# Patient Record
Sex: Female | Born: 1935 | Race: White | Hispanic: No | Smoking: Never smoker
Health system: Southern US, Community
[De-identification: ages and names within clinical notes are randomized; demographics above are authoritative.]

## PROBLEM LIST (undated history)

## (undated) DIAGNOSIS — K279 Peptic ulcer, site unspecified, unspecified as acute or chronic, without hemorrhage or perforation: Secondary | ICD-10-CM

## (undated) DIAGNOSIS — D126 Benign neoplasm of colon, unspecified: Secondary | ICD-10-CM

## (undated) DIAGNOSIS — Z7901 Long term (current) use of anticoagulants: Secondary | ICD-10-CM

## (undated) DIAGNOSIS — Z9289 Personal history of other medical treatment: Secondary | ICD-10-CM

## (undated) DIAGNOSIS — M159 Polyosteoarthritis, unspecified: Secondary | ICD-10-CM

## (undated) DIAGNOSIS — K449 Diaphragmatic hernia without obstruction or gangrene: Secondary | ICD-10-CM

## (undated) DIAGNOSIS — R413 Other amnesia: Secondary | ICD-10-CM

## (undated) DIAGNOSIS — M810 Age-related osteoporosis without current pathological fracture: Secondary | ICD-10-CM

## (undated) DIAGNOSIS — Z87442 Personal history of urinary calculi: Secondary | ICD-10-CM

## (undated) DIAGNOSIS — IMO0002 Reserved for concepts with insufficient information to code with codable children: Secondary | ICD-10-CM

## (undated) DIAGNOSIS — I4510 Unspecified right bundle-branch block: Secondary | ICD-10-CM

## (undated) DIAGNOSIS — O288 Other abnormal findings on antenatal screening of mother: Secondary | ICD-10-CM

## (undated) DIAGNOSIS — K222 Esophageal obstruction: Secondary | ICD-10-CM

## (undated) DIAGNOSIS — I10 Essential (primary) hypertension: Secondary | ICD-10-CM

## (undated) DIAGNOSIS — R609 Edema, unspecified: Secondary | ICD-10-CM

## (undated) DIAGNOSIS — R001 Bradycardia, unspecified: Secondary | ICD-10-CM

## (undated) DIAGNOSIS — D649 Anemia, unspecified: Secondary | ICD-10-CM

## (undated) DIAGNOSIS — N189 Chronic kidney disease, unspecified: Secondary | ICD-10-CM

## (undated) DIAGNOSIS — I480 Paroxysmal atrial fibrillation: Secondary | ICD-10-CM

## (undated) DIAGNOSIS — R809 Proteinuria, unspecified: Secondary | ICD-10-CM

## (undated) DIAGNOSIS — R197 Diarrhea, unspecified: Secondary | ICD-10-CM

## (undated) DIAGNOSIS — E079 Disorder of thyroid, unspecified: Secondary | ICD-10-CM

## (undated) DIAGNOSIS — I442 Atrioventricular block, complete: Secondary | ICD-10-CM

## (undated) DIAGNOSIS — R011 Cardiac murmur, unspecified: Secondary | ICD-10-CM

## (undated) DIAGNOSIS — Z9889 Other specified postprocedural states: Secondary | ICD-10-CM

## (undated) DIAGNOSIS — K219 Gastro-esophageal reflux disease without esophagitis: Secondary | ICD-10-CM

## (undated) DIAGNOSIS — K589 Irritable bowel syndrome without diarrhea: Secondary | ICD-10-CM

## (undated) DIAGNOSIS — I4891 Unspecified atrial fibrillation: Secondary | ICD-10-CM

## (undated) DIAGNOSIS — K859 Acute pancreatitis without necrosis or infection, unspecified: Secondary | ICD-10-CM

## (undated) DIAGNOSIS — M549 Dorsalgia, unspecified: Secondary | ICD-10-CM

## (undated) DIAGNOSIS — E785 Hyperlipidemia, unspecified: Secondary | ICD-10-CM

## (undated) HISTORY — DX: Gastro-esophageal reflux disease without esophagitis: K21.9

## (undated) HISTORY — DX: Age-related osteoporosis without current pathological fracture: M81.0

## (undated) HISTORY — DX: Proteinuria, unspecified: R80.9

## (undated) HISTORY — DX: Disorder of thyroid, unspecified: E07.9

## (undated) HISTORY — DX: Dorsalgia, unspecified: M54.9

## (undated) HISTORY — DX: Diarrhea, unspecified: R19.7

## (undated) HISTORY — DX: Polyosteoarthritis, unspecified: M15.9

## (undated) HISTORY — DX: Long term (current) use of anticoagulants: Z79.01

## (undated) HISTORY — DX: Other amnesia: R41.3

## (undated) HISTORY — PX: APPENDECTOMY: SHX54

## (undated) HISTORY — PX: OTHER SURGICAL HISTORY: SHX169

## (undated) HISTORY — DX: Chronic kidney disease, unspecified: N18.9

## (undated) HISTORY — PX: CATARACT EXTRACTION: SUR2

## (undated) HISTORY — DX: Essential (primary) hypertension: I10

## (undated) HISTORY — PX: TONSILLECTOMY AND ADENOIDECTOMY: SHX28

## (undated) HISTORY — DX: Hyperlipidemia, unspecified: E78.5

## (undated) HISTORY — DX: Personal history of other medical treatment: Z92.89

## (undated) HISTORY — DX: Other abnormal findings on antenatal screening of mother: O28.8

## (undated) HISTORY — DX: Diaphragmatic hernia without obstruction or gangrene: K44.9

## (undated) HISTORY — DX: Benign neoplasm of colon, unspecified: D12.6

## (undated) HISTORY — DX: Unspecified right bundle-branch block: I45.10

## (undated) HISTORY — DX: Acute pancreatitis without necrosis or infection, unspecified: K85.90

## (undated) HISTORY — DX: Anemia, unspecified: D64.9

## (undated) HISTORY — DX: Atrioventricular block, complete: I44.2

## (undated) HISTORY — DX: Unspecified atrial fibrillation: I48.91

## (undated) HISTORY — DX: Paroxysmal atrial fibrillation: I48.0

## (undated) HISTORY — DX: Other specified postprocedural states: Z98.890

## (undated) HISTORY — DX: Esophageal obstruction: K22.2

## (undated) HISTORY — DX: Bradycardia, unspecified: R00.1

## (undated) HISTORY — DX: Irritable bowel syndrome, unspecified: K58.9

## (undated) HISTORY — DX: Personal history of urinary calculi: Z87.442

## (undated) HISTORY — DX: Edema, unspecified: R60.9

## (undated) HISTORY — DX: Cardiac murmur, unspecified: R01.1

## (undated) HISTORY — DX: Reserved for concepts with insufficient information to code with codable children: IMO0002

## (undated) HISTORY — DX: Peptic ulcer, site unspecified, unspecified as acute or chronic, without hemorrhage or perforation: K27.9

---

## 2000-01-08 ENCOUNTER — Ambulatory Visit (HOSPITAL_COMMUNITY): Admission: RE | Admit: 2000-01-08 | Discharge: 2000-01-08 | Payer: Self-pay | Admitting: Family Medicine

## 2000-07-12 ENCOUNTER — Other Ambulatory Visit: Admission: RE | Admit: 2000-07-12 | Discharge: 2000-07-12 | Payer: Self-pay | Admitting: Family Medicine

## 2000-07-19 ENCOUNTER — Encounter: Admission: RE | Admit: 2000-07-19 | Discharge: 2000-10-17 | Payer: Self-pay | Admitting: Family Medicine

## 2004-02-18 ENCOUNTER — Ambulatory Visit (HOSPITAL_COMMUNITY): Admission: RE | Admit: 2004-02-18 | Discharge: 2004-02-18 | Payer: Self-pay | Admitting: Internal Medicine

## 2004-02-18 ENCOUNTER — Encounter: Payer: Self-pay | Admitting: Internal Medicine

## 2004-02-18 ENCOUNTER — Encounter (INDEPENDENT_AMBULATORY_CARE_PROVIDER_SITE_OTHER): Payer: Self-pay | Admitting: *Deleted

## 2004-04-02 ENCOUNTER — Encounter: Admission: RE | Admit: 2004-04-02 | Discharge: 2004-04-02 | Payer: Self-pay | Admitting: Family Medicine

## 2004-05-18 ENCOUNTER — Encounter: Payer: Self-pay | Admitting: Internal Medicine

## 2004-05-18 ENCOUNTER — Ambulatory Visit (HOSPITAL_COMMUNITY): Admission: RE | Admit: 2004-05-18 | Discharge: 2004-05-18 | Payer: Self-pay | Admitting: Internal Medicine

## 2004-12-04 ENCOUNTER — Ambulatory Visit: Payer: Self-pay | Admitting: Family Medicine

## 2004-12-17 ENCOUNTER — Encounter: Admission: RE | Admit: 2004-12-17 | Discharge: 2004-12-17 | Payer: Self-pay | Admitting: Family Medicine

## 2004-12-17 ENCOUNTER — Ambulatory Visit: Payer: Self-pay | Admitting: Family Medicine

## 2005-08-16 ENCOUNTER — Ambulatory Visit: Payer: Self-pay | Admitting: Family Medicine

## 2005-10-18 ENCOUNTER — Ambulatory Visit: Payer: Self-pay | Admitting: Family Medicine

## 2005-10-25 ENCOUNTER — Ambulatory Visit: Payer: Self-pay | Admitting: Internal Medicine

## 2005-11-05 ENCOUNTER — Encounter: Admission: RE | Admit: 2005-11-05 | Discharge: 2005-11-05 | Payer: Self-pay | Admitting: Family Medicine

## 2005-12-07 ENCOUNTER — Ambulatory Visit: Payer: Self-pay | Admitting: Family Medicine

## 2005-12-23 ENCOUNTER — Ambulatory Visit: Payer: Self-pay | Admitting: Family Medicine

## 2005-12-27 HISTORY — PX: OTHER SURGICAL HISTORY: SHX169

## 2006-03-09 ENCOUNTER — Ambulatory Visit: Payer: Self-pay | Admitting: Family Medicine

## 2006-03-30 ENCOUNTER — Ambulatory Visit: Payer: Self-pay | Admitting: Family Medicine

## 2006-06-30 ENCOUNTER — Ambulatory Visit: Payer: Self-pay | Admitting: Family Medicine

## 2006-06-30 LAB — CONVERTED CEMR LAB: Microalbumin U total vol: NORMAL mg/L

## 2006-07-14 ENCOUNTER — Ambulatory Visit (HOSPITAL_COMMUNITY): Admission: RE | Admit: 2006-07-14 | Discharge: 2006-07-14 | Payer: Self-pay | Admitting: Family Medicine

## 2006-07-14 ENCOUNTER — Encounter (INDEPENDENT_AMBULATORY_CARE_PROVIDER_SITE_OTHER): Payer: Self-pay | Admitting: *Deleted

## 2006-07-29 ENCOUNTER — Ambulatory Visit: Payer: Self-pay | Admitting: Family Medicine

## 2006-08-04 ENCOUNTER — Ambulatory Visit: Payer: Self-pay | Admitting: Family Medicine

## 2006-08-19 ENCOUNTER — Ambulatory Visit (HOSPITAL_COMMUNITY): Admission: RE | Admit: 2006-08-19 | Discharge: 2006-08-19 | Payer: Self-pay | Admitting: Family Medicine

## 2006-08-19 ENCOUNTER — Encounter: Payer: Self-pay | Admitting: Vascular Surgery

## 2006-08-22 ENCOUNTER — Ambulatory Visit: Payer: Self-pay | Admitting: Family Medicine

## 2006-10-03 ENCOUNTER — Ambulatory Visit: Payer: Self-pay | Admitting: Family Medicine

## 2006-10-03 LAB — CONVERTED CEMR LAB: Hgb A1c MFr Bld: 7 %

## 2006-10-04 DIAGNOSIS — I110 Hypertensive heart disease with heart failure: Secondary | ICD-10-CM

## 2006-10-04 DIAGNOSIS — M810 Age-related osteoporosis without current pathological fracture: Secondary | ICD-10-CM

## 2006-10-04 DIAGNOSIS — K589 Irritable bowel syndrome without diarrhea: Secondary | ICD-10-CM | POA: Insufficient documentation

## 2006-10-04 DIAGNOSIS — K279 Peptic ulcer, site unspecified, unspecified as acute or chronic, without hemorrhage or perforation: Secondary | ICD-10-CM | POA: Insufficient documentation

## 2006-10-25 ENCOUNTER — Encounter: Payer: Self-pay | Admitting: Family Medicine

## 2006-10-26 ENCOUNTER — Ambulatory Visit: Payer: Self-pay | Admitting: Family Medicine

## 2006-11-07 ENCOUNTER — Encounter: Payer: Self-pay | Admitting: Family Medicine

## 2006-11-07 ENCOUNTER — Ambulatory Visit: Payer: Self-pay | Admitting: Family Medicine

## 2006-11-07 DIAGNOSIS — M549 Dorsalgia, unspecified: Secondary | ICD-10-CM | POA: Insufficient documentation

## 2006-11-07 DIAGNOSIS — M7989 Other specified soft tissue disorders: Secondary | ICD-10-CM

## 2006-12-05 ENCOUNTER — Ambulatory Visit: Payer: Self-pay | Admitting: Family Medicine

## 2006-12-05 DIAGNOSIS — M159 Polyosteoarthritis, unspecified: Secondary | ICD-10-CM | POA: Insufficient documentation

## 2006-12-05 LAB — CONVERTED CEMR LAB
Bilirubin Urine: NEGATIVE
Glucose, Urine, Semiquant: NEGATIVE
Ketones, urine, test strip: NEGATIVE
Nitrite: NEGATIVE
Protein, U semiquant: NEGATIVE
Specific Gravity, Urine: 1.02
TSH: 3.897 microintl units/mL (ref 0.350–5.50)
Urobilinogen, UA: NEGATIVE
pH: 5.5

## 2006-12-06 ENCOUNTER — Encounter: Payer: Self-pay | Admitting: Family Medicine

## 2006-12-12 ENCOUNTER — Encounter: Payer: Self-pay | Admitting: Family Medicine

## 2006-12-13 ENCOUNTER — Encounter: Payer: Self-pay | Admitting: Family Medicine

## 2006-12-13 LAB — CONVERTED CEMR LAB
BUN: 25 mg/dL
Creatinine, Ser: 1.1 mg/dL

## 2007-01-09 ENCOUNTER — Telehealth (INDEPENDENT_AMBULATORY_CARE_PROVIDER_SITE_OTHER): Payer: Self-pay | Admitting: *Deleted

## 2007-01-09 ENCOUNTER — Ambulatory Visit: Payer: Self-pay | Admitting: Family Medicine

## 2007-01-09 DIAGNOSIS — N3020 Other chronic cystitis without hematuria: Secondary | ICD-10-CM

## 2007-01-17 ENCOUNTER — Ambulatory Visit: Payer: Self-pay | Admitting: Cardiology

## 2007-01-17 ENCOUNTER — Telehealth (INDEPENDENT_AMBULATORY_CARE_PROVIDER_SITE_OTHER): Payer: Self-pay | Admitting: *Deleted

## 2007-01-20 ENCOUNTER — Ambulatory Visit (HOSPITAL_COMMUNITY): Admission: RE | Admit: 2007-01-20 | Discharge: 2007-01-20 | Payer: Self-pay | Admitting: Family Medicine

## 2007-01-20 ENCOUNTER — Encounter: Payer: Self-pay | Admitting: Cardiology

## 2007-01-23 ENCOUNTER — Ambulatory Visit: Payer: Self-pay | Admitting: Family Medicine

## 2007-01-23 LAB — CONVERTED CEMR LAB: Hgb A1c MFr Bld: 7.2 %

## 2007-01-25 ENCOUNTER — Telehealth: Payer: Self-pay | Admitting: Family Medicine

## 2007-01-31 ENCOUNTER — Telehealth: Payer: Self-pay | Admitting: Family Medicine

## 2007-02-22 ENCOUNTER — Ambulatory Visit: Payer: Self-pay | Admitting: Family Medicine

## 2007-02-22 LAB — CONVERTED CEMR LAB
Bilirubin Urine: NEGATIVE
Blood in Urine, dipstick: NEGATIVE
Glucose, Urine, Semiquant: NEGATIVE
Ketones, urine, test strip: NEGATIVE
Nitrite: NEGATIVE
Protein, U semiquant: NEGATIVE
Specific Gravity, Urine: 1.025
Urobilinogen, UA: NEGATIVE
WBC Urine, dipstick: NEGATIVE
pH: 5.5

## 2007-02-24 ENCOUNTER — Telehealth (INDEPENDENT_AMBULATORY_CARE_PROVIDER_SITE_OTHER): Payer: Self-pay | Admitting: *Deleted

## 2007-02-24 ENCOUNTER — Encounter: Payer: Self-pay | Admitting: Family Medicine

## 2007-02-24 LAB — CONVERTED CEMR LAB
ALT: 14 units/L (ref 0–35)
AST: 16 units/L (ref 0–37)
Albumin: 4.4 g/dL (ref 3.5–5.2)
Alkaline Phosphatase: 58 units/L (ref 39–117)
BUN: 36 mg/dL — ABNORMAL HIGH (ref 6–23)
Basophils Absolute: 0.1 10*3/uL (ref 0.0–0.1)
Basophils Relative: 2 % — ABNORMAL HIGH (ref 0–1)
CO2: 24 meq/L (ref 19–32)
Calcium: 9.6 mg/dL (ref 8.4–10.5)
Chloride: 107 meq/L (ref 96–112)
Creatinine, Ser: 1.36 mg/dL — ABNORMAL HIGH (ref 0.40–1.20)
Eosinophils Absolute: 0.2 10*3/uL (ref 0.0–0.7)
Eosinophils Relative: 4 % (ref 0–5)
Glucose, Bld: 127 mg/dL — ABNORMAL HIGH (ref 70–99)
HCT: 35.4 % — ABNORMAL LOW (ref 36.0–46.0)
Hemoglobin: 10.9 g/dL — ABNORMAL LOW (ref 12.0–15.0)
Lymphocytes Relative: 37 % (ref 12–46)
Lymphs Abs: 2 10*3/uL (ref 0.7–3.3)
MCHC: 30.8 g/dL (ref 30.0–36.0)
MCV: 102.9 fL — ABNORMAL HIGH (ref 78.0–100.0)
Monocytes Absolute: 0.6 10*3/uL (ref 0.2–0.7)
Monocytes Relative: 11 % (ref 3–11)
Neutro Abs: 2.6 10*3/uL (ref 1.7–7.7)
Neutrophils Relative %: 46 % (ref 43–77)
Platelets: 297 10*3/uL (ref 150–400)
Potassium: 5.1 meq/L (ref 3.5–5.3)
RBC: 3.44 M/uL — ABNORMAL LOW (ref 3.87–5.11)
RDW: 13.4 % (ref 11.5–14.0)
Sodium: 140 meq/L (ref 135–145)
Total Bilirubin: 0.6 mg/dL (ref 0.3–1.2)
Total Protein: 6.9 g/dL (ref 6.0–8.3)
WBC: 5.5 10*3/uL (ref 4.0–10.5)

## 2007-02-27 LAB — CONVERTED CEMR LAB
Folate: 8.5 ng/mL
Vitamin B-12: 257 pg/mL (ref 211–911)

## 2007-03-03 ENCOUNTER — Ambulatory Visit: Payer: Self-pay | Admitting: Family Medicine

## 2007-03-04 ENCOUNTER — Encounter: Payer: Self-pay | Admitting: Family Medicine

## 2007-03-09 ENCOUNTER — Telehealth: Payer: Self-pay | Admitting: Family Medicine

## 2007-03-15 ENCOUNTER — Ambulatory Visit: Payer: Self-pay | Admitting: Family Medicine

## 2007-03-16 ENCOUNTER — Telehealth (INDEPENDENT_AMBULATORY_CARE_PROVIDER_SITE_OTHER): Payer: Self-pay | Admitting: *Deleted

## 2007-03-24 ENCOUNTER — Encounter: Payer: Self-pay | Admitting: Family Medicine

## 2007-03-27 ENCOUNTER — Encounter: Payer: Self-pay | Admitting: Family Medicine

## 2007-03-27 DIAGNOSIS — N183 Chronic kidney disease, stage 3 (moderate): Secondary | ICD-10-CM

## 2007-03-27 LAB — CONVERTED CEMR LAB
BUN: 28 mg/dL — ABNORMAL HIGH (ref 6–23)
Chloride: 103 meq/L (ref 96–112)
Potassium: 4.6 meq/L (ref 3.5–5.3)
Sodium: 137 meq/L (ref 135–145)

## 2007-04-13 ENCOUNTER — Ambulatory Visit: Payer: Self-pay | Admitting: Family Medicine

## 2007-04-27 ENCOUNTER — Telehealth (INDEPENDENT_AMBULATORY_CARE_PROVIDER_SITE_OTHER): Payer: Self-pay | Admitting: *Deleted

## 2007-04-27 ENCOUNTER — Encounter: Payer: Self-pay | Admitting: Family Medicine

## 2007-04-27 LAB — CONVERTED CEMR LAB
BUN: 20 mg/dL (ref 6–23)
CO2: 24 meq/L (ref 19–32)
Chloride: 108 meq/L (ref 96–112)
Glucose, Bld: 178 mg/dL — ABNORMAL HIGH (ref 70–99)
Potassium: 5.2 meq/L (ref 3.5–5.3)

## 2007-04-30 ENCOUNTER — Emergency Department (HOSPITAL_COMMUNITY): Admission: EM | Admit: 2007-04-30 | Discharge: 2007-04-30 | Payer: Self-pay | Admitting: Emergency Medicine

## 2007-05-02 ENCOUNTER — Telehealth: Payer: Self-pay | Admitting: Family Medicine

## 2007-05-02 ENCOUNTER — Telehealth (INDEPENDENT_AMBULATORY_CARE_PROVIDER_SITE_OTHER): Payer: Self-pay | Admitting: *Deleted

## 2007-05-02 ENCOUNTER — Ambulatory Visit: Payer: Self-pay | Admitting: Family Medicine

## 2007-05-04 ENCOUNTER — Ambulatory Visit: Payer: Self-pay | Admitting: Family Medicine

## 2007-06-08 ENCOUNTER — Encounter: Payer: Self-pay | Admitting: Family Medicine

## 2007-06-08 ENCOUNTER — Telehealth (INDEPENDENT_AMBULATORY_CARE_PROVIDER_SITE_OTHER): Payer: Self-pay | Admitting: *Deleted

## 2007-06-21 ENCOUNTER — Telehealth (INDEPENDENT_AMBULATORY_CARE_PROVIDER_SITE_OTHER): Payer: Self-pay | Admitting: *Deleted

## 2007-07-04 ENCOUNTER — Ambulatory Visit: Payer: Self-pay | Admitting: Family Medicine

## 2007-07-04 DIAGNOSIS — E1149 Type 2 diabetes mellitus with other diabetic neurological complication: Secondary | ICD-10-CM

## 2007-07-04 DIAGNOSIS — D510 Vitamin B12 deficiency anemia due to intrinsic factor deficiency: Secondary | ICD-10-CM

## 2007-07-04 LAB — CONVERTED CEMR LAB
Hgb A1c MFr Bld: 8.2 %
Microalbumin U total vol: 30 mg/L

## 2007-07-05 ENCOUNTER — Telehealth (INDEPENDENT_AMBULATORY_CARE_PROVIDER_SITE_OTHER): Payer: Self-pay | Admitting: *Deleted

## 2007-07-05 ENCOUNTER — Encounter: Payer: Self-pay | Admitting: Family Medicine

## 2007-07-05 LAB — CONVERTED CEMR LAB: Folate: 14.1 ng/mL

## 2007-07-10 LAB — CONVERTED CEMR LAB
BUN: 21 mg/dL (ref 6–23)
CO2: 27 meq/L (ref 19–32)
Calcium: 9.2 mg/dL (ref 8.4–10.5)
Chloride: 104 meq/L (ref 96–112)
Creatinine, Ser: 1.41 mg/dL — ABNORMAL HIGH (ref 0.40–1.20)
Glucose, Bld: 175 mg/dL — ABNORMAL HIGH (ref 70–99)
HCT: 43.4 % (ref 36.0–46.0)
Hemoglobin: 12.8 g/dL (ref 12.0–15.0)
MCV: 105.3 fL — ABNORMAL HIGH (ref 78.0–100.0)
RBC: 4.12 M/uL (ref 3.87–5.11)
Total Bilirubin: 0.7 mg/dL (ref 0.3–1.2)
Vitamin B-12: >2000 pg/mL — ABNORMAL HIGH (ref 211–911)
WBC: 7.6 10*3/uL (ref 4.0–10.5)

## 2007-07-11 ENCOUNTER — Telehealth: Payer: Self-pay | Admitting: Family Medicine

## 2007-07-11 ENCOUNTER — Encounter: Payer: Self-pay | Admitting: Family Medicine

## 2007-07-11 DIAGNOSIS — I7090 Unspecified atherosclerosis: Secondary | ICD-10-CM

## 2007-07-11 DIAGNOSIS — I2510 Atherosclerotic heart disease of native coronary artery without angina pectoris: Secondary | ICD-10-CM

## 2007-07-26 ENCOUNTER — Ambulatory Visit: Payer: Self-pay | Admitting: Family Medicine

## 2007-08-15 ENCOUNTER — Encounter: Payer: Self-pay | Admitting: Family Medicine

## 2007-08-23 ENCOUNTER — Encounter: Payer: Self-pay | Admitting: Family Medicine

## 2007-08-23 ENCOUNTER — Telehealth (INDEPENDENT_AMBULATORY_CARE_PROVIDER_SITE_OTHER): Payer: Self-pay | Admitting: *Deleted

## 2007-08-23 LAB — CONVERTED CEMR LAB
Calcium: 9.1 mg/dL (ref 8.4–10.5)
Glucose, Bld: 188 mg/dL — ABNORMAL HIGH (ref 70–99)
Potassium: 4.7 meq/L (ref 3.5–5.3)
Sodium: 141 meq/L (ref 135–145)

## 2007-08-29 ENCOUNTER — Ambulatory Visit: Payer: Self-pay | Admitting: Family Medicine

## 2007-08-29 LAB — CONVERTED CEMR LAB: Blood Glucose, Fasting: 313 mg/dL

## 2007-10-30 ENCOUNTER — Ambulatory Visit: Payer: Self-pay | Admitting: Family Medicine

## 2007-10-30 LAB — CONVERTED CEMR LAB: Glucose, Bld: 289 mg/dL

## 2007-10-31 ENCOUNTER — Telehealth (INDEPENDENT_AMBULATORY_CARE_PROVIDER_SITE_OTHER): Payer: Self-pay | Admitting: *Deleted

## 2007-11-11 ENCOUNTER — Encounter: Payer: Self-pay | Admitting: Family Medicine

## 2007-11-13 ENCOUNTER — Telehealth (INDEPENDENT_AMBULATORY_CARE_PROVIDER_SITE_OTHER): Payer: Self-pay | Admitting: *Deleted

## 2007-11-13 LAB — CONVERTED CEMR LAB
Hemoglobin: 13.2 g/dL (ref 12.0–15.0)
Vitamin B-12: 404 pg/mL (ref 211–911)

## 2007-11-20 ENCOUNTER — Telehealth: Payer: Self-pay | Admitting: Family Medicine

## 2007-11-28 ENCOUNTER — Encounter: Payer: Self-pay | Admitting: Family Medicine

## 2008-01-31 ENCOUNTER — Ambulatory Visit: Payer: Self-pay | Admitting: Family Medicine

## 2008-02-13 ENCOUNTER — Encounter: Payer: Self-pay | Admitting: Family Medicine

## 2008-02-14 ENCOUNTER — Encounter: Payer: Self-pay | Admitting: Family Medicine

## 2008-02-14 LAB — CONVERTED CEMR LAB
AST: 16 units/L (ref 0–37)
Albumin: 4.4 g/dL (ref 3.5–5.2)
Alkaline Phosphatase: 55 units/L (ref 39–117)
BUN: 18 mg/dL (ref 6–23)
Calcium: 9.2 mg/dL (ref 8.4–10.5)
Chloride: 107 meq/L (ref 96–112)
Creatinine, Ser: 1.28 mg/dL — ABNORMAL HIGH (ref 0.40–1.20)
Glucose, Bld: 161 mg/dL — ABNORMAL HIGH (ref 70–99)
HDL: 38 mg/dL — ABNORMAL LOW (ref >39)
Potassium: 4.5 meq/L (ref 3.5–5.3)
Total CHOL/HDL Ratio: 3.7
Triglycerides: 149 mg/dL (ref <150)

## 2008-03-20 ENCOUNTER — Encounter: Admission: RE | Admit: 2008-03-20 | Discharge: 2008-04-17 | Payer: Self-pay | Admitting: Podiatry

## 2008-06-12 ENCOUNTER — Ambulatory Visit: Payer: Self-pay | Admitting: Family Medicine

## 2008-06-12 ENCOUNTER — Encounter: Admission: RE | Admit: 2008-06-12 | Discharge: 2008-06-12 | Payer: Self-pay | Admitting: Family Medicine

## 2008-06-12 LAB — CONVERTED CEMR LAB
Nitrite: NEGATIVE
Protein, U semiquant: 30
Urobilinogen, UA: 0.2

## 2008-06-26 ENCOUNTER — Ambulatory Visit: Payer: Self-pay | Admitting: Family Medicine

## 2008-06-26 LAB — CONVERTED CEMR LAB
Bilirubin Urine: NEGATIVE
Blood in Urine, dipstick: NEGATIVE
Glucose, Urine, Semiquant: NEGATIVE
Nitrite: NEGATIVE
Specific Gravity, Urine: 1.02
pH: 5.5

## 2008-08-29 ENCOUNTER — Ambulatory Visit: Payer: Self-pay | Admitting: Family Medicine

## 2008-09-11 ENCOUNTER — Encounter: Payer: Self-pay | Admitting: Family Medicine

## 2008-10-02 ENCOUNTER — Ambulatory Visit: Payer: Self-pay | Admitting: Family Medicine

## 2008-10-02 DIAGNOSIS — H268 Other specified cataract: Secondary | ICD-10-CM

## 2008-10-02 DIAGNOSIS — I5032 Chronic diastolic (congestive) heart failure: Secondary | ICD-10-CM

## 2008-10-02 DIAGNOSIS — R809 Proteinuria, unspecified: Secondary | ICD-10-CM | POA: Insufficient documentation

## 2008-10-02 LAB — CONVERTED CEMR LAB: Hgb A1c MFr Bld: 7.8 %

## 2008-10-04 LAB — CONVERTED CEMR LAB
Calcium: 9.3 mg/dL (ref 8.4–10.5)
Glucose, Bld: 133 mg/dL — ABNORMAL HIGH (ref 70–99)
Sodium: 145 meq/L (ref 135–145)
Vitamin B-12: 643 pg/mL (ref 211–911)

## 2008-10-08 ENCOUNTER — Encounter: Payer: Self-pay | Admitting: Family Medicine

## 2008-10-23 ENCOUNTER — Ambulatory Visit: Payer: Self-pay | Admitting: Family Medicine

## 2008-10-23 DIAGNOSIS — R413 Other amnesia: Secondary | ICD-10-CM

## 2008-10-24 ENCOUNTER — Telehealth: Payer: Self-pay | Admitting: Family Medicine

## 2008-10-28 ENCOUNTER — Telehealth: Payer: Self-pay | Admitting: Family Medicine

## 2008-10-29 DIAGNOSIS — Z860100 Personal history of colon polyps, unspecified: Secondary | ICD-10-CM | POA: Insufficient documentation

## 2008-10-29 DIAGNOSIS — K449 Diaphragmatic hernia without obstruction or gangrene: Secondary | ICD-10-CM | POA: Insufficient documentation

## 2008-10-29 DIAGNOSIS — Z8711 Personal history of peptic ulcer disease: Secondary | ICD-10-CM

## 2008-10-29 DIAGNOSIS — K222 Esophageal obstruction: Secondary | ICD-10-CM

## 2008-10-30 ENCOUNTER — Ambulatory Visit: Payer: Self-pay | Admitting: Internal Medicine

## 2008-10-31 ENCOUNTER — Encounter: Payer: Self-pay | Admitting: Family Medicine

## 2008-10-31 DIAGNOSIS — M359 Systemic involvement of connective tissue, unspecified: Secondary | ICD-10-CM | POA: Insufficient documentation

## 2008-11-04 ENCOUNTER — Telehealth (INDEPENDENT_AMBULATORY_CARE_PROVIDER_SITE_OTHER): Payer: Self-pay | Admitting: *Deleted

## 2008-11-04 ENCOUNTER — Telehealth: Payer: Self-pay | Admitting: Family Medicine

## 2008-11-04 ENCOUNTER — Ambulatory Visit: Payer: Self-pay | Admitting: Family Medicine

## 2008-11-19 ENCOUNTER — Encounter: Payer: Self-pay | Admitting: Family Medicine

## 2008-11-20 ENCOUNTER — Ambulatory Visit: Payer: Self-pay | Admitting: Family Medicine

## 2008-11-20 LAB — CONVERTED CEMR LAB
Chloride: 100 meq/L (ref 96–112)
Creatinine, Ser: 1.32 mg/dL — ABNORMAL HIGH (ref 0.40–1.20)
Sodium: 139 meq/L (ref 135–145)

## 2008-11-25 ENCOUNTER — Encounter: Payer: Self-pay | Admitting: Family Medicine

## 2008-11-26 ENCOUNTER — Encounter: Payer: Self-pay | Admitting: Family Medicine

## 2008-11-26 ENCOUNTER — Telehealth (INDEPENDENT_AMBULATORY_CARE_PROVIDER_SITE_OTHER): Payer: Self-pay | Admitting: *Deleted

## 2008-12-04 ENCOUNTER — Ambulatory Visit: Payer: Self-pay | Admitting: Internal Medicine

## 2008-12-12 ENCOUNTER — Encounter: Payer: Self-pay | Admitting: Internal Medicine

## 2008-12-27 DIAGNOSIS — Z9889 Other specified postprocedural states: Secondary | ICD-10-CM

## 2008-12-27 HISTORY — DX: Other specified postprocedural states: Z98.890

## 2008-12-27 HISTORY — PX: CARDIAC CATHETERIZATION: SHX172

## 2009-01-22 ENCOUNTER — Ambulatory Visit: Payer: Self-pay | Admitting: Family Medicine

## 2009-01-22 DIAGNOSIS — I4510 Unspecified right bundle-branch block: Secondary | ICD-10-CM

## 2009-01-27 ENCOUNTER — Encounter: Payer: Self-pay | Admitting: Family Medicine

## 2009-01-29 ENCOUNTER — Encounter: Payer: Self-pay | Admitting: Family Medicine

## 2009-02-05 ENCOUNTER — Encounter: Payer: Self-pay | Admitting: Family Medicine

## 2009-02-06 ENCOUNTER — Encounter: Payer: Self-pay | Admitting: Family Medicine

## 2009-02-07 ENCOUNTER — Encounter: Payer: Self-pay | Admitting: Family Medicine

## 2009-02-07 DIAGNOSIS — I071 Rheumatic tricuspid insufficiency: Secondary | ICD-10-CM | POA: Insufficient documentation

## 2009-02-07 DIAGNOSIS — I2721 Secondary pulmonary arterial hypertension: Secondary | ICD-10-CM | POA: Insufficient documentation

## 2009-02-26 ENCOUNTER — Ambulatory Visit: Payer: Self-pay | Admitting: Family Medicine

## 2009-02-27 ENCOUNTER — Telehealth: Payer: Self-pay | Admitting: Family Medicine

## 2009-02-27 DIAGNOSIS — K859 Acute pancreatitis without necrosis or infection, unspecified: Secondary | ICD-10-CM | POA: Insufficient documentation

## 2009-02-27 LAB — CONVERTED CEMR LAB
ALT: 13 units/L (ref 0–35)
AST: 18 units/L (ref 0–37)
Albumin: 4.7 g/dL (ref 3.5–5.2)
Band Neutrophils: 0 % (ref 0–10)
Calcium: 9.6 mg/dL (ref 8.4–10.5)
Chloride: 106 meq/L (ref 96–112)
Eosinophils Relative: 3 % (ref 0–5)
Hemoglobin: 13.2 g/dL (ref 12.0–15.0)
Lymphocytes Relative: 21 % (ref 12–46)
Lymphs Abs: 1.5 10*3/uL (ref 0.7–4.0)
MCV: 103 fL — ABNORMAL HIGH (ref 78.0–100.0)
Monocytes Relative: 8 % (ref 3–12)
Neutrophils Relative %: 65 % (ref 43–77)
Potassium: 5.4 meq/L — ABNORMAL HIGH (ref 3.5–5.3)
RBC: 3.99 M/uL (ref 3.87–5.11)
WBC: 7.1 10*3/uL (ref 4.0–10.5)

## 2009-02-28 ENCOUNTER — Encounter: Admission: RE | Admit: 2009-02-28 | Discharge: 2009-02-28 | Payer: Self-pay | Admitting: Family Medicine

## 2009-03-03 ENCOUNTER — Encounter: Payer: Self-pay | Admitting: Family Medicine

## 2009-03-04 ENCOUNTER — Encounter: Payer: Self-pay | Admitting: Family Medicine

## 2009-03-12 ENCOUNTER — Encounter: Payer: Self-pay | Admitting: Family Medicine

## 2009-06-05 ENCOUNTER — Ambulatory Visit: Payer: Self-pay | Admitting: Family Medicine

## 2009-06-24 ENCOUNTER — Ambulatory Visit: Payer: Self-pay | Admitting: Family Medicine

## 2009-06-24 ENCOUNTER — Observation Stay (HOSPITAL_COMMUNITY): Admission: EM | Admit: 2009-06-24 | Discharge: 2009-06-25 | Payer: Self-pay | Admitting: Emergency Medicine

## 2009-06-26 ENCOUNTER — Ambulatory Visit: Payer: Self-pay | Admitting: Family Medicine

## 2009-06-26 DIAGNOSIS — E039 Hypothyroidism, unspecified: Secondary | ICD-10-CM | POA: Insufficient documentation

## 2009-06-26 DIAGNOSIS — R55 Syncope and collapse: Secondary | ICD-10-CM

## 2009-08-06 ENCOUNTER — Ambulatory Visit: Payer: Self-pay | Admitting: Family Medicine

## 2009-08-07 ENCOUNTER — Encounter: Payer: Self-pay | Admitting: Family Medicine

## 2009-08-07 LAB — CONVERTED CEMR LAB
BUN: 12 mg/dL (ref 6–23)
Creatinine, Ser: 1.38 mg/dL — ABNORMAL HIGH (ref 0.40–1.20)
Direct LDL: 73 mg/dL
TSH: 9.286 microintl units/mL — ABNORMAL HIGH (ref 0.350–4.500)

## 2009-09-05 ENCOUNTER — Ambulatory Visit: Payer: Self-pay | Admitting: Family Medicine

## 2009-09-05 LAB — CONVERTED CEMR LAB: Hgb A1c MFr Bld: 8.1 %

## 2009-09-11 ENCOUNTER — Encounter: Payer: Self-pay | Admitting: Family Medicine

## 2009-09-17 ENCOUNTER — Ambulatory Visit: Payer: Self-pay | Admitting: Internal Medicine

## 2009-09-24 ENCOUNTER — Encounter: Payer: Self-pay | Admitting: Family Medicine

## 2009-09-25 LAB — CONVERTED CEMR LAB: TSH: 4.273 microintl units/mL (ref 0.350–4.500)

## 2009-10-22 ENCOUNTER — Ambulatory Visit: Payer: Self-pay | Admitting: Internal Medicine

## 2009-11-29 ENCOUNTER — Ambulatory Visit: Payer: Self-pay | Admitting: Family Medicine

## 2009-12-03 ENCOUNTER — Ambulatory Visit: Payer: Self-pay | Admitting: Family Medicine

## 2009-12-03 DIAGNOSIS — S92909A Unspecified fracture of unspecified foot, initial encounter for closed fracture: Secondary | ICD-10-CM | POA: Insufficient documentation

## 2009-12-03 DIAGNOSIS — IMO0002 Reserved for concepts with insufficient information to code with codable children: Secondary | ICD-10-CM | POA: Insufficient documentation

## 2009-12-26 ENCOUNTER — Inpatient Hospital Stay (HOSPITAL_COMMUNITY): Admission: EM | Admit: 2009-12-26 | Discharge: 2009-12-30 | Payer: Self-pay | Admitting: Emergency Medicine

## 2009-12-26 ENCOUNTER — Ambulatory Visit: Payer: Self-pay | Admitting: Family Medicine

## 2009-12-26 ENCOUNTER — Encounter: Payer: Self-pay | Admitting: Family Medicine

## 2009-12-27 DIAGNOSIS — I4891 Unspecified atrial fibrillation: Secondary | ICD-10-CM

## 2009-12-27 HISTORY — DX: Unspecified atrial fibrillation: I48.91

## 2009-12-29 ENCOUNTER — Encounter (INDEPENDENT_AMBULATORY_CARE_PROVIDER_SITE_OTHER): Payer: Self-pay | Admitting: Cardiology

## 2010-01-05 ENCOUNTER — Ambulatory Visit: Payer: Self-pay | Admitting: Family Medicine

## 2010-01-05 DIAGNOSIS — I4891 Unspecified atrial fibrillation: Secondary | ICD-10-CM

## 2010-01-05 DIAGNOSIS — N390 Urinary tract infection, site not specified: Secondary | ICD-10-CM

## 2010-01-07 ENCOUNTER — Encounter: Payer: Self-pay | Admitting: Family Medicine

## 2010-01-14 ENCOUNTER — Encounter: Payer: Self-pay | Admitting: Family Medicine

## 2010-01-21 ENCOUNTER — Encounter: Payer: Self-pay | Admitting: Family Medicine

## 2010-01-23 ENCOUNTER — Ambulatory Visit: Payer: Self-pay | Admitting: Family Medicine

## 2010-01-23 DIAGNOSIS — R5381 Other malaise: Secondary | ICD-10-CM

## 2010-01-23 DIAGNOSIS — R5383 Other fatigue: Secondary | ICD-10-CM

## 2010-01-23 DIAGNOSIS — R109 Unspecified abdominal pain: Secondary | ICD-10-CM

## 2010-01-23 LAB — CONVERTED CEMR LAB
Glucose, Urine, Semiquant: 100
Protein, U semiquant: 30
pH: 5.5

## 2010-01-24 ENCOUNTER — Encounter: Payer: Self-pay | Admitting: Family Medicine

## 2010-01-27 DIAGNOSIS — R001 Bradycardia, unspecified: Secondary | ICD-10-CM

## 2010-01-27 HISTORY — DX: Bradycardia, unspecified: R00.1

## 2010-01-27 HISTORY — PX: OTHER SURGICAL HISTORY: SHX169

## 2010-01-31 ENCOUNTER — Inpatient Hospital Stay (HOSPITAL_COMMUNITY): Admission: EM | Admit: 2010-01-31 | Discharge: 2010-02-04 | Payer: Self-pay | Admitting: Emergency Medicine

## 2010-02-26 ENCOUNTER — Encounter: Payer: Self-pay | Admitting: Family Medicine

## 2010-03-04 ENCOUNTER — Ambulatory Visit: Payer: Self-pay | Admitting: Family Medicine

## 2010-03-04 LAB — CONVERTED CEMR LAB
Hgb A1c MFr Bld: 7.7 %
Nitrite: NEGATIVE
Specific Gravity, Urine: 1.015

## 2010-04-03 ENCOUNTER — Telehealth: Payer: Self-pay | Admitting: Family Medicine

## 2010-06-15 ENCOUNTER — Ambulatory Visit: Payer: Self-pay | Admitting: Family Medicine

## 2010-06-15 DIAGNOSIS — R252 Cramp and spasm: Secondary | ICD-10-CM | POA: Insufficient documentation

## 2010-06-16 ENCOUNTER — Encounter: Payer: Self-pay | Admitting: Family Medicine

## 2010-06-16 LAB — CONVERTED CEMR LAB
BUN: 23 mg/dL (ref 6–23)
Calcium: 9.5 mg/dL (ref 8.4–10.5)
Creatinine, Ser: 1.38 mg/dL — ABNORMAL HIGH (ref 0.40–1.20)
Glucose, Bld: 119 mg/dL — ABNORMAL HIGH (ref 70–99)
Magnesium: 2.1 mg/dL (ref 1.5–2.5)
Sodium: 142 meq/L (ref 135–145)

## 2010-09-16 ENCOUNTER — Encounter: Payer: Self-pay | Admitting: Family Medicine

## 2010-10-07 ENCOUNTER — Ambulatory Visit: Payer: Self-pay | Admitting: Family Medicine

## 2010-10-07 LAB — CONVERTED CEMR LAB: Creatinine,U: 200 mg/dL

## 2010-10-08 ENCOUNTER — Encounter: Payer: Self-pay | Admitting: Family Medicine

## 2010-10-26 ENCOUNTER — Ambulatory Visit: Payer: Self-pay | Admitting: Family Medicine

## 2010-10-26 DIAGNOSIS — L97909 Non-pressure chronic ulcer of unspecified part of unspecified lower leg with unspecified severity: Secondary | ICD-10-CM

## 2010-10-26 DIAGNOSIS — I83009 Varicose veins of unspecified lower extremity with ulcer of unspecified site: Secondary | ICD-10-CM | POA: Insufficient documentation

## 2010-11-11 ENCOUNTER — Ambulatory Visit: Payer: Self-pay | Admitting: Family Medicine

## 2010-11-11 DIAGNOSIS — IMO0002 Reserved for concepts with insufficient information to code with codable children: Secondary | ICD-10-CM | POA: Insufficient documentation

## 2010-11-11 DIAGNOSIS — E1165 Type 2 diabetes mellitus with hyperglycemia: Secondary | ICD-10-CM | POA: Insufficient documentation

## 2010-11-11 LAB — CONVERTED CEMR LAB
Bilirubin Urine: NEGATIVE
Blood in Urine, dipstick: NEGATIVE
Glucose, Urine, Semiquant: NEGATIVE
Ketones, urine, test strip: NEGATIVE
Protein, U semiquant: NEGATIVE
pH: 6

## 2010-11-12 ENCOUNTER — Encounter: Payer: Self-pay | Admitting: Family Medicine

## 2010-11-18 ENCOUNTER — Encounter: Payer: Self-pay | Admitting: Family Medicine

## 2010-11-18 ENCOUNTER — Encounter: Admission: RE | Admit: 2010-11-18 | Discharge: 2010-11-18 | Payer: Self-pay | Admitting: Family Medicine

## 2010-11-18 ENCOUNTER — Telehealth: Payer: Self-pay | Admitting: Family Medicine

## 2010-11-18 LAB — CONVERTED CEMR LAB
HCT: 38.1 % (ref 36.0–46.0)
Hemoglobin: 12.8 g/dL (ref 12.0–15.0)
Lymphocytes Relative: 21 % (ref 12–46)
Lymphs Abs: 1.7 10*3/uL (ref 0.7–4.0)
Monocytes Absolute: 0.4 10*3/uL (ref 0.1–1.0)
Neutro Abs: 5.8 10*3/uL (ref 1.7–7.7)
RBC: 4.03 M/uL (ref 3.87–5.11)
WBC: 7.9 10*3/uL (ref 4.0–10.5)

## 2011-01-28 NOTE — Letter (Signed)
Summary: [facility]  [facility]   Imported By: Lanelle Bal 01/27/2010 14:30:29  _____________________________________________________________________  External Attachment:    Type:   Image     Comment:   External Document

## 2011-01-28 NOTE — Progress Notes (Signed)
Summary: Foot pain  Phone Note Call from Patient   Caller: Patient Summary of Call: Pt states she has been having more problems w/ her feet. She states they are swelling and painful. She has seen Dr. Yates Decamp in the past but feels she didn't get the care she needed. Pt would like to know if this is something you can take care of or if you can refer her to diabetic foot specialist. Please advise. Initial call taken by: Payton Spark CMA,  April 03, 2010 11:17 AM  Follow-up for Phone Call        Pls see if there is another podiatrist in [place] for her to see.  Thanks. Follow-up by: Seymour Bars DO,  April 05, 2010 7:58 PM  Additional Follow-up for Phone Call Additional follow up Details #1::        The closest office I found is in [place].  [facility]: [address] [place], [place] [address] Phone: [phone]  Fax: [phone]  Additional Follow-up by: Payton Spark CMA,  April 06, 2010 8:39 AM

## 2011-01-28 NOTE — Assessment & Plan Note (Signed)
Summary: f/u DM   Vital Signs:  Patient profile:   77 year old female Height:      68 inches Weight:      197 pounds Pulse rate:   71 / minute BP sitting:   138 / 66  (left arm) Cuff size:   regular  Vitals Entered By: Kathlene November (June 15, 2010 11:20 AM) CC: diabetes follow-up   Primary Care Provider:  Seymour Bars DO  CC:  diabetes follow-up.  History of Present Illness: 77 yo WF presents for f/u T2DM with neuropathy.  She is overdue for her eye exam.  She is seeing podiatry for her neuropathy and has had a series of injections.  She is having pains in both of her legs at night with cramping and burning pain.  Her A1C is 8.2 up from 7.7.  Her sugars are running up to 400 at night even though she reports not eating much.  Her AM fastings are 130s.  She is on Lantus 40 u at bedtime and has tried to improve use of her mealtime Novolog.  She has a hard time with sweets in her diet.      Current Medications (verified): 1)  Aspirin 81 Mg Tbec (Aspirin) .... Take 1 Tablet By Mouth Once A Day 2)  Oystercal 500 + D 500-125 Mg-Unit Tabs (Calcium Carbonate-Vitamin D) .... One By Mouth Twice Daily With Meals 3)  Hyoscyamine Sulfate Cr 0.375 Mg Cp12 (Hyoscyamine Sulfate) .... Take 1 Tablet By Mouth Once A Day 4)  Lantus Solostar 100 Unit/ml Soln (Insulin Glargine) .... 40 Units [place] Injection Qhs 5)  Metoprolol Succinate 25 Mg Xr24h-Tab (Metoprolol Succinate) .Marland Kitchen.. 1 Tab By Mouth Daily 6)  Novolog Flexpen  Soln (Insulin Aspart Soln) .... 5 Units Subcutaneous With Breakfast, 6 Units Subcutaneous With Lunch, and  8 Units Subcutaneous With Dinner 7)  Lasix 40 Mg Tabs (Furosemide) .... 1/2  Tab By Mouth Daily 8)  Pravastatin Sodium 40 Mg Tabs (Pravastatin Sodium) .Marland Kitchen.. 1 Tab By Mouth Qhs 9)  Ulticare Pen Needles 29g X 12mm  Misc (Insulin Pen Needle) .... Use As Directed 4 X A Day 10)  Easy Comfort Insulin Syringe 30g X 5/16" 0.5 Ml  Misc (Insulin Syringe-Needle U-100) .... Use As Directed 11)   Restoril 15 Mg Caps (Temazepam) .Marland Kitchen.. 1 Tab By Mouth At Bedtime As Needed Sleep 12)  Azor 10-40 Mg Tabs (Amlodipine-Olmesartan) .Marland Kitchen.. 1 Tab By Mouth Daily 13)  Omeprazole 20 Mg Cpdr (Omeprazole) .Marland Kitchen.. 1 Tab By Mouth Daily 14)  Clonidine Hcl 0.1 Mg Tabs (Clonidine Hcl) .Marland Kitchen.. 1 Tab By Mouth Q 12 Hrs 15)  Gabapentin 600 Mg Tabs (Gabapentin) .Marland Kitchen.. 1 Tab By Mouth Tid 16)  Nitro-Dur 0.4 Mg/hr Pt24 (Nitroglycerin) .Marland Kitchen.. 1 Tab By Mouth Q 5 Min X 3 As Needed Chest Pain 17)  Levothyroxine Sodium 50 Mcg Tabs (Levothyroxine Sodium) .Marland Kitchen.. 1 Tab By Mouth Daily 18)  Coumadin 5 Mg Tabs (Warfarin Sodium) .... Take 1 Tab By Mouth Once Daily As Directed 19)  Amiodarone Hcl 200 Mg Tabs (Amiodarone Hcl) .Marland Kitchen.. 1 Tab By Mouth Dialy 20)  Vicodin 5-500 Mg Tabs (Hydrocodone-Acetaminophen) .Marland Kitchen.. 1 Tab By Mouth Three Times A Day As Needed Severe Back Pain, Take With Food  Allergies (verified): 1)  ! Ace Inhibitors 2)  ! Monopril 3)  ! Bactrim  Comments:  Nurse/Medical Assistant: The patient's medications and allergies were reviewed with the patient and were updated in the Medication and Allergy Lists. Kathlene November (June 15, 2010 11:20  AM)  Past History:  Past Medical History: Reviewed history from 01/23/2010 and no changes required. hx of kidney stones Current Problems:  COLONIC POLYPS, HYPERPLASTIC, HX OF (ICD-V12.72) A. FIB (Dr Allyson Sabal) 12-2009 GASTRIC ULCER, HX OF (ICD-V12.71) Hx of ESOPHAGEAL STRICTURE (ICD-530.3) HIATAL HERNIA (ICD-553.3) Family Hx of COLON CANCER (ICD-153.9) DIARRHEA (ICD-787.91) MEMORY LOSS (ICD-780.93) OTHER CATARACT (ICD-366.8) CHRONIC DIASTOLIC HEART FAILURE (ICD-428.32) - 2-D echo 1/08 EF 60% MICROALBUMINURIA (ICD-791.0) CAD (ICD-414.00) - normal cath 2/10 by Dr. Allyson Sabal ATHEROSCLEROSIS NOS (ICD-440.9) PERNICIOUS ANEMIA (ICD-281.0) DM W/NEURO MANIFESTATIONS, TYPE II (ICD-250.60) KIDNEY DISEASE, CHRONIC, STAGE III (ICD-585.3) CYSTITIS, CHRONIC (ICD-595.2) OSTEOARTHROSIS, GENERALIZED,  MULTIPLE SITES (ICD-715.09) SWELLING, LIMB (ICD-729.81) BACK PAIN (ICD-724.5) PEPTIC ULCER DIS., UNSPEC. W/O OBSTRUCTION (ICD-533.90) SENILE OSTEOPOROSIS (ICD-733.01) IRRITABLE BOWEL SYNDROME (ICD-564.1) HYPERTENSION, BENIGN SYSTEMIC (ICD-401.1)  L foot fx (Dr Margaretha Sheffield)  Social History: Reviewed history from 03/04/2010 and no changes required. Widowed x 21 yrs.  Retired from [employer].  Has 2 daughters- one in [place] and one in [place].  Lives w/ daughter [name] [phone]) in [place] and her son in law who had a stroke.  Nonsmoker.  Denies ETOH. Trying to walk more.  Has 2 living sibblings.  Review of Systems      See HPI  Physical Exam  General:  alert, well-developed, well-nourished, well-hydrated, and overweight-appearing.   Head:  normocephalic and atraumatic.   Mouth:  dry oral mucosa Neck:  no masses.   Lungs:  Normal respiratory effort, chest expands symmetrically. Lungs are clear to auscultation, no crackles or wheezes. Heart:  RRR with 2/6 SEM Extremities:  1+ pitting bilat LE edema Skin:  color normal.   Psych:  good eye contact, not anxious appearing, and not depressed appearing.     Impression & Recommendations:  Problem # 1:  DM W/NEURO MANIFESTATIONS, TYPE II (ICD-250.60) A1C 8.2 with sugars slightly high.  Will adjust her Lantus from 40--> 47 units daily.  She has been using her Novolog with meals (sometimes).  Just had eye exam 2 wks ago.  Labs are UTD. Her updated medication list for this problem includes:    Aspirin 81 Mg Tbec (Aspirin) .Marland Kitchen... Take 1 tablet by mouth once a day    Lantus Solostar 100 Unit/ml Soln (Insulin glargine) .Marland KitchenMarland KitchenMarland KitchenMarland Kitchen 47  units [place] injection qhs    Novolog Flexpen Soln (Insulin aspart soln) .Marland KitchenMarland KitchenMarland KitchenMarland Kitchen 5 units subcutaneous with breakfast, 6 units subcutaneous with lunch, and  8 units subcutaneous with dinner    Azor 10-40 Mg Tabs (Amlodipine-olmesartan) .Marland Kitchen... 1 tab by mouth daily  Orders: Fingerstick (45409) Hemoglobin A1C (83036)  Labs  Reviewed: Creat: 1.38 (08/07/2009)   Microalbumin: 30 (09/05/2009)  Last Eye Exam: normal (09/11/2008) Reviewed HgBA1c results: 8.2 (06/15/2010)  7.7 (03/04/2010)  Problem # 2:  LEG CRAMPS, NOCTURNAL (ICD-729.82) Combination of this and diabetic neuropathy -- already on gabapentin 600 mg three times a day and seeing podiatry for injections for the neuropathy.  Will check a mag level with BMP today.  Suggest trying 8 oz of tonic water with Mag Oxide 400 mg in the evenings. Orders: T-Magnesium [phone]) T-Basic Metabolic Panel [phone])  Problem # 3:  HYPERTENSION, BENIGN SYSTEMIC (ICD-401.1) SBP a little high but given her age and low DBP, will not make any changes. Her updated medication list for this problem includes:    Metoprolol Succinate 25 Mg Xr24h-tab (Metoprolol succinate) .Marland Kitchen... 1 tab by mouth daily    Lasix 40 Mg Tabs (Furosemide) .Marland Kitchen... 1/2  tab by mouth daily    Azor 10-40 Mg Tabs (  Amlodipine-olmesartan) .Marland Kitchen... 1 tab by mouth daily    Clonidine Hcl 0.1 Mg Tabs (Clonidine hcl) .Marland Kitchen... 1 tab by mouth q 12 hrs  BP today: 138/66 Prior BP: 125/67 (03/04/2010)  Prior 10 Yr Risk Heart Disease: N/A (10/23/2008)  Labs Reviewed: K+: 5.4 (02/26/2009) Creat: : 1.38 (08/07/2009)   Chol: 139 (02/13/2008)   HDL: 38 (02/13/2008)   LDL: 71 (02/13/2008)   TG: 149 (02/13/2008)  Problem # 4:  UNSPECIFIED HYPOTHYROIDISM (ICD-244.9) Update TSH and adjust thryoid meds accordingly. Her updated medication list for this problem includes:    Levothyroxine Sodium 50 Mcg Tabs (Levothyroxine sodium) .Marland Kitchen... 1 tab by mouth daily  Orders: T-TSH ([id number])  Problem # 5:  KIDNEY DISEASE, CHRONIC, STAGE III (ICD-585.3) Stable.  Avoids NSAIDs.    Problem # 6:  COUMADIN THERAPY (ICD-V58.61) A Fib, s/p pacemaker -- doing well.  Sees the Coumadin clinic in [place] for managm,ent.  Complete Medication List: 1)  Aspirin 81 Mg Tbec (Aspirin) .... Take 1 tablet by mouth once a day 2)  Oystercal 500 +  D 500-125 Mg-unit Tabs (Calcium carbonate-vitamin d) .... One by mouth twice daily with meals 3)  Hyoscyamine Sulfate Cr 0.375 Mg Cp12 (Hyoscyamine sulfate) .... Take 1 tablet by mouth once a day 4)  Lantus Solostar 100 Unit/ml Soln (Insulin glargine) .... 47  units [place] injection qhs 5)  Metoprolol Succinate 25 Mg Xr24h-tab (Metoprolol succinate) .Marland Kitchen.. 1 tab by mouth daily 6)  Novolog Flexpen Soln (Insulin aspart soln) .... 5 units subcutaneous with breakfast, 6 units subcutaneous with lunch, and  8 units subcutaneous with dinner 7)  Lasix 40 Mg Tabs (Furosemide) .... 1/2  tab by mouth daily 8)  Pravastatin Sodium 40 Mg Tabs (Pravastatin sodium) .Marland Kitchen.. 1 tab by mouth qhs 9)  Ulticare Pen Needles 29g X 12mm Misc (Insulin pen needle) .... Use as directed 4 x a day 10)  Easy Comfort Insulin Syringe 30g X 5/16" 0.5 Ml Misc (Insulin syringe-needle u-100) .... Use as directed 11)  Restoril 15 Mg Caps (Temazepam) .Marland Kitchen.. 1 tab by mouth at bedtime as needed sleep 12)  Azor 10-40 Mg Tabs (Amlodipine-olmesartan) .Marland Kitchen.. 1 tab by mouth daily 13)  Omeprazole 20 Mg Cpdr (Omeprazole) .Marland Kitchen.. 1 tab by mouth daily 14)  Clonidine Hcl 0.1 Mg Tabs (Clonidine hcl) .Marland Kitchen.. 1 tab by mouth q 12 hrs 15)  Gabapentin 600 Mg Tabs (Gabapentin) .Marland Kitchen.. 1 tab by mouth tid 16)  Nitro-dur 0.4 Mg/hr Pt24 (Nitroglycerin) .Marland Kitchen.. 1 tab by mouth q 5 min x 3 as needed chest pain 17)  Levothyroxine Sodium 50 Mcg Tabs (Levothyroxine sodium) .Marland Kitchen.. 1 tab by mouth daily 18)  Coumadin 5 Mg Tabs (Warfarin sodium) .... Take 1 tab by mouth once daily as directed 19)  Amiodarone Hcl 200 Mg Tabs (Amiodarone hcl) .Marland Kitchen.. 1 tab by mouth dialy 20)  Vicodin 5-500 Mg Tabs (Hydrocodone-acetaminophen) .Marland Kitchen.. 1 tab by mouth three times a day as needed severe back pain, take with food  Patient Instructions: 1)  Increase Lantus to 47 units at bedtime due to A1C of 8.2. 2)  Labs downstairs today. 3)  will call you w/ results tomorrow. 4)  BP is OK. 5)  Try 8 oz of tonic water  at bedtime with Magnesium Oxide 400 mg for nighttime leg cramps. 6)  Return for f/u diabetes in 3-4 mos.  Laboratory Results   Blood Tests   Date/Time Received: 06/15/2010 Date/Time Reported: 06/15/2010  HGBA1C: 8.2%   (Normal Range: Non-Diabetic - 3-6%   Control Diabetic -  6-8%)    

## 2011-01-28 NOTE — Letter (Signed)
Summary: [facility]  [facility]   Imported By: Lanelle Bal 09/28/2010 10:36:16  _____________________________________________________________________  External Attachment:    Type:   Image     Comment:   External Document

## 2011-01-28 NOTE — Progress Notes (Signed)
Summary: Not feeling any better  Phone Note Call from Patient Call back at Home Phone [phone]   Caller: Patient Summary of Call: Pt calls and states that she is not feeling much better- still very fatigued, and when gets up to do anything says her lower back at hip bone kills her. Was told to call you if not better Initial call taken by: Kathlene November LPN,  November 18, 2010 1:07 PM  Follow-up for Phone Call        see if she can go downstairs for an L spine XRAY and a CBC today. Follow-up by: Seymour Bars DO,  November 18, 2010 1:29 PM  New Problems: FATIGUE, ACUTE (ICD-780.79)   New Problems: FATIGUE, ACUTE (ICD-780.79)  Appended Document: Not feeling any better 11/18/2010- Pt notified to come pick up orders get xray and labs. Pt states will try her best to get here to day and have done. KJ LPN

## 2011-01-28 NOTE — Assessment & Plan Note (Signed)
Summary: UTI/ flank pain   Vital Signs:  Patient profile:   77 year old female Height:      68 inches Weight:      198 pounds BMI:     30.21 O2 Sat:      97 % on Room air Temp:     97.9 degrees F oral Pulse rate:   46 / minute BP sitting:   125 / 43  (right arm) Cuff size:   regular  Vitals Entered By: Payton Spark CMA (January 23, 2010 1:37 PM)  O2 Flow:  Room air CC: Mid back pain x weeks.  Pain Assessment Patient in pain? yes      Intensity: 8   Primary Care Provider:  Seymour Bars DO  CC:  Mid back pain x weeks. Marland Kitchen  History of Present Illness: 77 yo WF presents for c/o lower thoracic and R flank pain x 5 days.  She has had pain when she gets out of the bed.  She is taking Tylenol #3 for pain which is not doing much for her pain.  She is seeing the coumadin clinic and seeing Dr Allyson Sabal for some brachycardia from the meds.  He did not change her meds.  She is always tired.    She is having dysuria and increased frequency.  Denies abdominal pain, fevers or chills.  Had a UTI in the hospital last month.  Current Medications (verified): 1)  Aspirin 81 Mg Tbec (Aspirin) .... Take 1 Tablet By Mouth Once A Day 2)  Oystercal 500 + D 500-125 Mg-Unit Tabs (Calcium Carbonate-Vitamin D) .... One By Mouth Twice Daily With Meals 3)  Hyoscyamine Sulfate Cr 0.375 Mg Cp12 (Hyoscyamine Sulfate) .... Take 1 Tablet By Mouth Once A Day 4)  Lantus Solostar 100 Unit/ml Soln (Insulin Glargine) .... 40 Units [place] Injection Qhs 5)  Metoprolol Tartrate 50 Mg Tabs (Metoprolol Tartrate) .Marland Kitchen.. 1 Tab By Mouth Daily 6)  Novolog Flexpen  Soln (Insulin Aspart Soln) .... 5 Units Subcutaneous With Breakfast, 6 Units Subcutaneous With Lunch, and  8 Units Subcutaneous With Dinner 7)  Lasix 40 Mg Tabs (Furosemide) .... 1/2  Tab By Mouth Daily 8)  Pravastatin Sodium 40 Mg Tabs (Pravastatin Sodium) .Marland Kitchen.. 1 Tab By Mouth Qhs 9)  Ulticare Pen Needles 29g X 12mm  Misc (Insulin Pen Needle) .... Use As Directed 4 X A  Day 10)  Easy Comfort Insulin Syringe 30g X 5/16" 0.5 Ml  Misc (Insulin Syringe-Needle U-100) .... Use As Directed 11)  Restoril 15 Mg Caps (Temazepam) .Marland Kitchen.. 1 Tab By Mouth At Bedtime As Needed Sleep 12)  Azor 10-40 Mg Tabs (Amlodipine-Olmesartan) .Marland Kitchen.. 1 Tab By Mouth Daily 13)  Omeprazole 20 Mg Cpdr (Omeprazole) .Marland Kitchen.. 1 Tab By Mouth Daily 14)  Clonidine Hcl 0.1 Mg Tabs (Clonidine Hcl) .Marland Kitchen.. 1 Tab By Mouth Q 12 Hrs 15)  Gabapentin 600 Mg Tabs (Gabapentin) .Marland Kitchen.. 1 Tab By Mouth Tid 16)  Nitro-Dur 0.4 Mg/hr Pt24 (Nitroglycerin) .Marland Kitchen.. 1 Tab By Mouth Q 5 Min X 3 As Needed Chest Pain 17)  Levothyroxine Sodium 50 Mcg Tabs (Levothyroxine Sodium) .Marland Kitchen.. 1 Tab By Mouth Daily 18)  Acetaminophen-Codeine #3 300-30 Mg Tabs (Acetaminophen-Codeine) .... Take 1 Tab By Mouth Every Three Times A Day As Needed For Pain 19)  Coumadin 5 Mg Tabs (Warfarin Sodium) .... Take 1 Tab By Mouth Once Daily As Directed 20)  Amiodarone Hcl 200 Mg Tabs (Amiodarone Hcl) .Marland Kitchen.. 1 Tab By Mouth Dialy 21)  Warfarin Sodium 5 Mg Tabs (  Warfarin Sodium) .Marland Kitchen.. 1 Tab By Mouth Qpm  Allergies (verified): 1)  ! Ace Inhibitors 2)  ! Monopril 3)  ! Bactrim  Past History:  Past Medical History: hx of kidney stones Current Problems:  COLONIC POLYPS, HYPERPLASTIC, HX OF (ICD-V12.72) A. FIB (Dr Allyson Sabal) 12-2009 GASTRIC ULCER, HX OF (ICD-V12.71) Hx of ESOPHAGEAL STRICTURE (ICD-530.3) HIATAL HERNIA (ICD-553.3) Family Hx of COLON CANCER (ICD-153.9) DIARRHEA (ICD-787.91) MEMORY LOSS (ICD-780.93) OTHER CATARACT (ICD-366.8) CHRONIC DIASTOLIC HEART FAILURE (ICD-428.32) - 2-D echo 1/08 EF 60% MICROALBUMINURIA (ICD-791.0) CAD (ICD-414.00) - normal cath 2/10 by Dr. Allyson Sabal ATHEROSCLEROSIS NOS (ICD-440.9) PERNICIOUS ANEMIA (ICD-281.0) DM W/NEURO MANIFESTATIONS, TYPE II (ICD-250.60) KIDNEY DISEASE, CHRONIC, STAGE III (ICD-585.3) CYSTITIS, CHRONIC (ICD-595.2) OSTEOARTHROSIS, GENERALIZED, MULTIPLE SITES (ICD-715.09) SWELLING, LIMB (ICD-729.81) BACK PAIN  (ICD-724.5) PEPTIC ULCER DIS., UNSPEC. W/O OBSTRUCTION (ICD-533.90) SENILE OSTEOPOROSIS (ICD-733.01) IRRITABLE BOWEL SYNDROME (ICD-564.1) HYPERTENSION, BENIGN SYSTEMIC (ICD-401.1)  L foot fx (Dr Margaretha Sheffield)  Social History: Reviewed history from 06/24/2009 and no changes required. Widowed x 18 yrs.  Retired from [employer].  Has 2 daughters- one in [place] and one in [place].  Lives w/ daughter [name] [phone]) in [place] and her son in law who had a stroke.  Nonsmoker.  Denies ETOH. Trying to walk more.  Has 2 living sibblings.  Review of Systems      See HPI  Physical Exam  General:  alert, well-developed, well-nourished, well-hydrated, and overweight-appearing.   Head:  normocephalic and atraumatic.   Eyes:  sclera non icteric Nose:  no nasal discharge.   Mouth:  pharynx pink and moist.   Neck:  no masses.   Lungs:  Normal respiratory effort, chest expands symmetrically. Lungs are clear to auscultation, no crackles or wheezes. Heart:  RRR with 2/6 SEM Abdomen:  soft and non-tender.   Msk:  tender over R thoracic/ flank region Extremities:  1+pitting RLE edema cast on LLE Skin:  color normal.   Psych:  good eye contact, not anxious appearing, and not depressed appearing.     Impression & Recommendations:  Problem # 1:  UTI (ICD-599.0) UA + for infection today. Due to stage 2 CKD and coumadin use, I've chosen Keflex to treat her.  Sent urine for cx.  F/U results on Monday. Hemodynamically stable other than bradycardia from metoprolol use. Her updated medication list for this problem includes:    Keflex 500 Mg Caps (Cephalexin) .Marland Kitchen... 1 capsule by mouth three times a day x 7 days  Orders: UA Dipstick w/o Micro (automated)  (81003) T-Culture, Urine ([id number])  Encouraged to push clear liquids, get enough rest, and take acetaminophen as needed. To be seen in 10 days if no improvement, sooner if worse.  Problem # 2:  FLANK PAIN, RIGHT (ICD-789.09) Likely due to UTI given  abrupt onset and severity.  She has Tylenol #3 for pain.  Cannot take NSAIDs due to coumadin use. Should resolve with UTI treatment. Her updated medication list for this problem includes:    Aspirin 81 Mg Tbec (Aspirin) .Marland Kitchen... Take 1 tablet by mouth once a day    Acetaminophen-codeine #3 300-30 Mg Tabs (Acetaminophen-codeine) .Marland Kitchen... Take 1 tab by mouth every three times a day as needed for pain  Problem # 3:  FATIGUE, CHRONIC (ICD-780.79) Chronic fatigue even from prior to hospital stay for new onset A. Fib. This may be SE from meds, bradycardia, current UTI, CHF, etc. I will send copy of note back to Dr Allyson Sabal to address her bradycardia.   Complete Medication List: 1)  Aspirin 81 Mg Tbec (  Aspirin) .... Take 1 tablet by mouth once a day 2)  Oystercal 500 + D 500-125 Mg-unit Tabs (Calcium carbonate-vitamin d) .... One by mouth twice daily with meals 3)  Hyoscyamine Sulfate Cr 0.375 Mg Cp12 (Hyoscyamine sulfate) .... Take 1 tablet by mouth once a day 4)  Lantus Solostar 100 Unit/ml Soln (Insulin glargine) .... 40 units [place] injection qhs 5)  Metoprolol Tartrate 50 Mg Tabs (Metoprolol tartrate) .Marland Kitchen.. 1 tab by mouth daily 6)  Novolog Flexpen Soln (Insulin aspart soln) .... 5 units subcutaneous with breakfast, 6 units subcutaneous with lunch, and  8 units subcutaneous with dinner 7)  Lasix 40 Mg Tabs (Furosemide) .... 1/2  tab by mouth daily 8)  Pravastatin Sodium 40 Mg Tabs (Pravastatin sodium) .Marland Kitchen.. 1 tab by mouth qhs 9)  Ulticare Pen Needles 29g X 12mm Misc (Insulin pen needle) .... Use as directed 4 x a day 10)  Easy Comfort Insulin Syringe 30g X 5/16" 0.5 Ml Misc (Insulin syringe-needle u-100) .... Use as directed 11)  Restoril 15 Mg Caps (Temazepam) .Marland Kitchen.. 1 tab by mouth at bedtime as needed sleep 12)  Azor 10-40 Mg Tabs (Amlodipine-olmesartan) .Marland Kitchen.. 1 tab by mouth daily 13)  Omeprazole 20 Mg Cpdr (Omeprazole) .Marland Kitchen.. 1 tab by mouth daily 14)  Clonidine Hcl 0.1 Mg Tabs (Clonidine hcl) .Marland Kitchen.. 1 tab by  mouth q 12 hrs 15)  Gabapentin 600 Mg Tabs (Gabapentin) .Marland Kitchen.. 1 tab by mouth tid 16)  Nitro-dur 0.4 Mg/hr Pt24 (Nitroglycerin) .Marland Kitchen.. 1 tab by mouth q 5 min x 3 as needed chest pain 17)  Levothyroxine Sodium 50 Mcg Tabs (Levothyroxine sodium) .Marland Kitchen.. 1 tab by mouth daily 18)  Acetaminophen-codeine #3 300-30 Mg Tabs (Acetaminophen-codeine) .... Take 1 tab by mouth every three times a day as needed for pain 19)  Coumadin 5 Mg Tabs (Warfarin sodium) .... Take 1 tab by mouth once daily as directed 20)  Amiodarone Hcl 200 Mg Tabs (Amiodarone hcl) .Marland Kitchen.. 1 tab by mouth dialy 21)  Warfarin Sodium 5 Mg Tabs (Warfarin sodium) .Marland Kitchen.. 1 tab by mouth qpm 22)  Keflex 500 Mg Caps (Cephalexin) .Marland Kitchen.. 1 capsule by mouth three times a day x 7 days  Patient Instructions: 1)  Start on Keflex.  Take 3 x a day for UTI. 2)  Urine sent for culture.  Will be back on Monday. 3)  Use Tylenol #3 for comfort. Prescriptions: KEFLEX 500 MG CAPS (CEPHALEXIN) 1 capsule by mouth three times a day x 7 days  #21 x 0   Entered and Authorized by:   Seymour Bars DO   Signed by:   Seymour Bars DO on 01/23/2010   Method used:   Electronically to        [facility] [phone]* (retail)       [address] [place], [place]  [address]       Ph: [phone]       Fax: [phone]   RxID:   [id number]   Laboratory Results   Urine Tests    Routine Urinalysis   Color: yellow Appearance: Clear Glucose: 100   (Normal Range: Negative) Bilirubin: small   (Normal Range: Negative) Ketone: small (15)   (Normal Range: Negative) Spec. Gravity: 1.020   (Normal Range: 1.003-1.035) Blood: small   (Normal Range: Negative) pH: 5.5   (Normal Range: 5.0-8.0) Protein: 30   (Normal Range: Negative) Urobilinogen: 1.0   (Normal Range: 0-1) Nitrite: negative   (Normal Range: Negative) Leukocyte Esterace:  small   (Normal Range: Negative)

## 2011-01-28 NOTE — Assessment & Plan Note (Signed)
Summary: venous ulcer/ A Fib   Vital Signs:  Patient profile:   77 year old female Height:      68 inches Weight:      195 pounds BMI:     29.76 O2 Sat:      96 % on Room air Temp:     98.4 degrees F oral Pulse rate:   89 / minute BP sitting:   123 / 72  (left arm) Cuff size:   regular  Vitals Entered By: Payton Spark CMA (October 26, 2010 1:04 PM)  O2 Flow:  Room air CC: L foot so swollen last week started bleeding and leaking fluid over the weekend.    Primary Care Josemanuel Eakins:  Seymour Bars DO  CC:  L foot so swollen last week started bleeding and leaking fluid over the weekend. .  History of Present Illness: 77 yo WF presents for problems with leakage of clear bloody fluid from the top of her L foot 3 days ago.  She denies hitting it or scratching her foot.  She washed it with warm soapy water and used topical neosporin and dressing it.  She has been elevating her foot.  It has improved.  The swelling has improved.  She is taking 1/2 tab of Lasix every other day.  She refuses to take it everyday due to leg cramps.  She had a bit of heart palpitations yesterday before going to church yesterday.  She sat down and rested.  The fluttering lasted for about 20 min and resolved on its own.  She has had more fatigue.  She had a bit of chest tightness w/ the palpitations.    Allergies (verified): 1)  ! Ace Inhibitors 2)  ! Monopril 3)  ! Bactrim  Past History:  Past Medical History: Reviewed history from 01/23/2010 and no changes required. hx of kidney stones Current Problems:  COLONIC POLYPS, HYPERPLASTIC, HX OF (ICD-V12.72) A. FIB (Dr Allyson Sabal) 12-2009 GASTRIC ULCER, HX OF (ICD-V12.71) Hx of ESOPHAGEAL STRICTURE (ICD-530.3) HIATAL HERNIA (ICD-553.3) Family Hx of COLON CANCER (ICD-153.9) DIARRHEA (ICD-787.91) MEMORY LOSS (ICD-780.93) OTHER CATARACT (ICD-366.8) CHRONIC DIASTOLIC HEART FAILURE (ICD-428.32) - 2-D echo 1/08 EF 60% MICROALBUMINURIA (ICD-791.0) CAD (ICD-414.00) -  normal cath 2/10 by Dr. Allyson Sabal ATHEROSCLEROSIS NOS (ICD-440.9) PERNICIOUS ANEMIA (ICD-281.0) DM W/NEURO MANIFESTATIONS, TYPE II (ICD-250.60) KIDNEY DISEASE, CHRONIC, STAGE III (ICD-585.3) CYSTITIS, CHRONIC (ICD-595.2) OSTEOARTHROSIS, GENERALIZED, MULTIPLE SITES (ICD-715.09) SWELLING, LIMB (ICD-729.81) BACK PAIN (ICD-724.5) PEPTIC ULCER DIS., UNSPEC. W/O OBSTRUCTION (ICD-533.90) SENILE OSTEOPOROSIS (ICD-733.01) IRRITABLE BOWEL SYNDROME (ICD-564.1) HYPERTENSION, BENIGN SYSTEMIC (ICD-401.1)  L foot fx (Dr Margaretha Sheffield)  Past Surgical History: Reviewed history from 03/04/2010 and no changes required. 2D echo- LVEF 55-60% 1-08  abnml LV relaxation, mild AVR, RV mildly dilated  Appendectomy  carotid dopplers- no significant stenosis - 2007  EGD-- esophageal stricture, gastric ulcer  T&A moderate calcification of the right coronary and left anterior descending arteries.  On CT scan 03/2007  normal heart cath 01/2009 Dr Allyson Sabal. Cataract Extraction- Both eyes pacemaker placement Feb 2011  Social History: Reviewed history from 03/04/2010 and no changes required. Widowed x 21 yrs.  Retired from [employer].  Has 2 daughters- one in [place] and one in [place].  Lives w/ daughter [name] [phone]) in [place] and her son in law who had a stroke.  Nonsmoker.  Denies ETOH. Trying to walk more.  Has 2 living sibblings.  Review of Systems      See HPI  Physical Exam  General:  alert, well-developed, well-nourished, well-hydrated, and overweight-appearing.  Head:  normocephalic and atraumatic.   Mouth:  pharynx pink and moist.   Lungs:  Normal respiratory effort, chest expands symmetrically. Lungs are clear to auscultation, no crackles or wheezes. Heart:  RRR with 2/6 SEM Pulses:  2+ bilat pedal pulses Extremities:  1+ LE edema, non pitting bilat LEs with 2 dorsal L foot 0.5 cm superfifical ulcers with a clean pick base and scant serosanguinous drainage.  Minimal erythema around the medial one.  non  tender.     Impression & Recommendations:  Problem # 1:  VENOUS STASIS ULCER (ICD-454.0) L mild venous stasis ulcer, small.  Will treat with leg elevation w/ use of compression hose, Lasix for swelling. Use warm soapy soaks 10 min 2 x a day with topical Bactroban ointment two times a day and a dressing. Avoid scratching or high friction shoes. Call if pain or redness worsens, purulent drainage, higher sugars or fevers occur.   Recheck in 2 wks.  Problem # 2:  ATRIAL FIBRILLATION (ICD-427.31) I reviewed her notes from Dr Allyson Sabal.  She is on coumadin and a BB for rate control.  According to Dr Allyson Sabal, she is supposed to be on Amiodorone which she is NOT taking.  This would explain her palpitations x 20 min yesterday, likely went into A Fib and converted back to NSR. She has had increased fatigue but it appears that her Echo is UTD and she has a normal lung exam and pulse ox today.   Her updated medication list for this problem includes:    Aspirin 81 Mg Tbec (Aspirin) .Marland Kitchen... Take 1 tablet by mouth once a day    Metoprolol Succinate 25 Mg Xr24h-tab (Metoprolol succinate) .Marland Kitchen... 1 tab by mouth daily    Azor 10-40 Mg Tabs (Amlodipine-olmesartan) .Marland Kitchen... 1 tab by mouth daily    Coumadin 5 Mg Tabs (Warfarin sodium) .Marland Kitchen... Take 1 tab by mouth once daily as directed  Complete Medication List: 1)  Aspirin 81 Mg Tbec (Aspirin) .... Take 1 tablet by mouth once a day 2)  Oystercal 500 + D 500-125 Mg-unit Tabs (Calcium carbonate-vitamin d) .... One by mouth twice daily with meals 3)  Hyoscyamine Sulfate Cr 0.375 Mg Cp12 (Hyoscyamine sulfate) .... Take 1 tablet by mouth once a day 4)  Lantus Solostar 100 Unit/ml Soln (Insulin glargine) .... 30  units [place] injection qhs 5)  Metoprolol Succinate 25 Mg Xr24h-tab (Metoprolol succinate) .Marland Kitchen.. 1 tab by mouth daily 6)  Novolog Flexpen Soln (Insulin aspart soln) .... 5 units subcutaneous with breakfast, 6 units subcutaneous with lunch, and  8 units subcutaneous with  dinner 7)  Lasix 40 Mg Tabs (Furosemide) .... 1/2  tab by mouth daily 8)  Pravastatin Sodium 40 Mg Tabs (Pravastatin sodium) .Marland Kitchen.. 1 tab by mouth qhs 9)  Ulticare Pen Needles 29g X 12mm Misc (Insulin pen needle) .... Use as directed 4 x a day 10)  Easy Comfort Insulin Syringe 30g X 5/16" 0.5 Ml Misc (Insulin syringe-needle u-100) .... Use as directed 11)  Restoril 15 Mg Caps (Temazepam) .Marland Kitchen.. 1 tab by mouth at bedtime as needed sleep 12)  Azor 10-40 Mg Tabs (Amlodipine-olmesartan) .Marland Kitchen.. 1 tab by mouth daily 13)  Pantoprazole Sodium 40 Mg Tbec (Pantoprazole sodium) .... Take 1 tab by mouth once daily 14)  Clonidine Hcl 0.1 Mg Tabs (Clonidine hcl) .Marland Kitchen.. 1 tab by mouth q 12 hrs 15)  Gabapentin 600 Mg Tabs (Gabapentin) .Marland Kitchen.. 1 tab by mouth tid 16)  Nitro-dur 0.4 Mg/hr Pt24 (Nitroglycerin) .Marland Kitchen.. 1 tab by  mouth q 5 min x 3 as needed chest pain 17)  Levothroid 75 Mcg Tabs (Levothyroxine sodium) .Marland Kitchen.. 1 tab by mouth daily 18)  Coumadin 5 Mg Tabs (Warfarin sodium) .... Take 1 tab by mouth once daily as directed 19)  Hydrocodone-acetaminophen 5-325 Mg Tabs (Hydrocodone-acetaminophen) .Marland Kitchen.. 1 tab by mouth q 6 hrs as needed back pain; take with food 20)  Mupirocin 2 % Oint (Mupirocin) .... Apply to wound on foot two times a day x 2 wks.  Patient Instructions: 1)  For foot wound (secondary to venous stasis): 2)  Elevate legs (higher, the better). 3)  Take your lasix everyday - every other day. 4)  Clean with warm, soapy water ( soak x 10 min) morning and night. 5)  Dry off and apply mucipiron ointment and a bandage. 6)  Avoid shoes that rub and avoid scratching. 7)  Call if redness, pain, pus worsens. 8)  I will contact Dr Allyson Sabal about your fatigue and ? Amiodorone use. 9)  Return to recheck wound in 2 wks. Prescriptions: MUPIROCIN 2 % OINT (MUPIROCIN) apply to wound on foot two times a day x 2 wks.  #1 tube x 0   Entered and Authorized by:   Seymour Bars DO   Signed by:   Seymour Bars DO on 10/26/2010    Method used:   Electronically to        [facility] [phone]* (retail)       [address] [place], [place]  [address]       Ph: [phone]       Fax: [phone]   RxID:   [id number]    Orders Added: 1)  Est. Patient Level IV [38756]

## 2011-01-28 NOTE — Assessment & Plan Note (Signed)
Summary: HFU Atrial Fib   Vital Signs:  Patient profile:   77 year old female Height:      68 inches Weight:      193 pounds BMI:     29.45 O2 Sat:      95 % on Room air Temp:     98.1 degrees F oral Pulse rate:   78 / minute BP sitting:   154 / 66  (left arm) Cuff size:   regular  Vitals Entered By: Payton Spark CMA (January 05, 2010 9:34 AM)  O2 Flow:  Room air CC: Hosp F/U.    Primary Care Provider:  Seymour Bars DO  CC:  Hosp F/U. Marland Kitchen  History of Present Illness: 77 yo WF presents for HFU visit.  She was admitted to [facility] from 12-30 to 1-3 for new onset A. Fib.  She woke up dizzy that morning.  She had a syncopal episdode and was witnessed by her son in law.  She was cardioverted and seen by Dr Allyson Sabal on the 3rd after having a normal TEE.  She successfully cardioverted to NSR with 1st deg AVB and was started on Coumadin and amiodorone.  She went to the coumadin clinic at [facility] on the 5th and her dose was increased.  She is due to go back in 2 days.  She has some large bruises from IVs/ blood draws on her arms.  She denies any heart palpitaitons, SOB or chest pain.  Denies seeing blood in her stool.  her Metoprolol dose remains the same.  Sugars are stable.    Current Medications (verified): 1)  Aspirin 81 Mg Tbec (Aspirin) .... Take 1 Tablet By Mouth Once A Day 2)  Oystercal 500 + D 500-125 Mg-Unit Tabs (Calcium Carbonate-Vitamin D) .... One By Mouth Twice Daily With Meals 3)  Hyoscyamine Sulfate Cr 0.375 Mg Cp12 (Hyoscyamine Sulfate) .... Take 1 Tablet By Mouth Once A Day 4)  Lantus Solostar 100 Unit/ml Soln (Insulin Glargine) .... 40 Units  Injection Qhs 5)  Metoprolol Tartrate 50 Mg Tabs (Metoprolol Tartrate) .Marland Kitchen.. 1 Tab By Mouth Bid 6)  Novolog Flexpen  Soln (Insulin Aspart Soln) .... 5 Units Subcutaneous With Breakfast, 6 Units Subcutaneous With Lunch, and  8 Units Subcutaneous With Dinner 7)  Lasix 40 Mg Tabs (Furosemide) .... 1/2  Tab By Mouth Daily 8)  Pravastatin  Sodium 40 Mg Tabs (Pravastatin Sodium) .Marland Kitchen.. 1 Tab By Mouth Qhs 9)  Ulticare Pen Needles 29g X 12mm  Misc (Insulin Pen Needle) .... Use As Directed 4 X A Day 10)  Easy Comfort Insulin Syringe 30g X 5/16" 0.5 Ml  Misc (Insulin Syringe-Needle U-100) .... Use As Directed 11)  Restoril 15 Mg Caps (Temazepam) .Marland Kitchen.. 1 Tab By Mouth At Bedtime As Needed Sleep 12)  Azor 10-40 Mg Tabs (Amlodipine-Olmesartan) .Marland Kitchen.. 1 Tab By Mouth Daily 13)  Omeprazole 20 Mg Cpdr (Omeprazole) .Marland Kitchen.. 1 Tab By Mouth Daily 14)  Clonidine Hcl 0.1 Mg Tabs (Clonidine Hcl) .Marland Kitchen.. 1 Tab By Mouth Q 12 Hrs 15)  Gabapentin 600 Mg Tabs (Gabapentin) .Marland Kitchen.. 1 Tab By Mouth Tid 16)  Nitro-Dur 0.4 Mg/hr Pt24 (Nitroglycerin) .Marland Kitchen.. 1 Tab By Mouth Q 5 Min X 3 As Needed Chest Pain 17)  Levothyroxine Sodium 50 Mcg Tabs (Levothyroxine Sodium) .Marland Kitchen.. 1 Tab By Mouth Daily 18)  Acetaminophen-Codeine #3 300-30 Mg Tabs (Acetaminophen-Codeine) .... Take 1 Tab By Mouth Every Three Times A Day As Needed For Pain 19)  Coumadin 5 Mg Tabs (Warfarin Sodium) .... Take  1 Tab By Mouth Once Daily As Directed  Allergies (verified): 1)  ! Ace Inhibitors 2)  ! Monopril 3)  ! Bactrim  Past History:  Past Medical History: hx of kidney stones Current Problems:  COLONIC POLYPS, HYPERPLASTIC, HX OF (ICD-V12.72) A. FIB (Dr Allyson Sabal) 12-2009 GASTRIC ULCER, HX OF (ICD-V12.71) Hx of ESOPHAGEAL STRICTURE (ICD-530.3) HIATAL HERNIA (ICD-553.3) Family Hx of COLON CANCER (ICD-153.9) DIARRHEA (ICD-787.91) MEMORY LOSS (ICD-780.93) OTHER CATARACT (ICD-366.8) CHRONIC DIASTOLIC HEART FAILURE (ICD-428.32) - 2-D echo 1/08 EF 60% MICROALBUMINURIA (ICD-791.0) CAD (ICD-414.00) - normal cath 2/10 by Dr. Allyson Sabal ATHEROSCLEROSIS NOS (ICD-440.9) PERNICIOUS ANEMIA (ICD-281.0) DM W/NEURO MANIFESTATIONS, TYPE II (ICD-250.60) KIDNEY DISEASE, CHRONIC, STAGE III (ICD-585.3) CYSTITIS, CHRONIC (ICD-595.2) OSTEOARTHROSIS, GENERALIZED, MULTIPLE SITES (ICD-715.09) SWELLING, LIMB (ICD-729.81) BACK  PAIN (ICD-724.5) PEPTIC ULCER DIS., UNSPEC. W/O OBSTRUCTION (ICD-533.90) SENILE OSTEOPOROSIS (ICD-733.01) IRRITABLE BOWEL SYNDROME (ICD-564.1) HYPERTENSION, BENIGN SYSTEMIC (ICD-401.1)  Past Surgical History: Reviewed history from 09/17/2009 and no changes required. 2D echo- LVEF 55-60% 1-08  abnml LV relaxation, mild AVR, RV mildly dilated  Appendectomy  carotid dopplers- no significant stenosis - 2007  EGD-- esophageal stricture, gastric ulcer  T&A moderate calcification of the right coronary and left anterior descending arteries.  On CT scan 03/2007  normal heart cath 01/2009 Dr Allyson Sabal. Cataract Extraction- Both eyes  Family History: Reviewed history from 10/29/2008 and no changes required. 9 sibblings died- colon cancer, lung cancer  father died at 35 from AMI  mother died at 2 from DM, AMI Family History of Colon Cancer: Sister Family History of Prostate Cancer: Father Family History of Diabetes: Mother  Social History: Reviewed history from 06/24/2009 and no changes required. Widowed x 18 yrs.  Retired from [employer].  Has 2 daughters- one in [place] and one in [place].  Lives w/ daughter [name] [phone]) in [place] and her son in law who had a stroke.  Nonsmoker.  Denies ETOH. Trying to walk more.  Has 2 living sibblings.  Review of Systems      See HPI  Physical Exam  General:  alert, well-developed, well-nourished, well-hydrated, and overweight-appearing.   Head:  normocephalic and atraumatic.   Eyes:  wears glasses Ears:  no external deformities.   Nose:  no nasal discharge.   Mouth:  pharynx pink and moist and fair dentition.   Neck:  no masses.   Lungs:  Normal respiratory effort, chest expands symmetrically. Lungs are clear to auscultation, no crackles or wheezes. Heart:  RRR with 2/6 SEM Msk:  LLE in cast/ post op shoe, ambulating with quad cane Extremities:  1+ NP bilat LE edema Skin:  bruises over forearms Cervical Nodes:  No lymphadenopathy noted Psych:   good eye contact, not anxious appearing, and not depressed appearing.     Impression & Recommendations:  Problem # 1:  ATRIAL FIBRILLATION (ICD-427.31) Assessment New  I reviewed her hospital discharge summary from [facility] for new onset A. Fib.  She was successfuly cardioverted on 12-29-2009 by [facility] and has f/u with Dr Allyson Sabal on 1-19.  Apparently, she went to the coumadin clinic 1-5 but I have no records.  She took 7.5 mg 2 days/ wk and 5 mg all other days.  She is in NSR today on exam and is asymptomatic.  On Coumadin + continued on a BB for rate control.    Apparently, she is supposed to be on Amiodorone daily per hosp d/c but she does not have the Rx.  I sent this to her pharmacy today.  TEE was done prior to her cardioversion,  negative for clots.  INR (serum) high at 4.6, so I have held today's and tomorrow's coumadin and she has f/u at the coumadin clinic in [place] on Wed. Her updated medication list for this problem includes:    Aspirin 81 Mg Tbec (Aspirin) .Marland Kitchen... Take 1 tablet by mouth once a day    Metoprolol Tartrate 50 Mg Tabs (Metoprolol tartrate) .Marland Kitchen... 1 tab by mouth bid    Azor 10-40 Mg Tabs (Amlodipine-olmesartan) .Marland Kitchen... 1 tab by mouth daily       Amiodarone Hcl 200 Mg Tabs (Amiodarone hcl) .Marland Kitchen... 1 tab by mouth dialy    Warfarin Sodium 5 Mg Tabs (Warfarin sodium) .Marland Kitchen... 1 tab by mouth qpm  Orders: T-Protime, Auto ([id number])  Problem # 2:  HYPERTENSION, BENIGN SYSTEMIC (ICD-401.1) BP a bit high today w/o changes to her med list.  Hx of being very labile.  No changes. Her updated medication list for this problem includes:    Metoprolol Tartrate 50 Mg Tabs (Metoprolol tartrate) .Marland Kitchen... 1 tab by mouth bid    Lasix 40 Mg Tabs (Furosemide) .Marland Kitchen... 1/2  tab by mouth daily    Azor 10-40 Mg Tabs (Amlodipine-olmesartan) .Marland Kitchen... 1 tab by mouth daily    Clonidine Hcl 0.1 Mg Tabs (Clonidine hcl) .Marland Kitchen... 1 tab by mouth q 12 hrs  BP today: 154/66 Prior BP: 101/63 (12/26/2009)  Prior 10 Yr Risk Heart  Disease: N/A (10/23/2008)  Labs Reviewed: K+: 5.4 (02/26/2009) Creat: : 1.38 (08/07/2009)   Chol: 139 (02/13/2008)   HDL: 38 (02/13/2008)   LDL: 71 (02/13/2008)   TG: 149 (02/13/2008)  Problem # 3:  UTI (ICD-599.0) She finished out abx started in the hospital and finished at home.  This may be why she is supratherapeutic. Asymptomatic.  Problem # 4:  DM W/NEURO MANIFESTATIONS, TYPE II (ICD-250.60) Stable while in the hospital.  No changes. Her updated medication list for this problem includes:    Aspirin 81 Mg Tbec (Aspirin) .Marland Kitchen... Take 1 tablet by mouth once a day    Lantus Solostar 100 Unit/ml Soln (Insulin glargine) .Marland KitchenMarland KitchenMarland KitchenMarland Kitchen 40 units [place] injection qhs    Novolog Flexpen Soln (Insulin aspart soln) .Marland KitchenMarland KitchenMarland KitchenMarland Kitchen 5 units subcutaneous with breakfast, 6 units subcutaneous with lunch, and  8 units subcutaneous with dinner    Azor 10-40 Mg Tabs (Amlodipine-olmesartan) .Marland Kitchen... 1 tab by mouth daily  Complete Medication List: 1)  Aspirin 81 Mg Tbec (Aspirin) .... Take 1 tablet by mouth once a day 2)  Oystercal 500 + D 500-125 Mg-unit Tabs (Calcium carbonate-vitamin d) .... One by mouth twice daily with meals 3)  Hyoscyamine Sulfate Cr 0.375 Mg Cp12 (Hyoscyamine sulfate) .... Take 1 tablet by mouth once a day 4)  Lantus Solostar 100 Unit/ml Soln (Insulin glargine) .... 40 units [place] injection qhs 5)  Metoprolol Tartrate 50 Mg Tabs (Metoprolol tartrate) .Marland Kitchen.. 1 tab by mouth bid 6)  Novolog Flexpen Soln (Insulin aspart soln) .... 5 units subcutaneous with breakfast, 6 units subcutaneous with lunch, and  8 units subcutaneous with dinner 7)  Lasix 40 Mg Tabs (Furosemide) .... 1/2  tab by mouth daily 8)  Pravastatin Sodium 40 Mg Tabs (Pravastatin sodium) .Marland Kitchen.. 1 tab by mouth qhs 9)  Ulticare Pen Needles 29g X 12mm Misc (Insulin pen needle) .... Use as directed 4 x a day 10)  Easy Comfort Insulin Syringe 30g X 5/16" 0.5 Ml Misc (Insulin syringe-needle u-100) .... Use as directed 11)  Restoril 15 Mg Caps (Temazepam) .Marland Kitchen..  1 tab by mouth at bedtime as  needed sleep 12)  Azor 10-40 Mg Tabs (Amlodipine-olmesartan) .Marland Kitchen.. 1 tab by mouth daily 13)  Omeprazole 20 Mg Cpdr (Omeprazole) .Marland Kitchen.. 1 tab by mouth daily 14)  Clonidine Hcl 0.1 Mg Tabs (Clonidine hcl) .Marland Kitchen.. 1 tab by mouth q 12 hrs 15)  Gabapentin 600 Mg Tabs (Gabapentin) .Marland Kitchen.. 1 tab by mouth tid 16)  Nitro-dur 0.4 Mg/hr Pt24 (Nitroglycerin) .Marland Kitchen.. 1 tab by mouth q 5 min x 3 as needed chest pain 17)  Levothyroxine Sodium 50 Mcg Tabs (Levothyroxine sodium) .Marland Kitchen.. 1 tab by mouth daily 18)  Acetaminophen-codeine #3 300-30 Mg Tabs (Acetaminophen-codeine) .... Take 1 tab by mouth every three times a day as needed for pain 19)  Coumadin 5 Mg Tabs (Warfarin sodium) .... Take 1 tab by mouth once daily as directed 20)  Amiodarone Hcl 200 Mg Tabs (Amiodarone hcl) .Marland Kitchen.. 1 tab by mouth dialy 21)  Warfarin Sodium 5 Mg Tabs (Warfarin sodium) .Marland Kitchen.. 1 tab by mouth qpm  Patient Instructions: 1)  PT/INR today (2-3 is goal). 2)  F/U with Dr Allyson Sabal Jan 19th. 3)  Add Amiodorone 200 mg once daily (from hospital discharge). 4)  Return for f/u in 2 months. Prescriptions: AMIODARONE HCL 200 MG TABS (AMIODARONE HCL) 1 tab by mouth dialy  #30 x 0   Entered and Authorized by:   Seymour Bars DO   Signed by:   Seymour Bars DO on 01/05/2010   Method used:   Electronically to        [facility] [phone]* (retail)       [address] [place], [place]  [address]       Ph: [phone]       Fax: [phone]   RxID:   [id number]

## 2011-01-28 NOTE — Assessment & Plan Note (Signed)
Summary: f/u DM   Vital Signs:  Patient profile:   77 year old female Height:      68 inches Weight:      196 pounds BMI:     29.91 O2 Sat:      95 % on Room air Pulse rate:   88 / minute BP sitting:   123 / 75  (left arm) Cuff size:   regular  Vitals Entered By: Payton Spark CMA (October 07, 2010 1:45 PM)  O2 Flow:  Room air CC: F/U DM.   Primary Care Provider:  Seymour Bars DO  CC:  F/U DM.Marland Kitchen  History of Present Illness: 77 yo WF presents for f/u DM.  She has complication of diabetic neuropathy and nephropathy.  She is seeing Dr Fernanda Drum for her foot pain and has increased her gabapentin to 3 x a day.    She has had more frequent LBP from her arthritis and the Tylenol #3 is not helping.   Has pain from her neuropathy.. Her daughter reports that about 2 wks ago, she woke up with back pain folllowed by diarrhea(which she gets frequently from a hx of collagenous colitis).  Later that day, she had a syncopal event that was witnessed.  She refused to go to the [facility]stal.  She has a pacer that was recently interrogated.  She admitted that she had not drank much that day when she had diarrhea and had taken her BP meds.  She has had fair control of ther blood sugars..    Current Medications (verified): 1)  Aspirin 81 Mg Tbec (Aspirin) .... Take 1 Tablet By Mouth Once A Day 2)  Oystercal 500 + D 500-125 Mg-Unit Tabs (Calcium Carbonate-Vitamin D) .... One By Mouth Twice Daily With Meals 3)  Hyoscyamine Sulfate Cr 0.375 Mg Cp12 (Hyoscyamine Sulfate) .... Take 1 Tablet By Mouth Once A Day 4)  Lantus Solostar 100 Unit/ml Soln (Insulin Glargine) .... 47  Units [place] Injection Qhs 5)  Metoprolol Succinate 25 Mg Xr24h-Tab (Metoprolol Succinate) .Marland Kitchen.. 1 Tab By Mouth Daily 6)  Novolog Flexpen  Soln (Insulin Aspart Soln) .... 5 Units Subcutaneous With Breakfast, 6 Units Subcutaneous With Lunch, and  8 Units Subcutaneous With Dinner 7)  Lasix 40 Mg Tabs (Furosemide) .... 1/2  Tab By Mouth Daily 8)   Pravastatin Sodium 40 Mg Tabs (Pravastatin Sodium) .Marland Kitchen.. 1 Tab By Mouth Qhs 9)  Ulticare Pen Needles 29g X 12mm  Misc (Insulin Pen Needle) .... Use As Directed 4 X A Day 10)  Easy Comfort Insulin Syringe 30g X 5/16" 0.5 Ml  Misc (Insulin Syringe-Needle U-100) .... Use As Directed 11)  Restoril 15 Mg Caps (Temazepam) .Marland Kitchen.. 1 Tab By Mouth At Bedtime As Needed Sleep 12)  Azor 10-40 Mg Tabs (Amlodipine-Olmesartan) .Marland Kitchen.. 1 Tab By Mouth Daily 13)  Pantoprazole Sodium 40 Mg Tbec (Pantoprazole Sodium) .... Take 1 Tab By Mouth Once Daily 14)  Clonidine Hcl 0.1 Mg Tabs (Clonidine Hcl) .Marland Kitchen.. 1 Tab By Mouth Q 12 Hrs 15)  Gabapentin 600 Mg Tabs (Gabapentin) .Marland Kitchen.. 1 Tab By Mouth Tid 16)  Nitro-Dur 0.4 Mg/hr Pt24 (Nitroglycerin) .Marland Kitchen.. 1 Tab By Mouth Q 5 Min X 3 As Needed Chest Pain 17)  Levothroid 75 Mcg Tabs (Levothyroxine Sodium) .Marland Kitchen.. 1 Tab By Mouth Daily 18)  Coumadin 5 Mg Tabs (Warfarin Sodium) .... Take 1 Tab By Mouth Once Daily As Directed 19)  Hydrocodone-Acetaminophen 5-325 Mg Tabs (Hydrocodone-Acetaminophen) .... Take As Directed As Needed  Allergies (verified): 1)  ! Ace  Inhibitors 2)  ! Monopril 3)  ! Bactrim  Past History:  Past Medical History: Reviewed history from 01/23/2010 and no changes required. hx of kidney stones Current Problems:  COLONIC POLYPS, HYPERPLASTIC, HX OF (ICD-V12.72) A. FIB (Dr Allyson Sabal) 12-2009 GASTRIC ULCER, HX OF (ICD-V12.71) Hx of ESOPHAGEAL STRICTURE (ICD-530.3) HIATAL HERNIA (ICD-553.3) Family Hx of COLON CANCER (ICD-153.9) DIARRHEA (ICD-787.91) MEMORY LOSS (ICD-780.93) OTHER CATARACT (ICD-366.8) CHRONIC DIASTOLIC HEART FAILURE (ICD-428.32) - 2-D echo 1/08 EF 60% MICROALBUMINURIA (ICD-791.0) CAD (ICD-414.00) - normal cath 2/10 by Dr. Allyson Sabal ATHEROSCLEROSIS NOS (ICD-440.9) PERNICIOUS ANEMIA (ICD-281.0) DM W/NEURO MANIFESTATIONS, TYPE II (ICD-250.60) KIDNEY DISEASE, CHRONIC, STAGE III (ICD-585.3) CYSTITIS, CHRONIC (ICD-595.2) OSTEOARTHROSIS, GENERALIZED, MULTIPLE  SITES (ICD-715.09) SWELLING, LIMB (ICD-729.81) BACK PAIN (ICD-724.5) PEPTIC ULCER DIS., UNSPEC. W/O OBSTRUCTION (ICD-533.90) SENILE OSTEOPOROSIS (ICD-733.01) IRRITABLE BOWEL SYNDROME (ICD-564.1) HYPERTENSION, BENIGN SYSTEMIC (ICD-401.1)  L foot fx (Dr Margaretha Sheffield)  Past Surgical History: Reviewed history from 03/04/2010 and no changes required. 2D echo- LVEF 55-60% 1-08  abnml LV relaxation, mild AVR, RV mildly dilated  Appendectomy  carotid dopplers- no significant stenosis - 2007  EGD-- esophageal stricture, gastric ulcer  T&A moderate calcification of the right coronary and left anterior descending arteries.  On CT scan 03/2007  normal heart cath 01/2009 Dr Allyson Sabal. Cataract Extraction- Both eyes pacemaker placement Feb 2011  Social History: Reviewed history from 03/04/2010 and no changes required. Widowed x 21 yrs.  Retired from [employer].  Has 2 daughters- one in [place] and one in [place].  Lives w/ daughter [name] [phone]) in [place] and her son in law who had a stroke.  Nonsmoker.  Denies ETOH. Trying to walk more.  Has 2 living sibblings.  Review of Systems      See HPI  Physical Exam  General:  alert, well-developed, well-nourished, and well-hydrated.  here with Sherri Head:  normocephalic and atraumatic.   Eyes:  PERRLA, wears glasses Mouth:  pharynx pink and moist and fair dentition.   Neck:  no masses.  Lungs:  Normal respiratory effort, chest expands symmetrically. Lungs are clear to auscultation, no crackles or wheezes. Heart:  RRR with 2/6 SEM Msk:  tender R lumbar parastpinal muscles with normal gait Extremities:  1+ pitting bilat LE edema Skin:  color normal.   Cervical Nodes:  No lymphadenopathy noted Psych:  good eye contact, not anxious appearing, and not depressed appearing.     Impression & Recommendations:  Problem # 1:  SYNCOPE (ICD-780.2) Reviewed the hx of most recent syncopal event and it appears to have been some orthostatic hypotension from a day  of diarrhea with poor fluid intake compounded by taking BP meds.  She quiickly improved after drinking that day.  No reason this should be cardiac related since she's had a full cardiac w/u with a pacer that was just recently interrograted and PT/INR testing UTD with [facility].  Problem # 2:  UNSPECIFIED HYPOTHYROIDISM (ICD-244.9) Due to recheck today. Her updated medication list for this problem includes:    Levothroid 75 Mcg Tabs (Levothyroxine sodium) .Marland Kitchen... 1 tab by mouth daily  Orders: T-TSH [phone])  Labs Reviewed: TSH: 4.683 (06/16/2010)    HgBA1c: 7.3 (10/07/2010) Chol: 139 (02/13/2008)   HDL: 38 (02/13/2008)   LDL: 71 (02/13/2008)   TG: 149 (02/13/2008)  Problem # 3:  ATRIAL FIBRILLATION (ICD-427.31) Reviewed notes from Dr Allyson Sabal.  She continues to c/o fatigue.  Some of which bay be med related but reminded her that she needed to stay of all of her meds.   The following medications were  removed from the medication list:    Amiodarone Hcl 200 Mg Tabs (Amiodarone hcl) .Marland Kitchen... 1 tab by mouth dialy Her updated medication list for this problem includes:    Aspirin 81 Mg Tbec (Aspirin) .Marland Kitchen... Take 1 tablet by mouth once a day    Metoprolol Succinate 25 Mg Xr24h-tab (Metoprolol succinate) .Marland Kitchen... 1 tab by mouth daily    Azor 10-40 Mg Tabs (Amlodipine-olmesartan) .Marland Kitchen... 1 tab by mouth daily    Coumadin 5 Mg Tabs (Warfarin sodium) .Marland Kitchen... Take 1 tab by mouth once daily as directed  Problem # 4:  CAD (ICD-414.00) Reviewed notes from Dr Allyson Sabal. cath done 01-2009.  Pacer done this year.   The following medications were removed from the medication list:    Amiodarone Hcl 200 Mg Tabs (Amiodarone hcl) .Marland Kitchen... 1 tab by mouth dialy Her updated medication list for this problem includes:    Aspirin 81 Mg Tbec (Aspirin) .Marland Kitchen... Take 1 tablet by mouth once a day    Metoprolol Succinate 25 Mg Xr24h-tab (Metoprolol succinate) .Marland Kitchen... 1 tab by mouth daily    Lasix 40 Mg Tabs (Furosemide) .Marland Kitchen... 1/2  tab by mouth  daily    Azor 10-40 Mg Tabs (Amlodipine-olmesartan) .Marland Kitchen... 1 tab by mouth daily    Clonidine Hcl 0.1 Mg Tabs (Clonidine hcl) .Marland Kitchen... 1 tab by mouth q 12 hrs    Nitro-dur 0.4 Mg/hr Pt24 (Nitroglycerin) .Marland Kitchen... 1 tab by mouth q 5 min x 3 as needed chest pain  Problem # 5:  DM W/NEURO MANIFESTATIONS, TYPE II (ICD-250.60) A1c improved to 7.3.  She had cut her own dose of Lantus due to lows.  I will keep her on 30 units once a day with Novolog at meals.  Flu shot done today.   Her updated medication list for this problem includes:    Aspirin 81 Mg Tbec (Aspirin) .Marland Kitchen... Take 1 tablet by mouth once a day    Lantus Solostar 100 Unit/ml Soln (Insulin glargine) .Marland KitchenMarland KitchenMarland KitchenMarland Kitchen 30  units [place] injection qhs    Novolog Flexpen Soln (Insulin aspart soln) .Marland KitchenMarland KitchenMarland KitchenMarland Kitchen 5 units subcutaneous with breakfast, 6 units subcutaneous with lunch, and  8 units subcutaneous with dinner    Azor 10-40 Mg Tabs (Amlodipine-olmesartan) .Marland Kitchen... 1 tab by mouth daily  Orders: Fingerstick (36416) Hgb A1C (04540JW) Urine Microalbumin (11914)  Labs Reviewed: Creat: 1.38 (06/16/2010)   Microalbumin: 30 (10/07/2010)  Last Eye Exam: normal (09/11/2008) Reviewed HgBA1c results: 7.3 (10/07/2010)  8.2 (06/15/2010)  Complete Medication List: 1)  Aspirin 81 Mg Tbec (Aspirin) .... Take 1 tablet by mouth once a day 2)  Oystercal 500 + D 500-125 Mg-unit Tabs (Calcium carbonate-vitamin d) .... One by mouth twice daily with meals 3)  Hyoscyamine Sulfate Cr 0.375 Mg Cp12 (Hyoscyamine sulfate) .... Take 1 tablet by mouth once a day 4)  Lantus Solostar 100 Unit/ml Soln (Insulin glargine) .... 30  units [place] injection qhs 5)  Metoprolol Succinate 25 Mg Xr24h-tab (Metoprolol succinate) .Marland Kitchen.. 1 tab by mouth daily 6)  Novolog Flexpen Soln (Insulin aspart soln) .... 5 units subcutaneous with breakfast, 6 units subcutaneous with lunch, and  8 units subcutaneous with dinner 7)  Lasix 40 Mg Tabs (Furosemide) .... 1/2  tab by mouth daily 8)  Pravastatin Sodium 40 Mg Tabs  (Pravastatin sodium) .Marland Kitchen.. 1 tab by mouth qhs 9)  Ulticare Pen Needles 29g X 12mm Misc (Insulin pen needle) .... Use as directed 4 x a day 10)  Easy Comfort Insulin Syringe 30g X 5/16" 0.5 Ml Misc (Insulin syringe-needle  u-100) .... Use as directed 11)  Restoril 15 Mg Caps (Temazepam) .Marland Kitchen.. 1 tab by mouth at bedtime as needed sleep 12)  Azor 10-40 Mg Tabs (Amlodipine-olmesartan) .Marland Kitchen.. 1 tab by mouth daily 13)  Pantoprazole Sodium 40 Mg Tbec (Pantoprazole sodium) .... Take 1 tab by mouth once daily 14)  Clonidine Hcl 0.1 Mg Tabs (Clonidine hcl) .Marland Kitchen.. 1 tab by mouth q 12 hrs 15)  Gabapentin 600 Mg Tabs (Gabapentin) .Marland Kitchen.. 1 tab by mouth tid 16)  Nitro-dur 0.4 Mg/hr Pt24 (Nitroglycerin) .Marland Kitchen.. 1 tab by mouth q 5 min x 3 as needed chest pain 17)  Levothroid 75 Mcg Tabs (Levothyroxine sodium) .Marland Kitchen.. 1 tab by mouth daily 18)  Coumadin 5 Mg Tabs (Warfarin sodium) .... Take 1 tab by mouth once daily as directed 19)  Hydrocodone-acetaminophen 5-325 Mg Tabs (Hydrocodone-acetaminophen) .Marland Kitchen.. 1 tab by mouth q 6 hrs as needed back pain; take with food  Other Orders: Flu Vaccine 11yrs + MEDICARE PATIENTS (N8295) Administration Flu vaccine - MCR (A2130) Flu Vaccine Consent Questions     Do you have a history of severe allergic reactions to this vaccine? no    Any prior history of allergic reactions to egg and/or gelatin? no    Do you have a sensitivity to the preservative Thimersol? no    Do you have a past history of Guillan-Barre Syndrome? no    Do you currently have an acute febrile illness? no    Have you ever had a severe reaction to latex? no    Vaccine information given and explained to patient? yes    Are you currently pregnant? no    Lot Number:[id number]   Exp Date:06/26/2011   Site Given  Left Deltoid IMers: Flu Vaccine 77yrs + MEDICARE PATIENTS (Q6578) Administration Flu vaccine - MCR (I6962)  Patient Instructions: 1)  Change Lantus to 30 units in the morning. 2)  AM fasting goal is 80-120. 3)  2 hr  after dinner at is <150. 4)  Update TSH today. 5)  Will call you w/ results tomorrow. 6)  Use Hydrocodone as needed for back pain. 7)  REturn for follow up in 4 months. Prescriptions: HYDROCODONE-ACETAMINOPHEN 5-325 MG TABS (HYDROCODONE-ACETAMINOPHEN) 1 tab by mouth q 6 hrs as needed back pain; take with food  #60 x 0   Entered and Authorized by:   Seymour Bars DO   Signed by:   Seymour Bars DO on 10/07/2010   Method used:   Printed then faxed to ...       [address] [phone]* (retail)       [address] [place], [place]  [address]       Ph: [phone]       Fax: [phone]   RxID:   [id number] RESTORIL 15 MG CAPS (TEMAZEPAM) 1 tab by mouth at bedtime as needed sleep  #30 x 5   Entered and Authorized by:   Seymour Bars DO   Signed by:   Seymour Bars DO on 10/07/2010   Method used:   Printed then faxed to ...       [address] [phone]* (retail)       [address] [place], [place]  [address]       Ph: [phone]       Fax: [phone]   RxID:   [id number]    .lbmedflu  Laboratory Results   Urine Tests    Microalbumin (  urine): 30 mg/L Creatinine: 200mg /dL  A:C Ratio <78  Blood Tests     HGBA1C: 7.3%   (Normal Range: Non-Diabetic - 3-6%   Control Diabetic - 6-8%)

## 2011-01-28 NOTE — Assessment & Plan Note (Signed)
Summary: DM f/u   Vital Signs:  Patient profile:   77 year old female Height:      68 inches Weight:      195 pounds BMI:     29.76 O2 Sat:      96 % on Room air Pulse rate:   69 / minute BP sitting:   125 / 67  (left arm) Cuff size:   regular  Vitals Entered By: Payton Spark CMA (March 04, 2010 11:15 AM)  O2 Flow:  Room air CC: F/U DM   Primary Care Provider:  Seymour Bars DO  CC:  F/U DM.  History of Present Illness: 77 yo WF presents for f/u T2DM.    Since her last visit with me, she was admitted to [facility] for bradycardia into the 30s and had a pacemaker placed.  Her energy level has improved.  She did not bring in her meds today to see if any changes have been made.    She has f/u'd with ortho for her L 5th metatarsal Jones Fx wihich is not healing but she is not having pain from it and it was decided that surgery would be too dangerous.  Her sugars have been improving but she is still not using her mealtime Novolog.  Her A1C is due today.  Denies blurry vision and has neuropathy which is unchanged.    Denies CP, SOB or heart palpitations.  Having R flank pain but no new urinary symptoms.  Was treated for UTI in the hospital a month ago.  Daughter reports that she is having a lot of OA pain but Tyelnol #3 is not helping.    Current Medications (verified): 1)  Aspirin 81 Mg Tbec (Aspirin) .... Take 1 Tablet By Mouth Once A Day 2)  Oystercal 500 + D 500-125 Mg-Unit Tabs (Calcium Carbonate-Vitamin D) .... One By Mouth Twice Daily With Meals 3)  Hyoscyamine Sulfate Cr 0.375 Mg Cp12 (Hyoscyamine Sulfate) .... Take 1 Tablet By Mouth Once A Day 4)  Lantus Solostar 100 Unit/ml Soln (Insulin Glargine) .... 40 Units [place] Injection Qhs 5)  Metoprolol Tartrate 50 Mg Tabs (Metoprolol Tartrate) .Marland Kitchen.. 1 Tab By Mouth Daily 6)  Novolog Flexpen  Soln (Insulin Aspart Soln) .... 5 Units Subcutaneous With Breakfast, 6 Units Subcutaneous With Lunch, and  8 Units Subcutaneous With Dinner 7)   Lasix 40 Mg Tabs (Furosemide) .... 1/2  Tab By Mouth Daily 8)  Pravastatin Sodium 40 Mg Tabs (Pravastatin Sodium) .Marland Kitchen.. 1 Tab By Mouth Qhs 9)  Ulticare Pen Needles 29g X 12mm  Misc (Insulin Pen Needle) .... Use As Directed 4 X A Day 10)  Easy Comfort Insulin Syringe 30g X 5/16" 0.5 Ml  Misc (Insulin Syringe-Needle U-100) .... Use As Directed 11)  Restoril 15 Mg Caps (Temazepam) .Marland Kitchen.. 1 Tab By Mouth At Bedtime As Needed Sleep 12)  Azor 10-40 Mg Tabs (Amlodipine-Olmesartan) .Marland Kitchen.. 1 Tab By Mouth Daily 13)  Omeprazole 20 Mg Cpdr (Omeprazole) .Marland Kitchen.. 1 Tab By Mouth Daily 14)  Clonidine Hcl 0.1 Mg Tabs (Clonidine Hcl) .Marland Kitchen.. 1 Tab By Mouth Q 12 Hrs 15)  Gabapentin 600 Mg Tabs (Gabapentin) .Marland Kitchen.. 1 Tab By Mouth Tid 16)  Nitro-Dur 0.4 Mg/hr Pt24 (Nitroglycerin) .Marland Kitchen.. 1 Tab By Mouth Q 5 Min X 3 As Needed Chest Pain 17)  Levothyroxine Sodium 50 Mcg Tabs (Levothyroxine Sodium) .Marland Kitchen.. 1 Tab By Mouth Daily 18)  Coumadin 5 Mg Tabs (Warfarin Sodium) .... Take 1 Tab By Mouth Once Daily As Directed 19)  Amiodarone Hcl 200 Mg Tabs (Amiodarone Hcl) .Marland Kitchen.. 1 Tab By Mouth Dialy  Allergies (verified): 1)  ! Ace Inhibitors 2)  ! Monopril 3)  ! Bactrim  Past History:  Past Medical History: Reviewed history from 01/23/2010 and no changes required. hx of kidney stones Current Problems:  COLONIC POLYPS, HYPERPLASTIC, HX OF (ICD-V12.72) A. FIB (Dr Allyson Sabal) 12-2009 GASTRIC ULCER, HX OF (ICD-V12.71) Hx of ESOPHAGEAL STRICTURE (ICD-530.3) HIATAL HERNIA (ICD-553.3) Family Hx of COLON CANCER (ICD-153.9) DIARRHEA (ICD-787.91) MEMORY LOSS (ICD-780.93) OTHER CATARACT (ICD-366.8) CHRONIC DIASTOLIC HEART FAILURE (ICD-428.32) - 2-D echo 1/08 EF 60% MICROALBUMINURIA (ICD-791.0) CAD (ICD-414.00) - normal cath 2/10 by Dr. Allyson Sabal ATHEROSCLEROSIS NOS (ICD-440.9) PERNICIOUS ANEMIA (ICD-281.0) DM W/NEURO MANIFESTATIONS, TYPE II (ICD-250.60) KIDNEY DISEASE, CHRONIC, STAGE III (ICD-585.3) CYSTITIS, CHRONIC (ICD-595.2) OSTEOARTHROSIS,  GENERALIZED, MULTIPLE SITES (ICD-715.09) SWELLING, LIMB (ICD-729.81) BACK PAIN (ICD-724.5) PEPTIC ULCER DIS., UNSPEC. W/O OBSTRUCTION (ICD-533.90) SENILE OSTEOPOROSIS (ICD-733.01) IRRITABLE BOWEL SYNDROME (ICD-564.1) HYPERTENSION, BENIGN SYSTEMIC (ICD-401.1)  L foot fx (Dr Margaretha Sheffield)  Past Surgical History: 2D echo- LVEF 55-60% 1-08  abnml LV relaxation, mild AVR, RV mildly dilated  Appendectomy  carotid dopplers- no significant stenosis - 2007  EGD-- esophageal stricture, gastric ulcer  T&A moderate calcification of the right coronary and left anterior descending arteries.  On CT scan 03/2007  normal heart cath 01/2009 Dr Allyson Sabal. Cataract Extraction- Both eyes pacemaker placement Feb 2011  Social History: Widowed x 21 yrs.  Retired from [employer].  Has 2 daughters- one in [place] and one in [place].  Lives w/ daughter [name] [phone]) in [place] and her son in law who had a stroke.  Nonsmoker.  Denies ETOH. Trying to walk more.  Has 2 living sibblings.  Review of Systems      See HPI  Physical Exam  General:  alert, well-developed, well-nourished, and well-hydrated.  here with [facility]:  normocephalic and atraumatic.   Eyes:  PERRLA, wears glasses Ears:  no external deformities.   Mouth:  pharynx pink and moist.   Neck:  no masses.   Chest Wall:  L chest wall pacer scar healing well Lungs:  Normal respiratory effort, chest expands symmetrically. Lungs are clear to auscultation, no crackles or wheezes. Heart:  irreg irreg with 2/6 SEM Abdomen:  R CVAT Pulses:  2+ radial pulses Extremities:  1+pitting LLE edema Skin:  color normal.   Psych:  good eye contact, not anxious appearing, and not depressed appearing.     Impression & Recommendations:  Problem # 1:  DM W/NEURO MANIFESTATIONS, TYPE II (ICD-250.60) A1C improved to 7.7 today.  Encouraged her to use her mealtime Novolog which she is not routinely using.  Diabetic eye exam is due.  Urine micro is UTD.  F/U in 3  mos. Her updated medication list for this problem includes:    Aspirin 81 Mg Tbec (Aspirin) .Marland Kitchen... Take 1 tablet by mouth once a day    Lantus Solostar 100 Unit/ml Soln (Insulin glargine) .Marland KitchenMarland KitchenMarland KitchenMarland Kitchen 40 units [place] injection qhs    Novolog Flexpen Soln (Insulin aspart soln) .Marland KitchenMarland KitchenMarland KitchenMarland Kitchen 5 units subcutaneous with breakfast, 6 units subcutaneous with lunch, and  8 units subcutaneous with dinner    Azor 10-40 Mg Tabs (Amlodipine-olmesartan) .Marland Kitchen... 1 tab by mouth daily  Orders: Fingerstick (36416) Hemoglobin A1C (83036) UA Dipstick w/o Micro (automated)  (81003)  Labs Reviewed: Creat: 1.38 (08/07/2009)   Microalbumin: 30 (09/05/2009)  Last Eye Exam: normal (09/11/2008) Reviewed HgBA1c results: 7.7 (03/04/2010)  8.1 (09/05/2009)  Problem # 2:  HYPERTENSION, BENIGN SYSTEMIC (ICD-401.1) BP looks great  today.  Continue current meds. Her updated medication list for this problem includes:    Metoprolol Succinate 25 Mg Xr24h-tab (Metoprolol succinate) .Marland Kitchen... 1 tab by mouth daily    Lasix 40 Mg Tabs (Furosemide) .Marland Kitchen... 1/2  tab by mouth daily    Azor 10-40 Mg Tabs (Amlodipine-olmesartan) .Marland Kitchen... 1 tab by mouth daily    Clonidine Hcl 0.1 Mg Tabs (Clonidine hcl) .Marland Kitchen... 1 tab by mouth q 12 hrs  BP today: 125/67 Prior BP: 125/43 (01/23/2010)  Prior 10 Yr Risk Heart Disease: N/A (10/23/2008)  Labs Reviewed: K+: 5.4 (02/26/2009) Creat: : 1.38 (08/07/2009)   Chol: 139 (02/13/2008)   HDL: 38 (02/13/2008)   LDL: 71 (02/13/2008)   TG: 149 (02/13/2008)  Problem # 3:  ATRIAL FIBRILLATION (ICD-427.31) Following with the coumadin clinic.  On Amiodorone and rate controlled with Toprol now that she has a pacer to prevent bradycardia. The following medications were removed from the medication list:    Warfarin Sodium 5 Mg Tabs (Warfarin sodium) .Marland Kitchen... 1 tab by mouth qpm Her updated medication list for this problem includes:    Aspirin 81 Mg Tbec (Aspirin) .Marland Kitchen... Take 1 tablet by mouth once a day    Metoprolol Succinate 25 Mg  Xr24h-tab (Metoprolol succinate) .Marland Kitchen... 1 tab by mouth daily    Azor 10-40 Mg Tabs (Amlodipine-olmesartan) .Marland Kitchen... 1 tab by mouth daily    Coumadin 5 Mg Tabs (Warfarin sodium) .Marland Kitchen... Take 1 tab by mouth once daily as directed    Amiodarone Hcl 200 Mg Tabs (Amiodarone hcl) .Marland Kitchen... 1 tab by mouth dialy  Problem # 4:  UNSPECIFIED HYPOTHYROIDISM (ICD-244.9)  Her updated medication list for this problem includes:    Levothyroxine Sodium 50 Mcg Tabs (Levothyroxine sodium) .Marland Kitchen... 1 tab by mouth daily  Labs Reviewed: TSH: 4.273 (09/24/2009)    HgBA1c: 7.7 (03/04/2010) Chol: 139 (02/13/2008)   HDL: 38 (02/13/2008)   LDL: 71 (02/13/2008)   TG: 149 (02/13/2008)  Problem # 5:  BACK PAIN (ICD-724.5) UA is normal indicating the CVAT is not from a UTI.  This is likely to be from arthrits in her back.  Will replace her Tyelnol #3 with Vicodin with caution.   The following medications were removed from the medication list:    Acetaminophen-codeine #3 300-30 Mg Tabs (Acetaminophen-codeine) .Marland Kitchen... Take 1 tab by mouth every three times a day as needed for pain Her updated medication list for this problem includes:    Aspirin 81 Mg Tbec (Aspirin) .Marland Kitchen... Take 1 tablet by mouth once a day    Vicodin 5-500 Mg Tabs (Hydrocodone-acetaminophen) .Marland Kitchen... 1 tab by mouth three times a day as needed severe back pain, take with food  Complete Medication List: 1)  Aspirin 81 Mg Tbec (Aspirin) .... Take 1 tablet by mouth once a day 2)  Oystercal 500 + D 500-125 Mg-unit Tabs (Calcium carbonate-vitamin d) .... One by mouth twice daily with meals 3)  Hyoscyamine Sulfate Cr 0.375 Mg Cp12 (Hyoscyamine sulfate) .... Take 1 tablet by mouth once a day 4)  Lantus Solostar 100 Unit/ml Soln (Insulin glargine) .... 40 units [place] injection qhs 5)  Metoprolol Succinate 25 Mg Xr24h-tab (Metoprolol succinate) .Marland Kitchen.. 1 tab by mouth daily 6)  Novolog Flexpen Soln (Insulin aspart soln) .... 5 units subcutaneous with breakfast, 6 units subcutaneous with  lunch, and  8 units subcutaneous with dinner 7)  Lasix 40 Mg Tabs (Furosemide) .... 1/2  tab by mouth daily 8)  Pravastatin Sodium 40 Mg Tabs (Pravastatin sodium) .Marland Kitchen.. 1 tab by  mouth qhs 9)  Ulticare Pen Needles 29g X 12mm Misc (Insulin pen needle) .... Use as directed 4 x a day 10)  Easy Comfort Insulin Syringe 30g X 5/16" 0.5 Ml Misc (Insulin syringe-needle u-100) .... Use as directed 11)  Restoril 15 Mg Caps (Temazepam) .Marland Kitchen.. 1 tab by mouth at bedtime as needed sleep 12)  Azor 10-40 Mg Tabs (Amlodipine-olmesartan) .Marland Kitchen.. 1 tab by mouth daily 13)  Omeprazole 20 Mg Cpdr (Omeprazole) .Marland Kitchen.. 1 tab by mouth daily 14)  Clonidine Hcl 0.1 Mg Tabs (Clonidine hcl) .Marland Kitchen.. 1 tab by mouth q 12 hrs 15)  Gabapentin 600 Mg Tabs (Gabapentin) .Marland Kitchen.. 1 tab by mouth tid 16)  Nitro-dur 0.4 Mg/hr Pt24 (Nitroglycerin) .Marland Kitchen.. 1 tab by mouth q 5 min x 3 as needed chest pain 17)  Levothyroxine Sodium 50 Mcg Tabs (Levothyroxine sodium) .Marland Kitchen.. 1 tab by mouth daily 18)  Coumadin 5 Mg Tabs (Warfarin sodium) .... Take 1 tab by mouth once daily as directed 19)  Amiodarone Hcl 200 Mg Tabs (Amiodarone hcl) .Marland Kitchen.. 1 tab by mouth dialy 20)  Vicodin 5-500 Mg Tabs (Hydrocodone-acetaminophen) .Marland Kitchen.. 1 tab by mouth three times a day as needed severe back pain, take with food  Patient Instructions: 1)  Use Vicodin as needed for severe back pain. 2)  Take with food and use MIRALAX every other day to prevent constipation as needed. 3)  Stay on current meds. 4)  A1C looks better at 7.7. 5)  UA today. Prescriptions: VICODIN 5-500 MG TABS (HYDROCODONE-ACETAMINOPHEN) 1 tab by mouth three times a day as needed severe back pain, take with food  #40 x 0   Entered and Authorized by:   Seymour Bars DO   Signed by:   Seymour Bars DO on 03/04/2010   Method used:   Printed then faxed to ...       [address] [phone]* (retail)       [address] [place], [place]  [address]       Ph: [phone]       Fax: [phone]   RxID:    [id number] RESTORIL 15 MG CAPS (TEMAZEPAM) 1 tab by mouth at bedtime as needed sleep  #30 x 3   Entered and Authorized by:   Seymour Bars DO   Signed by:   Seymour Bars DO on 03/04/2010   Method used:   Printed then faxed to ...       [address] [phone]* (retail)       [address] [place], [place]  [address]       Ph: [phone]       Fax: [phone]   RxID:   [id number]   Laboratory Results   Urine Tests    Routine Urinalysis   Color: yellow Appearance: Clear Glucose: negative   (Normal Range: Negative) Bilirubin: small   (Normal Range: Negative) Ketone: trace (5)   (Normal Range: Negative) Spec. Gravity: 1.015   (Normal Range: 1.003-1.035) Blood: negative   (Normal Range: Negative) pH: 5.5   (Normal Range: 5.0-8.0) Protein: negative   (Normal Range: Negative) Urobilinogen: 0.2   (Normal Range: 0-1) Nitrite: negative   (Normal Range: Negative) Leukocyte Esterace: negative   (Normal Range: Negative)     Blood Tests     HGBA1C: 7.7%   (Normal Range: Non-Diabetic - 3-6%   Control Diabetic - 6-8%)

## 2011-01-28 NOTE — Letter (Signed)
Summary: [facility]  [facility]   Imported By: Lanelle Bal 02/06/2010 13:13:06  _____________________________________________________________________  External Attachment:    Type:   Image     Comment:   External Document

## 2011-01-28 NOTE — Letter (Signed)
Summary: [facility]  [facility]   Imported By: Lanelle Bal 02/02/2010 10:49:29  _____________________________________________________________________  External Attachment:    Type:   Image     Comment:   External Document

## 2011-01-28 NOTE — Assessment & Plan Note (Signed)
Summary: recheck foot ulcer/ ?UTI   Vital Signs:  Patient profile:   77 year old female Height:      68 inches Weight:      199 pounds BMI:     30.37 O2 Sat:      95 % on Room air Temp:     98.3 degrees F oral Pulse rate:   99 / minute BP sitting:   113 / 69  (left arm) Cuff size:   regular  Vitals Entered By: Payton Spark CMA (November 11, 2010 1:38 PM)  O2 Flow:  Room air CC: F/U foot ulcer   Primary Care Provider:  Seymour Bars DO  CC:  F/U foot ulcer.  History of Present Illness: 77 yo WF presents for me to recheck an ulcer over the dorsum of her L foot.  We treated her with Lasix (for swelling) every other day, Soap and water soaks and topical bactroban ointment with gauze.  She has also been elevating her feet.  Denies any pain or redness.  Denies fevers or chills.  She c/o high sugars x 10 days -- 400s but she is 'barely' eating and says that she is very tired and SOB.  She has hx of CHF and was recently put on Amiododorone with Dr Allyson Sabal.  Denies orthropnea or PND.  Denies abd pain but has hx fo recurrent UTI.    Current Medications (verified): 1)  Aspirin 81 Mg Tbec (Aspirin) .... Take 1 Tablet By Mouth Once A Day 2)  Oystercal 500 + D 500-125 Mg-Unit Tabs (Calcium Carbonate-Vitamin D) .... One By Mouth Twice Daily With Meals 3)  Hyoscyamine Sulfate Cr 0.375 Mg Cp12 (Hyoscyamine Sulfate) .... Take 1 Tablet By Mouth Once A Day 4)  Lantus Solostar 100 Unit/ml Soln (Insulin Glargine) .... 30  Units  Injection Qhs 5)  Metoprolol Succinate 25 Mg Xr24h-Tab (Metoprolol Succinate) .Marland Kitchen.. 1 Tab By Mouth Daily 6)  Novolog Flexpen  Soln (Insulin Aspart Soln) .... 5 Units Subcutaneous With Breakfast, 6 Units Subcutaneous With Lunch, and  8 Units Subcutaneous With Dinner 7)  Lasix 40 Mg Tabs (Furosemide) .... 1/2  Tab By Mouth Daily 8)  Pravastatin Sodium 40 Mg Tabs (Pravastatin Sodium) .Marland Kitchen.. 1 Tab By Mouth Qhs 9)  Ulticare Pen Needles 29g X 12mm  Misc (Insulin Pen Needle) .... Use  As Directed 4 X A Day 10)  Easy Comfort Insulin Syringe 30g X 5/16" 0.5 Ml  Misc (Insulin Syringe-Needle U-100) .... Use As Directed 11)  Restoril 15 Mg Caps (Temazepam) .Marland Kitchen.. 1 Tab By Mouth At Bedtime As Needed Sleep 12)  Azor 10-40 Mg Tabs (Amlodipine-Olmesartan) .Marland Kitchen.. 1 Tab By Mouth Daily 13)  Pantoprazole Sodium 40 Mg Tbec (Pantoprazole Sodium) .... Take 1 Tab By Mouth Once Daily 14)  Clonidine Hcl 0.1 Mg Tabs (Clonidine Hcl) .Marland Kitchen.. 1 Tab By Mouth Q 12 Hrs 15)  Gabapentin 600 Mg Tabs (Gabapentin) .Marland Kitchen.. 1 Tab By Mouth Tid 16)  Nitro-Dur 0.4 Mg/hr Pt24 (Nitroglycerin) .Marland Kitchen.. 1 Tab By Mouth Q 5 Min X 3 As Needed Chest Pain 17)  Levothroid 75 Mcg Tabs (Levothyroxine Sodium) .Marland Kitchen.. 1 Tab By Mouth Daily 18)  Coumadin 5 Mg Tabs (Warfarin Sodium) .... Take 1 Tab By Mouth Once Daily As Directed 19)  Hydrocodone-Acetaminophen 5-325 Mg Tabs (Hydrocodone-Acetaminophen) .Marland Kitchen.. 1 Tab By Mouth Q 6 Hrs As Needed Back Pain; Take With Food  Allergies (verified): 1)  ! Ace Inhibitors 2)  ! Monopril 3)  ! Bactrim  Past History:  Past Medical  History: Reviewed history from 01/23/2010 and no changes required. hx of kidney stones Current Problems:  COLONIC POLYPS, HYPERPLASTIC, HX OF (ICD-V12.72) A. FIB (Dr Allyson Sabal) 12-2009 GASTRIC ULCER, HX OF (ICD-V12.71) Hx of ESOPHAGEAL STRICTURE (ICD-530.3) HIATAL HERNIA (ICD-553.3) Family Hx of COLON CANCER (ICD-153.9) DIARRHEA (ICD-787.91) MEMORY LOSS (ICD-780.93) OTHER CATARACT (ICD-366.8) CHRONIC DIASTOLIC HEART FAILURE (ICD-428.32) - 2-D echo 1/08 EF 60% MICROALBUMINURIA (ICD-791.0) CAD (ICD-414.00) - normal cath 2/10 by Dr. Allyson Sabal ATHEROSCLEROSIS NOS (ICD-440.9) PERNICIOUS ANEMIA (ICD-281.0) DM W/NEURO MANIFESTATIONS, TYPE II (ICD-250.60) KIDNEY DISEASE, CHRONIC, STAGE III (ICD-585.3) CYSTITIS, CHRONIC (ICD-595.2) OSTEOARTHROSIS, GENERALIZED, MULTIPLE SITES (ICD-715.09) SWELLING, LIMB (ICD-729.81) BACK PAIN (ICD-724.5) PEPTIC ULCER DIS., UNSPEC. W/O OBSTRUCTION  (ICD-533.90) SENILE OSTEOPOROSIS (ICD-733.01) IRRITABLE BOWEL SYNDROME (ICD-564.1) HYPERTENSION, BENIGN SYSTEMIC (ICD-401.1)  L foot fx (Dr Margaretha Sheffield)  Past Surgical History: Reviewed history from 03/04/2010 and no changes required. 2D echo- LVEF 55-60% 1-08  abnml LV relaxation, mild AVR, RV mildly dilated  Appendectomy  carotid dopplers- no significant stenosis - 2007  EGD-- esophageal stricture, gastric ulcer  T&A moderate calcification of the right coronary and left anterior descending arteries.  On CT scan 03/2007  normal heart cath 01/2009 Dr Allyson Sabal. Cataract Extraction- Both eyes pacemaker placement Feb 2011  Social History: Reviewed history from 03/04/2010 and no changes required. Widowed x 21 yrs.  Retired from [employer].  Has 2 daughters- one in [place] and one in [place].  Lives w/ daughter [name] [phone]) in [place] and her son in law who had a stroke.  Nonsmoker.  Denies ETOH. Trying to walk more.  Has 2 living sibblings.  Review of Systems      See HPI  Physical Exam  General:  alert, well-developed, well-nourished, and overweight-appearing.   Head:  normocephalic and atraumatic.   Mouth:  pharynx pink and moist.   Neck:  no masses.   Lungs:  Normal respiratory effort, chest expands symmetrically. Lungs are clear to auscultation, no crackles or wheezes. Heart:  RRR with 2/6 SEM Abdomen:  soft, non-tender, normal bowel sounds, no distention, no masses, and no guarding.   Pulses:  2+ pedal pulses Extremities:  1+ non pitting bilat LE edema with much improved L foot ulceration -- completely healed.    Neurologic:  no focal neurologic deficitis Skin:  color normal.   Cervical Nodes:  No lymphadenopathy noted Psych:  good eye contact, not anxious appearing, and not depressed appearing.     Impression & Recommendations:  Problem # 1:  VENOUS STASIS ULCER (ICD-454.0) Assessment Improved Much improved!  No further tx needed.  Problem # 2:  HYPERGLYCEMIA  (ICD-790.29) She had been doing very well with her blood sugar at her last DM f/u visit but lately, she has been running in the 400s despite what she says is compliance with medication compliance and little food intake.  This could be from an acute UTI given her hx.  UA is + for LEs today so will cover for UTI and f/u the urine cx.  She is to use her Novolog for SSI and will keep Lantus unchanged for now.  SSI schedule given.   Her updated medication list for this problem includes:    Lantus Solostar 100 Unit/ml Soln (Insulin glargine) .Marland KitchenMarland KitchenMarland KitchenMarland Kitchen 30  units [place] injection qhs    Novolog Flexpen Soln (Insulin aspart soln) .Marland KitchenMarland KitchenMarland KitchenMarland Kitchen 5 units subcutaneous with breakfast, 6 units subcutaneous with lunch, and  8 units subcutaneous with dinner  Complete Medication List: 1)  Aspirin 81 Mg Tbec (Aspirin) .... Take 1 tablet by mouth once a  day 2)  Oystercal 500 + D 500-125 Mg-unit Tabs (Calcium carbonate-vitamin d) .... One by mouth twice daily with meals 3)  Hyoscyamine Sulfate Cr 0.375 Mg Cp12 (Hyoscyamine sulfate) .... Take 1 tablet by mouth once a day 4)  Lantus Solostar 100 Unit/ml Soln (Insulin glargine) .... 30  units [place] injection qhs 5)  Metoprolol Succinate 25 Mg Xr24h-tab (Metoprolol succinate) .Marland Kitchen.. 1 tab by mouth daily 6)  Novolog Flexpen Soln (Insulin aspart soln) .... 5 units subcutaneous with breakfast, 6 units subcutaneous with lunch, and  8 units subcutaneous with dinner 7)  Lasix 40 Mg Tabs (Furosemide) .... 1/2  tab by mouth daily 8)  Pravastatin Sodium 40 Mg Tabs (Pravastatin sodium) .Marland Kitchen.. 1 tab by mouth qhs 9)  Ulticare Pen Needles 29g X 12mm Misc (Insulin pen needle) .... Use as directed 4 x a day 10)  Easy Comfort Insulin Syringe 30g X 5/16" 0.5 Ml Misc (Insulin syringe-needle u-100) .... Use as directed 11)  Restoril 15 Mg Caps (Temazepam) .Marland Kitchen.. 1 tab by mouth at bedtime as needed sleep 12)  Azor 10-40 Mg Tabs (Amlodipine-olmesartan) .Marland Kitchen.. 1 tab by mouth daily 13)  Pantoprazole Sodium 40 Mg Tbec  (Pantoprazole sodium) .... Take 1 tab by mouth once daily 14)  Clonidine Hcl 0.1 Mg Tabs (Clonidine hcl) .Marland Kitchen.. 1 tab by mouth q 12 hrs 15)  Gabapentin 600 Mg Tabs (Gabapentin) .Marland Kitchen.. 1 tab by mouth tid 16)  Nitro-dur 0.4 Mg/hr Pt24 (Nitroglycerin) .Marland Kitchen.. 1 tab by mouth q 5 min x 3 as needed chest pain 17)  Levothroid 75 Mcg Tabs (Levothyroxine sodium) .Marland Kitchen.. 1 tab by mouth daily 18)  Coumadin 5 Mg Tabs (Warfarin sodium) .... Take 1 tab by mouth once daily as directed 19)  Hydrocodone-acetaminophen 5-325 Mg Tabs (Hydrocodone-acetaminophen) .Marland Kitchen.. 1 tab by mouth q 6 hrs as needed back pain; take with food 20)  Levaquin 250 Mg Tabs (Levofloxacin) .Marland Kitchen.. 1 tab by mouth daily x 3 days  Other Orders: T-Culture, Urine ([id number]) UA Dipstick w/o Micro (automated)  (09811)  Patient Instructions: 1)  I put a call into Dr Allyson Sabal re: Amiodorone. 2)  Stay on current meds. 3)  Add Levaquin for presumed UTI -- urine culture will be back by Friday. 4)  Make sure you hydrate with water. 5)  For high sugars, use your NOVOLOG for sliding scale insulin. 6)  Call if any problems. 7)  If not feeling better in 1 wk, will get some additional labs done. Prescriptions: LEVAQUIN 250 MG TABS (LEVOFLOXACIN) 1 tab by mouth daily x 3 days  #3 tabs x 0   Entered and Authorized by:   Seymour Bars DO   Signed by:   Seymour Bars DO on 11/11/2010   Method used:   Electronically to        [facility] [phone]* (retail)       [address] [place], [place]  [address]       Ph: [phone]       Fax: [phone]   RxID:   [id number]    Orders Added: 1)  T-Culture, Urine [[id number]] 2)  UA Dipstick w/o Micro (automated)  [[id number]] 3)  Est. Patient Level IV [38756]    Prevention & Chronic Care Immunizations   Influenza vaccine: Fluvax 3+  (10/07/2010)   Influenza vaccine due: 10/02/2009    Tetanus booster: 10/03/2006: Td   Tetanus booster due: 10/03/2016    Pneumococcal vaccine: Historical  (02/25/1996)    Pneumococcal  vaccine due: None    H. zoster vaccine: Not documented  Colorectal Screening   Hemoccult: Not documented    Colonoscopy: Location:  [facility].    (12/04/2008)   Colonoscopy due: 12/04/2013  Other Screening   Pap smear: Not documented    Mammogram: Done  (10/27/2005)   Mammogram due: 10/27/2006    DXA bone density scan: Not documented   Smoking status: never  (10/25/2006)  Diabetes Mellitus   HgbA1C: 7.3  (10/07/2010)   Hemoglobin A1C due: 01/02/2009    Eye exam: normal  (09/11/2008)   Eye exam due: 09/11/2009    Foot exam: yes  (09/05/2009)   High risk foot: Not documented   Foot care education: Not documented   Foot exam due: 06/26/2009    Urine microalbumin/creatinine ratio: Not documented   Urine microalbumin/cr due: 06/26/2009  Lipids   Total Cholesterol: 139  (02/13/2008)   LDL: 71  (02/13/2008)   LDL Direct: 73  (08/07/2009)   HDL: 38  (02/13/2008)   Triglycerides: 149  (02/13/2008)  Hypertension   Last Blood Pressure: 113 / 69  (11/11/2010)   Serum creatinine: 1.38  (06/16/2010)   Serum potassium 4.6  (06/16/2010)  Self-Management Support :    Diabetes self-management support: Not documented    Hypertension self-management support: Not documented  Laboratory Results   Urine Tests    Routine Urinalysis   Color: yellow Appearance: Clear Glucose: negative   (Normal Range: Negative) Bilirubin: negative   (Normal Range: Negative) Ketone: negative   (Normal Range: Negative) Spec. Gravity: 1.015   (Normal Range: 1.003-1.035) Blood: negative   (Normal Range: Negative) pH: 6.0   (Normal Range: 5.0-8.0) Protein: negative   (Normal Range: Negative) Urobilinogen: 0.2   (Normal Range: 0-1) Nitrite: negative   (Normal Range: Negative) Leukocyte Esterace: moderate   (Normal Range: Negative)

## 2011-01-28 NOTE — Letter (Signed)
Summary: [facility]  [facility]   Imported By: Lanelle Bal 03/09/2010 13:33:21  _____________________________________________________________________  External Attachment:    Type:   Image     Comment:   External Document

## 2011-02-10 ENCOUNTER — Ambulatory Visit
Admission: RE | Admit: 2011-02-10 | Discharge: 2011-02-10 | Disposition: A | Discharge status: Home / Self Care | Payer: MEDICARE | Source: Ambulatory Visit | Attending: Family Medicine | Admitting: Family Medicine

## 2011-02-10 ENCOUNTER — Other Ambulatory Visit: Payer: Self-pay | Admitting: Family Medicine

## 2011-02-10 ENCOUNTER — Ambulatory Visit (INDEPENDENT_AMBULATORY_CARE_PROVIDER_SITE_OTHER): Payer: MEDICARE | Admitting: Family Medicine

## 2011-02-10 ENCOUNTER — Encounter: Payer: Self-pay | Admitting: Family Medicine

## 2011-02-10 DIAGNOSIS — R51 Headache: Secondary | ICD-10-CM | POA: Insufficient documentation

## 2011-02-10 DIAGNOSIS — R519 Headache, unspecified: Secondary | ICD-10-CM | POA: Insufficient documentation

## 2011-02-10 DIAGNOSIS — E1149 Type 2 diabetes mellitus with other diabetic neurological complication: Secondary | ICD-10-CM

## 2011-02-10 DIAGNOSIS — I4891 Unspecified atrial fibrillation: Secondary | ICD-10-CM

## 2011-02-10 DIAGNOSIS — M542 Cervicalgia: Secondary | ICD-10-CM | POA: Insufficient documentation

## 2011-02-10 LAB — CONVERTED CEMR LAB: Hgb A1c MFr Bld: 7.5 %

## 2011-02-12 ENCOUNTER — Ambulatory Visit
Admission: RE | Admit: 2011-02-12 | Discharge: 2011-02-12 | Disposition: A | Discharge status: Home / Self Care | Payer: MEDICARE | Source: Ambulatory Visit | Attending: Family Medicine | Admitting: Family Medicine

## 2011-02-12 ENCOUNTER — Other Ambulatory Visit: Payer: Self-pay | Admitting: Family Medicine

## 2011-02-12 DIAGNOSIS — M542 Cervicalgia: Secondary | ICD-10-CM

## 2011-02-17 NOTE — Assessment & Plan Note (Signed)
Summary: f/u DM/ HAs   Vital Signs:  Patient profile:   77 year old female Height:      68 inches Weight:      194 pounds BMI:     29.60 O2 Sat:      96 % on Room air Pulse rate:   69 / minute BP sitting:   137 / 76  (left arm) Cuff size:   regular  Vitals Entered By: Payton Spark CMA (February 10, 2011 1:43 PM)  O2 Flow:  Room air CC: F/U DM   Primary Care Provider:  Seymour Bars DO  CC:  F/U DM.  History of Present Illness: 77 yo WF presents for f/u DM.  She is doing well w/ her sugars and her A1C is 7.3  She is currently on Lantus 30 units once daily with mealtime Novolog which she is only sometimes using.  She still has sporadic dietary adherence causing some HIGH readings.  Denies lows.  Not getting any exercise.  Complicated by neuropathy, on gabapending.    She is having pain in the L thumb x 5 days but she does not remember any trauma.  She has some had some soft tissue swelling and pain.  Has seen Dr Margaretha Sheffield in the past.  She has been having more frequent HAs, occuring for weeks, better with tylenol and aching over the top of her scalp.   She does not usually have HAs.  Denies any vision changes, light sensitivity or nausea associated with the HA.  Tbey do not seem to be related to time of the day.    Her diastolic heart failure, A Fib s/p pacer placement is fairly stable.  Denies chest pain, SOB or palptiations.  She sees the coumadin clinic and Dr Allyson Sabal for f/u.  She is on Lasix daily, ASA, coumadin, AZOR, statin.    Current Medications (verified): 1)  Aspirin 81 Mg Tbec (Aspirin) .... Take 1 Tablet By Mouth Once A Day 2)  Oystercal 500 + D 500-125 Mg-Unit Tabs (Calcium Carbonate-Vitamin D) .... One By Mouth Twice Daily With Meals 3)  Lantus Solostar 100 Unit/ml Soln (Insulin Glargine) .... 30  Units [place] Injection Qhs 4)  Metoprolol Succinate 25 Mg Xr24h-Tab (Metoprolol Succinate) .Marland Kitchen.. 1 Tab By Mouth Daily 5)  Novolog Flexpen  Soln (Insulin Aspart Soln) .... 5 Units  Subcutaneous With Breakfast, 6 Units Subcutaneous With Lunch, and  8 Units Subcutaneous With Dinner 6)  Lasix 40 Mg Tabs (Furosemide) .... 1/2  Tab By Mouth Daily 7)  Pravastatin Sodium 40 Mg Tabs (Pravastatin Sodium) .Marland Kitchen.. 1 Tab By Mouth Qhs 8)  Ulticare Pen Needles 29g X 12mm  Misc (Insulin Pen Needle) .... Use As Directed 4 X A Day 9)  Easy Comfort Insulin Syringe 30g X 5/16" 0.5 Ml  Misc (Insulin Syringe-Needle U-100) .... Use As Directed 10)  Restoril 15 Mg Caps (Temazepam) .Marland Kitchen.. 1 Tab By Mouth At Bedtime As Needed Sleep 11)  Azor 10-40 Mg Tabs (Amlodipine-Olmesartan) .Marland Kitchen.. 1 Tab By Mouth Daily 12)  Pantoprazole Sodium 40 Mg Tbec (Pantoprazole Sodium) .... Take 1 Tab By Mouth Once Daily 13)  Clonidine Hcl 0.1 Mg Tabs (Clonidine Hcl) .Marland Kitchen.. 1 Tab By Mouth Q 12 Hrs 14)  Gabapentin 600 Mg Tabs (Gabapentin) .Marland Kitchen.. 1 Tab By Mouth Tid 15)  Nitro-Dur 0.4 Mg/hr Pt24 (Nitroglycerin) .Marland Kitchen.. 1 Tab By Mouth Q 5 Min X 3 As Needed Chest Pain 16)  Levothroid 75 Mcg Tabs (Levothyroxine Sodium) .Marland Kitchen.. 1 Tab By Mouth Daily 17)  Coumadin 5 Mg Tabs (Warfarin Sodium) .... Take 1 Tab By Mouth Once Daily As Directed 18)  Hydrocodone-Acetaminophen 5-325 Mg Tabs (Hydrocodone-Acetaminophen) .Marland Kitchen.. 1 Tab By Mouth Q 6 Hrs As Needed Back Pain; Take With Food 19)  Levaquin 250 Mg Tabs (Levofloxacin) .Marland Kitchen.. 1 Tab By Mouth Daily X 3 Days  Allergies (verified): 1)  ! Ace Inhibitors 2)  ! Monopril 3)  ! Bactrim  Past History:  Past Medical History: Reviewed history from 01/23/2010 and no changes required. hx of kidney stones Current Problems:  COLONIC POLYPS, HYPERPLASTIC, HX OF (ICD-V12.72) A. FIB (Dr Allyson Sabal) 12-2009 GASTRIC ULCER, HX OF (ICD-V12.71) Hx of ESOPHAGEAL STRICTURE (ICD-530.3) HIATAL HERNIA (ICD-553.3) Family Hx of COLON CANCER (ICD-153.9) DIARRHEA (ICD-787.91) MEMORY LOSS (ICD-780.93) OTHER CATARACT (ICD-366.8) CHRONIC DIASTOLIC HEART FAILURE (ICD-428.32) - 2-D echo 1/08 EF 60% MICROALBUMINURIA (ICD-791.0) CAD  (ICD-414.00) - normal cath 2/10 by Dr. Allyson Sabal ATHEROSCLEROSIS NOS (ICD-440.9) PERNICIOUS ANEMIA (ICD-281.0) DM W/NEURO MANIFESTATIONS, TYPE II (ICD-250.60) KIDNEY DISEASE, CHRONIC, STAGE III (ICD-585.3) CYSTITIS, CHRONIC (ICD-595.2) OSTEOARTHROSIS, GENERALIZED, MULTIPLE SITES (ICD-715.09) SWELLING, LIMB (ICD-729.81) BACK PAIN (ICD-724.5) PEPTIC ULCER DIS., UNSPEC. W/O OBSTRUCTION (ICD-533.90) SENILE OSTEOPOROSIS (ICD-733.01) IRRITABLE BOWEL SYNDROME (ICD-564.1) HYPERTENSION, BENIGN SYSTEMIC (ICD-401.1)  L foot fx (Dr Margaretha Sheffield)  Past Surgical History: Reviewed history from 03/04/2010 and no changes required. 2D echo- LVEF 55-60% 1-08  abnml LV relaxation, mild AVR, RV mildly dilated  Appendectomy  carotid dopplers- no significant stenosis - 2007  EGD-- esophageal stricture, gastric ulcer  T&A moderate calcification of the right coronary and left anterior descending arteries.  On CT scan 03/2007  normal heart cath 01/2009 Dr Allyson Sabal. Cataract Extraction- Both eyes pacemaker placement Feb 2011  Social History: Reviewed history from 03/04/2010 and no changes required. Widowed x 21 yrs.  Retired from [employer].  Has 2 daughters- one in [place] and one in [place].  Lives w/ daughter [name] [phone]) in [place] and her son in law who had a stroke.  Nonsmoker.  Denies ETOH. Trying to walk more.  Has 2 living sibblings.  Review of Systems      See HPI  Physical Exam  General:  alert, well-developed, well-nourished, well-hydrated, and overweight-appearing.  here with daughter Head:  normocephalic and atraumatic.  no palpable head or scalp tenderness or nodules.  no audible temporal artery bruits Eyes:  pupils equal, pupils round, and pupils reactive to light.   Mouth:  pharynx pink and moist.   Neck:  no masses.   Lungs:  Normal respiratory effort, chest expands symmetrically. Lungs are clear to auscultation, no crackles or wheezes. Heart:  RRR with 2/6 SEM Msk:  some limitation in C spine  rotation and SB Pulses:  2+ radial pulses Extremities:  1+ pitting LE edema bilat Neurologic:  no tremor; cranial nerves II-XII intact and gait normal.   Skin:  color normal.   Cervical Nodes:  No lymphadenopathy noted Psych:  good eye contact, not anxious appearing, and not depressed appearing.     Impression & Recommendations:  Problem # 1:  CEPHALGIA (ICD-784.0) Assessment New New onset atypical HAs.  Will obtain CT head w/o contrast.  Unable to MRI b/c she has a pacemaker. Will also look at her neck.  BP well controlled.  Sugars are a bit erradic.  No new start meds to blame on new onset HAs.  OK to use Tylenol for now which does seem to help and luckily, her neuro exam is normal.   Her updated medication list for this problem includes:    Aspirin 81 Mg  Tbec (Aspirin) .Marland Kitchen... Take 1 tablet by mouth once a day    Metoprolol Succinate 25 Mg Xr24h-tab (Metoprolol succinate) .Marland Kitchen... 1 tab by mouth daily    Hydrocodone-acetaminophen 5-325 Mg Tabs (Hydrocodone-acetaminophen) .Marland Kitchen... 1 tab by mouth q 6 hrs as needed back pain; take with food  Orders: T-CT Head w/o cm (40981)  Problem # 2:  NECK PAIN, CHRONIC (ICD-723.1) Neck pain/ arthritis is likely to be a cause for her HAs.  Will obtain xray since insurance would not cover CT.  Unable to take NSAIDs on Coumadin. Her updated medication list for this problem includes:    Aspirin 81 Mg Tbec (Aspirin) .Marland Kitchen... Take 1 tablet by mouth once a day    Hydrocodone-acetaminophen 5-325 Mg Tabs (Hydrocodone-acetaminophen) .Marland Kitchen... 1 tab by mouth q 6 hrs as needed back pain; take with food  Orders: T-CT Neck w/o cm (19147)  Problem # 3:  DM W/NEURO MANIFESTATIONS, TYPE II (ICD-250.60) Fair control by A1C.  Ran low on higher dose of insulin and will probably never get tight control due to dietary non adherence.  Will continue current plan.  I gave her daughter a h/o on medium range SSI to use for HIGHS (>200) with her Novolog 1x only.   Her updated medication  list for this problem includes:    Aspirin 81 Mg Tbec (Aspirin) .Marland Kitchen... Take 1 tablet by mouth once a day    Lantus Solostar 100 Unit/ml Soln (Insulin glargine) .Marland KitchenMarland KitchenMarland KitchenMarland Kitchen 30  units [place] injection qhs    Novolog Flexpen Soln (Insulin aspart soln) .Marland KitchenMarland KitchenMarland KitchenMarland Kitchen 5 units subcutaneous with breakfast, 6 units subcutaneous with lunch, and  8 units subcutaneous with dinner    Azor 10-40 Mg Tabs (Amlodipine-olmesartan) .Marland Kitchen... 1 tab by mouth daily  Orders: Fingerstick (36416) Hgb A1C ([id number])  Problem # 4:  ATRIAL FIBRILLATION (ICD-427.31) Stable on current meds, sees coumadin clinic and has a f/u with Dr Allyson Sabal coming up soon. Her updated medication list for this problem includes:    Aspirin 81 Mg Tbec (Aspirin) .Marland Kitchen... Take 1 tablet by mouth once a day    Metoprolol Succinate 25 Mg Xr24h-tab (Metoprolol succinate) .Marland Kitchen... 1 tab by mouth daily    Azor 10-40 Mg Tabs (Amlodipine-olmesartan) .Marland Kitchen... 1 tab by mouth daily    Coumadin 5 Mg Tabs (Warfarin sodium) .Marland Kitchen... Take 1 tab by mouth once daily as directed  Complete Medication List: 1)  Aspirin 81 Mg Tbec (Aspirin) .... Take 1 tablet by mouth once a day 2)  Oystercal 500 + D 500-125 Mg-unit Tabs (Calcium carbonate-vitamin d) .... One by mouth twice daily with meals 3)  Lantus Solostar 100 Unit/ml Soln (Insulin glargine) .... 30  units [place] injection qhs 4)  Metoprolol Succinate 25 Mg Xr24h-tab (Metoprolol succinate) .Marland Kitchen.. 1 tab by mouth daily 5)  Novolog Flexpen Soln (Insulin aspart soln) .... 5 units subcutaneous with breakfast, 6 units subcutaneous with lunch, and  8 units subcutaneous with dinner 6)  Lasix 40 Mg Tabs (Furosemide) .... 1/2  tab by mouth daily 7)  Pravastatin Sodium 40 Mg Tabs (Pravastatin sodium) .Marland Kitchen.. 1 tab by mouth qhs 8)  Ulticare Pen Needles 29g X 12mm Misc (Insulin pen needle) .... Use as directed 4 x a day 9)  Easy Comfort Insulin Syringe 30g X 5/16" 0.5 Ml Misc (Insulin syringe-needle u-100) .... Use as directed 10)  Restoril 15 Mg Caps (Temazepam)  .Marland Kitchen.. 1 tab by mouth at bedtime as needed sleep 11)  Azor 10-40 Mg Tabs (Amlodipine-olmesartan) .Marland Kitchen.. 1 tab by mouth daily 12)  Pantoprazole Sodium 40 Mg Tbec (Pantoprazole sodium) .... Take 1 tab by mouth once daily 13)  Clonidine Hcl 0.1 Mg Tabs (Clonidine hcl) .Marland Kitchen.. 1 tab by mouth q 12 hrs 14)  Gabapentin 600 Mg Tabs (Gabapentin) .Marland Kitchen.. 1 tab by mouth tid 15)  Nitro-dur 0.4 Mg/hr Pt24 (Nitroglycerin) .Marland Kitchen.. 1 tab by mouth q 5 min x 3 as needed chest pain 16)  Levothroid 75 Mcg Tabs (Levothyroxine sodium) .Marland Kitchen.. 1 tab by mouth daily 17)  Coumadin 5 Mg Tabs (Warfarin sodium) .... Take 1 tab by mouth once daily as directed 18)  Hydrocodone-acetaminophen 5-325 Mg Tabs (Hydrocodone-acetaminophen) .Marland Kitchen.. 1 tab by mouth q 6 hrs as needed back pain; take with food  Patient Instructions: 1)  Stay on current meds. 2)  Sliding scale for occasional Highs given -- use Novolog (only) for this and administer only 1 x ; recheck sugar 30 min later. 3)  Will get CT head/ neck and I will call you w/ results. 4)  Referral to Dr Margaretha Sheffield made for tenosynovitis. 5)  Return for f/u HAs in 3 wks.   Orders Added: 1)  Fingerstick [36416] 2)  Hgb A1C [[id number]] 3)  T-CT Head w/o cm [70450] 4)  T-CT Neck w/o cm [70490] 5)  Est. Patient Level IV [65784]    Laboratory Results   Blood Tests     HGBA1C: 7.5%   (Normal Range: Non-Diabetic - 3-6%   Control Diabetic - 6-8%)

## 2011-03-10 ENCOUNTER — Other Ambulatory Visit: Payer: Self-pay | Admitting: Family Medicine

## 2011-03-10 ENCOUNTER — Telehealth: Payer: Self-pay | Admitting: Family Medicine

## 2011-03-10 ENCOUNTER — Ambulatory Visit (INDEPENDENT_AMBULATORY_CARE_PROVIDER_SITE_OTHER): Payer: MEDICARE | Admitting: Family Medicine

## 2011-03-10 ENCOUNTER — Ambulatory Visit
Admission: RE | Admit: 2011-03-10 | Discharge: 2011-03-10 | Disposition: A | Discharge status: Home / Self Care | Payer: MEDICARE | Source: Ambulatory Visit | Attending: Family Medicine | Admitting: Family Medicine

## 2011-03-10 ENCOUNTER — Encounter: Payer: Self-pay | Admitting: Family Medicine

## 2011-03-10 DIAGNOSIS — N390 Urinary tract infection, site not specified: Secondary | ICD-10-CM

## 2011-03-10 DIAGNOSIS — R630 Anorexia: Secondary | ICD-10-CM

## 2011-03-10 DIAGNOSIS — R7309 Other abnormal glucose: Secondary | ICD-10-CM

## 2011-03-10 DIAGNOSIS — E1149 Type 2 diabetes mellitus with other diabetic neurological complication: Secondary | ICD-10-CM

## 2011-03-10 DIAGNOSIS — R634 Abnormal weight loss: Secondary | ICD-10-CM

## 2011-03-10 LAB — CONVERTED CEMR LAB
Glucose, Urine, Semiquant: NEGATIVE
Specific Gravity, Urine: >=1.03
pH: 5.5

## 2011-03-11 ENCOUNTER — Encounter: Payer: Self-pay | Admitting: Family Medicine

## 2011-03-11 LAB — CONVERTED CEMR LAB
ALT: 9 units/L (ref 0–35)
AST: 20 units/L (ref 0–37)
Albumin: 4.7 g/dL (ref 3.5–5.2)
Basophils Absolute: 0.1 10*3/uL (ref 0.0–0.1)
CO2: 24 meq/L (ref 19–32)
Calcium: 9.3 mg/dL (ref 8.4–10.5)
Chloride: 104 meq/L (ref 96–112)
Creatinine, Ser: 1.65 mg/dL — ABNORMAL HIGH (ref 0.40–1.20)
Hemoglobin: 13.1 g/dL (ref 12.0–15.0)
LDH: 246 units/L (ref 94–250)
Lymphocytes Relative: 18 % (ref 12–46)
Monocytes Absolute: 0.7 10*3/uL (ref 0.1–1.0)
Neutro Abs: 6.6 10*3/uL (ref 1.7–7.7)
Neutrophils Relative %: 71 % (ref 43–77)
Potassium: 4.6 meq/L (ref 3.5–5.3)
RBC: 4.11 M/uL (ref 3.87–5.11)
RDW: 14.2 % (ref 11.5–15.5)
TSH: 4.803 microintl units/mL — ABNORMAL HIGH (ref 0.350–4.500)
Total Protein: 6.7 g/dL (ref 6.0–8.3)

## 2011-03-14 LAB — BASIC METABOLIC PANEL
BUN: 21 mg/dL (ref 6–23)
BUN: 22 mg/dL (ref 6–23)
CO2: 27 mEq/L (ref 19–32)
CO2: 28 mEq/L (ref 19–32)
CO2: 29 mEq/L (ref 19–32)
Calcium: 9.3 mg/dL (ref 8.4–10.5)
Calcium: 9.4 mg/dL (ref 8.4–10.5)
Chloride: 107 mEq/L (ref 96–112)
Chloride: 107 mEq/L (ref 96–112)
Chloride: 109 mEq/L (ref 96–112)
Creatinine, Ser: 1.37 mg/dL — ABNORMAL HIGH (ref 0.4–1.2)
Creatinine, Ser: 1.47 mg/dL — ABNORMAL HIGH (ref 0.4–1.2)
GFR calc Af Amer: 45 mL/min — ABNORMAL LOW (ref >60)
GFR calc Af Amer: 46 mL/min — ABNORMAL LOW (ref >60)
GFR calc non Af Amer: 37 mL/min — ABNORMAL LOW (ref >60)
GFR calc non Af Amer: 38 mL/min — ABNORMAL LOW (ref >60)
Glucose, Bld: 145 mg/dL — ABNORMAL HIGH (ref 70–99)
Glucose, Bld: 159 mg/dL — ABNORMAL HIGH (ref 70–99)
Potassium: 4.2 mEq/L (ref 3.5–5.1)
Sodium: 141 mEq/L (ref 135–145)

## 2011-03-14 LAB — GLUCOSE, CAPILLARY
Glucose-Capillary: 108 mg/dL — ABNORMAL HIGH (ref 70–99)
Glucose-Capillary: 112 mg/dL — ABNORMAL HIGH (ref 70–99)
Glucose-Capillary: 120 mg/dL — ABNORMAL HIGH (ref 70–99)
Glucose-Capillary: 126 mg/dL — ABNORMAL HIGH (ref 70–99)
Glucose-Capillary: 126 mg/dL — ABNORMAL HIGH (ref 70–99)
Glucose-Capillary: 130 mg/dL — ABNORMAL HIGH (ref 70–99)
Glucose-Capillary: 219 mg/dL — ABNORMAL HIGH (ref 70–99)
Glucose-Capillary: 245 mg/dL — ABNORMAL HIGH (ref 70–99)
Glucose-Capillary: 256 mg/dL — ABNORMAL HIGH (ref 70–99)

## 2011-03-14 LAB — CBC
HCT: 34.5 % — ABNORMAL LOW (ref 36.0–46.0)
HCT: 35.1 % — ABNORMAL LOW (ref 36.0–46.0)
Hemoglobin: 12.3 g/dL (ref 12.0–15.0)
Hemoglobin: 13.1 g/dL (ref 12.0–15.0)
Hemoglobin: 13.1 g/dL (ref 12.0–15.0)
MCHC: 34.7 g/dL (ref 30.0–36.0)
MCHC: 34.8 g/dL (ref 30.0–36.0)
MCHC: 34.8 g/dL (ref 30.0–36.0)
MCV: 98.2 fL (ref 78.0–100.0)
MCV: 98.6 fL (ref 78.0–100.0)
Platelets: 239 10*3/uL (ref 150–400)
RBC: 3.81 MIL/uL — ABNORMAL LOW (ref 3.87–5.11)
RDW: 13.6 % (ref 11.5–15.5)
RDW: 13.6 % (ref 11.5–15.5)
RDW: 13.9 % (ref 11.5–15.5)
WBC: 8.4 10*3/uL (ref 4.0–10.5)

## 2011-03-14 LAB — DIFFERENTIAL
Lymphocytes Relative: 32 % (ref 12–46)
Lymphs Abs: 2.7 10*3/uL (ref 0.7–4.0)
Monocytes Absolute: 0.8 10*3/uL (ref 0.1–1.0)
Monocytes Relative: 10 % (ref 3–12)
Neutro Abs: 4.5 10*3/uL (ref 1.7–7.7)

## 2011-03-14 LAB — PROTIME-INR
INR: 1.16 (ref 0.00–1.49)
INR: 1.93 — ABNORMAL HIGH (ref 0.00–1.49)
Prothrombin Time: 14.7 seconds (ref 11.6–15.2)
Prothrombin Time: 17.1 seconds — ABNORMAL HIGH (ref 11.6–15.2)

## 2011-03-14 LAB — HEPARIN LEVEL (UNFRACTIONATED)
Heparin Unfractionated: 0.65 IU/mL (ref 0.30–0.70)
Heparin Unfractionated: 0.68 IU/mL (ref 0.30–0.70)

## 2011-03-15 ENCOUNTER — Ambulatory Visit (INDEPENDENT_AMBULATORY_CARE_PROVIDER_SITE_OTHER): Payer: MEDICARE | Admitting: Family Medicine

## 2011-03-15 ENCOUNTER — Other Ambulatory Visit: Payer: Self-pay | Admitting: Family Medicine

## 2011-03-15 ENCOUNTER — Encounter: Payer: Self-pay | Admitting: Family Medicine

## 2011-03-15 DIAGNOSIS — R5381 Other malaise: Secondary | ICD-10-CM

## 2011-03-15 DIAGNOSIS — N390 Urinary tract infection, site not specified: Secondary | ICD-10-CM

## 2011-03-15 LAB — CONVERTED CEMR LAB
Bilirubin Urine: NEGATIVE
Glucose, Urine, Semiquant: NEGATIVE
WBC Urine, dipstick: NEGATIVE
pH: 5.5

## 2011-03-16 ENCOUNTER — Encounter: Payer: Self-pay | Admitting: Family Medicine

## 2011-03-16 NOTE — Progress Notes (Signed)
Summary: f/u with Dr.Draper is scheduled   ---- Converted from flag ---- ---- 03/10/2011 11:12 AM, Michaelle Copas wrote: Pt is scheduled for here in our building w/Dr.Draper on 03/18/11 at 1:15 and she is aware of her appt info.[name]  ---- 02/10/2011 2:14 PM, [place] Bars DO wrote: pt needs a f/u with Dr Margaretha Sheffield -- this time for DeQuervains tenosynovitis. ------------------------------

## 2011-03-16 NOTE — Assessment & Plan Note (Signed)
Summary: not feeling well   Vital Signs:  Patient profile:   77 year old female Height:      68 inches Weight:      190 pounds BMI:     28.99 O2 Sat:      94 % on Room air Pulse rate:   82 / minute BP sitting:   132 / 70  (left arm) Cuff size:   regular  Vitals Entered By: Payton Spark CMA (March 10, 2011 2:00 PM)  O2 Flow:  Room air CC: F/U HAs.    Primary Care Provider:  [place] Bars DO  CC:  F/U HAs. .  History of Present Illness: 77 yo WF presents for f/u visit.  She continues to have a drop in her appetite, down 4 lbs.  She is getting early satiety but denies abd pain.  Has chronic diarrrhea but no blood in her stool.  Denies cough, fevers or chills.  she feels more tired than normal and she always has pain in her feet.  She is not sleeping well due to diabetic neuropathic foot pain.  On gabapentin 600 mg three times a day but still having pain.  Used up her vicodin and wants RF.  sugars running HIGH - in the 400s despite use of Lantus + Novolog.  Long hx of dietary non adherence.  Allergies: 1)  ! Ace Inhibitors 2)  ! Monopril 3)  ! Bactrim  Past History:  Past Medical History: Reviewed history from 01/23/2010 and no changes required. hx of kidney stones Current Problems:  COLONIC POLYPS, HYPERPLASTIC, HX OF (ICD-V12.72) A. FIB (Dr Allyson Sabal) 12-2009 GASTRIC ULCER, HX OF (ICD-V12.71) Hx of ESOPHAGEAL STRICTURE (ICD-530.3) HIATAL HERNIA (ICD-553.3) Family Hx of COLON CANCER (ICD-153.9) DIARRHEA (ICD-787.91) MEMORY LOSS (ICD-780.93) OTHER CATARACT (ICD-366.8) CHRONIC DIASTOLIC HEART FAILURE (ICD-428.32) - 2-D echo 1/08 EF 60% MICROALBUMINURIA (ICD-791.0) CAD (ICD-414.00) - normal cath 2/10 by Dr. Allyson Sabal ATHEROSCLEROSIS NOS (ICD-440.9) PERNICIOUS ANEMIA (ICD-281.0) DM W/NEURO MANIFESTATIONS, TYPE II (ICD-250.60) KIDNEY DISEASE, CHRONIC, STAGE III (ICD-585.3) CYSTITIS, CHRONIC (ICD-595.2) OSTEOARTHROSIS, GENERALIZED, MULTIPLE SITES (ICD-715.09) SWELLING, LIMB  (ICD-729.81) BACK PAIN (ICD-724.5) PEPTIC ULCER DIS., UNSPEC. W/O OBSTRUCTION (ICD-533.90) SENILE OSTEOPOROSIS (ICD-733.01) IRRITABLE BOWEL SYNDROME (ICD-564.1) HYPERTENSION, BENIGN SYSTEMIC (ICD-401.1)  L foot fx (Dr Margaretha Sheffield)  Past Surgical History: Reviewed history from 03/04/2010 and no changes required. 2D echo- LVEF 55-60% 1-08  abnml LV relaxation, mild AVR, RV mildly dilated  Appendectomy  carotid dopplers- no significant stenosis - 2007  EGD-- esophageal stricture, gastric ulcer  T&A moderate calcification of the right coronary and left anterior descending arteries.  On CT scan 03/2007  normal heart cath 01/2009 Dr Allyson Sabal. Cataract Extraction- Both eyes pacemaker placement Feb 2011  Social History: Reviewed history from 03/04/2010 and no changes required. Widowed x 21 yrs.  Retired from [employer].  Has 2 daughters- one in [place] and one in [place].  Lives w/ daughter [name] [phone]) in [place] and her son in law who had a stroke.  Nonsmoker.  Denies ETOH. Trying to walk more.  Has 2 living sibblings.  Review of Systems General:  Complains of fatigue, loss of appetite, and sleep disorder; denies fever and weight loss. ENT:  Denies sore throat. CV:  Complains of swelling of feet; denies chest pain or discomfort and shortness of breath with exertion. GI:  Complains of diarrhea and loss of appetite; denies indigestion, nausea, and vomiting. GU:  Complains of incontinence; vaginal irritation from urinary leakage. Neuro:  neuropathic pain in both feet.  Physical Exam  General:  alert, well-developed, well-nourished, and well-hydrated.  here with Sherri Head:  normocephalic and atraumatic.   Eyes:  sclera non icteric Mouth:  pharynx pink and moist and poor dentition.   Neck:  no masses.  no JVD Lungs:  Normal respiratory effort, chest expands symmetrically. Lungs are clear to auscultation, no crackles or wheezes. Heart:  RRR with 2/6 SEM Abdomen:  soft, non-tender, normal  bowel sounds, no distention, no masses, no guarding, no hepatomegaly, and no splenomegaly.   Extremities:  1+ non pitting LE edema with mild stasis dermatitits Skin:  color normal.   no pallor or diaphoresis Psych:  good eye contact, not anxious appearing, and not depressed appearing.     Impression & Recommendations:  Problem # 1:  ANOREXIA (ICD-783.0) Likely from UTI.  CXR was normal today.  Will update labs to r/o uremia , pancreatitis given her hx. Orders: T-Chest x-ray, 2 views (16109) T-CBC w/Diff ([id number]) T-Comprehensive Metabolic Panel [id number]) T-Amylase [phone]) T-Lipase ([id number]) UA Dipstick w/o Micro (automated)  (81003)  Problem # 2:  UTI (ICD-599.0) UA + for infection, f/u cx given hx of recurrent UTI.  Caution with abx use on coumadin. Start Levaquin,f/u cx and return for f/u MON. Her updated medication list for this problem includes:    Levaquin 250 Mg Tabs (Levofloxacin) .Marland Kitchen... 1 tab by mouth daily x 3 days  Orders: T-Urine Culture (Spectrum Order) [id number])  Encouraged to push clear liquids, get enough rest, and take acetaminophen as needed. To be seen in 10 days if no improvement, sooner if worse.  Problem # 3:  HYPERGLYCEMIA (ICD-790.29) Poor sugar readings despite compliance (for the most part) with insulin but not diet.  UA + for UTI which can cause hyperglycemia.  Will go ahed and increase her Lantus from 30--> 40 units/ day and r/o other causes for high readings.  REturn in 4 wks. Her updated medication list for this problem includes:    Lantus Solostar 100 Unit/ml Soln (Insulin glargine) .Marland KitchenMarland KitchenMarland KitchenMarland Kitchen 40  units [place] injection qhs    Novolog Flexpen Soln (Insulin aspart soln) .Marland KitchenMarland KitchenMarland KitchenMarland Kitchen 5 units subcutaneous with breakfast, 6 units subcutaneous with lunch, and  8 units subcutaneous with dinner  Orders: T-Comprehensive Metabolic Panel ([id number]) T-LDH ([id number])  Problem # 4:  DM W/NEURO MANIFESTATIONS, TYPE II (ICD-250.60) Neuropathic pain,  worse at night, interferring with sleep.  Will increase her nightly gapapentin to 900 mg, keeping 2 daytime doses at 600 mg.  Added Vicodin at night as needed.  I do think that if we get her sugars down, her neuropathy will improve. Her updated medication list for this problem includes:    Aspirin 81 Mg Tbec (Aspirin) .Marland Kitchen... Take 1 tablet by mouth once a day    Lantus Solostar 100 Unit/ml Soln (Insulin glargine) .Marland KitchenMarland KitchenMarland KitchenMarland Kitchen 40  units [place] injection qhs    Novolog Flexpen Soln (Insulin aspart soln) .Marland KitchenMarland KitchenMarland KitchenMarland Kitchen 5 units subcutaneous with breakfast, 6 units subcutaneous with lunch, and  8 units subcutaneous with dinner    Azor 10-40 Mg Tabs (Amlodipine-olmesartan) .Marland Kitchen... 1 tab by mouth daily  Complete Medication List: 1)  Aspirin 81 Mg Tbec (Aspirin) .... Take 1 tablet by mouth once a day 2)  Oystercal 500 + D 500-125 Mg-unit Tabs (Calcium carbonate-vitamin d) .... One by mouth twice daily with meals 3)  Lantus Solostar 100 Unit/ml Soln (Insulin glargine) .... 40  units [place] injection qhs 4)  Metoprolol Succinate 25 Mg Xr24h-tab (Metoprolol succinate) .Marland Kitchen.. 1 tab by mouth daily 5)  Novolog Flexpen Soln (Insulin aspart soln) .Marland KitchenMarland KitchenMarland Kitchen  5 units subcutaneous with breakfast, 6 units subcutaneous with lunch, and  8 units subcutaneous with dinner 6)  Lasix 40 Mg Tabs (Furosemide) .... 1/2  tab by mouth daily 7)  Pravastatin Sodium 40 Mg Tabs (Pravastatin sodium) .Marland Kitchen.. 1 tab by mouth qhs 8)  Ulticare Pen Needles 29g X 12mm Misc (Insulin pen needle) .... Use as directed 4 x a day 9)  Easy Comfort Insulin Syringe 30g X 5/16" 0.5 Ml Misc (Insulin syringe-needle u-100) .... Use as directed 10)  Restoril 15 Mg Caps (Temazepam) .Marland Kitchen.. 1 tab by mouth at bedtime as needed sleep 11)  Azor 10-40 Mg Tabs (Amlodipine-olmesartan) .Marland Kitchen.. 1 tab by mouth daily 12)  Pantoprazole Sodium 40 Mg Tbec (Pantoprazole sodium) .... Take 1 tab by mouth once daily 13)  Clonidine Hcl 0.1 Mg Tabs (Clonidine hcl) .Marland Kitchen.. 1 tab by mouth q 12 hrs 14)  Gabapentin 600 Mg  Tabs (Gabapentin) .Marland Kitchen.. 1 tab by mouth three times a day 15)  Nitro-dur 0.4 Mg/hr Pt24 (Nitroglycerin) .Marland Kitchen.. 1 tab by mouth q 5 min x 3 as needed chest pain 16)  Levothroid 75 Mcg Tabs (Levothyroxine sodium) .Marland Kitchen.. 1 tab by mouth daily 17)  Coumadin 5 Mg Tabs (Warfarin sodium) .... Take 1 tab by mouth once daily as directed 18)  Hydrocodone-acetaminophen 5-325 Mg Tabs (Hydrocodone-acetaminophen) .Marland Kitchen.. 1 tab by mouth q 6 hrs as needed for pain; take with food 19)  Gabapentin 300 Mg Caps (Gabapentin) .Marland Kitchen.. 1 capsule by mouth qhs 20)  Levaquin 250 Mg Tabs (Levofloxacin) .Marland Kitchen.. 1 tab by mouth daily x 3 days 21)  Nystatin-triamcinolone 100000-0.1 Unit/gm-% Oint (Nystatin-triamcinolone) .... Apply to perineum two times a day for vaginal irritation  Other Orders: T-TSH ([id number])  Patient Instructions: 1)  Urine / CXR / labs today. 2)  Increase Lantus to 40 units once daily. 3)  Treat UTI with 3 days of Levaquin. 4)  Return for recheck on Monday. Prescriptions: NYSTATIN-TRIAMCINOLONE 100000-0.1 UNIT/GM-% OINT (NYSTATIN-TRIAMCINOLONE) apply to perineum two times a day for vaginal irritation  #1 tube x 0   Entered and Authorized by:   [place] Bars DO   Signed by:   Se[place]our Bars DO on 03/10/2011   Method used:   Electronically to        [facility] [phone]* (retail)       [address] [place], [place]  [address]       Ph: [phone]       Fax: [phone]   RxID:   [id number] LEVAQUIN 250 MG TABS (LEVOFLOXACIN) 1 tab by mouth daily x 3 days  #3 tabs x 0   Entered and Authorized by:   Se[place] Bars DO   Signed by:   Se[place] Bars DO on 03/10/2011   Method used:   Electronically to        [facility] [phone]* (retail)       [address] [place], [place]  [address]       Ph: [phone]       Fax: [phone]   RxID:   [id number] HYDROCODONE-ACETAMINOPHEN 5-325 MG TABS (HYDROCODONE-ACETAMINOPHEN) 1 tab by mouth q 6 hrs as needed for pain; take with food  #60 x 0   Entered  and Authorized by:   [place] Bars DO   Signed by:   Se[place] Bars DO on 03/10/2011   Method used:   Printed then faxed to .Marland KitchenMarland Kitchen  [facility] [address] [phone]* (retail)       [address] [place], [place]  [address]       Ph: [phone]       Fax: [phone]   RxID:   [id number] GABAPENTIN 300 MG CAPS (GABAPENTIN) 1 capsule by mouth qhs  #30 x 3   Entered and Authorized by:   Se[place] Bars DO   Signed by:   [place] Bars DO on 03/10/2011   Method used:   Electronically to        [facility] [phone]* (retail)       [address] [place], [place]  [address]       Ph: [phone]       Fax: [phone]   RxID:   [id number]    Orders Added: 1)  T-Chest x-ray, 2 views [71020] 2)  T-CBC w/Diff [[id number]] 3)  T-Comprehensive Metabolic Panel [[id number]] 4)  T-LDH [25427-06237] 5)  T-TSH [[id number]] 6)  T-Amylase [[id number]] 7)  T-Lipase [[id number]] 8)  UA Dipstick w/o Micro (automated)  [81003] 9)  T-Urine Culture (Spectrum Order) [[id number]] 10)  Est. Patient Level IV [93716]    Laboratory Results   Urine Tests    Routine Urinalysis   Color: yellow Appearance: Hazy Glucose: negative   (Normal Range: Negative) Bilirubin: negative   (Normal Range: Negative) Ketone: trace (5)   (Normal Range: Negative) Spec. Gravity: >=1.030   (Normal Range: 1.003-1.035) Blood: small   (Normal Range: Negative) pH: 5.5   (Normal Range: 5.0-8.0) Protein: 30   (Normal Range: Negative) Urobilinogen: 0.2   (Normal Range: 0-1) Nitrite: positive   (Normal Range: Negative) Leukocyte Esterace: moderate   (Normal Range: Negative)

## 2011-03-17 LAB — GLUCOSE, CAPILLARY
Glucose-Capillary: 113 mg/dL — ABNORMAL HIGH (ref 70–99)
Glucose-Capillary: 128 mg/dL — ABNORMAL HIGH (ref 70–99)
Glucose-Capillary: 191 mg/dL — ABNORMAL HIGH (ref 70–99)
Glucose-Capillary: 213 mg/dL — ABNORMAL HIGH (ref 70–99)
Glucose-Capillary: 76 mg/dL (ref 70–99)
Glucose-Capillary: 86 mg/dL (ref 70–99)
Glucose-Capillary: 91 mg/dL (ref 70–99)

## 2011-03-17 LAB — DIFFERENTIAL
Basophils Absolute: 0 10*3/uL (ref 0.0–0.1)
Basophils Relative: 1 % (ref 0–1)
Eosinophils Absolute: 0.1 10*3/uL (ref 0.0–0.7)
Neutrophils Relative %: 75 % (ref 43–77)

## 2011-03-17 LAB — URINE CULTURE: Culture: NO GROWTH

## 2011-03-17 LAB — CK TOTAL AND CKMB (NOT AT ARMC)
CK, MB: 2.3 ng/mL (ref 0.3–4.0)
Relative Index: INVALID (ref 0.0–2.5)
Total CK: 76 U/L (ref 7–177)

## 2011-03-17 LAB — COMPREHENSIVE METABOLIC PANEL
ALT: 44 U/L — ABNORMAL HIGH (ref 0–35)
Albumin: 3.5 g/dL (ref 3.5–5.2)
Alkaline Phosphatase: 65 U/L (ref 39–117)
BUN: 12 mg/dL (ref 6–23)
CO2: 20 mEq/L (ref 19–32)
Calcium: 8.9 mg/dL (ref 8.4–10.5)
Calcium: 9 mg/dL (ref 8.4–10.5)
Creatinine, Ser: 1.3 mg/dL — ABNORMAL HIGH (ref 0.4–1.2)
GFR calc non Af Amer: 22 mL/min — ABNORMAL LOW (ref >60)
Glucose, Bld: 240 mg/dL — ABNORMAL HIGH (ref 70–99)
Potassium: 4.5 mEq/L (ref 3.5–5.1)
Sodium: 137 mEq/L (ref 135–145)
Total Bilirubin: 0.8 mg/dL (ref 0.3–1.2)
Total Protein: 6.2 g/dL (ref 6.0–8.3)

## 2011-03-17 LAB — URINALYSIS, ROUTINE W REFLEX MICROSCOPIC
Leukocytes, UA: NEGATIVE
Protein, ur: NEGATIVE mg/dL
Specific Gravity, Urine: 1.014 (ref 1.005–1.030)
Urobilinogen, UA: 0.2 mg/dL (ref 0.0–1.0)

## 2011-03-17 LAB — CARDIAC PANEL(CRET KIN+CKTOT+MB+TROPI)
CK, MB: 1.6 ng/mL (ref 0.3–4.0)
CK, MB: 1.9 ng/mL (ref 0.3–4.0)
Troponin I: 0.03 ng/mL (ref 0.00–0.06)
Troponin I: 0.05 ng/mL (ref 0.00–0.06)

## 2011-03-17 LAB — IRON AND TIBC
Iron: 21 ug/dL — ABNORMAL LOW (ref 42–135)
Saturation Ratios: 10 % — ABNORMAL LOW (ref 20–55)
TIBC: 201 ug/dL — ABNORMAL LOW (ref 250–470)
UIBC: 180 ug/dL

## 2011-03-17 LAB — T4, FREE: Free T4: 1.38 ng/dL (ref 0.80–1.80)

## 2011-03-17 LAB — PROTIME-INR
INR: 1.18 (ref 0.00–1.49)
INR: 1.44 (ref 0.00–1.49)
INR: 2.44 — ABNORMAL HIGH (ref 0.00–1.49)
INR: 3.22 — ABNORMAL HIGH (ref 0.00–1.49)
Prothrombin Time: 17.4 seconds — ABNORMAL HIGH (ref 11.6–15.2)
Prothrombin Time: 26.3 seconds — ABNORMAL HIGH (ref 11.6–15.2)
Prothrombin Time: 32.7 seconds — ABNORMAL HIGH (ref 11.6–15.2)

## 2011-03-17 LAB — CBC
HCT: 32.4 % — ABNORMAL LOW (ref 36.0–46.0)
Hemoglobin: 11.7 g/dL — ABNORMAL LOW (ref 12.0–15.0)
Hemoglobin: 9.7 g/dL — ABNORMAL LOW (ref 12.0–15.0)
MCHC: 34.2 g/dL (ref 30.0–36.0)
MCHC: 34.5 g/dL (ref 30.0–36.0)
MCHC: 34.5 g/dL (ref 30.0–36.0)
MCV: 98.8 fL (ref 78.0–100.0)
MCV: 99.3 fL (ref 78.0–100.0)
Platelets: 206 10*3/uL (ref 150–400)
Platelets: 235 10*3/uL (ref 150–400)
RBC: 2.82 MIL/uL — ABNORMAL LOW (ref 3.87–5.11)
RBC: 2.86 MIL/uL — ABNORMAL LOW (ref 3.87–5.11)
RBC: 3.38 MIL/uL — ABNORMAL LOW (ref 3.87–5.11)
RDW: 13.5 % (ref 11.5–15.5)
WBC: 5.5 10*3/uL (ref 4.0–10.5)
WBC: 6.4 10*3/uL (ref 4.0–10.5)

## 2011-03-17 LAB — GLUCOSE, RANDOM: Glucose, Bld: 251 mg/dL — ABNORMAL HIGH (ref 70–99)

## 2011-03-17 LAB — BASIC METABOLIC PANEL
BUN: 20 mg/dL (ref 6–23)
BUN: 29 mg/dL — ABNORMAL HIGH (ref 6–23)
CO2: 23 mEq/L (ref 19–32)
CO2: 25 mEq/L (ref 19–32)
CO2: 26 mEq/L (ref 19–32)
Calcium: 8.3 mg/dL — ABNORMAL LOW (ref 8.4–10.5)
Chloride: 109 mEq/L (ref 96–112)
Chloride: 114 mEq/L — ABNORMAL HIGH (ref 96–112)
Creatinine, Ser: 1.37 mg/dL — ABNORMAL HIGH (ref 0.4–1.2)
Creatinine, Ser: 1.45 mg/dL — ABNORMAL HIGH (ref 0.4–1.2)
Creatinine, Ser: 1.58 mg/dL — ABNORMAL HIGH (ref 0.4–1.2)
Creatinine, Ser: 1.88 mg/dL — ABNORMAL HIGH (ref 0.4–1.2)
GFR calc Af Amer: 38 mL/min — ABNORMAL LOW (ref >60)
GFR calc Af Amer: 42 mL/min — ABNORMAL LOW (ref >60)
GFR calc Af Amer: 45 mL/min — ABNORMAL LOW (ref >60)
GFR calc non Af Amer: 35 mL/min — ABNORMAL LOW (ref >60)
Glucose, Bld: 234 mg/dL — ABNORMAL HIGH (ref 70–99)
Potassium: 4.7 mEq/L (ref 3.5–5.1)
Potassium: 5.5 mEq/L — ABNORMAL HIGH (ref 3.5–5.1)
Sodium: 143 mEq/L (ref 135–145)

## 2011-03-17 LAB — POCT CARDIAC MARKERS
CKMB, poc: <1 ng/mL — ABNORMAL LOW (ref 1.0–8.0)
Myoglobin, poc: 66 ng/mL (ref 12–200)

## 2011-03-17 LAB — URINE MICROSCOPIC-ADD ON

## 2011-03-17 LAB — BRAIN NATRIURETIC PEPTIDE: Pro B Natriuretic peptide (BNP): 769 pg/mL — ABNORMAL HIGH (ref 0.0–100.0)

## 2011-03-17 LAB — RETICULOCYTES: RBC.: 2.89 MIL/uL — ABNORMAL LOW (ref 3.87–5.11)

## 2011-03-17 LAB — TSH: TSH: 4.588 u[IU]/mL — ABNORMAL HIGH (ref 0.350–4.500)

## 2011-03-17 LAB — VITAMIN B12: Vitamin B-12: 597 pg/mL (ref 211–911)

## 2011-03-18 LAB — URINE CULTURE: Colony Count: 30000

## 2011-03-24 ENCOUNTER — Ambulatory Visit (INDEPENDENT_AMBULATORY_CARE_PROVIDER_SITE_OTHER): Payer: MEDICARE | Admitting: Family Medicine

## 2011-03-24 ENCOUNTER — Encounter: Payer: Self-pay | Admitting: Family Medicine

## 2011-03-24 DIAGNOSIS — E1149 Type 2 diabetes mellitus with other diabetic neurological complication: Secondary | ICD-10-CM

## 2011-03-24 DIAGNOSIS — R5383 Other fatigue: Secondary | ICD-10-CM | POA: Insufficient documentation

## 2011-03-24 MED ORDER — INSULIN GLARGINE 100 UNIT/ML ~~LOC~~ SOLN: 18.0000 [IU] | Freq: Two times a day (BID) | SUBCUTANEOUS | Status: DC

## 2011-03-24 NOTE — Assessment & Plan Note (Signed)
Chronic.  I explained to [name] that I think her fatigue is multi-factoral.  She is on many meds and has had years of poor cardiovascular conditioning.  On top of that, she had a pacemaker placed last year and had new onset A. Fib.  Though she had a 'clean cath' in 2010, she still has fatigue.  Will recheck her TSH (on higher dose levothyroxine now) and her B12 level (hx of pernicious anemia) in 1 month.

## 2011-03-24 NOTE — Assessment & Plan Note (Signed)
Cr bumped to 1.65 on 3-15 with UTI, treated.  She is working on fluid rehydration, BP has improved and she can go back to a full tab on her AZOR.  Recheck BMP with labs in 6 wks.

## 2011-03-24 NOTE — Progress Notes (Signed)
  Subjective:    Patient ID: [name], female    DOB: 11-04-1928, 77 y.o.   MRN: [id number]  HPI  77 yo WF presents for f/u fatigue and recent UTI.  Her f/u urine culture was negative and she is no longer having symptoms other than fatigue which has been chronic.  She has been injecting Lantus 30 units at night.  Her AM fasting today was 65 and her 2 hr PPs at night are running in the 200s.  She has a long hx of deconditioning and dietary non compliance.  She saw Dr Allyson Sabal last year and it was noted that she had a normal cardiac cath 01-2010.  She now has a pacemaker and her A fib is controlled on rate limiting meds and coumadin.  Her Cr was up to 1.65 but her baseline is 1.4.  She has started the slightly higher dose of levothyroxine.   Review of Systems  Constitutional: Positive for fatigue. Negative for fever.  Respiratory: Negative for shortness of breath.   Cardiovascular: Positive for leg swelling. Negative for chest pain and palpitations.  Musculoskeletal: Positive for back pain.  Neurological: Negative for weakness.       Objective:   Physical Exam  Constitutional: She appears well-developed and well-nourished.       Obese.  Here with daughter  Eyes: No scleral icterus.  Neck: Normal range of motion. No JVD present.  Cardiovascular: Normal rate, regular rhythm and normal heart sounds.   No murmur heard. Pulmonary/Chest: Effort normal and breath sounds normal. She has no wheezes.  Abdominal: Soft. Bowel sounds are normal. There is no tenderness.  Musculoskeletal: She exhibits edema (2+ nonpitting LE edema).  Skin: Skin is warm and dry.  Psychiatric: She has a normal mood and affect.          Assessment & Plan:

## 2011-03-24 NOTE — Assessment & Plan Note (Signed)
Hx of poor compliance with meds and diet.  Now having low AMs and high PPs.  Will change her Lantus from 30 units once a day to 18 units bid.  Call if any problems.  Gabapentin  And Vicodin has helped her neuropathic pain in her feet at night.  F/u in 6 wks.

## 2011-03-24 NOTE — Patient Instructions (Signed)
Increase Azor back to original dose.  Change Lantus to 18 units in the morning and 18 units at bedtime.  Return after May 15th for f/u diabetes/ labs.

## 2011-03-25 NOTE — Assessment & Plan Note (Signed)
Summary: F/U UTI   Vital Signs:  Patient profile:   77 year old female Height:      68 inches Weight:      193 pounds BMI:     29.45 O2 Sat:      96 % on Room air Temp:     98.2 degrees F oral Pulse rate:   84 / minute BP sitting:   99 / 60  (left arm) Cuff size:   regular  Vitals Entered By: Payton Spark CMA (March 15, 2011 1:15 PM)  O2 Flow:  Room air CC: F/U UTI.    Primary Care Provider:  Seymour Bars DO  CC:  F/U UTI. Marland Kitchen  History of Present Illness: 77 year old female presents for f/u UTI.  She completed 3 days of Levaquin 2 days ago and her UA is much improved today.  Her appetite and lack of energy are unchanged.  She has minmal burning and urgency to urinate have improved slightly.  She denies fevers or abdominal pain.  Her fasting sugar this morning was 176 and she states it has been running high. We increase her Lantus from 30 to 40 units once daily.   increase in gabapentin dose has helped her neuropathy and the vicodin has helped her neuropathy pain. She does feel cold often but denies chest pains, increased LE edema or shortness of breath.  Her Cr was up to 1.65 on labs last wk, which is a rise for her.  Denies feeling lightheaded.    Current Medications (verified): 1)  Aspirin 81 Mg Tbec (Aspirin) .... Take 1 Tablet By Mouth Once A Day 2)  Oystercal 500 + D 500-125 Mg-Unit Tabs (Calcium Carbonate-Vitamin D) .... One By Mouth Twice Daily With Meals 3)  Lantus Solostar 100 Unit/ml Soln (Insulin Glargine) .... 40  Units [place] Injection Qhs 4)  Metoprolol Succinate 25 Mg Xr24h-Tab (Metoprolol Succinate) .Marland Kitchen.. 1 Tab By Mouth Daily 5)  Novolog Flexpen  Soln (Insulin Aspart Soln) .... 5 Units Subcutaneous With Breakfast, 6 Units Subcutaneous With Lunch, and  8 Units Subcutaneous With Dinner 6)  Lasix 40 Mg Tabs (Furosemide) .... 1/2  Tab By Mouth Daily 7)  Pravastatin Sodium 40 Mg Tabs (Pravastatin Sodium) .Marland Kitchen.. 1 Tab By Mouth Qhs 8)  Ulticare Pen Needles 29g X 12mm  Misc  (Insulin Pen Needle) .... Use As Directed 4 X A Day 9)  Easy Comfort Insulin Syringe 30g X 5/16" 0.5 Ml  Misc (Insulin Syringe-Needle U-100) .... Use As Directed 10)  Restoril 15 Mg Caps (Temazepam) .Marland Kitchen.. 1 Tab By Mouth At Bedtime As Needed Sleep 11)  Azor 10-40 Mg Tabs (Amlodipine-Olmesartan) .Marland Kitchen.. 1 Tab By Mouth Daily 12)  Pantoprazole Sodium 40 Mg Tbec (Pantoprazole Sodium) .... Take 1 Tab By Mouth Once Daily 13)  Clonidine Hcl 0.1 Mg Tabs (Clonidine Hcl) .Marland Kitchen.. 1 Tab By Mouth Q 12 Hrs 14)  Gabapentin 600 Mg Tabs (Gabapentin) .Marland Kitchen.. 1 Tab By Mouth Three Times A Day 15)  Nitro-Dur 0.4 Mg/hr Pt24 (Nitroglycerin) .Marland Kitchen.. 1 Tab By Mouth Q 5 Min X 3 As Needed Chest Pain 16)  Levothroid 100 Mcg Tabs (Levothyroxine Sodium) .Marland Kitchen.. 1 Tab By Mouth Daily 17)  Coumadin 5 Mg Tabs (Warfarin Sodium) .... Take 1 Tab By Mouth Once Daily As Directed 18)  Hydrocodone-Acetaminophen 5-325 Mg Tabs (Hydrocodone-Acetaminophen) .Marland Kitchen.. 1 Tab By Mouth Q 6 Hrs As Needed For Pain; Take With Food 19)  Gabapentin 300 Mg Caps (Gabapentin) .Marland Kitchen.. 1 Capsule By Mouth Qhs 20)  Nystatin-Triamcinolone  100000-0.1 Unit/gm-% Oint (Nystatin-Triamcinolone) .... Apply To Perineum Two Times A Day For Vaginal Irritation  Allergies (verified): 1)  ! Ace Inhibitors 2)  ! Monopril 3)  ! Bactrim  Past History:  Past Medical History: Reviewed history from 01/23/2010 and no changes required. hx of kidney stones Current Problems:  COLONIC POLYPS, HYPERPLASTIC, HX OF (ICD-V12.72) A. FIB (Dr Allyson Sabal) 12-2009 GASTRIC ULCER, HX OF (ICD-V12.71) Hx of ESOPHAGEAL STRICTURE (ICD-530.3) HIATAL HERNIA (ICD-553.3) Family Hx of COLON CANCER (ICD-153.9) DIARRHEA (ICD-787.91) MEMORY LOSS (ICD-780.93) OTHER CATARACT (ICD-366.8) CHRONIC DIASTOLIC HEART FAILURE (ICD-428.32) - 2-D echo 1/08 EF 60% MICROALBUMINURIA (ICD-791.0) CAD (ICD-414.00) - normal cath 2/10 by Dr. Allyson Sabal ATHEROSCLEROSIS NOS (ICD-440.9) PERNICIOUS ANEMIA (ICD-281.0) DM W/NEURO MANIFESTATIONS,  TYPE II (ICD-250.60) KIDNEY DISEASE, CHRONIC, STAGE III (ICD-585.3) CYSTITIS, CHRONIC (ICD-595.2) OSTEOARTHROSIS, GENERALIZED, MULTIPLE SITES (ICD-715.09) SWELLING, LIMB (ICD-729.81) BACK PAIN (ICD-724.5) PEPTIC ULCER DIS., UNSPEC. W/O OBSTRUCTION (ICD-533.90) SENILE OSTEOPOROSIS (ICD-733.01) IRRITABLE BOWEL SYNDROME (ICD-564.1) HYPERTENSION, BENIGN SYSTEMIC (ICD-401.1)  L foot fx (Dr Margaretha Sheffield)  Past Surgical History: Reviewed history from 03/04/2010 and no changes required. 2D echo- LVEF 55-60% 1-08  abnml LV relaxation, mild AVR, RV mildly dilated  Appendectomy  carotid dopplers- no significant stenosis - 2007  EGD-- esophageal stricture, gastric ulcer  T&A moderate calcification of the right coronary and left anterior descending arteries.  On CT scan 03/2007  normal heart cath 01/2009 Dr Allyson Sabal. Cataract Extraction- Both eyes pacemaker placement Feb 2011  Social History: Reviewed history from 03/04/2010 and no changes required. Widowed x 21 yrs.  Retired from [employer].  Has 2 daughters- one in [place] [employer] [employer] and one in [place].  Lives w/ daughter [name] [phone]) in [place] and her son in law who had a stroke.  Nonsmoker.  Denies ETOH. Trying to walk more.  Has 2 living sibblings.  Review of Systems General:  Complains of fatigue and loss of appetite; denies chills, fever, weakness, and weight loss. CV:  Complains of swelling of feet; denies chest pain or discomfort, difficulty breathing at night, fainting, lightheadness, palpitations, and shortness of breath with exertion. Resp:  Denies cough and shortness of breath. GI:  Denies abdominal pain. GU:  Complains of dysuria and incontinence. Neuro:  Complains of numbness and poor balance.  Physical Exam  General:  alert and well-developed.   Head:  normocephalic and atraumatic.   Eyes:  sclera non icteric Mouth:  pharynx pink and moist.   Neck:  no masses.   Lungs:  normal respiratory effort, normal breath sounds, no crackles, and no  wheezes.   Heart:  normal rate and regular rhythm.   Abdomen:  Diffuse tenderness to deep palpation in lower quadrants.  Normal bowel sounds, nondistended. Pulses:  R radial normal and L radial normal.   Extremities:  2+ nonpitting LE edema bilat Skin:  color normal.   Psych:  good eye contact and flat affect.     Impression & Recommendations:  Problem # 1:  UTI (ICD-599.0) UA normal today.  Finished course of Levaquin.  Will hold AZOR for next three days and then restart 1/2 dose until follow up next week to recheck kidney function.  will reculture today.    The following medications were removed from the medication list:    Levaquin 250 Mg Tabs (Levofloxacin) .Marland Kitchen... 1 tab by mouth daily x 3 days  Orders: UA Dipstick w/o Micro (automated)  (81003) T-Culture, Urine ([id number])  Problem # 2:  FATIGUE, ACUTE (ICD-780.79) Patient still feeling fatigued despite antibiotic course and normal UA.  Consider hypothyroidism( recently adjusted  her dose up)  vs. acute on chronic renal insufficiency vs. polypharmacy.  Recently changed thyroid medication as she was hypothyroid four days ago so questionable as to whether this has had time to have an effect.  Will recheck kidney function next week.  Complete Medication List: 1)  Aspirin 81 Mg Tbec (Aspirin) .... Take 1 tablet by mouth once a day 2)  Oystercal 500 + D 500-125 Mg-unit Tabs (Calcium carbonate-vitamin d) .... One by mouth twice daily with meals 3)  Lantus Solostar 100 Unit/ml Soln (Insulin glargine) .... 40  units [place] injection qhs 4)  Metoprolol Succinate 25 Mg Xr24h-tab (Metoprolol succinate) .Marland Kitchen.. 1 tab by mouth daily 5)  Novolog Flexpen Soln (Insulin aspart soln) .... 5 units subcutaneous with breakfast, 6 units subcutaneous with lunch, and  8 units subcutaneous with dinner 6)  Lasix 40 Mg Tabs (Furosemide) .... 1/2  tab by mouth daily 7)  Pravastatin Sodium 40 Mg Tabs (Pravastatin sodium) .Marland Kitchen.. 1 tab by mouth qhs 8)  Ulticare Pen  Needles 29g X 12mm Misc (Insulin pen needle) .... Use as directed 4 x a day 9)  Easy Comfort Insulin Syringe 30g X 5/16" 0.5 Ml Misc (Insulin syringe-needle u-100) .... Use as directed 10)  Restoril 15 Mg Caps (Temazepam) .Marland Kitchen.. 1 tab by mouth at bedtime as needed sleep 11)  Azor 10-40 Mg Tabs (Amlodipine-olmesartan) .Marland Kitchen.. 1 tab by mouth daily 12)  Pantoprazole Sodium 40 Mg Tbec (Pantoprazole sodium) .... Take 1 tab by mouth once daily 13)  Clonidine Hcl 0.1 Mg Tabs (Clonidine hcl) .Marland Kitchen.. 1 tab by mouth q 12 hrs 14)  Gabapentin 600 Mg Tabs (Gabapentin) .Marland Kitchen.. 1 tab by mouth three times a day 15)  Nitro-dur 0.4 Mg/hr Pt24 (Nitroglycerin) .Marland Kitchen.. 1 tab by mouth q 5 min x 3 as needed chest pain 16)  Levothroid 100 Mcg Tabs (Levothyroxine sodium) .Marland Kitchen.. 1 tab by mouth daily 17)  Coumadin 5 Mg Tabs (Warfarin sodium) .... Take 1 tab by mouth once daily as directed 18)  Hydrocodone-acetaminophen 5-325 Mg Tabs (Hydrocodone-acetaminophen) .Marland Kitchen.. 1 tab by mouth q 6 hrs as needed for pain; take with food 19)  Gabapentin 300 Mg Caps (Gabapentin) .Marland Kitchen.. 1 capsule by mouth qhs 20)  Nystatin-triamcinolone 100000-0.1 Unit/gm-% Oint (Nystatin-triamcinolone) .... Apply to perineum two times a day for vaginal irritation  Patient Instructions: 1)  UA normal today.   2)  HOLD AZOR FOR THE NEXT 3 DAYS THEN RESTART AT 1/2 TAB ONCE DAILY UNTIL LAB RECHECK ON MONDAY. 3)  CONTINUE CURRENT MEDS.   4)  RETURN MONDAY FOR F/U WITH LABS.   Orders Added: 1)  UA Dipstick w/o Micro (automated)  [81003] 2)  T-Culture, Urine [[id number]] 3)  Est. Patient Level III [14782]    Laboratory Results   Urine Tests    Routine Urinalysis   Color: yellow Appearance: Clear Glucose: negative   (Normal Range: Negative) Bilirubin: negative   (Normal Range: Negative) Ketone: negative   (Normal Range: Negative) Spec. Gravity: 1.020   (Normal Range: 1.003-1.035) Blood: negative   (Normal Range: Negative) pH: 5.5   (Normal Range:  5.0-8.0) Protein: negative   (Normal Range: Negative) Urobilinogen: 0.2   (Normal Range: 0-1) Nitrite: negative   (Normal Range: Negative) Leukocyte Esterace: negative   (Normal Range: Negative)

## 2011-03-29 LAB — BASIC METABOLIC PANEL
BUN: 21 mg/dL (ref 6–23)
BUN: 25 mg/dL — ABNORMAL HIGH (ref 6–23)
CO2: 25 mEq/L (ref 19–32)
CO2: 29 mEq/L (ref 19–32)
Calcium: 9.1 mg/dL (ref 8.4–10.5)
Calcium: 9.9 mg/dL (ref 8.4–10.5)
Creatinine, Ser: 1.44 mg/dL — ABNORMAL HIGH (ref 0.4–1.2)
GFR calc Af Amer: 42 mL/min — ABNORMAL LOW (ref >60)
GFR calc non Af Amer: 46 mL/min — ABNORMAL LOW (ref >60)
Glucose, Bld: 177 mg/dL — ABNORMAL HIGH (ref 70–99)
Sodium: 139 mEq/L (ref 135–145)

## 2011-03-29 LAB — URINALYSIS, ROUTINE W REFLEX MICROSCOPIC
Bilirubin Urine: NEGATIVE
Hgb urine dipstick: NEGATIVE
Ketones, ur: NEGATIVE mg/dL
Nitrite: POSITIVE — AB
Protein, ur: NEGATIVE mg/dL
Specific Gravity, Urine: 1.015 (ref 1.005–1.030)
Urobilinogen, UA: 0.2 mg/dL (ref 0.0–1.0)

## 2011-03-29 LAB — DIFFERENTIAL
Basophils Absolute: 0.1 10*3/uL (ref 0.0–0.1)
Lymphocytes Relative: 19 % (ref 12–46)
Monocytes Absolute: 0.8 10*3/uL (ref 0.1–1.0)
Neutro Abs: 7.5 10*3/uL (ref 1.7–7.7)
Neutrophils Relative %: 71 % (ref 43–77)

## 2011-03-29 LAB — CBC
HCT: 39 % (ref 36.0–46.0)
Hemoglobin: 14.3 g/dL (ref 12.0–15.0)
MCHC: 34.6 g/dL (ref 30.0–36.0)
MCV: 98.3 fL (ref 78.0–100.0)
Platelets: 251 10*3/uL (ref 150–400)
RBC: 4.25 MIL/uL (ref 3.87–5.11)
RDW: 13.6 % (ref 11.5–15.5)

## 2011-03-29 LAB — URINE CULTURE: Colony Count: >=100000

## 2011-03-29 LAB — URINE MICROSCOPIC-ADD ON

## 2011-03-29 LAB — LIPID PANEL
HDL: 36 mg/dL — ABNORMAL LOW (ref >39)
LDL Cholesterol: 61 mg/dL (ref 0–99)
Triglycerides: 174 mg/dL — ABNORMAL HIGH (ref <150)
VLDL: 35 mg/dL (ref 0–40)

## 2011-03-29 LAB — CARDIAC PANEL(CRET KIN+CKTOT+MB+TROPI)
CK, MB: 4 ng/mL (ref 0.3–4.0)
Relative Index: 2.8 — ABNORMAL HIGH (ref 0.0–2.5)
Relative Index: 3.1 — ABNORMAL HIGH (ref 0.0–2.5)
Total CK: 114 U/L (ref 7–177)
Troponin I: 0.02 ng/mL (ref 0.00–0.06)

## 2011-03-29 LAB — GLUCOSE, CAPILLARY: Glucose-Capillary: 173 mg/dL — ABNORMAL HIGH (ref 70–99)

## 2011-03-29 LAB — TROPONIN I: Troponin I: 0.02 ng/mL (ref 0.00–0.06)

## 2011-03-29 LAB — CK TOTAL AND CKMB (NOT AT ARMC)
CK, MB: 2 ng/mL (ref 0.3–4.0)
Total CK: 72 U/L (ref 7–177)

## 2011-03-29 LAB — PROTIME-INR: Prothrombin Time: 13.4 seconds (ref 11.6–15.2)

## 2011-03-29 LAB — HEMOGLOBIN A1C: Hgb A1c MFr Bld: 7.3 % — ABNORMAL HIGH (ref 4.6–6.1)

## 2011-04-05 LAB — LIPID PANEL
Cholesterol: 122 mg/dL (ref 0–200)
HDL: 31 mg/dL — ABNORMAL LOW (ref >39)
Triglycerides: 131 mg/dL (ref <150)

## 2011-04-05 LAB — BASIC METABOLIC PANEL
BUN: 26 mg/dL — ABNORMAL HIGH (ref 6–23)
Calcium: 9.3 mg/dL (ref 8.4–10.5)
Chloride: 107 mEq/L (ref 96–112)
Creatinine, Ser: 1.81 mg/dL — ABNORMAL HIGH (ref 0.4–1.2)
GFR calc Af Amer: 33 mL/min — ABNORMAL LOW (ref >60)
GFR calc Af Amer: 38 mL/min — ABNORMAL LOW (ref >60)
GFR calc non Af Amer: 27 mL/min — ABNORMAL LOW (ref >60)
Potassium: 4.6 mEq/L (ref 3.5–5.1)
Sodium: 137 mEq/L (ref 135–145)

## 2011-04-05 LAB — POCT CARDIAC MARKERS

## 2011-04-05 LAB — URINALYSIS, ROUTINE W REFLEX MICROSCOPIC
Bilirubin Urine: NEGATIVE
Hgb urine dipstick: NEGATIVE
Protein, ur: NEGATIVE mg/dL
Urobilinogen, UA: 0.2 mg/dL (ref 0.0–1.0)

## 2011-04-05 LAB — DIFFERENTIAL
Basophils Relative: 1 % (ref 0–1)
Eosinophils Absolute: 0.2 10*3/uL (ref 0.0–0.7)
Monocytes Relative: 7 % (ref 3–12)
Neutrophils Relative %: 76 % (ref 43–77)

## 2011-04-05 LAB — CBC
MCHC: 33.5 g/dL (ref 30.0–36.0)
MCV: 96.5 fL (ref 78.0–100.0)
RBC: 3.8 MIL/uL — ABNORMAL LOW (ref 3.87–5.11)

## 2011-04-05 LAB — CARDIAC PANEL(CRET KIN+CKTOT+MB+TROPI)
CK, MB: 1.3 ng/mL (ref 0.3–4.0)
CK, MB: 1.8 ng/mL (ref 0.3–4.0)
Relative Index: INVALID (ref 0.0–2.5)
Total CK: 82 U/L (ref 7–177)
Troponin I: 0.01 ng/mL (ref 0.00–0.06)

## 2011-04-05 LAB — GLUCOSE, CAPILLARY
Glucose-Capillary: 177 mg/dL — ABNORMAL HIGH (ref 70–99)
Glucose-Capillary: 205 mg/dL — ABNORMAL HIGH (ref 70–99)

## 2011-04-05 LAB — TSH: TSH: 13.089 u[IU]/mL — ABNORMAL HIGH (ref 0.350–4.500)

## 2011-04-05 LAB — URINE CULTURE: Colony Count: >=100000

## 2011-04-05 LAB — CK TOTAL AND CKMB (NOT AT ARMC)
Relative Index: INVALID (ref 0.0–2.5)
Total CK: 74 U/L (ref 7–177)

## 2011-04-05 LAB — URINE MICROSCOPIC-ADD ON

## 2011-04-05 LAB — BRAIN NATRIURETIC PEPTIDE: Pro B Natriuretic peptide (BNP): 109 pg/mL — ABNORMAL HIGH (ref 0.0–100.0)

## 2011-04-05 LAB — D-DIMER, QUANTITATIVE: D-Dimer, Quant: 0.37 ug/mL-FEU (ref 0.00–0.48)

## 2011-04-20 ENCOUNTER — Encounter: Payer: Self-pay | Admitting: Family Medicine

## 2011-04-20 ENCOUNTER — Ambulatory Visit (INDEPENDENT_AMBULATORY_CARE_PROVIDER_SITE_OTHER): Payer: MEDICARE | Admitting: Family Medicine

## 2011-04-20 VITALS — BP 144/74 | HR 75 | Temp 98.3°F | Ht 68.0 in | Wt 189.0 lb

## 2011-04-20 DIAGNOSIS — R42 Dizziness and giddiness: Secondary | ICD-10-CM

## 2011-04-20 MED ORDER — MECLIZINE HCL 25 MG PO TABS: 25.0000 mg | ORAL_TABLET | Freq: Three times a day (TID) | ORAL | Status: DC | PRN

## 2011-04-20 NOTE — Assessment & Plan Note (Signed)
New.  Day 4 of vertigo  With associated Nausea, no vomitting worsened by movement of head/ neck.  She was not a good candidate for [employer] due to high fall risk and does not seem to be a good candidate for eppley maneuvers at home either.  I do not see any sign of ear infection or clinical dehydration.  Will treat with Antivert this wk and avoid rapid movements of head.  REst and hydrate well.  If not improving in 72 hrs, please call and will get her into ENT.

## 2011-04-20 NOTE — Patient Instructions (Signed)
Take Meclizine up to 3 x a day for vertigo.  Be slow with position changes and neck ROM  If not resolved by Friday, please call.

## 2011-04-20 NOTE — Progress Notes (Signed)
  Subjective:    Patient ID: [name], female    DOB: 03-17-1928, 77 y.o.   MRN: [id number]  HPI 77 yo WF presents for vertigo that started Saturday morning.  She is OK if she sits still.  She feels nauseated but has not vomitted.  She has never had vertigo in the past.  Denies any ear pain.  Denies any preceding cold symptoms.  She is trying to keep more fluids in her and has been elevating her legs more.  Denies HAs, vomitting or diarrhea.  No change to her meds recently.  Denies CP , palpitations or DOE.  She describes her dizziness as 'the room spinning' if she turns her head too fast.  BP 144/74  Pulse 75  Temp(Src) 98.3 F (36.8 C) (Oral)  Ht 5\' 8"  (1.727 m)  Wt 189 lb (85.73 kg)  BMI 28.74 kg/m2  SpO2 96%    Review of Systems  Constitutional: Negative for fever and fatigue.  HENT: Negative for ear pain, congestion and tinnitus.   Eyes: Negative for visual disturbance.  Respiratory: Negative for shortness of breath.   Cardiovascular: Positive for leg swelling. Negative for chest pain and palpitations.  Neurological: Positive for dizziness and light-headedness. Negative for weakness, numbness and headaches.  Psychiatric/Behavioral: Negative for decreased concentration. The patient is not nervous/anxious.        Objective:   Physical Exam  Constitutional: She appears well-developed and well-nourished. No distress.  HENT:  Head: Normocephalic and atraumatic.  Right Ear: External ear normal.  Left Ear: External ear normal.  Nose: Nose normal.  Mouth/Throat: Oropharynx is clear and moist.  Eyes: Conjunctivae and EOM are normal. Pupils are equal, round, and reactive to light. Left eye exhibits no discharge. No scleral icterus.  Neck: Neck supple. No JVD present. No thyromegaly present.       Limited ROM due to dizziness  Cardiovascular: An irregularly irregular rhythm present.  Pulmonary/Chest: Effort normal and breath sounds normal. No respiratory distress.    Musculoskeletal: She exhibits edema (1+ pitting LE edema, chronic).  Lymphadenopathy:    She has no cervical adenopathy.  Neurological:       No tremor  Skin: Skin is warm. No rash noted. No pallor.  Psychiatric: She has a normal mood and affect.          Assessment & Plan:

## 2011-04-23 ENCOUNTER — Telehealth: Payer: Self-pay | Admitting: Family Medicine

## 2011-04-23 DIAGNOSIS — R42 Dizziness and giddiness: Secondary | ICD-10-CM

## 2011-04-23 NOTE — Telephone Encounter (Signed)
Patient's daughter called into office to let Dr. Cathey Endow know that [name] is seeing improvement but still dizzy.  Please call Cheryl Flash (daughter) at [phone].

## 2011-04-26 NOTE — Telephone Encounter (Signed)
Pt states dizziness is not as bad but is still having and agrees to go to ENT

## 2011-04-26 NOTE — Telephone Encounter (Signed)
I reviewed my notes and did say that if her dizziness had not resolved by today, we'd get her in with ENT, so I will go ahead and set this up.

## 2011-05-05 ENCOUNTER — Other Ambulatory Visit: Payer: Self-pay | Admitting: *Deleted

## 2011-05-05 MED ORDER — PANTOPRAZOLE SODIUM 40 MG PO TBEC: 40.0000 mg | DELAYED_RELEASE_TABLET | Freq: Every day | ORAL | Status: DC

## 2011-05-05 MED ORDER — TEMAZEPAM 15 MG PO CAPS: 15.0000 mg | ORAL_CAPSULE | Freq: Every evening | ORAL | Status: DC | PRN

## 2011-05-11 NOTE — H&P (Signed)
NAMEDORTHEA, MAINA NO.:  [id number]   MEDICAL RECORD NO.:  [id number]          PATIENT TYPE:  OBV   LOCATION:  4703                         FACILITY:  [facility]   PHYSICIAN:  Nestor Ramp, MD        DATE OF BIRTH:  [birth year]   DATE OF ADMISSION:  06/24/2009  DATE OF DISCHARGE:                              HISTORY & PHYSICAL   PRIMARY CARE Caylen Yardley:  Seymour Bars, D.O. at [facility]   HISTORY OF PRESENT ILLNESS:  The patient is a 77 year old female with  insulin dependent diabetes who presents via EMS to the emergency  department after experiencing several syncopal episodes beginning this  evening.  The patient reports that she was at [employer] at about 6:30  p.m. and had the sudden onset of weakness, decreased level of  consciousness, things turning gray.  Daughter caught the patient at  the onset of her first syncopal episode and set her easily to the  ground.  The patient was able to get back up, but felt swimmy headed  and the patient was able to be helped to the cash register at [place]  were another episode then occurred.  The patient was again caught and  helped up.  The patient made it up to the car where yet another episode  occurred.  In total, five episodes occurred this evening, each lasting  only a few seconds.  The patient reports noticing things turning gray  with the episodes.  She then felt swimmy headed afterwards as already  described.  Since arrival to the emergency department, the patient has  felt markedly improved.  She has experienced no further episodes.  She  has been able to ambulate to the restroom without difficulty.   Of note, the patient reports she never experienced anything like this in  the past.  She had a normal heart catheterization by Dr. Allyson Sabal in  February 2010 which showed normal coronary arteries.  She not had any  recent adjustment to her medications.  She last saw Dr. Cathey Endow on June 10  who increased  her Lantus from 30-40, but the patient reports that she  has not made this increase herself.  The patient denies any recent  hypoglycemic episodes.  The patient reports that her sugar this morning  was up to the 300 and she took a little bit extra NovoLog tab to get  this down which she often does.  The patient has felt well over the last  days and weeks and denies any recent viral illness or malaise.   ALLERGIES:  1. ACE INHIBITORS  2. BACTRIM.   MEDICATIONS:  1. Aspirin 81 mg p.o. daily.  2. Calcium with vitamin D supplement p.o. daily.  3. Hyoscyamine sulfate 0.375 mg 1 tablet p.o. daily.  4. Lantus 30 units subcu nightly.  5. Metoprolol 50 mg p.o. b.i.d.  6. NovoLog 5 units with breakfast, 6 units with lunch, and 8 units      with dinner.  7. Lasix 20 mg p.o. daily.  8. Pravastatin 40 mg p.o. nightly.  9. Restoril  15 mg p.o. nightly.  10.Azor 10/40 mg 1 p.o. daily.  11.Omeprazole 20 mg p.o. daily.  12.Clonidine 0.1 mg p.o. b.i.d.  13.Bentyl 10 mg one p.o. b.i.d. as needed for diarrhea.  14.Gabapentin 600 mg 1 tablet p.o. b.i.d.  15.Nitroglycerin 0.4 mg sublingual as needed, the patient rarely has      used.   PAST MEDICAL HISTORY:  1. Coronary artery disease with a normal cath in February 2010 by Dr.      Allyson Sabal.  2. History of diastolic heart failure.  Last echo in January 2008      showing ejection fraction of 60% with mild aortic thickening.  3. Chronic diarrhea.  4. History of cataracts status post surgery 1-2 months ago.  5. Chronic kidney disease stage 3, baseline creatinine 1.3 to 1.4.  6. Peptic ulcer disease, omeprazole.  7. Osteoporosis.  8. Hypertension.  9. IBS.  10.Chronic lower extremity edema.  11.Hiatal hernia.  12.History of hyperplastic colonic polyps.   PAST SURGICAL HISTORY:  1. A 2-D echo in January 2008 showed an ejection fraction of 60%.      Echo in 200& showed abnormal left ventricular relaxation, mild      aortic valve regurgitation, and  mild right ventricle dilation.  2. Status post appendectomy.  3. Carotid Dopplers in 2007 showed no significant stenosis.  4. EGD has shown esophageal stricture and gastric ulcer.  5. Normal heart catheterization in February 2010 by Dr. Allyson Sabal.   SOCIAL HISTORY:  The patient is a widow x18 years.  She is retired from  [employer].  She has 2 daughters, one of whom she lives with named [name].  Her phone number is [phone].  The patient lives in [place].  She  is a nonsmoker.  She is a nondrinker.  She denies drug use.   REVIEW OF SYSTEMS:  Positive for occasional headache, some mildly  increased shortness of breath above baseline over the last 1-2 months.  The patient has persistent occasional diarrhea, that is chronic.  The  patient has chronic lower extremity bilateral edema.  The patient denies  hematuria.  Denies melena.  Denies bright red blood per rectum.  Denies  chest pain.  Denies nausea or vomiting.   PHYSICAL EXAMINATION:  VITAL SIGNS:  Temperature 97.0, blood pressure  140/49, heart rate 61, respirations 26, 100% on room air.  Orthostatics  show blood pressure of 140/49, heart rate 61 supine, 152/53 for blood  pressure sitting, 146/48 for blood pressure standing with heart rate of  64.  HEENT:  EYES:  Pupils are equal, round, and reactive to light.  Extraocular muscles are intact.  Funduscopic exam is negative for any  hemorrhage or exudate.  Vision is grossly normal.  MOUTH:  Oropharynx is  pink and moist.  No erythema or discharge.  NECK:  There is no JVD.  No mass.  No tenderness to palpation.  The  patient has soft bilateral bruits over the carotid arteries.  HEART:  Regular rate and rhythm.  No murmurs, gallops, or rubs.  Normal  S1 and S2.  LUNGS:  Clear to auscultation bilaterally.  Work of breathing is  unlabored.  The patient has mildly diminished breath sounds throughout.  ABDOMEN:  Obese, soft, nontender, nondistended.  Positive bowel sounds.  EXTREMITIES:   Pulses, 2+ dorsalis pedis and radial pulses.  2+ bilateral  lower extremity pitting edema.  NEUROLOGIC:  The patient is alert and oriented.  Cranial nerves II  through XII are intact.  Strength is  normal in all extremities.  Finger-  to-nose is normal.  Romberg is negative.  Speech is articulate.  There  is no facial droop.  The patient has some mild gait and station  unsteadiness upon standing, that is a little bit beyond baseline per the  family.  The patient is readily able to stand unassisted; however, once  being helped from the bed.  SKIN:  Turgor normal.  Color normal.  No rashes.   WORKUP TO THIS POINT:  1. Head CT negative.  2. Chest x-ray normal.  D-dimer 0.37.  3. Cardiac enzymes negative x1.  BNP 109.  White blood cell count 9.2,      hemoglobin 12.3, platelets 270.  Sodium 135, potassium 5.1,      chloride 104, CO2 of 21, BUN 26, creatinine 1.8, baseline      creatinine 1.3 to 1.4, glucose is 182, calcium 9.3.  4. EKG shows right bundle-branch block, sinus rhythm, first-degree      heart block.  This is unchanged from her previous EKG in May 2008.      There is no T-wave abnormality, ST elevation, or depression.   ASSESSMENT AND PLAN:  The patient is a 77 year old female with insulin-  dependent diabetes who presents with syncopal episodes.  1. Syncope.  This does sound like true syncope.  It is of unclear      etiology at this time.  Differential diagnosis includes      dehydration, subclinical dullness, orthostatic hypotension, cardiac      arrhythmia.  EKG is normal.  Orthostatics are normal at this time      as well.  The patient has not had any obvious recent illness.  Her      creatinine is mildly elevated above baseline and could indicate      dehydration, but urinalysis is pending at this time.  Again, the      patient is not orthostatic.  We will hold the patient's metoprolol.      We will hold her Lasix as well.  We will continue her clonidine and      Azor and  we will check orthostatics in the morning.  I will order a      2-D echo cardiogram to evaluate cardiac function further.  We will      consider carotid Dopplers, they were normal in 2007.  We will      follow the patient's blood sugars, although she does not seem to      have any evidence for hypoglycemia at this time.  We will give the      patient a 500 mL bolus of normal saline x1 to help with hydration.      We will then allow her to hydrate on her own.  We will recheck an      EKG and orthostatics in the morning.  The patient is at or near her      baseline at this time and has not had any syncopal episodes since      arrival to the emergency department.  We will check a thyroid      stimulating hormone and also lipids acknowledging that her lipid      profile was excellent in February 2009 with a total of 139 and LDL      of 71.  2. Diabetes.  We will continue the patient's home Lantus and mealtime      NovoLog.  We will follow her sugars.  Her  A1c earlier this month      was around 8.  I doubt hypoglycemia in the setting.  3. Hypertension.  We will hold the patient's metoprolol and Lasix as      described.  Continue Azor and clonidine.  We will follow her blood      pressure.  She is not hypertensive currently, but her heart rate is      low, so we want to medicating AV nodal blocking agent and medicines      that could contribute to un-elicited orthostasis.  4. Right bundle-branch block.  This is stable from previous EKG.  We      will cycle cardiac enzymes.  Point of care cardiac enzymes were      negative.  5. Coronary coronary artery disease.  Normal cath in February 2010.      We will hold the patient's beta-blocker for now.  6. Prophylaxis.  Protonix and heparin 5000 units t.i.d.  7. Fluids, electrolytes, nutrition and gastrointestinal.  Carbohydrate      modified diet 500 mL bolus x1 and saline lock IV, p.o. ad lib after      that.  8. Disposition.  Placed the patient on  23 observation on telemetry.      We will check orthostatics, EKG, 2-D echo.  We will consider      carotid Dopplers if workup is negative and the patient is at      baseline tomorrow, I would anticipate an early discharge.  We will      follow the patient clinically and direct her therapy accordingly.      Myrtie Soman, MD  Electronically Signed      Nestor Ramp, MD  Electronically Signed    TE/MEDQ  D:  06/24/2009  T:  06/25/2009  Job:  [id number]

## 2011-05-12 ENCOUNTER — Encounter: Payer: Self-pay | Admitting: Family Medicine

## 2011-05-12 ENCOUNTER — Ambulatory Visit (INDEPENDENT_AMBULATORY_CARE_PROVIDER_SITE_OTHER): Payer: MEDICARE | Admitting: Family Medicine

## 2011-05-12 VITALS — BP 105/61 | HR 76 | Wt 192.0 lb

## 2011-05-12 DIAGNOSIS — E119 Type 2 diabetes mellitus without complications: Secondary | ICD-10-CM

## 2011-05-12 DIAGNOSIS — R42 Dizziness and giddiness: Secondary | ICD-10-CM

## 2011-05-12 DIAGNOSIS — E1149 Type 2 diabetes mellitus with other diabetic neurological complication: Secondary | ICD-10-CM

## 2011-05-12 DIAGNOSIS — N182 Chronic kidney disease, stage 2 (mild): Secondary | ICD-10-CM

## 2011-05-12 DIAGNOSIS — E039 Hypothyroidism, unspecified: Secondary | ICD-10-CM | POA: Insufficient documentation

## 2011-05-12 DIAGNOSIS — N183 Chronic kidney disease, stage 3 unspecified: Secondary | ICD-10-CM

## 2011-05-12 DIAGNOSIS — D510 Vitamin B12 deficiency anemia due to intrinsic factor deficiency: Secondary | ICD-10-CM

## 2011-05-12 LAB — POCT GLYCOSYLATED HEMOGLOBIN (HGB A1C): Hemoglobin A1C: 7.6

## 2011-05-12 NOTE — Assessment & Plan Note (Signed)
Improving with time, not on anything.  Saw ENT and is set up for formal vestibular rehab next wk.

## 2011-05-12 NOTE — Assessment & Plan Note (Signed)
Due to recheck CR today after having rise with UTI in March.  Back off on Furosemide to 1/2 tab 2-3 x a wk prn swelling

## 2011-05-12 NOTE — Assessment & Plan Note (Signed)
Not on B12 supplementation.  Will recheck level with labs today.

## 2011-05-12 NOTE — Patient Instructions (Signed)
Labs downstairs today. A1C good at 7.6.  Call me if sugars continue to run < 70.  Use Furosemide 1/2 tabs 2-3 x a wk as needed for swelling.  Return for f/u in 3 mos.

## 2011-05-12 NOTE — Progress Notes (Signed)
  Subjective:    Patient ID: [name], female    DOB: 04-04-1928, 77 y.o.   MRN: [id number]  HPI 77 yo WF presents for f/u T2DM.  Her AM fastings are running 120s- 140s AM.  She had 2 readings in the 60s.  She is injecting Lantus 18 units 2 x a day and Novolog with meals.  She saw ENT for her vertigo which is getting better with time but she is scheduled for formal vestibular rehab.  Denies symptoms of UTI.  She is taking lasix 1/2 tab about 2-3 x a wk.  She is voiding more often but no dysuria or urgency.  She is o/w doing well.  Due for labs today to recheck creatinine (up last visit to 1.6 with a UTI) and TSH after adjusting her dose up 2 mos ago.    BP 105/61  Pulse 76  Wt 192 lb (87.091 kg)  SpO2 96%   Review of Systems  Constitutional: Negative for fever, fatigue and unexpected weight change.  HENT: Positive for neck pain.   Eyes: Negative for visual disturbance.  Respiratory: Negative for cough and shortness of breath.   Cardiovascular: Positive for leg swelling. Negative for chest pain and palpitations.  Gastrointestinal: Negative for constipation.  Genitourinary: Positive for frequency. Negative for dysuria and urgency.  Neurological: Negative for headaches.  Psychiatric/Behavioral: Negative for dysphoric mood. The patient is not nervous/anxious.        Objective:   Physical Exam  Constitutional: She appears well-developed and well-nourished. No distress.  Eyes: Conjunctivae are normal. No scleral icterus.  Neck: Neck supple.  Cardiovascular:  Murmur heard.      irreg irreg with 2/ 6 systolic murmur  Pulmonary/Chest: Effort normal and breath sounds normal.  Musculoskeletal: She exhibits edema (L>R LE edema, 2+ pitting).  Skin: Skin is warm and dry.  Psychiatric: She has a normal mood and affect.          Assessment & Plan:

## 2011-05-12 NOTE — Assessment & Plan Note (Signed)
A1C OK at 7.6.  Continue Lantus 18 units bid and Novolog with meals.  Call if sugars continue to run low but I suspect this was from meal skipping.

## 2011-05-12 NOTE — Assessment & Plan Note (Signed)
TSH due today given dose adjustment up 2 mos ago

## 2011-05-13 ENCOUNTER — Telehealth: Payer: Self-pay | Admitting: Family Medicine

## 2011-05-13 LAB — BASIC METABOLIC PANEL WITH GFR
Chloride: 102 mEq/L (ref 96–112)
Creat: 2.12 mg/dL — ABNORMAL HIGH (ref 0.40–1.20)
GFR, Est African American: 27 mL/min — ABNORMAL LOW (ref >60)
GFR, Est Non African American: 22 mL/min — ABNORMAL LOW (ref >60)

## 2011-05-13 LAB — TSH: TSH: 2.344 u[IU]/mL (ref 0.350–4.500)

## 2011-05-13 LAB — VITAMIN B12: Vitamin B-12: 368 pg/mL (ref 211–911)

## 2011-05-13 NOTE — Telephone Encounter (Signed)
Pt aware of the above. She will CB and let us know about the Kidney Specialist

## 2011-05-13 NOTE — Telephone Encounter (Signed)
Pls let pt know that her B12 level is low normal and trending down.  I'd recommend returning for once a month B12 injections for the next 4 mos, then repeating her level.  Thyroid looks perfect on current dose of medicaiton now.  Her sugar was high on labs at 294 and her kidney function appears worse even on lower doses of duiretics.    I'd like to get her in with a kidney specialist.  Does she prefer [place] or [place]?

## 2011-05-14 NOTE — Discharge Summary (Signed)
[name], [name] NO.:  [id number]   MEDICAL RECORD NO.:  [id number]          PATIENT TYPE:  OBV   LOCATION:  4703                         FACILITY:  [facility]   PHYSICIAN:  Paula Compton, MD        DATE OF BIRTH:  30-Apr-1928   DATE OF ADMISSION:  06/24/2009  DATE OF DISCHARGE:  06/25/2009                               DISCHARGE SUMMARY   PRIMARY CARE PHYSICIAN:  Dr. Cathey Endow of [facility].   DISCHARGE DIAGNOSES:  1. Syncope.  2. Diabetes mellitus type 2.  3. Hypertension.  4. Coronary artery disease.  5. Hypothyroidism.   DISCHARGE MEDICATIONS:  1. Aspirin 81 mg by mouth daily.  2. Hyoscyamine sulfate 0.375 mg daily.  3. Lantus 30 units subcu at night.  4. Neurontin 600 mg by mouth twice daily.  5. NovoLog per previous home doses.  6. Pravastatin 40 mg at night.  7. Azor 10/40 by mouth daily.  8. Clonidine 0.1 mg by mouth twice daily.  9. Oyster Cal by mouth twice daily.  10.Lasix 40 mg one-half tablet by mouth twice daily.  11.Omeprazole 20 mg by mouth daily.  12.Bentyl 10 mg as needed for diarrhea.  13.Restoril 15 mg tabs at bedtime as needed for sleep.  14.Synthroid 12.5 mcg by mouth daily.  The patient is instructed not to take her previous dose of metoprolol.   IMAGES:  Chest x-ray on June 24, 2009, impression; no active disease.  CT of the head without contrast on June 24, 2009. no acute intracranial  abnormality.   Sodium 137, potassium 4.6, chloride 107, CO2 of 23, glucose 239, BUN 27,  creatinine 1.58, and calcium 8.7.  Cholesterol 122, triglyceride 131,  HDL 31, and LDL 65.  Cardiac enzymes were negative x3.  TSH 12.766.  Urine showed moderate leukocyte esterase, 0-2 white blood cells with  urine culture of 100,000 with multiple bacterial morphocytes present.  BNP 109.  D-dimer 0.37.   BRIEF HOSPITAL COURSE:  Ms. [name] is a 77 year old female who  presented with syncope.  Please see H and P for further details.  1. Syncope.   The etiology of this remains unclear.  The likely      diagnoses include dehydration/clinical      arrhythmia/bradycardia/hypothyroidism.  The patient was noted to be      on no new medications with no recent illnesses.  She was monitored      overnight with no change in EKG, no tele events, no incidence of      hypoglycemia.  Cardiac enzymes remained negative.  She was found to      have a slightly elevated creatinine and was found to be drawn on      exam making her possible presyncope/syncope episodes secondary to      dehydration.  She was also found to have an elevated TSH indicating      that she has hypothyroidism.  Because the patient was feeling well      on the morning, had a negative CT of head, negativ3e chest x-ray,      and  negative workup as above as well as a recent 2-D echo, she was      allowed to be discharged home with close follow up with her primary      care physician, Dr. Cathey Endow.  She was evaluated by physical therapy      as well and found to be steady and independent while walking.  The      patient was noted to have significant hypertension history, several      medications for this problem.  She did admit to feeling funny      headed on the morning prior to discharge.  Orthostatics were      negative.  The patient's Lasix and metoprolol were held when the      patient was admitted.  The patient's hypertension was not      unexpected.  Her Lasix was added back to her regimen for her blood      pressure control.  However, metoprolol was held and the patient was      consistently bradycardic in the 50s throughout her stay.  This can      be followed up with her primary care physician.  2. Diabetes mellitus type 2.  The patient was not found to be      hypoglycemic at any time.  We will continue her on her home      medication.  Her A1c this month was 8.  3. Chronic hypertension.  Please see #1.  4. Coronary artery disease.  The patient was noted to have normal       coronaries on February 2010.  Continue home medications.   FOLLOWUP:  The patient is instructed to follow up with her primary care  physician, Dr. Cathey Endow.   FOLLOWUP ISSUES:  1. Monitor blood pressure.  2. Monitor for any more presyncopal/syncopal episode.  3. As Synthroid was started at a very low dose, this medication will      need to be adjusted.      Helane Rima, MD  Electronically Signed      Paula Compton, MD  Electronically Signed    EW/MEDQ  D:  06/30/2009  T:  07/01/2009  Job:  [id number]

## 2011-05-17 ENCOUNTER — Ambulatory Visit (INDEPENDENT_AMBULATORY_CARE_PROVIDER_SITE_OTHER): Payer: Medicare Other | Admitting: Family Medicine

## 2011-05-17 ENCOUNTER — Telehealth: Payer: Self-pay | Admitting: Family Medicine

## 2011-05-17 DIAGNOSIS — D510 Vitamin B12 deficiency anemia due to intrinsic factor deficiency: Secondary | ICD-10-CM

## 2011-05-17 DIAGNOSIS — E538 Deficiency of other specified B group vitamins: Secondary | ICD-10-CM

## 2011-05-17 MED ORDER — CYANOCOBALAMIN 1000 MCG/ML IJ SOLN
1000.0000 ug | Freq: Once | INTRAMUSCULAR | Status: AC
  Administered 2011-05-17: 1000 ug via INTRAMUSCULAR

## 2011-05-17 NOTE — Progress Notes (Signed)
  Subjective:    Patient ID: [name], female    DOB: February 13, 1928, 77 y.o.   MRN: [id number]  HPI n   Review of Systems     Objective:   Physical Exam        Assessment & Plan:

## 2011-05-20 ENCOUNTER — Telehealth: Payer: Self-pay | Admitting: Family Medicine

## 2011-05-20 NOTE — Telephone Encounter (Signed)
I had a piece of paper on my desk to check the status on a Nephrology referral for this patient and I do not see where a referral has been put in for this patient. thanks

## 2011-05-21 NOTE — Telephone Encounter (Signed)
Addended by: Glennis Brink on: 05/21/2011 04:12 PM   Modules accepted: Orders

## 2011-05-27 NOTE — Telephone Encounter (Signed)
Closed. Tiphanie Vo, LPN /Triage  

## 2011-05-28 NOTE — Telephone Encounter (Signed)
Haskell Riling has faxed over this patients referral to [facility] in [place] to be scheduled

## 2011-05-28 NOTE — Telephone Encounter (Signed)
Pt informed that her referral process to [facility] is in the process. Jarvis Newcomer, LPN [facility]

## 2011-06-16 ENCOUNTER — Ambulatory Visit (INDEPENDENT_AMBULATORY_CARE_PROVIDER_SITE_OTHER): Payer: Medicare Other | Admitting: Family Medicine

## 2011-06-16 DIAGNOSIS — E538 Deficiency of other specified B group vitamins: Secondary | ICD-10-CM

## 2011-06-16 MED ORDER — CYANOCOBALAMIN 1000 MCG/ML IJ SOLN
1000.0000 ug | INTRAMUSCULAR | Status: DC
  Administered 2011-06-16: 1000 ug via INTRAMUSCULAR

## 2011-06-16 NOTE — Progress Notes (Signed)
  Subjective:    Patient ID: [name], female    DOB: 1928/06/06, 77 y.o.   MRN: [id number]  HPI  b  Review of Systems     Objective:   Physical Exam        Assessment & Plan:

## 2011-08-19 ENCOUNTER — Ambulatory Visit (INDEPENDENT_AMBULATORY_CARE_PROVIDER_SITE_OTHER): Payer: Medicare Other | Admitting: Family Medicine

## 2011-08-19 ENCOUNTER — Encounter: Payer: Self-pay | Admitting: Family Medicine

## 2011-08-19 DIAGNOSIS — E039 Hypothyroidism, unspecified: Secondary | ICD-10-CM

## 2011-08-19 DIAGNOSIS — E1149 Type 2 diabetes mellitus with other diabetic neurological complication: Secondary | ICD-10-CM

## 2011-08-19 DIAGNOSIS — R809 Proteinuria, unspecified: Secondary | ICD-10-CM

## 2011-08-19 DIAGNOSIS — E119 Type 2 diabetes mellitus without complications: Secondary | ICD-10-CM

## 2011-08-19 DIAGNOSIS — N390 Urinary tract infection, site not specified: Secondary | ICD-10-CM

## 2011-08-19 DIAGNOSIS — D510 Vitamin B12 deficiency anemia due to intrinsic factor deficiency: Secondary | ICD-10-CM

## 2011-08-19 LAB — POCT URINALYSIS DIPSTICK
Protein, UA: 100
Spec Grav, UA: >1.03

## 2011-08-19 MED ORDER — TEMAZEPAM 15 MG PO CAPS: 15.0000 mg | ORAL_CAPSULE | Freq: Every evening | ORAL | Status: DC | PRN

## 2011-08-19 MED ORDER — CLONIDINE HCL 0.1 MG PO TABS: 0.1000 mg | ORAL_TABLET | Freq: Two times a day (BID) | ORAL | Status: DC

## 2011-08-19 MED ORDER — PRAVASTATIN SODIUM 40 MG PO TABS: 40.0000 mg | ORAL_TABLET | Freq: Every day | ORAL | Status: DC

## 2011-08-19 MED ORDER — INSULIN ASPART 100 UNIT/ML ~~LOC~~ SOLN: 8.0000 [IU] | Freq: Three times a day (TID) | SUBCUTANEOUS | Status: DC

## 2011-08-19 MED ORDER — LEVOTHYROXINE SODIUM 100 MCG PO TABS: 100.0000 ug | ORAL_TABLET | Freq: Every day | ORAL | Status: DC

## 2011-08-19 MED ORDER — WARFARIN SODIUM 5 MG PO TABS: 5.0000 mg | ORAL_TABLET | Freq: Every day | ORAL | Status: DC

## 2011-08-19 MED ORDER — CEPHALEXIN 500 MG PO CAPS: 500.0000 mg | ORAL_CAPSULE | Freq: Two times a day (BID) | ORAL | Status: AC

## 2011-08-19 MED ORDER — HYDROCODONE-ACETAMINOPHEN 5-325 MG PO TABS: 1.0000 | ORAL_TABLET | Freq: Four times a day (QID) | ORAL | Status: DC | PRN

## 2011-08-19 MED ORDER — PANTOPRAZOLE SODIUM 40 MG PO TBEC: 40.0000 mg | DELAYED_RELEASE_TABLET | Freq: Every day | ORAL | Status: DC

## 2011-08-19 MED ORDER — CYANOCOBALAMIN 1000 MCG/ML IJ SOLN
1000.0000 ug | Freq: Once | INTRAMUSCULAR | Status: AC
  Administered 2011-08-19: 1000 ug via INTRAMUSCULAR

## 2011-08-19 NOTE — Assessment & Plan Note (Signed)
B12 level was in the 300s last lab draw and she's not keeping up with monthly injections.  Feeling more tired lately.  Will go ahead and give today.

## 2011-08-19 NOTE — Progress Notes (Signed)
  Subjective:    Patient ID: [name], female    DOB: Apr 27, 1928, 77 y.o.   MRN: [id number]  HPI 77 yo WF presents for f/u diabetes.  Her A1c is 7.7 from 7.6.  She has a long hx of dietary non compliance.  She is using her Lantus 18 units 2 x a day and Novolog with meals.  She is going on a cruise in Sept.  She has been feeling rather tired lately and sugars are running higher than usual.  She is overdue for her B12 shot.  Her level was still pretty low in May.  She was Dr Bland Span in June for her stage III CKD and has f/u in Sept.  She had her TSH done and it had improved in May.  She has not been having to take her Lasix for swelling.  She is due to RF her meds.    BP 124/70  Pulse 97  Ht 5\' 7"  (1.702 m)  Wt 193 lb (87.544 kg)  BMI 30.23 kg/m2   Review of Systems  Constitutional: Positive for fatigue.  Eyes: Negative for visual disturbance.  Respiratory: Negative for cough and shortness of breath.   Cardiovascular: Positive for leg swelling (chronic). Negative for chest pain and palpitations.  Neurological: Negative for weakness and headaches.  Psychiatric/Behavioral: Negative for dysphoric mood.       Objective:   Physical Exam  Constitutional: She appears well-developed and well-nourished.  HENT:       O/p fairly dry  Eyes: No scleral icterus.  Neck: No thyromegaly present.  Cardiovascular: Normal rate and regular rhythm.   Murmur heard. Pulmonary/Chest: Effort normal and breath sounds normal.  Musculoskeletal: She exhibits edema (1+ pedal edema bilat).  Skin: Skin is warm and dry.  Psychiatric: She has a normal mood and affect.          Assessment & Plan:

## 2011-08-19 NOTE — Assessment & Plan Note (Signed)
Hx of recurrent UTI and CKD.  UA is + today though her only symptoms are higher sugar readings and fatigue.  Will treat with Keflex 2 x a day for 7 days and f/u cx results.

## 2011-08-19 NOTE — Assessment & Plan Note (Signed)
A1C fairly good at 7.7, essentially unchanged.  Long hx of poor diet, lack of exercise which has made it hard to tightly control her.  She is also sporadic on use of Novolog.  Continue current regimen, encouraging compliance.  She will RTC for flu shot in October.  RFd meds.  Labs are UTD.  Eye exam is UTD.  Complicated by neuropathy and nephropathy.  Higher sugars recently likely from current UTI and should improve with abx.

## 2011-08-19 NOTE — Assessment & Plan Note (Signed)
TSH looked perfect at last lab draw.  RFd her thyroid meds for another 6 mos today.

## 2011-08-19 NOTE — Patient Instructions (Signed)
Start Keflex 2 x  Day for 7 days for UTI. Will call you with culture results next year.  Stay on current meds. A1C ok at 7.7.  Meds RFd today.  Return for f/u in 3 mos.  Come in for a FLU SHOT in October.

## 2011-08-22 LAB — URINE CULTURE: Colony Count: >=100000

## 2011-08-25 ENCOUNTER — Telehealth: Payer: Self-pay | Admitting: Family Medicine

## 2011-08-25 NOTE — Telephone Encounter (Signed)
Call pt: urine culture + for klebsiella. Should be senstive to the kefelx.

## 2011-08-25 NOTE — Telephone Encounter (Signed)
Line busy

## 2011-08-31 NOTE — Telephone Encounter (Signed)
Pt notified of results and MD instructions. KJ LPN 

## 2011-09-02 ENCOUNTER — Encounter: Payer: Self-pay | Admitting: Family Medicine

## 2011-09-02 ENCOUNTER — Ambulatory Visit (INDEPENDENT_AMBULATORY_CARE_PROVIDER_SITE_OTHER): Payer: Medicare Other | Admitting: Family Medicine

## 2011-09-02 VITALS — BP 141/72 | HR 101 | Wt 193.0 lb

## 2011-09-02 DIAGNOSIS — M5430 Sciatica, unspecified side: Secondary | ICD-10-CM

## 2011-09-02 MED ORDER — PREDNISONE 20 MG PO TABS: ORAL_TABLET | ORAL | Status: DC

## 2011-09-02 NOTE — Progress Notes (Signed)
  Subjective:    Patient ID: [name], female    DOB: 1928-03-29, 77 y.o.   MRN: [id number]  HPI  ED follow up for sciatica-  [place] [facility].  It is on the right side.  Gaver her some pain medication. They felt she might benefit from prednisone but they didn't give it to her because of her sugars  Sugars are 238.   Lantus 18 bid and novolog at mealtime.  Pain started in her back and then started radiating into her right upper leg.  Say this weekend slipped trying to get into the truck and cut a place on her RT leg.  They placed staples over the wound.  She says they did an xray there and was told just has some arthritis.   Review of Systems     Objective:   Physical Exam  Vitals reviewed. Constitutional: She is oriented to person, place, and time. She appears well-developed and well-nourished.       Uses a walker.   HENT:  Head: Normocephalic and atraumatic.  Musculoskeletal: She exhibits edema.       Nontender over the lumbar spine.  No SI joint tenderness. + straight leg raiseon the RT.  Knee, hip, and ankle strength is 5/5 bilat. Uses a walker so I didn't have her do flexion and extension secondary to balance issues.  Trace ankle edema bilat.   Neurological: She is alert and oriented to person, place, and time.  Skin: Skin is warm and dry.       He has a laceration to her right shin that has steri-strips placed. No sign of infection.  Redressed her wound. She dose have a large hematoma around the shin area.   Psychiatric: She has a normal mood and affect. Her behavior is normal.          Assessment & Plan:  Sciatica- Will start prednisone which she has used in the past and tolerate well. Will have to adjust her insulin while on the prednisone. She agrees and understands. Given H.O on low back pian. Consider PT if not getting better. She is leaving for a cruise next Friday so she doesn't want to do PT at this time. Can also use Tyelnol prn for pain con trol.   Will get flu  vaccine after her cruise.

## 2011-09-02 NOTE — Patient Instructions (Signed)
Call if you are not getting better.  Increase your insulin while you are on the prednisone

## 2011-09-17 ENCOUNTER — Ambulatory Visit (INDEPENDENT_AMBULATORY_CARE_PROVIDER_SITE_OTHER): Payer: Medicare Other | Admitting: Family Medicine

## 2011-09-17 ENCOUNTER — Encounter: Payer: Self-pay | Admitting: Family Medicine

## 2011-09-17 VITALS — BP 136/59 | HR 95 | Wt 198.0 lb

## 2011-09-17 DIAGNOSIS — Z23 Encounter for immunization: Secondary | ICD-10-CM

## 2011-09-17 DIAGNOSIS — M5430 Sciatica, unspecified side: Secondary | ICD-10-CM

## 2011-09-17 MED ORDER — OXYCODONE-ACETAMINOPHEN 5-325 MG PO TABS: 1.0000 | ORAL_TABLET | Freq: Three times a day (TID) | ORAL | Status: AC | PRN

## 2011-09-17 NOTE — Progress Notes (Signed)
  Subjective:    Patient ID: [name], female    DOB: Oct 19, 1928, 77 y.o.   MRN: [id number]  HPI Sciatica is getting worse.  Right side. Feel more week in the that leg. [place] twice on her cruise ship. Painful to roll over in bed.  She has pain medicine at home.  Says the steroid did help.  She cannot cruise and had a good time to have significant pain. She is asked to phone a couple of times since then. She says the pain mostly starts in her low back and shoots into her right and down her back side. It does not go all the way to her foot. It is worse with motion and activity and walking. She does use a cane. She is out of the oxycodone that they gave her at the emergency room but did say that it was helpful for her pain and she did not feel that was overly sedating though she does feel a little bit more sleepy on it.   Review of Systems Past Medical History  Diagnosis Date  . History of kidney stones   . History of colonic polyps   . Atrial fibrillation 1/11     Dr Allyson Sabal  . Ulcer     Gastric  . Esophageal stricture   . Hiatal hernia   . Diarrhea   . Memory loss   . Heart failure     chronic diatolic- 2 D echo 1/08 EF 60%  . Microalbuminuria   . CAD (coronary artery disease)   . Atherosclerosis   . Anemia     Pernicious  . Diabetes mellitus     w/neuro manifestations, type II  . Chronic kidney disease     chronic, stage III  . Cystitis   . Osteoarthrosis involving multiple sites   . Swelling     limb  . Back pain   . Peptic ulcer     unspec. w/o obstruction  . Senile osteoporosis   . IBS (irritable bowel syndrome)   . Hypertension       Objective:   Physical Exam  Constitutional: She is oriented to person, place, and time. She appears well-developed and well-nourished.  Musculoskeletal:       Normal lumbar flexion. Decreased extension and pain. Normal side bending to the right. Decreased side bending to the left and pain. Normal rotation right and left. She is able  to lift both knees but has significant pain with lifting her right and her buttock area. Patellar reflexes are 1+ bilaterally. Knee and ankle strength are 5 over 5.  Neurological: She is alert and oriented to person, place, and time.  Skin: Skin is warm and dry.          Assessment & Plan:  Sciatica, right-at this point I recommend physical therapy. If she is not getting better I will schedule her with an orthopedist. She cannot do an MRI because she has a pacemaker. Because of her age I don't really want her to take anti-inflammatories. So I did give her prescription of oxycodone which seems to help her better than the hydrocodone. She is to monitor for sedation and constipation.

## 2011-09-20 ENCOUNTER — Ambulatory Visit (INDEPENDENT_AMBULATORY_CARE_PROVIDER_SITE_OTHER): Payer: Medicare Other | Admitting: Family Medicine

## 2011-09-20 VITALS — BP 110/66 | HR 90 | Temp 98.6°F | Resp 20 | Wt 193.0 lb

## 2011-09-20 DIAGNOSIS — L0390 Cellulitis, unspecified: Secondary | ICD-10-CM

## 2011-09-20 DIAGNOSIS — I83009 Varicose veins of unspecified lower extremity with ulcer of unspecified site: Secondary | ICD-10-CM

## 2011-09-20 DIAGNOSIS — L97909 Non-pressure chronic ulcer of unspecified part of unspecified lower leg with unspecified severity: Secondary | ICD-10-CM

## 2011-09-20 MED ORDER — CEPHALEXIN 500 MG PO CAPS: 500.0000 mg | ORAL_CAPSULE | Freq: Three times a day (TID) | ORAL | Status: AC

## 2011-09-20 NOTE — Progress Notes (Signed)
  Subjective:    Patient ID: [name], female    DOB: [birth year], 77 y.o.   MRN: [id number]  HPI  Pt presents this PM for nurse visit to have RT lower leg dressed for nickel size wound.  Minimal drainage yellowish in color per old dressing noted.  Pt afebrile,and feels wound does look better based on original way it looked.  Dr. Linford Arnold to observe the RT lower leg wound.  Redness at the area and some swelling.     Review of Systems     Objective:   Physical Exam  RT lower leg with large scabbed area that is starting to heal, thought it does have some significant erythema around it.      Assessment & Plan:  Ulcer from venous stasis - Replace Unaboot. F/U in 3 days. Keep feet elevated when able.  Early cellulitis - Place on keflex.

## 2011-09-22 ENCOUNTER — Ambulatory Visit: Payer: Medicare Other | Attending: Family Medicine | Admitting: Physical Therapy

## 2011-09-22 DIAGNOSIS — M25559 Pain in unspecified hip: Secondary | ICD-10-CM | POA: Insufficient documentation

## 2011-09-22 DIAGNOSIS — IMO0001 Reserved for inherently not codable concepts without codable children: Secondary | ICD-10-CM | POA: Insufficient documentation

## 2011-09-22 DIAGNOSIS — M6281 Muscle weakness (generalized): Secondary | ICD-10-CM | POA: Insufficient documentation

## 2011-09-22 DIAGNOSIS — R269 Unspecified abnormalities of gait and mobility: Secondary | ICD-10-CM | POA: Insufficient documentation

## 2011-09-22 LAB — BASIC METABOLIC PANEL: BUN: 21 mg/dL (ref 4–21)

## 2011-09-23 ENCOUNTER — Ambulatory Visit (INDEPENDENT_AMBULATORY_CARE_PROVIDER_SITE_OTHER): Payer: Medicare Other | Admitting: Family Medicine

## 2011-09-23 VITALS — BP 124/68 | HR 66 | Temp 98.7°F | Resp 20 | Ht 67.0 in | Wt 194.0 lb

## 2011-09-23 DIAGNOSIS — L97909 Non-pressure chronic ulcer of unspecified part of unspecified lower leg with unspecified severity: Secondary | ICD-10-CM

## 2011-09-23 DIAGNOSIS — I878 Other specified disorders of veins: Secondary | ICD-10-CM

## 2011-09-23 DIAGNOSIS — I83009 Varicose veins of unspecified lower extremity with ulcer of unspecified site: Secondary | ICD-10-CM

## 2011-09-23 DIAGNOSIS — IMO0002 Reserved for concepts with insufficient information to code with codable children: Secondary | ICD-10-CM

## 2011-09-23 DIAGNOSIS — T148XXA Other injury of unspecified body region, initial encounter: Secondary | ICD-10-CM

## 2011-09-23 DIAGNOSIS — M7989 Other specified soft tissue disorders: Secondary | ICD-10-CM

## 2011-09-23 DIAGNOSIS — I872 Venous insufficiency (chronic) (peripheral): Secondary | ICD-10-CM

## 2011-09-23 NOTE — Progress Notes (Signed)
  Subjective:    Patient ID: [name], female    DOB: 26-Oct-1928, 77 y.o.   MRN: [id number]  HPI Patient is here to followup gland on her left anterior shin. Her number was removed and it was cleaned and washed and redressed today. It does appear to be healing. The erythema around the wound seems to be slightly less. The wound itself is more shallow. It does have a dry scab in the center and is not weeping or oozing. Posterior today and we will see her back on Monday to recheck the weight.   Review of Systems     Objective:   Physical Exam        Assessment & Plan:

## 2011-09-23 NOTE — Progress Notes (Signed)
  Subjective:    Patient ID: EVONNE RINKS, female    DOB: 1928/12/13, 77 y.o.   MRN: [id number]  HPI    Review of Systems     Objective:   Physical Exam        Assessment & Plan:  Pt presents today for 3 day follow up for nurse visit for wound RT lower leg.  Still red and wound area same as nickel size as 3 days ago.  Some greenish, minimal blood drainage noted to dsg approx dime size.  Pt does not complain of any pain with the area.  Dsg removed for the provider to evaluate and pt is afebrile.  RT wound to right lower leg looks some better and less angry, less redness.  Nickel size area is starting to granulate a scab.   Plan:  Removed and redressed after wound area was cleaned with antibacterial soap and dried per provider order.  Jarvis Newcomer, LPN [facility]

## 2011-09-23 NOTE — Patient Instructions (Signed)
Pt was instructed to keep new dressing on for 4 days and to follow up Monday 09-27-11 for nurse visit to redress RT leg.   Wound to RT lower leg some improved and less angry looking. Jarvis Newcomer, LPN [facility]

## 2011-09-27 ENCOUNTER — Ambulatory Visit (INDEPENDENT_AMBULATORY_CARE_PROVIDER_SITE_OTHER): Payer: Medicare Other | Admitting: Family Medicine

## 2011-09-27 ENCOUNTER — Telehealth: Payer: Self-pay | Admitting: Family Medicine

## 2011-09-27 VITALS — BP 130/72 | HR 89 | Temp 98.4°F | Resp 20 | Ht 67.0 in | Wt 194.0 lb

## 2011-09-27 DIAGNOSIS — I83009 Varicose veins of unspecified lower extremity with ulcer of unspecified site: Secondary | ICD-10-CM

## 2011-09-27 DIAGNOSIS — L97909 Non-pressure chronic ulcer of unspecified part of unspecified lower leg with unspecified severity: Secondary | ICD-10-CM

## 2011-09-27 MED ORDER — AMLODIPINE-OLMESARTAN 10-40 MG PO TABS: 1.0000 | ORAL_TABLET | Freq: Every day | ORAL | Status: DC

## 2011-09-27 NOTE — Progress Notes (Signed)
  Subjective:    Patient ID: [name], female    DOB: January 20, 1928, 77 y.o.   MRN: [id number]  HPI    Review of Systems     Objective:   Physical Exam        Assessment & Plan:  Pt presents today for a nurse visit to check her RT leg wound.  Had ulna boot in place.  Noted when dressing removed, a nickel size area starting to form scab with serosanguiness drainage to dsg with greenish color.  Less swelling noted, however; swelling below dsg area at ankle location.  Dr. Linford Arnold evaluated.   Jarvis Newcomer, LPN [facility]

## 2011-09-27 NOTE — Telephone Encounter (Signed)
Closed

## 2011-09-27 NOTE — Progress Notes (Signed)
Addended by: Nani Gasser D on: 09/27/2011 03:42 PM   Modules accepted: Level of Service

## 2011-09-27 NOTE — Patient Instructions (Signed)
Pt instructed by Dr Linford Arnold to do the following: 1.  Apply xerofoam to area daily. 2.  Then apply telfa pad/ 3.  Then apply Ace wrap. 4.  May start showering 09-28-11, and dry well, then apply clean dsg as mentioned above. 5.  Recheck with nurse visit in 1 week.  Scheduled for 10-06-11.   Pt requested samples of azor 10/40 mg.  # 14 given. Jarvis Newcomer, LPN [facility]

## 2011-09-27 NOTE — Progress Notes (Signed)
  Subjective:    Patient ID: [name], female    DOB: 03-Nov-1928, 77 y.o.   MRN: [id number]  HPI UNA boot removed. Wound cleaned with soap and water.  Xeroform placed and wrapped with ACE wrap.  Cna change dressing once a day and reapply xerfom. F/U in 1 week for recheck.    Review of Systems     Objective:   Physical Exam        Assessment & Plan:

## 2011-09-29 ENCOUNTER — Ambulatory Visit: Payer: Medicare Other | Attending: Family Medicine | Admitting: Physical Therapy

## 2011-09-29 DIAGNOSIS — IMO0001 Reserved for inherently not codable concepts without codable children: Secondary | ICD-10-CM | POA: Insufficient documentation

## 2011-09-29 DIAGNOSIS — M25559 Pain in unspecified hip: Secondary | ICD-10-CM | POA: Insufficient documentation

## 2011-09-29 DIAGNOSIS — M6281 Muscle weakness (generalized): Secondary | ICD-10-CM | POA: Insufficient documentation

## 2011-09-29 DIAGNOSIS — R269 Unspecified abnormalities of gait and mobility: Secondary | ICD-10-CM | POA: Insufficient documentation

## 2011-10-06 ENCOUNTER — Ambulatory Visit: Payer: Medicare Other | Admitting: Physical Therapy

## 2011-10-06 ENCOUNTER — Ambulatory Visit (INDEPENDENT_AMBULATORY_CARE_PROVIDER_SITE_OTHER): Payer: Medicare Other | Admitting: Family Medicine

## 2011-10-06 DIAGNOSIS — S81802A Unspecified open wound, left lower leg, initial encounter: Secondary | ICD-10-CM

## 2011-10-06 DIAGNOSIS — IMO0002 Reserved for concepts with insufficient information to code with codable children: Secondary | ICD-10-CM

## 2011-10-06 DIAGNOSIS — S80819A Abrasion, unspecified lower leg, initial encounter: Secondary | ICD-10-CM

## 2011-10-06 NOTE — Patient Instructions (Signed)
Pt instructed to apply gentle washing with fingertips, and make sure to pat dry the area after each shower. Use regular vaseline to the area. Apply large bandaid to keep the area covered to keep clean and dry. Start using the compression stockings. Call the office if wound changes or worsens or fever is noted or redness, swelling, with drainage appears. Jarvis Newcomer, LPN [facility]

## 2011-10-06 NOTE — Progress Notes (Signed)
  Subjective:    Patient ID: [name], female    DOB: 12-08-1928, 77 y.o.   MRN: [id number]  HPI    Review of Systems     Objective:   Physical Exam  Her wound is looking much better today. There is more pink around the wound instead of dark red. She does have some dead skin superficially.      Assessment & Plan:  Pt presents today for nurse visit to look at RT leg wound (has dressing). Removed telfa dsg and petroleum dsg.  Has nickel size drainage.  Formulating good granulation with minimal drainage.   Jarvis Newcomer, LPN [facility]   At this point time I recommend cleaning it with regular soap and water in the shower. She can apply some Vaseline in place a large Band-Aid over the area and make sure she is wearing her compression stockings daily.

## 2011-10-13 ENCOUNTER — Ambulatory Visit: Payer: Medicare Other | Admitting: Physical Therapy

## 2011-10-19 ENCOUNTER — Inpatient Hospital Stay (INDEPENDENT_AMBULATORY_CARE_PROVIDER_SITE_OTHER)
Admission: RE | Admit: 2011-10-19 | Discharge: 2011-10-19 | Disposition: A | Discharge status: Home / Self Care | Payer: Medicare Other | Source: Ambulatory Visit | Attending: Family Medicine | Admitting: Family Medicine

## 2011-10-19 ENCOUNTER — Telehealth: Payer: Self-pay | Admitting: Family Medicine

## 2011-10-19 ENCOUNTER — Encounter: Payer: Self-pay | Admitting: Family Medicine

## 2011-10-19 DIAGNOSIS — R471 Dysarthria and anarthria: Secondary | ICD-10-CM | POA: Insufficient documentation

## 2011-10-19 DIAGNOSIS — E1149 Type 2 diabetes mellitus with other diabetic neurological complication: Secondary | ICD-10-CM

## 2011-10-19 DIAGNOSIS — I4891 Unspecified atrial fibrillation: Secondary | ICD-10-CM

## 2011-10-19 DIAGNOSIS — R5381 Other malaise: Secondary | ICD-10-CM

## 2011-10-19 DIAGNOSIS — E039 Hypothyroidism, unspecified: Secondary | ICD-10-CM

## 2011-10-19 DIAGNOSIS — R7309 Other abnormal glucose: Secondary | ICD-10-CM

## 2011-10-19 NOTE — Telephone Encounter (Signed)
Pt called and said her BS yesterday was 60, and today her BS is 286.  Pt says she does not feel well.  Told the pt it seems that her Bs's are not controlled.  Pt takes lantus insulin 18 units am and pm.  She is on sliding scale and does approx 5 unit of novolog insulin before lunch and dinner meals.   Plan:  Pt sent to the UC. Jarvis Newcomer, LPN [facility]

## 2011-10-20 ENCOUNTER — Ambulatory Visit: Payer: Medicare Other | Admitting: Physical Therapy

## 2011-10-21 ENCOUNTER — Ambulatory Visit: Payer: Medicare Other | Admitting: Family Medicine

## 2011-10-21 ENCOUNTER — Telehealth: Payer: Self-pay | Admitting: Family Medicine

## 2011-10-21 DIAGNOSIS — Z0289 Encounter for other administrative examinations: Secondary | ICD-10-CM

## 2011-10-21 NOTE — Telephone Encounter (Signed)
Call pt to see if she is diong any better? Are her sugars stable? Did she go to UC?

## 2011-10-22 ENCOUNTER — Encounter: Payer: Self-pay | Admitting: Family Medicine

## 2011-10-22 NOTE — Telephone Encounter (Signed)
Pt did go to UC and is feeling much better

## 2011-11-10 ENCOUNTER — Other Ambulatory Visit: Payer: Self-pay | Admitting: *Deleted

## 2011-11-10 MED ORDER — NYSTATIN-TRIAMCINOLONE 100000-0.1 UNIT/GM-% EX OINT: TOPICAL_OINTMENT | Freq: Two times a day (BID) | CUTANEOUS | Status: DC

## 2011-11-24 ENCOUNTER — Ambulatory Visit (INDEPENDENT_AMBULATORY_CARE_PROVIDER_SITE_OTHER): Payer: Medicare Other | Admitting: Family Medicine

## 2011-11-24 ENCOUNTER — Other Ambulatory Visit: Payer: Self-pay | Admitting: Family Medicine

## 2011-11-24 ENCOUNTER — Telehealth: Payer: Self-pay | Admitting: Family Medicine

## 2011-11-24 ENCOUNTER — Encounter: Payer: Self-pay | Admitting: Family Medicine

## 2011-11-24 VITALS — BP 115/60 | HR 88 | Wt 193.0 lb

## 2011-11-24 DIAGNOSIS — R5381 Other malaise: Secondary | ICD-10-CM

## 2011-11-24 DIAGNOSIS — R609 Edema, unspecified: Secondary | ICD-10-CM

## 2011-11-24 DIAGNOSIS — E119 Type 2 diabetes mellitus without complications: Secondary | ICD-10-CM

## 2011-11-24 DIAGNOSIS — Z950 Presence of cardiac pacemaker: Secondary | ICD-10-CM

## 2011-11-24 DIAGNOSIS — M25473 Effusion, unspecified ankle: Secondary | ICD-10-CM

## 2011-11-24 DIAGNOSIS — R5383 Other fatigue: Secondary | ICD-10-CM

## 2011-11-24 DIAGNOSIS — R351 Nocturia: Secondary | ICD-10-CM

## 2011-11-24 LAB — POCT URINALYSIS DIPSTICK
Spec Grav, UA: 1.015
Urobilinogen, UA: 0.2

## 2011-11-24 MED ORDER — CIPROFLOXACIN HCL 500 MG PO TABS: 500.0000 mg | ORAL_TABLET | Freq: Two times a day (BID) | ORAL | Status: AC

## 2011-11-24 NOTE — Telephone Encounter (Signed)
Have her heard her clonidine for one week. I want to see if she feels better if her blood pressure go up just a little bit. Also sent in an antibiotic for a urinary tract infection. We will call her with the final culture in 3-4 days.

## 2011-11-24 NOTE — Progress Notes (Addendum)
Subjective:    Patient ID: [name], female    DOB: 01-12-1928, 77 y.o.   MRN: [id number]  HPI Says for the lst 2 weeks has felt very fatigued.  Ankles have been more swollen than usual.. No fever or recent colds. No SOB.  No diarrhea.  Went active she gets SOB.  No chest pain or tightness. Last time has pacer checked it was nl. Occ checks BP at home but not lately. Sugars have been slightly elevated but she says this is not new for her. Her pattern has not changed. She denies any hypoglycemic events.. Has had nocturia for several months but saw the renal doc abut 2 months ago. She's pretty sure that she was tested for urinary tract infection was negative. She says her memory has been worse. She also notes that she has recently decreased her Lasix. She skipped several doses because she felt she was urinating more frequently over the last 2 months. She has a history of atrial fibrillation and diabetes. None of her medications are new.   Review of Systems BP 115/60  Pulse 88  Wt 193 lb (87.544 kg)    Allergies  Allergen Reactions  . Ace Inhibitors     REACTION: cough  . Fosinopril Sodium     REACTION: Cough  . Sulfamethoxazole W/Trimethoprim     REACTION: Unspecified    Past Medical History  Diagnosis Date  . History of kidney stones   . History of colonic polyps   . Atrial fibrillation 1/11     Dr Allyson Sabal  . Ulcer     Gastric  . Esophageal stricture   . Hiatal hernia   . Diarrhea   . Memory loss   . Heart failure     chronic diatolic- 2 D echo 1/08 EF 60%  . Microalbuminuria   . CAD (coronary artery disease)   . Atherosclerosis   . Anemia     Pernicious  . Diabetes mellitus     w/neuro manifestations, type II  . Chronic kidney disease     chronic, stage III  . Cystitis   . Osteoarthrosis involving multiple sites   . Swelling     limb  . Back pain   . Peptic ulcer     unspec. w/o obstruction  . Senile osteoporosis   . IBS (irritable bowel syndrome)   .  Hypertension     Past Surgical History  Procedure Date  . Appendectomy   . Egd- esophageal stricture     gastric ulcer  . 2 d echo 1-08    LVEF 55-60%   . Abdnml lv relaxation, mild avr, rv mildly dilated   . Carotid dopplers- no significant stenosis 2007  . Tonsillectomy and adenoidectomy   . Moderate calcification of the right coronary and left anterior descending arteries. on ct scan 4-08  . Normal heart cath 2/10    Dr Allyson Sabal  . Cataract extraction     both eyes  . Pacemaker placement 2/11    History   Social History  . Marital Status: Widowed    Spouse Name: N/A    Number of Children: N/A  . Years of Education: N/A   Occupational History  . Not on file.   Social History Main Topics  . Smoking status: Unknown If Ever Smoked  . Smokeless tobacco: Not on file  . Alcohol Use: No  . Drug Use:   . Sexually Active:    Other Topics Concern  . Not on file  Social History Narrative  . No narrative on file    Family History  Problem Relation Age of Onset  . Diabetes Mother   . Cancer Father     prostate  . Cancer Sister     colon        Objective:   Physical Exam  Constitutional: She is oriented to person, place, and time. She appears well-developed and well-nourished.  HENT:  Head: Normocephalic and atraumatic.  Right Ear: External ear normal.  Left Ear: External ear normal.  Nose: Nose normal.  Mouth/Throat: Oropharynx is clear and moist.       TMs and canals are clear.  Eyes: Conjunctivae are normal. Pupils are equal, round, and reactive to light.  Neck: Neck supple. No thyromegaly present.  Cardiovascular: Normal rate, regular rhythm and normal heart sounds.        No carotid bruits. She is not in A. fib today.  Pulmonary/Chest: Effort normal and breath sounds normal.  Abdominal: Soft. Bowel sounds are normal. She exhibits no distension and no mass. There is no tenderness. There is no rebound and no guarding.  Musculoskeletal: She exhibits no  edema.       1+ bilateral ankle edema.  Lymphadenopathy:    She has no cervical adenopathy.  Neurological: She is alert and oriented to person, place, and time.  Skin: Skin is warm and dry.  Psychiatric: She has a normal mood and affect. Her behavior is normal.          Assessment & Plan:  Fatigue - Will check TSH.  She recently had lab work 4 weeks ago that included a normal CBC, CMP, vitamin D, thyroid panel. I did let her daughter noted her vitamin D was just borderline low at 30. I recommended starting a supplement of 1000 units once a day. Also her blood pressures running a little low today. I wonder if this could be exacerbating her dizziness. She may need to run a slightly higher blood pressure for perfusion.  I would like for her to hold her clonidine for one week and then come back to recheck her blood pressure. Like to see if this actually makes her feel little bit better and that her dizziness is better.  Ankle edema-I suspect this is secondary to not taking her Lasix. I explained this is difficult because without the Lasix she will get increased edema at the same time and can understand her frustration increased urinary frequency to me. I also encouraged her to make sure she is eating a very low-salt diet as this will help.  Urinary frequency-she did have trace blood on her urine today. We will send for culture to confirm. This could explain her frequency as well as her recent fatigue. I am going to ahead and treat her with Cipro 500 mg twice a day for 3 days.  DM- Stable, Acceptable since 77 yo. Continue regimen. Cont to work on [employer].  Lab Results  Component Value Date   HGBA1C 7.5 11/24/2011

## 2011-11-24 NOTE — Telephone Encounter (Signed)
[name] notified.

## 2011-11-25 ENCOUNTER — Other Ambulatory Visit: Payer: Self-pay | Admitting: *Deleted

## 2011-11-25 LAB — CBC WITH DIFFERENTIAL/PLATELET
Basophils Absolute: 0.1 10*3/uL (ref 0.0–0.1)
Eosinophils Absolute: 0.1 10*3/uL (ref 0.0–0.7)
Eosinophils Relative: 2 % (ref 0–5)
Lymphs Abs: 1.4 10*3/uL (ref 0.7–4.0)
MCH: 30.7 pg (ref 26.0–34.0)
MCV: 100.3 fL — ABNORMAL HIGH (ref 78.0–100.0)
Platelets: 296 10*3/uL (ref 150–400)
RDW: 13.7 % (ref 11.5–15.5)

## 2011-11-25 MED ORDER — MECLIZINE HCL 25 MG PO TABS: 25.0000 mg | ORAL_TABLET | Freq: Three times a day (TID) | ORAL | Status: DC | PRN

## 2011-11-25 MED ORDER — TEMAZEPAM 15 MG PO CAPS: 15.0000 mg | ORAL_CAPSULE | Freq: Every evening | ORAL | Status: DC | PRN

## 2011-11-29 LAB — FOLATE: Folate: 3.5 ng/mL

## 2011-11-29 NOTE — Progress Notes (Signed)
Summary: LOW ENERGY,SLURRED SPEECH, SUGAR LEVELS UP & DWN...WSE   Vital Signs:  Patient Profile:   77 Years Old Female CC:      blood sugar issues, possible depression x 2wks Height:     68 inches Weight:      193 pounds O2 Sat:      96 % O2 treatment:    Room Air Temp:     98.5 degrees F oral Pulse rate:   84 / minute Pulse (ortho):   70 / minute Resp:     18 per minute BP sitting:   107 / 65  (left arm) BP standing:   116 / 73 Cuff size:   regular  Vitals Entered By: Clemens Catholic LPN (October 19, 2011 6:34 PM)             CBG Result 170     Serial Vital Signs/Assessments:  Time      Position  BP       Pulse  Resp  Temp     By 8:06 PM   Lying LA  127/76   64                    Christy Locklear LPN 9:14 PM   Sitting   113/69   63                    Christy Locklear LPN 7:82 PM   Standing  116/73   70                    Christy Locklear LPN   Updated Prior Medication List: ASPIRIN 81 MG TBEC (ASPIRIN) Take 1 tablet by mouth once a day OYSTERCAL 500 + D 500-125 MG-UNIT TABS (CALCIUM CARBONATE-VITAMIN D) one by mouth twice daily with meals LANTUS SOLOSTAR 100 UNIT/ML SOLN (INSULIN GLARGINE) 40  units [place] injection qHS METOPROLOL SUCCINATE 25 MG XR24H-TAB (METOPROLOL SUCCINATE) 1 tab by mouth daily NOVOLOG FLEXPEN  SOLN (INSULIN ASPART SOLN) 5 units subcutaneous with breakfast, 6 units subcutaneous with lunch, and  8 units subcutaneous with dinner LASIX 40 MG TABS (FUROSEMIDE) 1/2  tab by mouth daily PRAVASTATIN SODIUM 40 MG TABS (PRAVASTATIN SODIUM) 1 tab by mouth qhs ULTICARE PEN NEEDLES 29G X  MISC (INSULIN PEN NEEDLE) use as directed 4 x a day EASY COMFORT INSULIN SYRINGE 30G X 5/16" 0.5 ML  MISC (INSULIN SYRINGE-NEEDLE U-100) use as directed RESTORIL 15 MG CAPS (TEMAZEPAM) 1 tab by mouth at bedtime as needed sleep AZOR 10-40 MG TABS (AMLODIPINE-OLMESARTAN) 1 tab by mouth daily PANTOPRAZOLE SODIUM 40 MG TBEC (PANTOPRAZOLE SODIUM) Take 1 tab by mouth once  daily CLONIDINE HCL 0.1 MG TABS (CLONIDINE HCL) 1 tab by mouth q 12 hrs GABAPENTIN 600 MG TABS (GABAPENTIN) 1 tab by mouth three times a day NITRO-DUR 0.4 MG/HR PT24 (NITROGLYCERIN) 1 tab by mouth q 5 min x 3 as needed chest pain LEVOTHROID 100 MCG TABS (LEVOTHYROXINE SODIUM) 1 tab by mouth daily COUMADIN 5 MG TABS (WARFARIN SODIUM) Take 1 tab by mouth once daily as directed GABAPENTIN 300 MG CAPS (GABAPENTIN) 1 capsule by mouth qhs NYSTATIN-TRIAMCINOLONE 100000-0.1 UNIT/GM-% OINT (NYSTATIN-TRIAMCINOLONE) apply to perineum two times a day for vaginal irritation  Current Allergies (reviewed today): ! ACE INHIBITORS ! MONOPRIL ! BACTRIMHistory of Present Illness Chief Complaint: blood sugar issues, possible depression x 2wks History of Present Illness:  Subjective:  Patient presents with daughter who is concerned that patient has been persistently fatigued for  several months.  Over the past several weeks she has had recurring episodes of slurred speech, often daily, that may last up to 20 minutes.  Guneet admits that she is usually aware when her speech is slurred but she does not have other associated symptoms.  Paizlie has an occasional episode of hypoglycemia, during which she will have slurred speech and weakness but these do not occur very often.  Yesterday evening her glucose decreased to 60 and responded to food intake.  Her FBG is usually 120 to 150. She is assymptomatic at present  She denies palpitations or light-headedness/dizziness.  Today she had a pacemaker check, and results should be available in several days. Daughter is concerned that Latrell may be depressed.  Two days ago during a church service, patient became briefly tearful.  She recalled memories of her deceased husband but states that she does not presently feel down or depressed. She denies chest pain or shortness of breath   No GI or GU symptoms  She takes Coumadin and states that her monthly INR is usually 2.5 to  3.0  REVIEW OF SYSTEMS Constitutional Symptoms       Complains of fatigue.     Denies fever, chills, night sweats, weight loss, and weight gain.  Eyes       Denies change in vision, eye pain, eye discharge, glasses, contact lenses, and eye surgery. Ear/Nose/Throat/Mouth       Complains of hoarseness.      Denies hearing loss/aids, change in hearing, ear pain, ear discharge, dizziness, frequent runny nose, frequent nose bleeds, sinus problems, sore throat, and tooth pain or bleeding.  Respiratory       Denies dry cough, productive cough, wheezing, shortness of breath, asthma, bronchitis, and emphysema/COPD.  Cardiovascular       Denies murmurs, chest pain, and tires easily with exhertion.    Gastrointestinal       Denies stomach pain, nausea/vomiting, diarrhea, constipation, blood in bowel movements, and indigestion. Genitourniary       Denies painful urination, kidney stones, and loss of urinary control. Neurological       Complains of headaches.      Denies paralysis, seizures, and fainting/blackouts. Musculoskeletal       Denies muscle pain, joint pain, joint stiffness, decreased range of motion, redness, swelling, muscle weakness, and gout.  Skin       Denies bruising, unusual mles/lumps or sores, and hair/skin or nail changes.  Psych       Denies mood changes, temper/anger issues, anxiety/stress, speech problems, depression, and sleep problems. Other Comments: pt c/o blood sugar flucuating x 2wks, 60 last night, 287 and 180 today. fatigue, dizzy and sadness.    Past History:  Past Medical History: Reviewed history from 01/23/2010 and no changes required. hx of kidney stones Current Problems:  COLONIC POLYPS, HYPERPLASTIC, HX OF (ICD-V12.72) A. FIB (Dr Allyson Sabal) 12-2009 GASTRIC ULCER, HX OF (ICD-V12.71) Hx of ESOPHAGEAL STRICTURE (ICD-530.3) HIATAL HERNIA (ICD-553.3) Family Hx of COLON CANCER (ICD-153.9) DIARRHEA (ICD-787.91) MEMORY LOSS (ICD-780.93) OTHER CATARACT  (ICD-366.8) CHRONIC DIASTOLIC HEART FAILURE (ICD-428.32) - 2-D echo 1/08 EF 60% MICROALBUMINURIA (ICD-791.0) CAD (ICD-414.00) - normal cath 2/10 by Dr. Allyson Sabal ATHEROSCLEROSIS NOS (ICD-440.9) PERNICIOUS ANEMIA (ICD-281.0) DM W/NEURO MANIFESTATIONS, TYPE II (ICD-250.60) KIDNEY DISEASE, CHRONIC, STAGE III (ICD-585.3) CYSTITIS, CHRONIC (ICD-595.2) OSTEOARTHROSIS, GENERALIZED, MULTIPLE SITES (ICD-715.09) SWELLING, LIMB (ICD-729.81) BACK PAIN (ICD-724.5) PEPTIC ULCER DIS., UNSPEC. W/O OBSTRUCTION (ICD-533.90) SENILE OSTEOPOROSIS (ICD-733.01) IRRITABLE BOWEL SYNDROME (ICD-564.1) HYPERTENSION, BENIGN SYSTEMIC (ICD-401.1)  L foot fx (Dr Margaretha Sheffield)  Past Surgical History: Reviewed history from 03/04/2010 and no changes required. 2D echo- LVEF 55-60% 1-08  abnml LV relaxation, mild AVR, RV mildly dilated  Appendectomy  carotid dopplers- no significant stenosis - 2007  EGD-- esophageal stricture, gastric ulcer  T&A moderate calcification of the right coronary and left anterior descending arteries.  On CT scan 03/2007  normal heart cath 01/2009 Dr Allyson Sabal. Cataract Extraction- Both eyes pacemaker placement Feb 2011  Family History: Reviewed history from 10/29/2008 and no changes required. 9 sibblings died- colon cancer, lung cancer  father died at 33 from AMI  mother died at 15 from DM, AMI Family History of Colon Cancer: Sister Family History of Prostate Cancer: Father Family History of Diabetes: Mother  Social History: Reviewed history from 03/04/2010 and no changes required. Widowed x 21 yrs.  Retired from [employer].  Has 2 daughters- one in [place] and one in [place].  Lives w/ daughter [name] [phone]) in [place] and her son in law who had a stroke.  Nonsmoker.  Denies ETOH. Trying to walk more.  Has 2 living sibblings.   Objective:  Appearance:  Elderly patient appears generally healthy, stated age, and in no acute distress.  Speech is normal.  She is alert and oriented.  Euthymic mood  and affect; good eye contact.  Thoughts are organized.  No suicidal ideation Eyes:  Pupils are equal, round, and reactive to light and accomodation.  Extraocular movement is intact.  Conjunctivae are not inflamed.  Fundi benign Nose:  Minimal congestion Pharynx:  Normal  Neck:  Supple.  No adenopathy is present.  No thyromegaly is present.  Carotids have normal upstrokes without bruits Lungs:  Clear to auscultation.  Breath sounds are equal.  Heart:  Regular rate and rhythm without murmurs, rubs, or gallops.  Rate 72 Extremities: Trace edema lower legs Skin:  No rash Neurologic:  Cranial nerves 2 through 12 are normal.  Patellar and elbow reflexes are normal.  Cerebellar function is intact (finger to nose).  Gait and station are normal.   CBC:  WBC 8.4 ; LY 23.6, MO 7.0, GR 69.4; Hgb 13.0  Random blood glucose 170 No significnat orthostatic changes in BP and pulse sitting, standing, and supine   Assessment  Assessed FATIGUE, CHRONIC as deteriorated - Donna Christen MD New Problems: DYSARTHRIA [phone])  PATIENT'S CHART REVIEWED.  NO ACUTE FINDINGS ON HER EXAM TODAY TO EXPLAIN EXACERBATION OF HER CHRONIC FATIGUE, AND INTERMITTENT DYSARTHRIA.  ? TIA'S.  NO DYSARTHRIA PRESENT ON EXAM.  PATIENT DOES NOT APPEAR DEPRESSED  Plan New Orders: CBC w/Diff [[id number]] T-CMP with estimated GFR [[address]] T-TSH [[id number]] T-T3, Free [20254-27062] T- Hemoglobin A1C [83036-23375] T-Vitamin D (25-Hydroxy) [id number] New Patient Level V [99205] Services provided After hours-Weekends-Holidays [99051] Planning Comments:   Pacemaker evaluation pending. Will repeat routine lab monitoring now due:  Hgb A1c, TSH, free T3, CMP Do not see a recent 25 hydroxy Vit D; will obtain that also. Recommend that she follow-up with her PCP in about 6 or 7 days to evaluate all new lab data and re-evaluate.  ? schedule MRI brain; check carotid arteries, ? repeat echocardiogram If symptoms become  significantly worse during the night or over the weekend, proceed to the local emergency room.   The patient and/or caregiver has been counseled thoroughly with regard to medications prescribed including dosage, schedule, interactions, rationale for use, and possible side effects and they verbalize understanding.  Diagnoses and expected course of recovery discussed and will return if not improved as expected or  if the condition worsens. Patient and/or caregiver verbalized understanding.   Orders Added: 1)  CBC w/Diff [[id number]] 2)  T-CMP with estimated GFR [[address]] 3)  T-TSH [13244-01027] 4)  T-T3, Free [25366-44034] 5)  T- Hemoglobin A1C [83036-23375] 6)  T-Vitamin D (25-Hydroxy) [74259-56387] 7)  New Patient Level V [99205] 8)  Services provided After hours-Weekends-Holidays [99051]    Laboratory Results   Blood Tests   Date/Time Received: October 19, 2011 6:55 PM  Date/Time Reported: October 19, 2011 6:55 PM   CBG Random:: 170mg /dL  Comments: ate lunch @ 12:30 Date/Time Received: October 19, 2011 6:55 PM  Date/Time Reported: October 19, 2011 6:55 PM

## 2011-11-30 LAB — URINE CULTURE: Colony Count: 30000

## 2011-12-03 ENCOUNTER — Ambulatory Visit: Payer: Medicare Other | Admitting: Internal Medicine

## 2011-12-08 ENCOUNTER — Ambulatory Visit (INDEPENDENT_AMBULATORY_CARE_PROVIDER_SITE_OTHER): Payer: Medicare Other | Admitting: Family Medicine

## 2011-12-08 ENCOUNTER — Encounter: Payer: Self-pay | Admitting: Family Medicine

## 2011-12-08 ENCOUNTER — Telehealth: Payer: Self-pay | Admitting: *Deleted

## 2011-12-08 DIAGNOSIS — I10 Essential (primary) hypertension: Secondary | ICD-10-CM

## 2011-12-08 DIAGNOSIS — Z0000 Encounter for general adult medical examination without abnormal findings: Secondary | ICD-10-CM

## 2011-12-08 MED ORDER — AMLODIPINE BESYLATE 5 MG PO TABS: 5.0000 mg | ORAL_TABLET | Freq: Every day | ORAL | Status: DC

## 2011-12-08 MED ORDER — LOSARTAN POTASSIUM 100 MG PO TABS: 100.0000 mg | ORAL_TABLET | Freq: Every day | ORAL | Status: DC

## 2011-12-08 MED ORDER — AMBULATORY NON FORMULARY MEDICATION: Status: DC

## 2011-12-08 NOTE — Telephone Encounter (Signed)
She can hold all pills and then we can recheck her pressure in 3 weeks. I already sent rx to her mailorder so she will need to call them and have them hold those or she will get charged.

## 2011-12-08 NOTE — Progress Notes (Signed)
Hypertension-she does feel better after stopping her clotting. She has not restarted. Her blood pressure looks fantastic today. She still gets an occasional lightheaded or dizzy spell especially if she stands or sits quickly. She still feels weak and washed out. We discussed that we can try decreasing her amlodipine to see if this helps. I gave her samples of a 05/40. She is also very concerned about the cost of the Azor which was over $100. We will send a prescription to her mail order for amlodipine 5 mg and losartan 100 mg in its place. In the meantime she can take the Azor 5/40 until she receives her mail order supply. Followup in 6-8 weeks to recheck her blood pressure on the new medication.

## 2011-12-08 NOTE — Progress Notes (Signed)
Subjective:    [name] is a 77 y.o. female who presents for Medicare Annual/Subsequent preventive examination.  Preventive Screening-Counseling & Management  Tobacco History  Smoking status  . Unknown If Ever Smoked  Smokeless tobacco  . Not on file     Problems Prior to Visit 1.   Current Problems (verified) Patient Active Problem List  Diagnoses  . UNSPECIFIED HYPOTHYROIDISM  . DM W/NEURO MANIFESTATIONS, TYPE II  . PERNICIOUS ANEMIA  . OTHER CATARACT  . TRICUSPID REGURGITATION  . HYPERTENSION, BENIGN SYSTEMIC  . CAD  . HYPERTENSION, PULMONARY  . RIGHT BUNDLE BRANCH BLOCK  . ATRIAL FIBRILLATION  . CHRONIC DIASTOLIC HEART FAILURE  . ATHEROSCLEROSIS NOS  . VENOUS STASIS ULCER  . ESOPHAGEAL STRICTURE  . PEPTIC ULCER DIS., UNSPEC. W/O OBSTRUCTION  . HIATAL HERNIA  . IRRITABLE BOWEL SYNDROME  . PANCREATITIS  . KIDNEY DISEASE, CHRONIC, STAGE III  . CYSTITIS, CHRONIC  . COLLAGENOUS COLITIS  . OSTEOARTHROSIS, GENERALIZED, MULTIPLE SITES  . BACK PAIN  . SWELLING, LIMB  . LEG CRAMPS, NOCTURNAL  . SENILE OSTEOPOROSIS  . SYNCOPE  . MEMORY LOSS  . HYPERGLYCEMIA  . MICROALBUMINURIA  . FRACTURE, FOOT  . KNEE SPRAIN  . GASTRIC ULCER, HX OF  . COLONIC POLYPS, HYPERPLASTIC, HX OF  . Cervicalgia  . CEPHALGIA  . Vertigo  . Pacemaker  . DYSARTHRIA    Medications Prior to Visit Current Outpatient Prescriptions on File Prior to Visit  Medication Sig Dispense Refill  . amLODipine-olmesartan (AZOR) 10-40 MG per tablet Take 1 tablet by mouth daily.  14 tablet  0  . aspirin 81 MG tablet Take 81 mg by mouth as needed.       . Calcium Carbonate-Vitamin D 500-125 MG-UNIT TABS Take 1 tablet by mouth 2 (two) times daily with a meal.        . cloNIDine (CATAPRES) 0.1 MG tablet Take 1 tablet (0.1 mg total) by mouth every 12 (twelve) hours.  180 tablet  1  . furosemide (LASIX) 40 MG tablet Take 40 mg by mouth daily. 1/2 tab daily       . gabapentin (NEURONTIN) 300 MG  capsule Take 300 mg by mouth at bedtime.        . gabapentin (NEURONTIN) 600 MG tablet Take 600 mg by mouth 3 (three) times daily.        . insulin aspart (NOVOLOG FLEXPEN) 100 UNIT/ML injection Inject 8 Units into the skin 3 (three) times daily before meals.  24 mL  1  . insulin glargine (LANTUS SOLOSTAR) 100 UNIT/ML injection Inject 18 Units into the skin 2 (two) times daily.  10 mL  3  . Insulin Pen Needle (PEN NEEDLES) 29G X MISC by Does not apply route 4 (four) times daily. As directed       . Insulin Syringe-Needle U-100 (EASY COMFORT INSULIN SYRINGE) 30G X 5/16" 0.5 ML MISC by Does not apply route as directed.        Marland Kitchen levothyroxine (SYNTHROID, LEVOTHROID) 100 MCG tablet Take 1 tablet (100 mcg total) by mouth daily.  90 tablet  1  . meclizine (ANTIVERT) 25 MG tablet Take 1 tablet (25 mg total) by mouth 3 (three) times daily as needed for dizziness or nausea.  30 tablet  0  . nystatin (MYCOSTATIN) ointment Apply topically 2 (two) times daily. Apply to perineum bid for vaginal irritation       . nystatin-triamcinolone (MYCOLOG) ointment Apply topically 2 (two) times daily.  30 g  0  . pantoprazole (PROTONIX) 40 MG tablet Take 1 tablet (40 mg total) by mouth daily.  90 tablet  1  . pravastatin (PRAVACHOL) 40 MG tablet Take 1 tablet (40 mg total) by mouth at bedtime.  90 tablet  1  . temazepam (RESTORIL) 15 MG capsule Take 1 capsule (15 mg total) by mouth at bedtime as needed. For sleep  90 capsule  0  . warfarin (COUMADIN) 5 MG tablet Take 1 tablet (5 mg total) by mouth daily.  90 tablet  1  . HYDROcodone-acetaminophen (NORCO) 5-325 MG per tablet Take 1 tablet by mouth every 6 (six) hours as needed. Take with food  40 tablet  0  . metoprolol succinate (TOPROL-XL) 25 MG 24 hr tablet Take 25 mg by mouth daily.        . Oxycodone-Acetaminophen (PERCOCET PO) Take by mouth.         Current Facility-Administered Medications on File Prior to Visit  Medication Dose Route Frequency Provider Last  Rate Last Dose  . cyanocobalamin ((VITAMIN B-12)) injection 1,000 mcg  1,000 mcg Intramuscular Q30 days Seymour Bars, DO   1,000 mcg at 06/16/11 1136    Current Medications (verified) Current Outpatient Prescriptions  Medication Sig Dispense Refill  . amLODipine-olmesartan (AZOR) 10-40 MG per tablet Take 1 tablet by mouth daily.  14 tablet  0  . aspirin 81 MG tablet Take 81 mg by mouth as needed.       . Calcium Carbonate-Vitamin D 500-125 MG-UNIT TABS Take 1 tablet by mouth 2 (two) times daily with a meal.        . cloNIDine (CATAPRES) 0.1 MG tablet Take 1 tablet (0.1 mg total) by mouth every 12 (twelve) hours.  180 tablet  1  . furosemide (LASIX) 40 MG tablet Take 40 mg by mouth daily. 1/2 tab daily       . gabapentin (NEURONTIN) 300 MG capsule Take 300 mg by mouth at bedtime.        . gabapentin (NEURONTIN) 600 MG tablet Take 600 mg by mouth 3 (three) times daily.        . insulin aspart (NOVOLOG FLEXPEN) 100 UNIT/ML injection Inject 8 Units into the skin 3 (three) times daily before meals.  24 mL  1  . insulin glargine (LANTUS SOLOSTAR) 100 UNIT/ML injection Inject 18 Units into the skin 2 (two) times daily.  10 mL  3  . Insulin Pen Needle (PEN NEEDLES) 29G X MISC by Does not apply route 4 (four) times daily. As directed       . Insulin Syringe-Needle U-100 (EASY COMFORT INSULIN SYRINGE) 30G X 5/16" 0.5 ML MISC by Does not apply route as directed.        Marland Kitchen levothyroxine (SYNTHROID, LEVOTHROID) 100 MCG tablet Take 1 tablet (100 mcg total) by mouth daily.  90 tablet  1  . meclizine (ANTIVERT) 25 MG tablet Take 1 tablet (25 mg total) by mouth 3 (three) times daily as needed for dizziness or nausea.  30 tablet  0  . nystatin (MYCOSTATIN) ointment Apply topically 2 (two) times daily. Apply to perineum bid for vaginal irritation       . nystatin-triamcinolone (MYCOLOG) ointment Apply topically 2 (two) times daily.  30 g  0  . pantoprazole (PROTONIX) 40 MG tablet Take 1 tablet (40 mg total) by  mouth daily.  90 tablet  1  . pravastatin (PRAVACHOL) 40 MG tablet Take 1 tablet (40 mg total) by mouth at bedtime.  90  tablet  1  . temazepam (RESTORIL) 15 MG capsule Take 1 capsule (15 mg total) by mouth at bedtime as needed. For sleep  90 capsule  0  . warfarin (COUMADIN) 5 MG tablet Take 1 tablet (5 mg total) by mouth daily.  90 tablet  1  . HYDROcodone-acetaminophen (NORCO) 5-325 MG per tablet Take 1 tablet by mouth every 6 (six) hours as needed. Take with food  40 tablet  0  . metoprolol succinate (TOPROL-XL) 25 MG 24 hr tablet Take 25 mg by mouth daily.        . Oxycodone-Acetaminophen (PERCOCET PO) Take by mouth.         Current Facility-Administered Medications  Medication Dose Route Frequency Provider Last Rate Last Dose  . cyanocobalamin ((VITAMIN B-12)) injection 1,000 mcg  1,000 mcg Intramuscular Q30 days Seymour Bars, DO   1,000 mcg at 06/16/11 1136     Allergies (verified) Ace inhibitors; Fosinopril sodium; and Sulfamethoxazole w/trimethoprim   PAST HISTORY  Family History Family History  Problem Relation Age of Onset  . Diabetes Mother   . Cancer Father     prostate  . Cancer Sister     colon    Social History History  Substance Use Topics  . Smoking status: Unknown If Ever Smoked  . Smokeless tobacco: Not on file  . Alcohol Use: No     Are there smokers in your home (other than you)? No  Risk Factors Current exercise habits: The patient does not participate in regular exercise at present.  Dietary issues discussed: healthy diet.    Cardiac risk factors: advanced age (older than 59 for men, 3 for women), diabetes mellitus, family history of premature cardiovascular disease, hypertension and sedentary lifestyle.  Depression Screen (Note: if answer to either of the following is "Yes", a more complete depression screening is indicated)   Over the past two weeks, have you felt down, depressed or hopeless? No  Over the past two weeks, have you felt little  interest or pleasure in doing things? No  Have you lost interest or pleasure in daily life? No  Do you often feel hopeless? No  Do you cry easily over simple problems? Yes  Activities of Daily Living In your present state of health, do you have any difficulty performing the following activities?:  Driving? No Managing money?  No Feeding yourself? No Getting from bed to chair? No Climbing a flight of stairs? No Preparing food and eating?: No Bathing or showering? No Getting dressed: No Getting to the toilet? No Using the toilet:No Moving around from place to place: No In the past year have you fallen or had a near fall?:Yes, fell out of bed twice. Has bruise right now on her right axilla and right shoulder blade form a fall last week. No LOC   Are you sexually active?  No  Do you have more than one partner?  No  Hearing Difficulties: No Do you often ask people to speak up or repeat themselves? No Do you experience ringing or noises in your ears? No Do you have difficulty understanding soft or whispered voices? Yes   Do you feel that you have a problem with memory? Yes. Sometimes, more recently.     Do you often misplace items? No  Do you feel safe at home?  Yes  Cognitive Testing  Alert? Yes  Normal Appearance?Yes  Oriented to person? Yes  Place? Yes   Time? Yes  Recall of three objects?  Yes  Can perform simple calculations? Yes  Displays appropriate judgment?Yes  Can read the correct time from a watch face?Yes   Advanced Directives have been discussed with the patient? No  List the Names of Other Physician/Practitioners you currently use: 1.    Indicate any recent Medical Services you may have received from other than [facility] providers in the past year (date may be approximate).  Immunization History  Administered Date(s) Administered  . H1N1 01/22/2009  . Influenza Split 09/17/2011  . Influenza Whole 10/03/2006, 10/30/2007, 10/02/2008, 12/03/2009, 10/07/2010  .  Pneumococcal Polysaccharide 02/25/1996  . Td 10/03/2006    Screening Tests Health Maintenance  Topic Date Due  . Zostavax  01/21/1990  . Influenza Vaccine  09/26/2012  . Tetanus/tdap  10/03/2016  . Colonoscopy  12/04/2018  . Pneumococcal Polysaccharide Vaccine Age 90 And Over  Completed    All answers were reviewed with the patient and necessary referrals were made:  Kahley Leib, MD   12/08/2011   History reviewed: allergies, current medications, past family history, past medical history, past surgical history and problem list  Review of Systems A comprehensive review of systems was negative.    Objective:     Vision by Snellen chart: right eye:20/20, left eye:20/40  Body mass index is 29.60 kg/(m^2). BP 124/63  Pulse 93  Ht 5\' 7"  (1.702 m)  Wt 189 lb (85.73 kg)  BMI 29.60 kg/m2  BP 124/63  Pulse 93  Ht 5\' 7"  (1.702 m)  Wt 189 lb (85.73 kg)  BMI 29.60 kg/m2  General Appearance:    Alert, cooperative, no distress, appears stated age  Head:    Normocephalic, without obvious abnormality, atraumatic  Eyes:    PERRL, conjunctiva/corneas clear, EOM's intact, both eyes  Ears:    Normal TM's and external ear canals, both ears  Nose:   Nares normal, septum midline, mucosa normal, no drainage    or sinus tenderness  Throat:   Lips, mucosa, and tongue normal; teeth and gums normal  Neck:   Supple, symmetrical, trachea midline, no adenopathy;    thyroid:  no enlargement/tenderness/nodules; no carotid   bruit or JVD  Back:     Symmetric, no curvature, ROM normal, no CVA tenderness  Lungs:     Clear to auscultation bilaterally, respirations unlabored  Chest Wall:    No tenderness or deformity   Heart:    Regular rate and rhythm, S1 and S2 normal, no murmur, rub   or gallop  Breast Exam:    No tenderness, masses, or nipple abnormality  Abdomen:     Soft, non-tender, bowel sounds active all four quadrants,    no masses, no organomegaly  Genitalia:    Not examined  Rectal:     Not examined  Extremities:   Extremities normal, atraumatic, no cyanosis or edema  Pulses:   2+ and symmetric all extremities  Skin:   Skin color, texture, turgor normal, no rashes or lesions  Lymph nodes:   Cervical, supraclavicular  nodes normal  Neurologic:   CNII-XII intact, normal strength, reflexes    throughout       Assessment:     Annual Medicare Wellness.      Plan:     During the course of the visit the patient was educated and counseled about appropriate screening and preventive services including:    Screening electrocardiogram  Nutrition counseling   Advanced directives: has NO advanced directive  - add't info requested. Referral to SW: not applicable  Shingles vaccine.  We discussed  some dietary changes today.   EKG shows rate of 65 beats per minute with pacer. She also has right bundle branch block.  Patient Instructions (the written plan) was given to the patient.  Medicare Attestation I have personally reviewed: The patient's medical and social history Their use of alcohol, tobacco or illicit drugs Their current medications and supplements The patient's functional ability including ADLs,fall risks, home safety risks, cognitive, and hearing and visual impairment Diet and physical activities Evidence for depression or mood disorders  The patient's weight, height, BMI, and visual acuity have been recorded in the chart.  I have made referrals, counseling, and provided education to the patient based on review of the above and I have provided the patient with a written personalized care plan for preventive services.     Alane Hanssen, MD   12/08/2011

## 2011-12-08 NOTE — Patient Instructions (Addendum)
Change to Azor 5/10mg  daily. I sent 2 new prescriptions to [facility] for amlodipine 5mg  and losartan 100mg .  Start a regular exercise program and make sure you are eating a healthy diet Try to eat 4 servings of dairy a day or take a calcium supplement (500mg  twice a day). Your vaccines are up to date. Given rx to get the shingles vaccine at th pharmacy.

## 2011-12-08 NOTE — Telephone Encounter (Signed)
FYI: as I was letting this pt go today she states that she did not take any BP pills today and her BP was nml

## 2011-12-09 NOTE — Telephone Encounter (Signed)
Pt.notified

## 2011-12-29 ENCOUNTER — Emergency Department
Admission: EM | Admit: 2011-12-29 | Discharge: 2011-12-29 | Payer: Medicare Other | Source: Home / Self Care | Attending: Family Medicine | Admitting: Family Medicine

## 2011-12-29 DIAGNOSIS — M79661 Pain in right lower leg: Secondary | ICD-10-CM

## 2011-12-29 DIAGNOSIS — R0609 Other forms of dyspnea: Secondary | ICD-10-CM

## 2011-12-29 DIAGNOSIS — M79609 Pain in unspecified limb: Secondary | ICD-10-CM

## 2011-12-29 NOTE — ED Provider Notes (Signed)
History     CSN: [id number]  Arrival date & time 12/29/11  1717   First MD Initiated Contact with Patient 12/29/11 1901      Chief Complaint  Patient presents with  . Cough  . Shortness of Breath      HPI Comments: Patient complains of developing flu like symptoms about 9 days ago with myalgias, sore throat, cough, and fatigue.  The cough is non productive and worse at night.  Over the past several days she has developed a burning sensation in the anterior chest, chills/fever, and shortness of breath with activity.  She also complains of increasing numbness in her lower legs and bilateral calf pain.  Patient is a 78 y.o. female presenting with cough and shortness of breath. The history is provided by the patient and a relative.  Cough This is a new problem. Episode onset: 9 days ago. The problem occurs every few minutes. The problem has been gradually worsening. The cough is non-productive. There has been no fever. Associated symptoms include chest pain, chills, headaches, rhinorrhea, sore throat, myalgias, shortness of breath and wheezing. Pertinent negatives include no sweats, no ear congestion, no ear pain and no eye redness. She has tried cough syrup for the symptoms. She is not a smoker.  Shortness of Breath  Associated symptoms include chest pain, rhinorrhea, sore throat, cough, shortness of breath and wheezing.    Past Medical History  Diagnosis Date  . History of kidney stones   . History of colonic polyps   . Campath-induced atrial fibrillation 1/11     Dr Allyson Sabal  . Ulcer     Gastric  . Esophageal stricture   . Hiatal hernia   . Diarrhea   . Memory loss   . Heart failure     chronic diatolic- 2 D echo 1/08 EF 60%  . Microalbuminuria   . CAD (coronary artery disease)   . Atherosclerosis   . Anemia     Pernicious  . Diabetes mellitus     w/neuro manifestations, type II  . Chronic kidney disease     chronic, stage III  . Cystitis   . Osteoarthrosis involving  multiple sites   . Swelling     limb  . Back pain   . Peptic ulcer     unspec. w/o obstruction  . Senile osteoporosis   . IBS (irritable bowel syndrome)   . Hypertension     Past Surgical History  Procedure Date  . Appendectomy   . Egd- esophageal stricture     gastric ulcer  . 2 d echo 1-08    LVEF 55-60%   . Abdnml lv relaxation, mild avr, rv mildly dilated   . Carotid dopplers- no significant stenosis 2007  . Tonsillectomy and adenoidectomy   . Moderate calcification of the right coronary and left anterior descending arteries. on ct scan 4-08  . Normal heart cath 2/10    Dr Allyson Sabal  . Cataract extraction     both eyes  . Pacemaker placement 2/11    Family History  Problem Relation Age of Onset  . Diabetes Mother   . Cancer Father     prostate  . Cancer Sister     colon    History  Substance Use Topics  . Smoking status: Never Smoker   . Smokeless tobacco: Not on file  . Alcohol Use: No    OB History    Grav Para Term Preterm Abortions TAB SAB Ect Mult Living  Review of Systems  Constitutional: Positive for chills and fatigue.  HENT: Positive for sore throat and rhinorrhea. Negative for ear pain.   Eyes: Negative for redness.  Respiratory: Positive for cough, chest tightness, shortness of breath and wheezing.   Cardiovascular: Positive for chest pain and leg swelling.  Gastrointestinal: Positive for nausea. Negative for vomiting.  Genitourinary: Negative.   Musculoskeletal: Positive for myalgias.  Skin: Negative.   Neurological: Positive for headaches.    Allergies  Ace inhibitors; Fosinopril sodium; and Sulfamethoxazole w/trimethoprim  Home Medications   Current Outpatient Rx  Name Route Sig Dispense Refill  . AMBULATORY NON FORMULARY MEDICATION  Medication Name: Zostvax IM x 1 1 vial 0  . AMLODIPINE BESYLATE 5 MG PO TABS Oral Take 1 tablet (5 mg total) by mouth daily. 90 tablet 1  . ASPIRIN 81 MG PO TABS Oral Take 81 mg by  mouth as needed.     Marland Kitchen CALCIUM CARBONATE-VITAMIN D 500-125 MG-UNIT PO TABS Oral Take 1 tablet by mouth 2 (two) times daily with a meal.      . FUROSEMIDE 40 MG PO TABS Oral Take 40 mg by mouth daily. 1/2 tab daily     . GABAPENTIN 300 MG PO CAPS Oral Take 300 mg by mouth at bedtime.      Marland Kitchen GABAPENTIN 600 MG PO TABS Oral Take 600 mg by mouth 3 (three) times daily.      Marland Kitchen HYDROCODONE-ACETAMINOPHEN 5-325 MG PO TABS Oral Take 1 tablet by mouth every 6 (six) hours as needed. Take with food 40 tablet 0  . INSULIN ASPART 100 UNIT/ML [place] SOLN Subcutaneous Inject 8 Units into the skin 3 (three) times daily before meals. 24 mL 1  . INSULIN GLARGINE 100 UNIT/ML  SOLN Subcutaneous Inject 18 Units into the skin 2 (two) times daily. 10 mL 3  . PEN NEEDLES 29G X MISC Does not apply by Does not apply route 4 (four) times daily. As directed     . INSULIN SYRINGE-NEEDLE U-100 30G X 5/16" 0.5 ML MISC Does not apply by Does not apply route as directed.      Marland Kitchen LEVOTHYROXINE SODIUM 100 MCG PO TABS Oral Take 1 tablet (100 mcg total) by mouth daily. 90 tablet 1  . LOSARTAN POTASSIUM 100 MG PO TABS Oral Take 1 tablet (100 mg total) by mouth daily. 90 tablet 1  . MECLIZINE HCL 25 MG PO TABS Oral Take 1 tablet (25 mg total) by mouth 3 (three) times daily as needed for dizziness or nausea. 30 tablet 0  . METOPROLOL SUCCINATE ER 25 MG PO TB24 Oral Take 25 mg by mouth daily.      . NYSTATIN 100000 UNIT/GM EX OINT Topical Apply topically 2 (two) times daily. Apply to perineum bid for vaginal irritation     . NYSTATIN-TRIAMCINOLONE 100000-0.1 UNIT/GM-% EX OINT Topical Apply topically 2 (two) times daily. 30 g 0  . PERCOCET PO Oral Take by mouth.      Marland Kitchen PANTOPRAZOLE SODIUM 40 MG PO TBEC Oral Take 1 tablet (40 mg total) by mouth daily. 90 tablet 1  . PRAVASTATIN SODIUM 40 MG PO TABS Oral Take 1 tablet (40 mg total) by mouth at bedtime. 90 tablet 1  . TEMAZEPAM 15 MG PO CAPS Oral Take 1 capsule (15 mg total) by mouth at  bedtime as needed. For sleep 90 capsule 0  . WARFARIN SODIUM 5 MG PO TABS Oral Take 1 tablet (5 mg total) by mouth daily. 90 tablet 1  BP 168/71  Pulse 75  Temp(Src) 98.3 F (36.8 C) (Oral)  Resp 28  Ht 5\' 7"  (1.702 m)  Wt 173 lb (78.472 kg)  BMI 27.10 kg/m2  SpO2 89%  Physical Exam Nursing notes and Vital Signs reviewed. Appearance:  Patient appears in no acute distress.  She is alert and oriented. Eyes:  Pupils are equal, round, and reactive to light and accomodation.  Extraocular movement is intact.  Conjunctivae are not inflamed  Ears:  Canals normal.  Tympanic membranes normal.  Nose:  Mildly congested turbinates.  No sinus tenderness.   Pharynx:  Normal; moist mucous membranes  Neck:  Supple.  Slightly tender shotty posterior nodes are palpated bilaterally  Lungs:   Diffuse faint bilateral wheezes. No rales.  Breath sounds are equal.  Chest:  Distinct tenderness to palpation over the mid-sternum.  Heart:  Regular rate and rhythm without murmurs, rubs, or gallops.  Abdomen:  Nontender without masses or hepatosplenomegaly.  Bowel sounds are present.  No CVA or flank tenderness.  Extremities:  2+ pitting edema.  Posterior calves are tender to palpation bilaterally.  Negative Homan's test. Skin:  No rash present.   ED Course  Procedures none      1. Dyspnea on exertion   2. Bilateral calf pain       MDM  With bilateral calf pain and anterior chest tightness, DOE, and decreased pulse ox 89, concerned about possible DVT/PE.   Patient is stable.  Daughter agrees to proceed immediatedly to the [facility] ER for further evaluation.        Donna Christen, MD 12/29/11 2141

## 2011-12-30 ENCOUNTER — Ambulatory Visit (INDEPENDENT_AMBULATORY_CARE_PROVIDER_SITE_OTHER): Payer: Medicare Other | Admitting: Family Medicine

## 2011-12-30 ENCOUNTER — Encounter: Payer: Self-pay | Admitting: Family Medicine

## 2011-12-30 VITALS — BP 161/85 | HR 107 | Temp 98.3°F | Ht 67.0 in | Wt 173.0 lb

## 2011-12-30 DIAGNOSIS — R5383 Other fatigue: Secondary | ICD-10-CM

## 2011-12-30 DIAGNOSIS — J40 Bronchitis, not specified as acute or chronic: Secondary | ICD-10-CM

## 2011-12-30 DIAGNOSIS — R5381 Other malaise: Secondary | ICD-10-CM

## 2011-12-30 DIAGNOSIS — R112 Nausea with vomiting, unspecified: Secondary | ICD-10-CM

## 2011-12-30 DIAGNOSIS — R05 Cough: Secondary | ICD-10-CM

## 2011-12-30 DIAGNOSIS — N390 Urinary tract infection, site not specified: Secondary | ICD-10-CM

## 2011-12-30 MED ORDER — METHYLPREDNISOLONE SODIUM SUCC 125 MG IJ SOLR: 125.0000 mg | Freq: Once | INTRAMUSCULAR | Status: DC

## 2011-12-30 MED ORDER — CEFTRIAXONE SODIUM 1 G IJ SOLR
1.0000 g | Freq: Once | INTRAMUSCULAR | Status: AC
Start: 2011-12-30 — End: 2011-12-30
  Administered 2011-12-30: 1 g via INTRAMUSCULAR

## 2011-12-30 MED ORDER — ALBUTEROL SULFATE (5 MG/ML) 0.5% IN NEBU
2.5000 mg | INHALATION_SOLUTION | Freq: Once | RESPIRATORY_TRACT | Status: AC
  Administered 2011-12-30: 2.5 mg via RESPIRATORY_TRACT

## 2011-12-30 MED ORDER — CEFUROXIME AXETIL 500 MG PO TABS: 500.0000 mg | ORAL_TABLET | Freq: Two times a day (BID) | ORAL | Status: DC

## 2011-12-30 MED ORDER — BENZONATATE 200 MG PO CAPS: 200.0000 mg | ORAL_CAPSULE | Freq: Three times a day (TID) | ORAL | Status: AC | PRN

## 2011-12-30 MED ORDER — ONDANSETRON 8 MG PO TBDP: 8.0000 mg | ORAL_TABLET | Freq: Three times a day (TID) | ORAL | Status: DC | PRN

## 2011-12-30 MED ORDER — BENZONATATE 200 MG PO CAPS: 200.0000 mg | ORAL_CAPSULE | Freq: Three times a day (TID) | ORAL | Status: DC | PRN

## 2011-12-30 MED ORDER — CEFTRIAXONE SODIUM 1 G IJ SOLR: 1.0000 g | Freq: Once | INTRAMUSCULAR | Status: DC

## 2011-12-30 MED ORDER — CEFUROXIME AXETIL 500 MG PO TABS: 500.0000 mg | ORAL_TABLET | Freq: Two times a day (BID) | ORAL | Status: AC

## 2011-12-30 MED ORDER — ONDANSETRON 8 MG PO TBDP: 8.0000 mg | ORAL_TABLET | Freq: Three times a day (TID) | ORAL | Status: AC | PRN

## 2011-12-30 MED ORDER — ONDANSETRON HCL 4 MG PO TABS
4.0000 mg | ORAL_TABLET | Freq: Once | ORAL | Status: AC
  Administered 2011-12-30: 4 mg via ORAL

## 2011-12-30 MED ORDER — SODIUM CHLORIDE 0.9 % IV SOLN
125.0000 mg | Freq: Once | INTRAVENOUS | Status: AC
  Administered 2011-12-30: 130 mg via INTRAMUSCULAR

## 2011-12-30 NOTE — Patient Instructions (Signed)
Cough, Adult  A cough is a reflex that helps clear your throat and airways. It can help heal the body or may be a reaction to an irritated airway. A cough may only last 2 or 3 weeks (acute) or may last more than 8 weeks (chronic).  CAUSES Acute cough:  Viral or bacterial infections.  Chronic cough:  Infections.   Allergies.   Asthma.   Post-nasal drip.   Smoking.   Heartburn or acid reflux.   Some medicines.   Chronic lung problems (COPD).   Cancer.  SYMPTOMS   Cough.   Fever.   Chest pain.   Increased breathing rate.   High-pitched whistling sound when breathing (wheezing).   Colored mucus that you cough up (sputum).  TREATMENT   A bacterial cough may be treated with antibiotic medicine.   A viral cough must run its course and will not respond to antibiotics.   Your caregiver may recommend other treatments if you have a chronic cough.  HOME CARE INSTRUCTIONS   Only take over-the-counter or prescription medicines for pain, discomfort, or fever as directed by your caregiver. Use cough suppressants only as directed by your caregiver.   Use a cold steam vaporizer or humidifier in your bedroom or home to help loosen secretions.   Sleep in a semi-upright position if your cough is worse at night.   Rest as needed.   Stop smoking if you smoke.  SEEK IMMEDIATE MEDICAL CARE IF:   You have pus in your sputum.   Your cough starts to worsen.   You cannot control your cough with suppressants and are losing sleep.   You begin coughing up blood.   You have difficulty breathing.   You develop pain which is getting worse or is uncontrolled with medicine.   You have a fever.  MAKE SURE YOU:   Understand these instructions.   Will watch your condition.   Will get help right away if you are not doing well or get worse.  Document Released: 06/11/2011 Document Revised: 08/25/2011 Document Reviewed: 06/11/2011 [facility] Patient Information 2012 [place],  [place].Urinary Tract Infection Infections of the urinary tract can start in several places. A bladder infection (cystitis), a kidney infection (pyelonephritis), and a prostate infection (prostatitis) are different types of urinary tract infections (UTIs). They usually get better if treated with medicines (antibiotics) that kill germs. Take all the medicine until it is gone. You or your child may feel better in a few days, but TAKE ALL MEDICINE or the infection may not respond and may become more difficult to treat. HOME CARE INSTRUCTIONS   Drink enough water and fluids to keep the urine clear or pale yellow. Cranberry juice is especially recommended, in addition to large amounts of water.   Avoid caffeine, tea, and carbonated beverages. They tend to irritate the bladder.   Alcohol may irritate the prostate.   Only take over-the-counter or prescription medicines for pain, discomfort, or fever as directed by your caregiver.  To prevent further infections:  Empty the bladder often. Avoid holding urine for long periods of time.   After a bowel movement, women should cleanse from front to back. Use each tissue only once.   Empty the bladder before and after sexual intercourse.  FINDING OUT THE RESULTS OF YOUR TEST Not all test results are available during your visit. If your or your child's test results are not back during the visit, make an appointment with your caregiver to find out the results. Do not assume  everything is normal if you have not heard from your caregiver or the medical facility. It is important for you to follow up on all test results. SEEK MEDICAL CARE IF:   There is back pain.   Your baby is older than 3 months with a rectal temperature of 100.5 F (38.1 C) or higher for more than 1 day.   Your or your child's problems (symptoms) are no better in 3 days. Return sooner if you or your child is getting worse.  SEEK IMMEDIATE MEDICAL CARE IF:   There is severe back pain or  lower abdominal pain.   You or your child develops chills.   You have a fever.   Your baby is older than 3 months with a rectal temperature of 102 F (38.9 C) or higher.   Your baby is 72 months old or younger with a rectal temperature of 100.4 F (38 C) or higher.   There is nausea or vomiting.   There is continued burning or discomfort with urination.  MAKE SURE YOU:   Understand these instructions.   Will watch your condition.   Will get help right away if you are not doing well or get worse.  Document Released: 09/22/2005 Document Revised: 08/25/2011 Document Reviewed: 04/27/2007 [facility] Patient Information 2012 [place], [place].Bronchitis Bronchitis is the body's way of reacting to injury and/or infection (inflammation) of the bronchi. Bronchi are the air tubes that extend from the windpipe into the lungs. If the inflammation becomes severe, it may cause shortness of breath. CAUSES  Inflammation may be caused by:  A virus.   Germs (bacteria).   Dust.   Allergens.   Pollutants and many other irritants.  The cells lining the bronchial tree are covered with tiny hairs (cilia). These constantly beat upward, away from the lungs, toward the mouth. This keeps the lungs free of pollutants. When these cells become too irritated and are unable to do their job, mucus begins to develop. This causes the characteristic cough of bronchitis. The cough clears the lungs when the cilia are unable to do their job. Without either of these protective mechanisms, the mucus would settle in the lungs. Then you would develop pneumonia. Smoking is a common cause of bronchitis and can contribute to pneumonia. Stopping this habit is the single most important thing you can do to help yourself. TREATMENT   Your caregiver may prescribe an antibiotic if the cough is caused by bacteria. Also, medicines that open up your airways make it easier to breathe. Your caregiver may also recommend or prescribe an  expectorant. It will loosen the mucus to be coughed up. Only take over-the-counter or prescription medicines for pain, discomfort, or fever as directed by your caregiver.   Removing whatever causes the problem (smoking, for example) is critical to preventing the problem from getting worse.   Cough suppressants may be prescribed for relief of cough symptoms.   Inhaled medicines may be prescribed to help with symptoms now and to help prevent problems from returning.   For those with recurrent (chronic) bronchitis, there may be a need for steroid medicines.  SEEK IMMEDIATE MEDICAL CARE IF:   During treatment, you develop more pus-like mucus (purulent sputum).   You have a fever.   Your baby is older than 3 months with a rectal temperature of 102 F (38.9 C) or higher.   Your baby is 13 months old or younger with a rectal temperature of 100.4 F (38 C) or higher.   You become  progressively more ill.   You have increased difficulty breathing, wheezing, or shortness of breath.  It is necessary to seek immediate medical care if you are elderly or sick from any other disease. MAKE SURE YOU:   Understand these instructions.   Will watch your condition.   Will get help right away if you are not doing well or get worse.  Document Released: 12/13/2005 Document Revised: 08/25/2011 Document Reviewed: 10/22/2008 [facility] Patient Information 2012 [place], [place].

## 2012-01-02 NOTE — Progress Notes (Signed)
Subjective:     Patient ID: [name], female   DOB: Sep 08, 1928, 78 y.o.   MRN: [id number]  HPI Patient feels bad. She was seen at the [facility] yesterday and sent to the ED because of  pain in her lower extremities. Since she was on coumadin and her INR was stable no further studies were done on her legs.  Extensive review was done over her ED notes . She was given Rocephin , DX w/ a UTI and bronchitis, given fluids, her cardiac enzymes were negative, verbal report CXR was negative, CBC and CMP essentially negative. Her daughter reports that when she got home she started throwing up. Her symptoms staarted over a week ago.   Review of Systems  Constitutional: Positive for fever, activity change and fatigue.  HENT: Positive for congestion and rhinorrhea.   Respiratory: Positive for cough.   Gastrointestinal: Positive for nausea and vomiting.  Genitourinary: Positive for dysuria.  Neurological: Positive for dizziness.      BP 161/85  Pulse 107  Temp(Src) 98.3 F (36.8 C) (Oral)  Wt 173 lb (78.472 kg)  SpO2 92%   Objective:   Physical Exam  Constitutional: She is oriented to person, place, and time. Vital signs are normal. She is cooperative.  Non-toxic appearance. She has a sickly appearance. She appears ill.  HENT:  Mouth/Throat: No oral lesions. Normal dentition. No oropharyngeal exudate.  Neck: Neck supple. No mass present.  Cardiovascular: Normal rate, regular rhythm and normal heart sounds.   Pulmonary/Chest: Effort normal. No respiratory distress. She has wheezes.  Neurological: She is alert and oriented to person, place, and time.  Skin: Skin is warm and dry.       Assessment:     UTI, bronchitis Nausea and vomiting    Plan:     Rocephin 1 gram duoneb and solumedrol 125 mg zofran 8mg  po now and scrip ceftin instead of keflex prescribed in the ED follow up w/Dr Linford Arnold on Monday If symptoms worse need to go back to ED

## 2012-01-04 ENCOUNTER — Encounter: Payer: Self-pay | Admitting: Family Medicine

## 2012-01-04 ENCOUNTER — Ambulatory Visit (INDEPENDENT_AMBULATORY_CARE_PROVIDER_SITE_OTHER): Payer: Medicare Other | Admitting: Family Medicine

## 2012-01-04 VITALS — BP 103/61 | HR 77 | Temp 98.2°F | Wt 168.0 lb

## 2012-01-04 DIAGNOSIS — J40 Bronchitis, not specified as acute or chronic: Secondary | ICD-10-CM

## 2012-01-04 DIAGNOSIS — N390 Urinary tract infection, site not specified: Secondary | ICD-10-CM

## 2012-01-04 NOTE — Progress Notes (Signed)
  Subjective:    Patient ID: ROSALEAH PERSON, female    DOB: Mar 08, 1928, 78 y.o.   MRN: [id number]  HPI  F/u UTI/Bronchitis - No fever. No more vomiting or nausea. Cough is stil productive. Completed the rprednisone. Dysuria has resoled. No fever.   Review of Systems     Objective:   Physical Exam  Constitutional: She is oriented to person, place, and time. She appears well-developed and well-nourished.  HENT:  Head: Normocephalic and atraumatic.  Right Ear: External ear normal.  Left Ear: External ear normal.  Nose: Nose normal.  Mouth/Throat: Oropharynx is clear and moist.       TMs and canals are clear.   Eyes: Conjunctivae and EOM are normal. Pupils are equal, round, and reactive to light.  Neck: Neck supple. No thyromegaly present.  Cardiovascular: Normal rate, regular rhythm and normal heart sounds.   Pulmonary/Chest: Effort normal and breath sounds normal. She has no wheezes.  Lymphadenopathy:    She has no cervical adenopathy.  Neurological: She is alert and oriented to person, place, and time.  Skin: Skin is warm and dry.  Psychiatric: She has a normal mood and affect.          Assessment & Plan:  UTI - Sxs have resolved  Brnchitis - Exam is normal.  Her sxs are improving.  F/U in 1 week to make sure sxs cmpletely resolving.  Make sure complete the antibiotic.   Encouraged her to get shingles vaccine when feeling better. She says she still has the rx.

## 2012-01-12 ENCOUNTER — Encounter: Payer: Self-pay | Admitting: Family Medicine

## 2012-01-12 ENCOUNTER — Ambulatory Visit (INDEPENDENT_AMBULATORY_CARE_PROVIDER_SITE_OTHER): Payer: Medicare Other | Admitting: Family Medicine

## 2012-01-12 ENCOUNTER — Telehealth: Payer: Self-pay | Admitting: *Deleted

## 2012-01-12 VITALS — BP 117/67 | HR 83 | Temp 98.6°F | Wt 180.0 lb

## 2012-01-12 DIAGNOSIS — R682 Dry mouth, unspecified: Secondary | ICD-10-CM

## 2012-01-12 DIAGNOSIS — J40 Bronchitis, not specified as acute or chronic: Secondary | ICD-10-CM

## 2012-01-12 DIAGNOSIS — E538 Deficiency of other specified B group vitamins: Secondary | ICD-10-CM

## 2012-01-12 DIAGNOSIS — K117 Disturbances of salivary secretion: Secondary | ICD-10-CM

## 2012-01-12 DIAGNOSIS — R1310 Dysphagia, unspecified: Secondary | ICD-10-CM

## 2012-01-12 MED ORDER — CYANOCOBALAMIN 1000 MCG/ML IJ SOLN
1000.0000 ug | Freq: Once | INTRAMUSCULAR | Status: AC
  Administered 2012-01-12: 1000 ug via INTRAMUSCULAR

## 2012-01-12 MED ORDER — GABAPENTIN 300 MG PO CAPS: 300.0000 mg | ORAL_CAPSULE | Freq: Every day | ORAL | Status: DC

## 2012-01-12 MED ORDER — GABAPENTIN 600 MG PO TABS: 600.0000 mg | ORAL_TABLET | Freq: Two times a day (BID) | ORAL | Status: DC

## 2012-01-12 NOTE — Telephone Encounter (Signed)
Pharmacy notified.

## 2012-01-12 NOTE — Telephone Encounter (Signed)
She takes both. She take 600mg  in AM and 900mg  (600 +  300)  at bedtime. i verified it with her during OV today

## 2012-01-12 NOTE — Telephone Encounter (Signed)
Need clarification on Neurotin. Chart shows two different orders. One for 300mg  at [facility] & 600mg  BID. Please advise.

## 2012-01-12 NOTE — Progress Notes (Signed)
Subjective:    Patient ID: [name], female    DOB: [birth year], 78 y.o.   MRN: [id number]  HPI She is here to followup her bronchitis. She says she is him is completely better but not quite back to 100%. She still feels a little weak and tired but her cough has resolved. No shortness of breath. No fever. Her only complaint is dry mouth but this is chronic.  She also complains of choking on her food at least once a day. Per daughter it seems to be getting more often. She says occasionally she feels like the food gets hung in the back of her throat. She thinks it is secondary to a dry mouth which she has had for quite some time. She says sometimes she wakes up and the target swallow because she feels there is no moisture in her mouth. She says initially she would only choke on meats and now it happens with other foods. No other worsening or alleviating factors.  Hypertension-blood pressure looks great today. she says she is no longer taking her metoprolol or her Cozaar. This evening she is still taking amlodipine.    Review of Systems BP 117/67  Pulse 83  Temp(Src) 98.6 F (37 C) (Oral)  Wt 180 lb (81.647 kg)  SpO2 95%    Allergies  Allergen Reactions  . Ace Inhibitors     REACTION: cough  . Fosinopril Sodium     REACTION: Cough  . Sulfamethoxazole W/Trimethoprim     REACTION: Unspecified    Past Medical History  Diagnosis Date  . History of kidney stones   . History of colonic polyps   . Atrial fibrillation 1/11     Dr Allyson Sabal  . Ulcer     Gastric  . Esophageal stricture   . Hiatal hernia   . Diarrhea   . Memory loss   . Heart failure     chronic diatolic- 2 D echo 1/08 EF 60%  . Microalbuminuria   . CAD (coronary artery disease)   . Atherosclerosis   . Anemia     Pernicious  . Diabetes mellitus     w/neuro manifestations, type II  . Chronic kidney disease     chronic, stage III  . Cystitis   . Osteoarthrosis involving multiple sites   . Swelling     limb    . Back pain   . Peptic ulcer     unspec. w/o obstruction  . Senile osteoporosis   . IBS (irritable bowel syndrome)   . Hypertension     Past Surgical History  Procedure Date  . Appendectomy   . Egd- esophageal stricture     gastric ulcer  . 2 d echo 1-08    LVEF 55-60%   . Abdnml lv relaxation, mild avr, rv mildly dilated   . Carotid dopplers- no significant stenosis 2007  . Tonsillectomy and adenoidectomy   . Moderate calcification of the right coronary and left anterior descending arteries. on ct scan 4-08  . Normal heart cath 2/10    Dr Allyson Sabal  . Cataract extraction     both eyes  . Pacemaker placement 2/11    History   Social History  . Marital Status: Widowed    Spouse Name: N/A    Number of Children: N/A  . Years of Education: N/A   Occupational History  . Not on file.   Social History Main Topics  . Smoking status: Never Smoker   . Smokeless tobacco: Not  on file  . Alcohol Use: No  . Drug Use: No  . Sexually Active: Not on file   Other Topics Concern  . Not on file   Social History Narrative  . No narrative on file    Family History  Problem Relation Age of Onset  . Diabetes Mother   . Cancer Father     prostate  . Cancer Sister     colon        Objective:   Physical Exam  Constitutional: She is oriented to person, place, and time. She appears well-developed and well-nourished.  HENT:  Head: Normocephalic and atraumatic.  Right Ear: External ear normal.  Left Ear: External ear normal.  Nose: Nose normal.  Mouth/Throat: Oropharynx is clear and moist.  Eyes: Conjunctivae and EOM are normal. Pupils are equal, round, and reactive to light.  Neck: Neck supple. No thyromegaly present.  Cardiovascular: Normal rate, regular rhythm and normal heart sounds.   Pulmonary/Chest: Effort normal and breath sounds normal. She has no wheezes.  Lymphadenopathy:    She has no cervical adenopathy.  Neurological: She is alert and oriented to person,  place, and time.  Skin: Skin is warm and dry.  Psychiatric: She has a normal mood and affect.          Assessment & Plan:  Bronchitis-resolved.  Pernicious anemia-unknown B12 injection given today.  Hypertension-blood pressure looks great today. I would rather she stop the amlodipine and restart her metoprolol with her history of A. fib. We will hold on the Cozaar for now. I will see her back in one month to see what her blood pressure is doing with the changes.  Dry mouth-she has stopped her Lasix. She is also not on her meclizine for vertigo right now.  Dysphagia-I strongly encouraged her to followup with her GI. She may have an esophageal stricture. Patient was thoroughly convinced that it is more from her dry mouth and said of an actual problem with her esophagus. Asked her to at least consider this. I don't want her choking to become more dangerous.

## 2012-01-12 NOTE — Patient Instructions (Signed)
Stop the amlodipine and take the metoprolol  Keep appointment in February.

## 2012-01-20 ENCOUNTER — Other Ambulatory Visit: Payer: Self-pay | Admitting: Family Medicine

## 2012-01-20 ENCOUNTER — Encounter: Payer: Self-pay | Admitting: Family Medicine

## 2012-01-20 ENCOUNTER — Ambulatory Visit (INDEPENDENT_AMBULATORY_CARE_PROVIDER_SITE_OTHER): Payer: Medicare Other | Admitting: Family Medicine

## 2012-01-20 ENCOUNTER — Ambulatory Visit
Admission: RE | Admit: 2012-01-20 | Discharge: 2012-01-20 | Disposition: A | Discharge status: Home / Self Care | Payer: Medicare Other | Source: Ambulatory Visit | Attending: Family Medicine | Admitting: Family Medicine

## 2012-01-20 VITALS — BP 143/71 | HR 79 | Temp 98.0°F | Wt 174.0 lb

## 2012-01-20 DIAGNOSIS — R9389 Abnormal findings on diagnostic imaging of other specified body structures: Secondary | ICD-10-CM

## 2012-01-20 DIAGNOSIS — E039 Hypothyroidism, unspecified: Secondary | ICD-10-CM

## 2012-01-20 DIAGNOSIS — N390 Urinary tract infection, site not specified: Secondary | ICD-10-CM

## 2012-01-20 DIAGNOSIS — R918 Other nonspecific abnormal finding of lung field: Secondary | ICD-10-CM

## 2012-01-20 DIAGNOSIS — R05 Cough: Secondary | ICD-10-CM

## 2012-01-20 DIAGNOSIS — I10 Essential (primary) hypertension: Secondary | ICD-10-CM

## 2012-01-20 LAB — POCT URINALYSIS DIPSTICK
Nitrite, UA: NEGATIVE
Urobilinogen, UA: 0.2
pH, UA: 5.5

## 2012-01-20 MED ORDER — ALBUTEROL SULFATE HFA 108 (90 BASE) MCG/ACT IN AERS: 2.0000 | INHALATION_SPRAY | Freq: Two times a day (BID) | RESPIRATORY_TRACT | Status: DC

## 2012-01-20 MED ORDER — CEPHALEXIN 500 MG PO CAPS: 500.0000 mg | ORAL_CAPSULE | Freq: Three times a day (TID) | ORAL | Status: DC

## 2012-01-20 MED ORDER — PREDNISONE 20 MG PO TABS: ORAL_TABLET | ORAL | Status: DC

## 2012-01-20 NOTE — Progress Notes (Signed)
  Subjective:    Patient ID: [name], female    DOB: 31-Aug-1928, 78 y.o.   MRN: [id number]  HPI Has been coughing a lot. Says getting HA almost daily for the last 4 days.  Not waking up with then. Using Tyelnol for HA. Dec appetite.  Has been nauseated.  Some post nasal drip . Felt some crackling in her chest. Cough has been productive but sitting her chest. No fever but has been complaining of feeling hot when she is usually cold natured.    Review of Systems     Objective:   Physical Exam  Constitutional: She is oriented to person, place, and time. She appears well-developed and well-nourished.  HENT:  Head: Normocephalic and atraumatic.  Right Ear: External ear normal.  Left Ear: External ear normal.  Nose: Nose normal.  Mouth/Throat: Oropharynx is clear and moist.       TMs and canals are clear.   Eyes: Conjunctivae and EOM are normal. Pupils are equal, round, and reactive to light.  Neck: Neck supple. No thyromegaly present.  Cardiovascular: Normal rate, regular rhythm and normal heart sounds.   Pulmonary/Chest: Effort normal and breath sounds normal. She has no wheezes.  Lymphadenopathy:    She has no cervical adenopathy.  Neurological: She is alert and oriented to person, place, and time.  Skin: Skin is warm and dry.  Psychiatric: She has a normal mood and affect.          Assessment & Plan:  Cough - I am worried she may be developing PNA or may just have a post viral cough from her recent bronchitis. If her headaches and postnasal drip and nausea she doesn't have a underlying sinus infection. We will check a CBC.  HTN- BP up a little today. REcheck in 2-3 weeks. Also consider the headaches could be from the changes in her blood pressure medications.  Hypothyroid-she's also due to recheck her thyroid level today.

## 2012-01-20 NOTE — Patient Instructions (Signed)
We will call you with your lab results. If you don't here from [place] in about a week then please give [place] a call at [phone]. We will call you with your chest xray results.

## 2012-01-20 NOTE — Progress Notes (Signed)
Addended by: Wyline Beady on: 01/20/2012 02:15 PM   Modules accepted: Orders

## 2012-01-21 LAB — TSH: TSH: 0.832 u[IU]/mL (ref 0.350–4.500)

## 2012-01-21 LAB — CBC WITH DIFFERENTIAL/PLATELET
Basophils Absolute: 0.1 10*3/uL (ref 0.0–0.1)
Basophils Relative: 1 % (ref 0–1)
Hemoglobin: 12.5 g/dL (ref 12.0–15.0)
MCHC: 30.1 g/dL (ref 30.0–36.0)
Neutro Abs: 7.4 10*3/uL (ref 1.7–7.7)
Neutrophils Relative %: 78 % — ABNORMAL HIGH (ref 43–77)
Platelets: 316 10*3/uL (ref 150–400)
RDW: 15.4 % (ref 11.5–15.5)

## 2012-01-21 NOTE — Progress Notes (Signed)
Addended by: Ellsworth Lennox on: 01/21/2012 11:37 AM   Modules accepted: Orders

## 2012-01-22 LAB — URINE CULTURE: Colony Count: 75000

## 2012-01-24 ENCOUNTER — Other Ambulatory Visit: Payer: Self-pay | Admitting: Family Medicine

## 2012-01-24 MED ORDER — CIPROFLOXACIN HCL 500 MG PO TABS: 500.0000 mg | ORAL_TABLET | Freq: Two times a day (BID) | ORAL | Status: AC

## 2012-01-26 ENCOUNTER — Telehealth: Payer: Self-pay | Admitting: *Deleted

## 2012-01-26 ENCOUNTER — Ambulatory Visit
Admission: RE | Admit: 2012-01-26 | Discharge: 2012-01-26 | Disposition: A | Discharge status: Home / Self Care | Payer: Medicare Other | Source: Ambulatory Visit | Attending: Family Medicine | Admitting: Family Medicine

## 2012-01-26 DIAGNOSIS — R9389 Abnormal findings on diagnostic imaging of other specified body structures: Secondary | ICD-10-CM

## 2012-01-26 NOTE — Telephone Encounter (Signed)
Spoke with daughter [name] and gave the dosage of insulin she should be taking. Informed that CXR was clear. Pt to come in on Friday to see Jade. Pt and daughter agreed.

## 2012-01-26 NOTE — Telephone Encounter (Signed)
Daughter states that pt is hardly eating. States pt fell this afternoon but is ok. Pt tells daughter that "she feels funny" and that she weak and dizzy. Daughter states that everyday activity is taking a toll on pt. That pt has to sit down everytime she gets up to do anything. Daughter states all results are coming back negative and wants to know what else she could do. Daughter states that after pt fell her blood sugar was 97. Daughter states BS this was in 340's and pt took 28 units insulin about 10am. Please advise.

## 2012-01-26 NOTE — Telephone Encounter (Signed)
With her insulin dosing to make sure she's not taking too much and is using the right ones. I am concerned about the rapid drop in her sugars. Certainly trending higher because of her prednisone. Also I want to doublecheck that she essentially filled the prescription and started for the UTI. Certainly this could be contributing to some of her symptoms. She also needs to check her sugars 3 times a day to keep a close eye on him and call if they're going to high or too low. Followup on Friday with the PA before the weekend to make sure that she is improving and her sugars are getting more regulated that we can make adjustments before the weekend.

## 2012-01-28 ENCOUNTER — Ambulatory Visit (INDEPENDENT_AMBULATORY_CARE_PROVIDER_SITE_OTHER): Payer: Medicare Other | Admitting: Physician Assistant

## 2012-01-28 ENCOUNTER — Encounter: Payer: Self-pay | Admitting: Physician Assistant

## 2012-01-28 VITALS — BP 126/70 | HR 80 | Wt 179.0 lb

## 2012-01-28 DIAGNOSIS — R55 Syncope and collapse: Secondary | ICD-10-CM

## 2012-01-28 DIAGNOSIS — N390 Urinary tract infection, site not specified: Secondary | ICD-10-CM

## 2012-01-28 NOTE — Patient Instructions (Addendum)
Dr. Gery Pray to follow up on pacemaker and to get a holter monitor. Will get electrolyte panel today and call with results. Encouraged hydration and discussed ways to avoid falling while having any hypotension when standing up.

## 2012-01-28 NOTE — Progress Notes (Signed)
  Subjective:    Patient ID: [name], female    DOB: Jan 07, 1928, 78 y.o.   MRN: [id number]  HPI Patient presents to clinic for follow-up from UTI and to discuss dizziness.  Patient's UTI symptoms have completely resolved. She takes last dose of antibiotic today.  Patient has had episodes of dizziness and feeling like she is going to pass out the last 6 months. She reports that when the episodes happen it's like her eyes go black in her knees get weak. They last for a few seconds and she is fine after. She had 4 episodes on Wednesday 3 days ago. She had 2 episodes yesterday. She has only fallen once. Daughter has witnessed numerous episodes and reports that she has not loss consciousness. Patient has had vertigo in the past and denies that this feels like vertigo. She's also had episodes of bradycardia and has a pacemaker. She had syncopal episodes before she got the pacemaker and she denies that these episodes feel like what she had before the pacemaker. She has not been to her cardiologist Dr. Gery Pray in about 6 months. She denies nausea, or vomiting. She has had headaches that are located just on the top of her head and they're more frequent than needs to be.when she's having episodes it does feel like her heart starts to race. She denies there being any correlation to the episodes and standing but she has never had an episode while at rest.she denies any numbness or tingling in her arms, headaches, or chest. She has had some numbness and tingling in her bilateral legs but this has been going on for a while. She has no weakness in her extremities or gait changes. She does not like to drink water and she has been craving watermelon for the past couple of months. Review of Systems     Objective:   Physical Exam  Constitutional: She is oriented to person, place, and time. She appears well-developed and well-nourished. No distress.  HENT:  Head: Normocephalic and atraumatic.  Right Ear: External  ear normal.  Left Ear: External ear normal.  Mouth/Throat: Oropharynx is clear and moist.  Eyes: Conjunctivae are normal.  Neck: Normal range of motion. Neck supple.  Cardiovascular: Normal rate, regular rhythm and normal heart sounds.   Pulmonary/Chest: Effort normal and breath sounds normal.       No CVA tenderness.  Musculoskeletal:       Strength 5/5 in bilateral arms and legs.  Neurological: She is alert and oriented to person, place, and time.       Negative Producer, television/film/video. Cranial nerves II-XII intact.  Psychiatric: She has a normal mood and affect. Her behavior is normal.          Assessment & Plan:  Presyncope-electrolyte panel ordered. We'll call with results. EKG-no changes since last EKG/right bundle branch block. We'll call Dr.Berry office suggest Holter monitor for patient to wear to catch with the heart is doing during episodes. Educated patient about the importance of hydration. We discussed things to do to help decrease fall risk with these episode ie. To get up slowly and hold on to objects right after standing up.  UTI-Resolved. No symptoms. Finished last dose of antibiotic today.  Please contact Dr. Allyson Sabal for Holter Monitor.

## 2012-01-29 LAB — ELECTROLYTE PANEL: CO2: 28 mEq/L (ref 19–32)

## 2012-02-01 ENCOUNTER — Encounter: Payer: Self-pay | Admitting: *Deleted

## 2012-02-09 ENCOUNTER — Ambulatory Visit: Payer: Medicare Other | Admitting: Family Medicine

## 2012-02-16 ENCOUNTER — Encounter: Payer: Self-pay | Admitting: Family Medicine

## 2012-02-16 ENCOUNTER — Ambulatory Visit (INDEPENDENT_AMBULATORY_CARE_PROVIDER_SITE_OTHER): Payer: Medicare Other | Admitting: Family Medicine

## 2012-02-16 VITALS — BP 144/79 | HR 104 | Wt 177.0 lb

## 2012-02-16 DIAGNOSIS — Z780 Asymptomatic menopausal state: Secondary | ICD-10-CM

## 2012-02-16 DIAGNOSIS — I10 Essential (primary) hypertension: Secondary | ICD-10-CM

## 2012-02-16 DIAGNOSIS — N390 Urinary tract infection, site not specified: Secondary | ICD-10-CM

## 2012-02-16 DIAGNOSIS — N951 Menopausal and female climacteric states: Secondary | ICD-10-CM

## 2012-02-16 DIAGNOSIS — E119 Type 2 diabetes mellitus without complications: Secondary | ICD-10-CM

## 2012-02-16 LAB — POCT URINALYSIS DIPSTICK
Bilirubin, UA: NEGATIVE
Glucose, UA: NEGATIVE
Nitrite, UA: NEGATIVE
pH, UA: 5.5

## 2012-02-16 MED ORDER — PANTOPRAZOLE SODIUM 40 MG PO TBEC: 40.0000 mg | DELAYED_RELEASE_TABLET | Freq: Every day | ORAL | Status: DC

## 2012-02-16 MED ORDER — OXYCODONE-ACETAMINOPHEN 5-325 MG PO TABS: 1.0000 | ORAL_TABLET | ORAL | Status: DC | PRN

## 2012-02-16 MED ORDER — LOSARTAN POTASSIUM 25 MG PO TABS: 25.0000 mg | ORAL_TABLET | Freq: Every day | ORAL | Status: DC

## 2012-02-16 MED ORDER — CIPROFLOXACIN HCL 500 MG PO TABS: 500.0000 mg | ORAL_TABLET | Freq: Two times a day (BID) | ORAL | Status: AC

## 2012-02-16 MED ORDER — HYDROCODONE-ACETAMINOPHEN 5-325 MG PO TABS: 1.0000 | ORAL_TABLET | Freq: Four times a day (QID) | ORAL | Status: DC | PRN

## 2012-02-16 NOTE — Progress Notes (Signed)
Subjective:    Patient ID: [name], female    DOB: [birth year], 78 y.o.   MRN: [id number]  Diabetes She presents for her follow-up diabetic visit. She has type 2 diabetes mellitus. Her disease course has been stable. Hypoglycemia symptoms include headaches. Pertinent negatives for hypoglycemia include no dizziness. There are no diabetic associated symptoms. There are no hypoglycemic complications. Diabetic complications include autonomic neuropathy. She is following a generally healthy diet. She has not had a previous visit with a dietician. She participates in exercise intermittently. Her breakfast blood glucose range is generally 90-110 mg/dl. An ACE inhibitor/angiotensin II receptor blocker is being taken.   She says her DEXA scan was several years ago.  Having more sweats recently.  She feels  Like has cystitis. Says going frequently.  Having dysuria. Describes her discomfort as burning with each urination. No hematuria. No fever.  She was recently treated for UTI several weeks ago with an antibiotic. She did felt better on the antibiotic and had resolution of symptoms for couple days but then the symptoms recurred. She denies any acute back pain or fever.  dypshagia - Still having dysphagia with meats. Dr. Juanda Chance is her Gi. She has not made an appt yet. At last visit she told me that she would call but she has not.  Review of Systems  Neurological: Positive for headaches. Negative for dizziness.       Objective:   Physical Exam  Constitutional: She is oriented to person, place, and time. She appears well-developed and well-nourished.  HENT:  Head: Normocephalic and atraumatic.  Cardiovascular: Normal rate, regular rhythm and normal heart sounds.   Pulmonary/Chest: Effort normal and breath sounds normal.  Neurological: She is alert and oriented to person, place, and time.  Skin: Skin is warm and dry.  Psychiatric: She has a normal mood and affect. Her behavior is normal.           Assessment & Plan:  DM- not well controlled. She is only using her short acting novolog at bedtime instead of before meals. Discussed moving that shot to 10-15 min before evening the meal.  Then continue the Lantus. Then f/u in 6 weeks.  We will see if we may need to add her short acting NovoLog to a second meal. Because her blood pressure is back up a little bit today I would like her to restart her losartan but I will start at 25 mg instead of 100 mg. Lab Results  Component Value Date   HGBA1C 8.6 02/16/2012   Hypertension-her blood pressure is up a little bit today. She did stop the amlodipine and the metoprolol to bedtime. She is not taking all her certain. Her blood pressure was low at one point and we have held several medications. Because her blood pressure is back up and I would like her to restart losartan at 25 mg instead of 100. Recheck blood pressure in 6 weeks.  Dysuria -has been recurrent.  U/A is positive today. I will go ahead and treat with Cipro for 5 days. I let her know that this will affect her Coumadin so she will need to have this checked in about a week. I also discussed that since her UTIs and cystitis have been recurrent I would like for her to be seen by a urologist. She already has a nephrologist I explained that they take care of different parts of the urinary tract system.  Dysphagia - We called and got her an appt with Dr. Juanda Chance  while she was here.

## 2012-02-17 ENCOUNTER — Ambulatory Visit: Payer: Medicare Other | Admitting: Family Medicine

## 2012-02-19 LAB — MICROALBUMIN / CREATININE URINE RATIO
Creatinine, Urine: 127.6 mg/dL
Microalb Creat Ratio: 18.6 mg/g (ref 0.0–30.0)

## 2012-03-03 ENCOUNTER — Encounter: Payer: Self-pay | Admitting: *Deleted

## 2012-03-14 ENCOUNTER — Ambulatory Visit (INDEPENDENT_AMBULATORY_CARE_PROVIDER_SITE_OTHER): Payer: Medicare Other | Admitting: Internal Medicine

## 2012-03-14 ENCOUNTER — Encounter: Payer: Self-pay | Admitting: Internal Medicine

## 2012-03-14 VITALS — BP 132/64 | HR 66 | Ht 67.0 in | Wt 181.0 lb

## 2012-03-14 DIAGNOSIS — R1319 Other dysphagia: Secondary | ICD-10-CM

## 2012-03-14 DIAGNOSIS — R197 Diarrhea, unspecified: Secondary | ICD-10-CM

## 2012-03-14 DIAGNOSIS — K222 Esophageal obstruction: Secondary | ICD-10-CM

## 2012-03-14 DIAGNOSIS — K5289 Other specified noninfective gastroenteritis and colitis: Secondary | ICD-10-CM

## 2012-03-14 MED ORDER — BUDESONIDE 3 MG PO CP24: ORAL_CAPSULE | ORAL | Status: DC

## 2012-03-14 NOTE — Progress Notes (Signed)
[name] [birth year] MRN [id number]   History of Present Illness:  This is an 78 year old white female with solid food dysphagia of several months duration. She has a history of esophageal stricture which was dilated in May 2005 with [facility] dilator 44 and 48 [place]. She also had a 3 mm antral ulcer and 3 cm hiatal hernia. She denies dysphagia to liquids or to pills. She is also complaining of diarrhea. In May 2005, she was found to have a hyperplastic polyp and nonspecific colitis with crypt abscess. A repeat colonoscopy in December 2009 showed collagenous colitis and a sessile polyp which was found to be a tubular adenoma. She was treated for colitis with prednisone 20 mg a day which was tapered by 5 mg every 2 weeks with complete resolution of her diarrhea. She is now having accidents and fecal incontinence again. She is a diabetic. She has been on B12 supplements and she has a history of pancreatitis. An upper abdominal ultrasound in 2010 showed tiny bilateral renal cysts but was otherwise a normal exam. She has a positive family history of colon polyps in sister. She is due for a recall colonoscopy in December 2014. Patient is a diabetic and she is on Coumadin for tricuspid regurgitation and pulmonary hypertension as well as atrial fibrillation.   Past Medical History  Diagnosis Date  . History of kidney stones   . Atrial fibrillation 1/11     Dr Allyson Sabal  . Ulcer     Gastric  . Esophageal stricture   . Hiatal hernia   . Diarrhea   . Memory loss   . Heart failure     chronic diatolic- 2 D echo 1/08 EF 60%  . Microalbuminuria   . CAD (coronary artery disease)   . Atherosclerosis   . Anemia     Pernicious  . Diabetes mellitus     w/neuro manifestations, type II  . Chronic kidney disease     chronic, stage III  . Cystitis   . Osteoarthrosis involving multiple sites   . Swelling     limb  . Back pain   . Peptic ulcer     unspec. w/o obstruction  . Senile osteoporosis   . IBS  (irritable bowel syndrome)   . Hypertension   . Tubular adenoma of colon    Past Surgical History  Procedure Date  . Appendectomy   . Egd- esophageal stricture     gastric ulcer  . 2 d echo 1-08    LVEF 55-60%   . Abdnml lv relaxation, mild avr, rv mildly dilated   . Carotid dopplers- no significant stenosis 2007  . Tonsillectomy and adenoidectomy   . Moderate calcification of the right coronary and left anterior descending arteries. on ct scan 4-08  . Normal heart cath 2/10    Dr Allyson Sabal  . Cataract extraction     both eyes  . Pacemaker placement 2/11    reports that she has never smoked. She does not have any smokeless tobacco history on file. She reports that she does not drink alcohol or use illicit drugs. family history includes Colon cancer in her sister; Diabetes in her mother; and Prostate cancer in her father. Allergies  Allergen Reactions  . Ace Inhibitors     REACTION: cough  . Fosinopril Sodium     REACTION: Cough  . Sulfamethoxazole W/Trimethoprim     REACTION: Unspecified        Review of Systems: Denies odynophagia, chest pain or shortness of  breath  The remainder of the 10 point ROS is negative except as outlined in H&P   Physical Exam: General appearance  Well developed, in no distress. Eyes- non icteric. HEENT nontraumatic, normocephalic. Mouth no lesions, tongue papillated, no cheilosis. Neck supple without adenopathy, thyroid not enlarged, no carotid bruits, no JVD. Lungs Clear to auscultation bilaterally. Cor normal S1, normal S2, regular rhythm, no murmur,  quiet precordium. Abdomen: Soft with mild tenderness along the descending colon. Normal active bowel sounds. Liver edge at costal margin. No distention. Rectal: Not done. Extremities no pedal edema. Skin no lesions. Neurological alert and oriented x 3. Psychological normal mood and affect.  Assessment and Plan:  Problem #1 Solid food dysphagia consistent with a recurrent esophageal  stricture. We will proceed with an esophageal dilatation off Coumadin. She will discontinue Coumadin 5 days prior to the procedure.  Problem #2 Nonspecific colitis as per colon biopsies from 2005 and 2009. We will give her another prednisone taper this time using Entocort 9 mg daily for 4 weeks then taper to 6 mg daily for 4 weeks and then 3 mg daily for 4 weeks.  Problem #3 History of adenomatous polyps and hyperplastic polyps. Patient has a positive family history of colon polyps in her sister. A recall colonoscopy will be scheduled for December 2014.   03/14/2012 Lina Sar

## 2012-03-14 NOTE — Patient Instructions (Addendum)
You have been scheduled for an endoscopy with propofol. Please follow written instructions given to you at your visit today. We have sent the following medications to your pharmacy for you to pick up at your convenience: Entocort 9 mg x 4 weeks Entocort 6 mg x 4 weeks Entocort 3 mg x 4 weeks, then follow up with Dr Juanda Chance CC: Dr Linford Arnold

## 2012-03-29 ENCOUNTER — Telehealth: Payer: Self-pay | Admitting: Family Medicine

## 2012-03-29 ENCOUNTER — Ambulatory Visit (INDEPENDENT_AMBULATORY_CARE_PROVIDER_SITE_OTHER): Payer: Medicare Other | Admitting: Family Medicine

## 2012-03-29 ENCOUNTER — Encounter: Payer: Self-pay | Admitting: Family Medicine

## 2012-03-29 VITALS — BP 158/86 | HR 90 | Ht 67.0 in | Wt 176.0 lb

## 2012-03-29 DIAGNOSIS — Z9181 History of falling: Secondary | ICD-10-CM

## 2012-03-29 DIAGNOSIS — E119 Type 2 diabetes mellitus without complications: Secondary | ICD-10-CM

## 2012-03-29 DIAGNOSIS — R112 Nausea with vomiting, unspecified: Secondary | ICD-10-CM

## 2012-03-29 DIAGNOSIS — I10 Essential (primary) hypertension: Secondary | ICD-10-CM

## 2012-03-29 DIAGNOSIS — A084 Viral intestinal infection, unspecified: Secondary | ICD-10-CM

## 2012-03-29 DIAGNOSIS — Z1331 Encounter for screening for depression: Secondary | ICD-10-CM

## 2012-03-29 MED ORDER — METOPROLOL SUCCINATE ER 25 MG PO TB24: 25.0000 mg | ORAL_TABLET | Freq: Every day | ORAL | Status: DC

## 2012-03-29 MED ORDER — TEMAZEPAM 15 MG PO CAPS: 15.0000 mg | ORAL_CAPSULE | Freq: Every evening | ORAL | Status: DC | PRN

## 2012-03-29 NOTE — Progress Notes (Signed)
  Subjective:    Patient ID: [name], female    DOB: 1928/05/09, 78 y.o.   MRN: [id number]  HPI Monday vomited severeal time ans has been nauseated.  Some diarrhea. Vomiting has resolving.  No fever. Dec energy. No known sick contacts.  DM - Says sugars have been bettter.  No lows. She is taking her medication regularly. She is just using her NovoLog before her evening now. Previously we found out she was taking at that time she has corrected this. She has noticed some numbness in her feet. She says sometimes it feels like she's walking on balls. She has previously seen [name] and wants to know if she should consider seeing him again.   Review of Systems     Objective:   Physical Exam  Constitutional: She is oriented to person, place, and time. She appears well-developed and well-nourished.  HENT:  Head: Normocephalic and atraumatic.  Cardiovascular: Normal rate, regular rhythm and normal heart sounds.   Pulmonary/Chest: Effort normal and breath sounds normal.  Neurological: She is alert and oriented to person, place, and time.  Skin: Skin is warm and dry.  Psychiatric: She has a normal mood and affect. Her behavior is normal.          Assessment & Plan:  Diabetes-subsequently made some improvements. She is now using her NovoLog before her evening meal. I instructed her to also add to her breakfast. 8 units before meals. Continue current regimen of Lantus. Followup in 6 weeks for repeat A1c. Her monitor try to get her eye exam she is a little overdue. Also we performed her exam today. In did encouraged her to follow back up with [name], podiatry.  Hypertension-elevated today but she has been sick this week. Her blood pressure looks fantastic 6 weeks ago so I am not going to adjust her regimen. We will be checked again in 6 weeks at her followup for diabetes.  Depression screening-PHQ 9 score of 10. No suicidal thoughts. I will discuss with patient if she would  consider seeing someone for counseling we'll possibly consider medication to help with her mood.  Fall assessment-score of 23. She is high risk for falls. I'll mail her some information to help reduce her fall risk in the home. She can go over with her daughter. I also want her to see [name], podiatry for her foot numbness. She may have some early peripheral neuropathy.  Viral gastroenteritis-description sounds like she likely has had viral gastroenteritis. She is Rh- but if her symptoms do not completely resolve within the week and asked to call the office.

## 2012-03-29 NOTE — Telephone Encounter (Signed)
She did screen positive for depression on her questionnaire. I would like her to make an appointment to further discuss this. If she is interested I be happy to set her up for counseling if it would be good for her to with someone. Also on the fall questioning her she was high risk for falls. I definitely want her to see Dr. sample to see if she is developing some neuropathy. Also I will mail her some information to help her reduce her fall risk in the home.

## 2012-03-29 NOTE — Patient Instructions (Addendum)
Try to get your eye exam. Home Safety and Preventing Falls Falls are a leading cause of injury and while they affect all age groups, falls have greater short-term and long-term impact on older age groups. However, falls should not be a part of life or aging. It is possible for individuals and their families to use preventive measures to significantly decrease the likelihood that anyone, especially an older adult, will fall. There are many simple measures which can make your home safer with respect to preventing falls. The following actions can help reduce falls among all members of your family and are especially important as you age, when your balance, lower limb strength, coordination, and eyesight may be declining. The use of preventive measures will help to reduce you and your family's risk of falls and serious medical consequences. OUTDOORS  Repair cracks and edges of walkways and driveways.   Remove high doorway thresholds and trim shrubbery on the main path into your home.   Ensure there is good outside lighting at main entrances and along main walkways.   Clear walkways of tools, rocks, debris, and clutter.   Check that handrails are not broken and are securely fastened. Both sides of steps should have handrails.   In the garage, be attentive to and clean up grease or oil spills on the cement. This can make the surface extremely slippery.   In winter, have leaves, snow, and ice cleared regularly.   Use sand or salt on walkways during winter months.  BATHROOM  Install grab bars by the toilet and in the tub and shower.   Use non-skid mats or decals in the tub or shower.   If unable to easily stand unsupported while showering, place a plastic non slip stool in the shower to sit on when needed.   Install night lights.   Keep floors dry and clean up all water on the floor immediately.   Remove soap buildup in tub or shower on a regular basis.   Secure bath mats with non-slip,  double-sided rug tape.   Remove tripping hazards from the floors.  BEDROOMS  Install night lights.   Do not use oversized bedding.   Make sure a bedside light is easy to reach.   Keep a telephone by your bedside.   Make sure that you can get in and out of your bed easily.   Have a firm chair, with side arms, to use for getting dressed.   Remove clutter from around closets.   Store clothing, bed coverings, and other household items where you can reach them comfortably.   Remove tripping hazards from the floor.  LIVING AREAS AND STAIRWAYS  Turn on lights to avoid having to walk through dark areas.   Keep lighting uniform in each room. Place brighter lightbulbs in darker areas, including stairways.   Replace lightbulbs that burn out in stairways immediately.   Arrange furniture to provide for clear pathways.   Keep furniture in the same place.   Eliminate or tape down electrical cables in high traffic areas.   Place handrails on both sides of stairways. Use handrails when going up or down stairs.   Most falls occur on the top or bottom 3 steps.   Fix any loose handrails. Make sure handrails on both sides of the stairways are as long as the stairs.   Remove all walkway obstacles.   Coil or tape electrical cords off to the side of walking areas and out of the way. If using  many extension cords, have an electrician put in a new wall outlet to reduce or eliminate them.   Make sure spills are cleaned up quickly and allow time for drying before walking on freshly cleaned floors.   Firmly attach carpet with non-skid or two-sided tape.   Keep frequently used items within easy reach.   Remove tripping hazards such as throw rugs and clutter in walkways. Never leave objects on stairs.   Get rid of throw rugs elsewhere if possible.   Eliminate uneven floor surfaces.   Make sure couches and chairs are easy to get into and out of.   Check carpeting to make sure it is firmly  attached along stairs.   Make repairs to worn or loose carpet promptly.   Select a carpet pattern that does not visually hide the edge of steps.   Avoid placing throw rugs or scatter rugs at the top or bottom of stairways, or properly secure with carpet tape to prevent slippage.   Have an electrician put in a light switch at the top and bottom of the stairs.   Get light switches that glow.   Avoid the following practices: hurrying, inattention, obscured vision, carrying large loads, and wearing slip-on shoes.   Be aware of all pets.  KITCHEN  Place items that are used frequently, such as dishes and food, within easy reach.   Keep handles on pots and pans toward the center of the stove. Use back burners when possible.   Make sure spills are cleaned up quickly and allow time for drying.   Avoid walking on wet floors.   Avoid hot utensils and knives.   Position shelves so they are not too high or low.   Place commonly used objects within easy reach.   If necessary, use a sturdy step stool with a grab bar when reaching.   Make sure electrical cables are out of the way.   Do not use floor polish or wax that makes floors slippery.  OTHER HOME FALL PREVENTION STRATEGIES  Wear low heel or rubber sole shoes that are supportive and fit well.   Wear closed toe shoes.   Know and watch for side effects of medications. Have your caregiver or pharmacist look at all your medicines, even over-the-counter medicines. Some medicines can make you sleepy or dizzy.   Exercise regularly. Exercise makes you stronger and improves your balance and coordination.   Limit use of alcohol.   Use eyeglasses if necessary and keep them clean. Have your vision checked every year.   Organize your household in a manner that minimizes the need to walk distances when hurried, or go up and down stairs unnecessarily. For example, have a phone placed on at least each floor of your home. If possible, have a  phone beside each sitting or lying area where you spend the most time at home. Keep emergency numbers posted at all phones.   Use non-skid floor wax.   When using a ladder, make sure:   The base is firm.   All ladder feet are on level ground.   The ladder is angled against the wall properly.   When climbing a ladder, face the ladder and hold the ladder rungs firmly.   If reaching, always keep your hips and body weight centered between the rails.   When using a stepladder, make sure it is fully opened and both spreaders are firmly locked.   Do not climb a closed stepladder.   Avoid climbing beyond  the second step from the top of a stepladder and the 4th rung from the top of an extension ladder.   Learn and use mobility aids as needed.   Change positions slowly. Arise slowly from sitting and lying positions. Sit on the edge of your bed before getting to your feet.   If you have a history of falls, ask someone to add color or contrast paint or tape to grab bars and handrails in your home.   If you have a history of falls, ask someone to place contrasting color strips on first and last steps.   Install an electrical emergency response system if you need one, and know how to use it.   If you have a medical or other condition that causes you to have limited physical strength, it is important that you reach out to family and friends for occasional help.  FOR CHILDREN:  If young children are in the home, use safety gates. At the top of stairs use screw-mounted gates; use pressure-mounted gates for the bottom of the stairs and doorways between rooms.   Young children should be taught to descend stairs on their stomachs, feet first, and later using the handrail.   Keep drawers fully closed to prevent them from being climbed on or pulled out entirely.   Move chairs, cribs, beds and other furniture away from windows.   Consider installing window guards on windows ground floor and up,  unless they are emergency fire exits. Make sure they have easy release mechanisms.   Consider installing special locks that only allow the window to be opened to a certain height.   Never rely on window screens to prevent falls.   Never leave babies alone on changing tables, beds or sofas. Use a changing table that has a restraining strap.   When a child can pull to a standing position, the crib mattress should be adjusted to its lowest position. There should be at least 26 inches between the top rails of the crib drop side and the mattress. Toys, bumper pads, and other objects that can be used as steps to climb out should be removed from the crib.   On bunk beds never allow a child under age 21 to sleep on the top bunk. For older children, if the upper bunk is not against a wall, use guard rails on both sides. No matter how old a child is, keep the guard rails in place on the top bunk since children roll during sleep. Do not permit horseplay on bunks.   Grass and soil surfaces beneath backyard playground equipment should be replaced with hardwood chips, shredded wood mulch, sand, pea gravel, rubber, crushed stone, or another safer material at depths of at least 9 to 12 inches.   When riding bikes or using skates, skateboards, skis, or snowboards, require children to wear helmets. Look for those that have stickers stating that they meet or exceed safety standards.   Vertical posts or pickets in deck, balcony, and stairway railings should be no more than 3 1/2 inches apart if a young baby will have access to the area. The space between horizontal rails or bars, and between the floor and the first horizontal rail or bar, should be no more than 3 1/2 inches.  Document Released: 12/03/2002 Document Revised: 12/02/2011 Document Reviewed: 10/02/2009 [facility] Patient Information 2012 [place], [place].

## 2012-03-30 NOTE — Telephone Encounter (Signed)
Called and a female answered the phone rudely and when I asked for pt he hung up.will mail letter; any info you want me to mail for fall risk you can put in my box and i will mail it along with her letter

## 2012-03-30 NOTE — Telephone Encounter (Signed)
Called pt and she states she will call Dr. Ruthine Dose about getting appt for the neuropathy. She states she would like to hold off on the depression and counseling for right now. KJ LPN

## 2012-04-04 ENCOUNTER — Other Ambulatory Visit: Payer: Self-pay | Admitting: Family Medicine

## 2012-04-04 DIAGNOSIS — E875 Hyperkalemia: Secondary | ICD-10-CM

## 2012-04-04 LAB — LIPID PANEL
Cholesterol: 135 mg/dL (ref 0–200)
Triglycerides: 65 mg/dL (ref <150)
VLDL: 13 mg/dL (ref 0–40)

## 2012-04-04 LAB — COMPLETE METABOLIC PANEL WITH GFR
AST: 15 U/L (ref 0–37)
BUN: 15 mg/dL (ref 6–23)
Calcium: 9.4 mg/dL (ref 8.4–10.5)
Chloride: 107 mEq/L (ref 96–112)
Creat: 1.26 mg/dL — ABNORMAL HIGH (ref 0.50–1.10)
GFR, Est Non African American: 40 mL/min — ABNORMAL LOW

## 2012-04-13 LAB — BASIC METABOLIC PANEL WITH GFR
BUN: 15 mg/dL (ref 6–23)
CO2: 30 mEq/L (ref 19–32)
Chloride: 103 mEq/L (ref 96–112)
Creat: 1.5 mg/dL — ABNORMAL HIGH (ref 0.50–1.10)
GFR, Est Non African American: 32 mL/min — ABNORMAL LOW
Glucose, Bld: 235 mg/dL — ABNORMAL HIGH (ref 70–99)

## 2012-04-18 ENCOUNTER — Telehealth: Payer: Self-pay | Admitting: *Deleted

## 2012-04-18 DIAGNOSIS — E875 Hyperkalemia: Secondary | ICD-10-CM

## 2012-04-18 NOTE — Telephone Encounter (Signed)
I don't think she needs to stop any of her meds for bloodwork that I am aware of.

## 2012-04-18 NOTE — Telephone Encounter (Signed)
Daughter called to find out what medication her mother needed to stop for 1 week before blood work. She states she will bring her for the lab work on 04/26/12. States that her mother just seen the kidney doctor last month and she doesn't go back there for a few months. States her appt on 05/03/12 is for the urologist and a breathing test. FYI.

## 2012-04-19 NOTE — Telephone Encounter (Signed)
Daughter notified and order to have potassium  Re checked faxed to lab. KJ LPN

## 2012-05-10 ENCOUNTER — Ambulatory Visit (INDEPENDENT_AMBULATORY_CARE_PROVIDER_SITE_OTHER): Payer: Medicare Other | Admitting: Family Medicine

## 2012-05-10 ENCOUNTER — Encounter: Payer: Medicare Other | Admitting: Internal Medicine

## 2012-05-10 ENCOUNTER — Encounter: Payer: Self-pay | Admitting: Family Medicine

## 2012-05-10 VITALS — BP 148/77 | HR 86 | Ht 67.0 in | Wt 183.0 lb

## 2012-05-10 DIAGNOSIS — J309 Allergic rhinitis, unspecified: Secondary | ICD-10-CM

## 2012-05-10 DIAGNOSIS — I10 Essential (primary) hypertension: Secondary | ICD-10-CM

## 2012-05-10 MED ORDER — PANTOPRAZOLE SODIUM 40 MG PO TBEC: 40.0000 mg | DELAYED_RELEASE_TABLET | Freq: Every day | ORAL | Status: DC

## 2012-05-10 MED ORDER — LEVOTHYROXINE SODIUM 100 MCG PO TABS: 100.0000 ug | ORAL_TABLET | Freq: Every day | ORAL | Status: DC

## 2012-05-10 NOTE — Patient Instructions (Addendum)
Please get your eye exam.   Lantus  40 units at bedtime.  Then use the novolog every morning and before dinner, 10 units

## 2012-05-10 NOTE — Progress Notes (Signed)
  Subjective:    Patient ID: [name], female    DOB: [birth year], 78 y.o.   MRN: [id number]  HPI DM - A1C was up at last visit ans she was to start insulin before breakfast.  Sugars have been as high as 400.  Moslty 180-220 in the AM.  Not dong the novolog with breakfast and getting most nights with dinner.  18 units of lantus twice a day.  10 of novolog with dinbner when does use it.  Has been eating a little sweets.   AR - HAs been taking caritin -D.  though she says she has not taken it the last couple of days. She says her allergies are actually getting better.    Review of Systems     Objective:   Physical Exam  Constitutional: She is oriented to person, place, and time. She appears well-developed and well-nourished.  HENT:  Head: Normocephalic and atraumatic.  Cardiovascular: Normal rate, regular rhythm and normal heart sounds.   Pulmonary/Chest: Effort normal and breath sounds normal.  Neurological: She is alert and oriented to person, place, and time.  Skin: Skin is warm and dry.  Psychiatric: She has a normal mood and affect. Her behavior is normal.          Assessment & Plan:  DM- change Lantus to 40 units at bedtime.  Then use the novolog every morning and before dinner, 10 units . She met she has a very hard time remembering to take the insulin before breakfast. We discussed strategies to help her do this such as pinning at the kitchen table where she eats breakfast or possibly to her coffee cup first thing in the morning. She would be a great candidate for a Apidra because it is less dependent on dosing before the meal but unfortunately with her insurance it will likely be more expensive.  AR- stop the claritin D and change to claritin.    Hypertension-Her blood pressure is up a little bit today compared to previous reading in March. We will repeat that in one month.

## 2012-05-15 ENCOUNTER — Other Ambulatory Visit: Payer: Self-pay | Admitting: *Deleted

## 2012-05-15 MED ORDER — PRAVASTATIN SODIUM 40 MG PO TABS: 40.0000 mg | ORAL_TABLET | Freq: Every day | ORAL | Status: DC

## 2012-05-29 ENCOUNTER — Other Ambulatory Visit: Payer: Self-pay | Admitting: *Deleted

## 2012-05-29 MED ORDER — INSULIN GLARGINE 100 UNIT/ML ~~LOC~~ SOLN: 18.0000 [IU] | Freq: Two times a day (BID) | SUBCUTANEOUS | Status: DC

## 2012-05-31 ENCOUNTER — Ambulatory Visit (AMBULATORY_SURGERY_CENTER): Payer: Medicare Other | Admitting: Internal Medicine

## 2012-05-31 ENCOUNTER — Encounter: Payer: Self-pay | Admitting: Internal Medicine

## 2012-05-31 VITALS — BP 158/0 | HR 75 | Temp 97.3°F | Resp 22 | Ht 67.0 in | Wt 181.0 lb

## 2012-05-31 DIAGNOSIS — K222 Esophageal obstruction: Secondary | ICD-10-CM

## 2012-05-31 DIAGNOSIS — R1319 Other dysphagia: Secondary | ICD-10-CM

## 2012-05-31 LAB — GLUCOSE, CAPILLARY

## 2012-05-31 MED ORDER — SODIUM CHLORIDE 0.9 % IV SOLN: 500.0000 mL | INTRAVENOUS | Status: DC

## 2012-05-31 NOTE — Op Note (Signed)
[facility] [address] [place], [place]  [address]  ENDOSCOPY PROCEDURE REPORT  PATIENT:  [name], [name]  MR#:  [id number] BIRTHDATE:  07/06/1928, 84 yrs. old  GENDER:  female  ENDOSCOPIST:  Hedwig Morton. Juanda Chance, MD Referred by:  Nani Gasser, M.D.  PROCEDURE DATE:  05/31/2012 PROCEDURE:  EGD with dilatation over guidewire ASA CLASS:  Class III INDICATIONS:  dysphagia dysphagia to solids hx of es. stricture and hiatal hernia,dilated in 2005 to 29F  MEDICATIONS:   MAC sedation, administered by CRNA, propofol (Diprivan) 50 mg TOPICAL ANESTHETIC:  Cetacaine Spray  DESCRIPTION OF PROCEDURE:   After the risks benefits and alternatives of the procedure were thoroughly explained, informed consent was obtained.  The LB GIF-H180 [id number] endoscope was introduced through the mouth and advanced to the second portion of the duodenum, without limitations.  The instrument was slowly withdrawn as the mucosa was fully examined. <<PROCEDUREIMAGES>>  A stricture was found in the distal esophagus. mild es (see image1 and image6).stricture/spasm at the g-e junction,endoscope passed with mild resistance, ?presbyesophagus Savary dilation over a guidewire 17 mm dilator passed without difficulty  A hiatal hernia was found (see image5). 2-3 cm hiatal hernia  Otherwise the examination was normal (see image6, image4, image3, and image2).    Retroflexed views revealed no abnormalities.    The scope was then withdrawn from the patient and the procedure completed.  COMPLICATIONS:  None  ENDOSCOPIC IMPRESSION: 1) Stricture in the distal esophagus 2) Hiatal hernia 3) Otherwise normal examination mild stricture/spasm, s/p passage of 17 mm dilator suspect presbyesophagus/hypomotility contributing to the dysphagia RECOMMENDATIONS: 1) Anti-reflux regimen to be follow consider Barium esophagram to asses the motility if dysphagia persists after the dilation  REPEAT EXAM:  In 0 year(s)  for.  ______________________________ Hedwig Morton. Juanda Chance, MD  CC:  n. eSIGNED:   Hedwig Morton. Vivion Romano at 05/31/2012 10:35 AM  Lazaro Arms, [phone]

## 2012-05-31 NOTE — Patient Instructions (Signed)
Handouts were given to your care partner on esophageal dilatation diet, hiatal hernia and GERD.  Please follow the dilatation diet the rest of the day.  You may resume your prior medications today.  Call if any questions or concerns.    YOU HAD AN ENDOSCOPIC PROCEDURE TODAY AT THE  ENDOSCOPY CENTER: Refer to the procedure report that was given to you for any specific questions about what was found during the examination.  If the procedure report does not answer your questions, please call your gastroenterologist to clarify.  If you requested that your care partner not be given the details of your procedure findings, then the procedure report has been included in a sealed envelope for you to review at your convenience later.  YOU SHOULD EXPECT: Some feelings of bloating in the abdomen. Passage of more gas than usual.  Walking can help get rid of the air that was put into your GI tract during the procedure and reduce the bloating. If you had a lower endoscopy (such as a colonoscopy or flexible sigmoidoscopy) you may notice spotting of blood in your stool or on the toilet paper. If you underwent a bowel prep for your procedure, then you may not have a normal bowel movement for a few days.  DIET:  Drink plenty of fluids but you should avoid alcoholic beverages for 24 hours.  Please follow the dilatation diet the rest of the day.  ACTIVITY: Your care partner should take you home directly after the procedure.  You should plan to take it easy, moving slowly for the rest of the day.  You can resume normal activity the day after the procedure however you should NOT DRIVE or use heavy machinery for 24 hours (because of the sedation medicines used during the test).    SYMPTOMS TO REPORT IMMEDIATELY: A gastroenterologist can be reached at any hour.  During normal business hours, 8:30 AM to 5:00 PM Monday through Friday, call [phone].  After hours and on weekends, please call the GI answering service  at [phone] who will take a message and have the physician on call contact you.     Following upper endoscopy (EGD)  Vomiting of blood or coffee ground material  New chest pain or pain under the shoulder blades  Painful or persistently difficult swallowing  New shortness of breath  Fever of 100F or higher  Black, tarry-looking stools  FOLLOW UP: If any biopsies were taken you will be contacted by phone or by letter within the next 1-3 weeks.  Call your gastroenterologist if you have not heard about the biopsies in 3 weeks.  Our staff will call the home number listed on your records the next business day following your procedure to check on you and address any questions or concerns that you may have at that time regarding the information given to you following your procedure. This is a courtesy call and so if there is no answer at the home number and we have not heard from you through the emergency physician on call, we will assume that you have returned to your regular daily activities without incident.  SIGNATURES/CONFIDENTIALITY: You and/or your care partner have signed paperwork which will be entered into your electronic medical record.  These signatures attest to the fact that that the information above on your After Visit Summary has been reviewed and is understood.  Full responsibility of the confidentiality of this discharge information lies with you and/or your care-partner.

## 2012-05-31 NOTE — Progress Notes (Signed)
Blood sugar- 80, pt asymptomatic.  D5 IV fluids hung at [facility] when IV started

## 2012-05-31 NOTE — Progress Notes (Signed)
No complaints noted in the recovery room. Maw   

## 2012-05-31 NOTE — Progress Notes (Signed)
11:29 The pt was in the wheelchair and waiting to be taken out, her daughter asked me if the pea size lump at the attempted left forearm was normal.  I assessed it and there was a bluish colored bruise and a pea size lump. It was cool to touch.  I told her it was where the blood pooled under the skin in the vein.  I offered her a warm compress to apply to the site, but the pt said no she did not want it kindly.  She said it did not hurt. Maw

## 2012-05-31 NOTE — Progress Notes (Signed)
Patient did not experience any of the following events: a burn prior to discharge; a fall within the facility; wrong site/side/patient/procedure/implant event; or a hospital transfer or hospital admission upon discharge from the facility. (G8907) Patient did not have preoperative order for IV antibiotic SSI prophylaxis. (G8918)  

## 2012-06-01 ENCOUNTER — Telehealth: Payer: Self-pay

## 2012-06-01 NOTE — Telephone Encounter (Signed)
  Follow up Call-  Call back number 05/31/2012  Post procedure Call Back phone  # [phone]  Permission to leave phone message Yes     Patient questions:  Do you have a fever, pain , or abdominal swelling? no Pain Score  0 *  Have you tolerated food without any problems? yes  Have you been able to return to your normal activities? yes  Do you have any questions about your discharge instructions: Diet   no Medications  no Follow up visit  no  Do you have questions or concerns about your Care? no  Actions: * If pain score is 4 or above: No action needed, pain <4.

## 2012-06-07 ENCOUNTER — Encounter: Payer: Self-pay | Admitting: Family Medicine

## 2012-06-07 ENCOUNTER — Ambulatory Visit (INDEPENDENT_AMBULATORY_CARE_PROVIDER_SITE_OTHER): Payer: Medicare Other | Admitting: Family Medicine

## 2012-06-07 VITALS — BP 121/67 | HR 85 | Ht 67.0 in | Wt 183.0 lb

## 2012-06-07 DIAGNOSIS — E119 Type 2 diabetes mellitus without complications: Secondary | ICD-10-CM

## 2012-06-07 DIAGNOSIS — I10 Essential (primary) hypertension: Secondary | ICD-10-CM

## 2012-06-07 MED ORDER — INSULIN GLARGINE 100 UNIT/ML ~~LOC~~ SOLN: 20.0000 [IU] | Freq: Two times a day (BID) | SUBCUTANEOUS | Status: DC

## 2012-06-07 MED ORDER — OXYCODONE-ACETAMINOPHEN 5-325 MG PO TABS: 1.0000 | ORAL_TABLET | Freq: Four times a day (QID) | ORAL | Status: DC | PRN

## 2012-06-07 MED ORDER — HYDROCODONE-ACETAMINOPHEN 5-325 MG PO TABS: 1.0000 | ORAL_TABLET | Freq: Four times a day (QID) | ORAL | Status: DC | PRN

## 2012-06-07 NOTE — Patient Instructions (Addendum)
If sugars are running over 130 in the morning then increase lantus by one unit a day.  Work on goal of 15 minutes total of exercise daily Increase lantus to 20 units twice a day.   Increase novolog to 10 units with evening meal.  Bring in sugarr eading for me next time.

## 2012-06-07 NOTE — Progress Notes (Signed)
  Subjective:    Patient ID: [name], female    DOB: 11/15/1928, 78 y.o.   MRN: [id number]  Diabetes She presents for her follow-up diabetic visit. She has type 2 diabetes mellitus. Her disease course has been stable. There are no diabetic associated symptoms. There are no diabetic complications. She is compliant with treatment all of the time. She is following a generally healthy diet. She rarely participates in exercise. There is no change in her home blood glucose trend. An ACE inhibitor/angiotensin II receptor blocker is being taken.   She did have her esophageal dilatation since I last saw her. She has an appointment for her eye exam and is scheduled in 2 weeks out. Her daughter is here with her today for the office visit. She says she has had significant improvement swallowing etc. since her procedure. She is not very active and is not getting any regular exercise. She did have one evening when her sugar with him is 300 but this is unusual for her. She says her morning sugars seem to be well-controlled his evening sugar that's usually high. Her NovoLog is supposed to be 8 units with each meal but she admits she only uses it with her evening meal.    Review of Systems     Objective:   Physical Exam  Constitutional: She is oriented to person, place, and time. She appears well-developed and well-nourished.  HENT:  Head: Normocephalic and atraumatic.  Cardiovascular: Normal rate, regular rhythm and normal heart sounds.   Pulmonary/Chest: Effort normal and breath sounds normal.  Neurological: She is alert and oriented to person, place, and time.  Skin: Skin is warm and dry.  Psychiatric: She has a normal mood and affect. Her behavior is normal.          Assessment & Plan:  DM- uncontrolled. A1C has improved from 8.6 to8.2. Plan is to increase Lantus to 20 units twice a day. She says she likes to split because that way if she forgets her morning dose she can at least get her  evening dose then. I think that is fine. Also she is usually only taking her NovoLog per her evening meal. She does not use it 3 times a day. We will increase that from 8 units to 10 units. She will followup in 6 weeks and bring in her glucose meter and sugar readings. I also encouraged her to get at least 15 minutes of exercise daily. She can split that up in five-minute increments if needed.  HTN- Well controlled. Continue current regimen.

## 2012-06-14 ENCOUNTER — Ambulatory Visit: Payer: Medicare Other | Admitting: Family Medicine

## 2012-07-19 ENCOUNTER — Encounter: Payer: Self-pay | Admitting: Family Medicine

## 2012-07-19 ENCOUNTER — Ambulatory Visit (INDEPENDENT_AMBULATORY_CARE_PROVIDER_SITE_OTHER): Payer: Medicare Other | Admitting: Family Medicine

## 2012-07-19 VITALS — BP 120/70 | HR 84 | Ht 67.0 in | Wt 184.0 lb

## 2012-07-19 DIAGNOSIS — M79673 Pain in unspecified foot: Secondary | ICD-10-CM

## 2012-07-19 DIAGNOSIS — E039 Hypothyroidism, unspecified: Secondary | ICD-10-CM

## 2012-07-19 DIAGNOSIS — R5383 Other fatigue: Secondary | ICD-10-CM

## 2012-07-19 DIAGNOSIS — M79609 Pain in unspecified limb: Secondary | ICD-10-CM

## 2012-07-19 DIAGNOSIS — R5381 Other malaise: Secondary | ICD-10-CM

## 2012-07-19 DIAGNOSIS — N3020 Other chronic cystitis without hematuria: Secondary | ICD-10-CM

## 2012-07-19 NOTE — Patient Instructions (Addendum)
Increase your Lantus to 22 units twice a day Keep your novolog at 10 units with your evening meal.

## 2012-07-19 NOTE — Progress Notes (Signed)
Subjective:    Patient ID: [name], female    DOB: [birth year], 78 y.o.   MRN: [id number]  HPI She's here to followup on her diabetes. She did bring her glucometer with her. Her 30 day average is 263. Her 14 day average is 236. We recently went up on her Lantus and also increased her NovoLog which she uses with her evening meal from 8 units to 10 units. So far she's tolerating it well and has not had any hypoglycemic events. When I look through her numbers she did have 2 days where she had a fasting sugar of over 300. She says she's not sure what caused it but did not feel well that day. She also complains that she has just felt more fatigued in general lately. She said she can't pinpoint anything and doesn't have a specific pain but just doesn't overall feel well.  Recurrent UTI-she also recently saw urology. She saw her nephrologist and he referred her to a urologist. They wrote a prescription for a non-estrogen tablet that can be used to help stimulate young healthy vaginal tissue. Unfortunately she was unable to afford the medication. She has not called him back to see if he can call in for prescription.  She is also having bilateral foot pain. She said she had one day last week when she had severe pain in her left ankle that radiated up to her knee. She denies any trauma or known injury. She says that treatment her all night. It does feel better today. She did not take any pain relievers for her. She denied any excess swelling to her left ankle does state more swollen than her right. She saw a podiatrist a couple years ago and says she thinks it's time to go back. At that time they had recommended surgery and she was not interested so she did not go back. She also has a history of peripheral neuropathy.  Review of Systems     Objective:   Physical Exam  Constitutional: She is oriented to person, place, and time. She appears well-developed and well-nourished.  HENT:  Head: Normocephalic  and atraumatic.  Cardiovascular: Normal rate, regular rhythm and normal heart sounds.   Pulmonary/Chest: Effort normal and breath sounds normal.  Neurological: She is alert and oriented to person, place, and time.  Skin: Skin is warm and dry.  Psychiatric: She has a normal mood and affect. Her behavior is normal.          Assessment & Plan:  Diabetes-sugars are still elevated then we'll be very cautious about increasing her insulin. Increase Lantus to 22 units twice a day. Continue with NovoLog 10 units at evening meal. We may need to add additional mealtime insulin when I see her back in 6 weeks. Call if she starts to have any hypoglycemic events. Also had a B12 level since she is at high risk of being deficient with her diabetes and her peripheral neuropathy.  Recurrent UTI-I strongly encouraged her to call the urologist back to see if he can call in a different medication for her.  Fatigue-unclear etiology. She does have a history of hypothyroidism her last TSH was in January. We will recheck that today. Also check a CBC to rule out anemia as well as check her electrolytes for any disturbances.  Bilateral foot pain-Will refer her to Dr. Karle Barr in [place]. She says she can only go for Wednesday appointments. That is the day her daughter can drive her. I also encouraged her  to speak with the provider and let him know that she is interested in nonsurgical therapies. Since there was no acute injury and she is feeling better I did not order any x-rays today.

## 2012-07-20 LAB — COMPLETE METABOLIC PANEL WITH GFR
ALT: 16 U/L (ref 0–35)
Albumin: 4.6 g/dL (ref 3.5–5.2)
CO2: 31 mEq/L (ref 19–32)
Calcium: 9.7 mg/dL (ref 8.4–10.5)
Chloride: 104 mEq/L (ref 96–112)
GFR, Est African American: 34 mL/min — ABNORMAL LOW
Potassium: 5.6 mEq/L — ABNORMAL HIGH (ref 3.5–5.3)
Sodium: 142 mEq/L (ref 135–145)
Total Protein: 6.8 g/dL (ref 6.0–8.3)

## 2012-07-20 LAB — CBC WITH DIFFERENTIAL/PLATELET
Basophils Relative: 1 % (ref 0–1)
Eosinophils Absolute: 0.1 10*3/uL (ref 0.0–0.7)
Eosinophils Relative: 1 % (ref 0–5)
MCH: 31.8 pg (ref 26.0–34.0)
MCHC: 32.9 g/dL (ref 30.0–36.0)
MCV: 96.5 fL (ref 78.0–100.0)
Neutrophils Relative %: 77 % (ref 43–77)
Platelets: 321 10*3/uL (ref 150–400)

## 2012-07-20 LAB — VITAMIN B12: Vitamin B-12: 429 pg/mL (ref 211–911)

## 2012-07-20 LAB — TSH: TSH: 1.414 u[IU]/mL (ref 0.350–4.500)

## 2012-08-01 ENCOUNTER — Ambulatory Visit (INDEPENDENT_AMBULATORY_CARE_PROVIDER_SITE_OTHER): Payer: Medicare Other

## 2012-08-01 ENCOUNTER — Ambulatory Visit: Payer: Medicare Other

## 2012-08-01 ENCOUNTER — Encounter: Payer: Self-pay | Admitting: Family Medicine

## 2012-08-01 DIAGNOSIS — M810 Age-related osteoporosis without current pathological fracture: Secondary | ICD-10-CM | POA: Insufficient documentation

## 2012-08-01 DIAGNOSIS — Z780 Asymptomatic menopausal state: Secondary | ICD-10-CM

## 2012-08-08 ENCOUNTER — Other Ambulatory Visit: Payer: Self-pay | Admitting: Family Medicine

## 2012-08-08 MED ORDER — ALENDRONATE SODIUM 70 MG PO TABS: 70.0000 mg | ORAL_TABLET | ORAL | Status: AC

## 2012-08-30 ENCOUNTER — Telehealth: Payer: Self-pay | Admitting: Family Medicine

## 2012-08-30 ENCOUNTER — Ambulatory Visit (INDEPENDENT_AMBULATORY_CARE_PROVIDER_SITE_OTHER): Payer: Medicare Other | Admitting: Family Medicine

## 2012-08-30 ENCOUNTER — Encounter: Payer: Self-pay | Admitting: Family Medicine

## 2012-08-30 VITALS — BP 166/73 | HR 85 | Wt 185.0 lb

## 2012-08-30 DIAGNOSIS — E119 Type 2 diabetes mellitus without complications: Secondary | ICD-10-CM

## 2012-08-30 DIAGNOSIS — E875 Hyperkalemia: Secondary | ICD-10-CM

## 2012-08-30 DIAGNOSIS — M79673 Pain in unspecified foot: Secondary | ICD-10-CM

## 2012-08-30 DIAGNOSIS — R319 Hematuria, unspecified: Secondary | ICD-10-CM

## 2012-08-30 DIAGNOSIS — M79609 Pain in unspecified limb: Secondary | ICD-10-CM

## 2012-08-30 LAB — POCT URINALYSIS DIPSTICK
Bilirubin, UA: NEGATIVE
Glucose, UA: 100
Nitrite, UA: NEGATIVE
Spec Grav, UA: 1.025
Urobilinogen, UA: 0.2

## 2012-08-30 NOTE — Telephone Encounter (Signed)
Records requested

## 2012-08-30 NOTE — Telephone Encounter (Signed)
Call patient am scheduling her for an ultrasound of her kidneys and her pelvis. If this is normal then I would like her to go back and see Dr. Remer Macho for hematuria. She may need a scope of her bladder.

## 2012-08-30 NOTE — Telephone Encounter (Signed)
Please call [facility] here in town and get copies of her office visits and Dr. Remer Macho. We have referred her back in the spring but we have never received any copies of office visit notes.

## 2012-08-30 NOTE — Progress Notes (Signed)
  Subjective:    Patient ID: [name], female    DOB: 05-12-1928, 78 y.o.   MRN: [id number]  HPI DM-Sugars have been lower and more consistent. She did bring in her glucometer with her today. She does need some refills but she's not sure which medication. She denies any hypoglycemic events. She denies any cuts or sores on her feet that are not healing well.  Foot pain - as the pain had become more persistent. Her podiatrist increased  Her neurontin to 900mg  TID.  Thinks this is helping some.  Blood in her urine over the weekend. Hasn't happened since then. Thought says could have been from her rectum.  Still has a rash on her bottom. Saw Urology and is now seeing GYn. They have tried several different creams as well as oral medication.  Stool incontinenc - Says it is getting worse. Says she is afraid to leave the house or she will have an accident.  As a matter of fact she lives 5 min from her and had an accident on the way here.  This is aggrevating teh rash on her bottom.   Review of Systems     Objective:   Physical Exam  Constitutional: She is oriented to person, place, and time. She appears well-developed and well-nourished.  HENT:  Head: Normocephalic and atraumatic.  Cardiovascular: Normal rate, regular rhythm and normal heart sounds.   Pulmonary/Chest: Effort normal and breath sounds normal.  Neurological: She is alert and oriented to person, place, and time.  Skin: Skin is warm and dry.  Psychiatric: She has a normal mood and affect. Her behavior is normal.          Assessment & Plan:  DM- Increase Lantus to 24 units. Followup in 3 months. Call if any hypoglycemic episodes.  Hematuria - unclear whether not the bleeding came from her urine or from her rectum. We did perform a urinalysis today which was positive for blood. i have never received any Urology notes. We initially referred her for recurrent UTI.  Will call to get records. I think she might benefit from  cystoscopy.    Stool incontinence/diarrhea-I. recommend that she discuss this further with her GI specialist Dr. Dickie La.  She has a history of nonspecific colitis. She's been treated in the past with Entocort.    Foot pain - Tolerating inc dose of neurontin. Monitor for sedation.

## 2012-08-30 NOTE — Addendum Note (Signed)
Addended by: Nani Gasser D on: 08/30/2012 02:20 PM   Modules accepted: Orders

## 2012-08-31 LAB — BASIC METABOLIC PANEL WITH GFR
BUN: 16 mg/dL (ref 6–23)
Chloride: 106 mEq/L (ref 96–112)
GFR, Est African American: 39 mL/min — ABNORMAL LOW
GFR, Est Non African American: 34 mL/min — ABNORMAL LOW
Glucose, Bld: 181 mg/dL — ABNORMAL HIGH (ref 70–99)
Potassium: 4.9 mEq/L (ref 3.5–5.3)
Sodium: 143 mEq/L (ref 135–145)

## 2012-09-01 ENCOUNTER — Other Ambulatory Visit: Payer: Self-pay | Admitting: Family Medicine

## 2012-09-05 ENCOUNTER — Other Ambulatory Visit: Payer: Self-pay | Admitting: Family Medicine

## 2012-09-06 ENCOUNTER — Other Ambulatory Visit: Payer: Medicare Other

## 2012-09-06 ENCOUNTER — Ambulatory Visit (HOSPITAL_BASED_OUTPATIENT_CLINIC_OR_DEPARTMENT_OTHER)
Admission: RE | Admit: 2012-09-06 | Discharge: 2012-09-06 | Disposition: A | Discharge status: Home / Self Care | Payer: Medicare Other | Source: Ambulatory Visit | Attending: Family Medicine | Admitting: Family Medicine

## 2012-09-06 ENCOUNTER — Other Ambulatory Visit (HOSPITAL_BASED_OUTPATIENT_CLINIC_OR_DEPARTMENT_OTHER): Payer: Medicare Other

## 2012-09-06 ENCOUNTER — Other Ambulatory Visit: Payer: Self-pay | Admitting: Family Medicine

## 2012-09-06 DIAGNOSIS — R319 Hematuria, unspecified: Secondary | ICD-10-CM

## 2012-09-09 LAB — URINALYSIS, ROUTINE W REFLEX MICROSCOPIC
Nitrite: POSITIVE — AB
Protein, ur: NEGATIVE mg/dL
pH: 5 (ref 5.0–8.0)

## 2012-09-09 LAB — URINALYSIS, MICROSCOPIC ONLY

## 2012-09-11 ENCOUNTER — Other Ambulatory Visit: Payer: Self-pay | Admitting: Family Medicine

## 2012-09-11 MED ORDER — CEPHALEXIN 500 MG PO CAPS: 500.0000 mg | ORAL_CAPSULE | Freq: Three times a day (TID) | ORAL | Status: AC

## 2012-09-12 ENCOUNTER — Other Ambulatory Visit: Payer: Self-pay | Admitting: Family Medicine

## 2012-09-12 DIAGNOSIS — M810 Age-related osteoporosis without current pathological fracture: Secondary | ICD-10-CM

## 2012-09-19 LAB — VITAMIN D 25 HYDROXY (VIT D DEFICIENCY, FRACTURES): Vit D, 25-Hydroxy: 31 ng/mL (ref 30–89)

## 2012-09-22 ENCOUNTER — Other Ambulatory Visit: Payer: Self-pay | Admitting: *Deleted

## 2012-10-13 ENCOUNTER — Telehealth: Payer: Self-pay | Admitting: *Deleted

## 2012-10-13 MED ORDER — METOPROLOL TARTRATE 25 MG PO TABS: 25.0000 mg | ORAL_TABLET | Freq: Two times a day (BID) | ORAL | Status: DC

## 2012-10-13 NOTE — Telephone Encounter (Signed)
Pt.notified

## 2012-10-13 NOTE — Telephone Encounter (Signed)
Pt calls and the Metoprolol XL 25mg  is gonna cost her 73.00 and she can not afford that. Wants to know if you can send something cheaper into [facility] in [place] for her

## 2012-10-13 NOTE — Telephone Encounter (Signed)
Ok, no prob. Sent new med.

## 2012-11-08 ENCOUNTER — Encounter: Payer: Self-pay | Admitting: Family Medicine

## 2012-11-08 ENCOUNTER — Ambulatory Visit (INDEPENDENT_AMBULATORY_CARE_PROVIDER_SITE_OTHER): Payer: Medicare Other | Admitting: Family Medicine

## 2012-11-08 VITALS — BP 142/81 | HR 93 | Wt 158.0 lb

## 2012-11-08 DIAGNOSIS — I10 Essential (primary) hypertension: Secondary | ICD-10-CM

## 2012-11-08 DIAGNOSIS — I4891 Unspecified atrial fibrillation: Secondary | ICD-10-CM

## 2012-11-08 DIAGNOSIS — G939 Disorder of brain, unspecified: Secondary | ICD-10-CM | POA: Insufficient documentation

## 2012-11-08 DIAGNOSIS — G459 Transient cerebral ischemic attack, unspecified: Secondary | ICD-10-CM

## 2012-11-08 DIAGNOSIS — E119 Type 2 diabetes mellitus without complications: Secondary | ICD-10-CM

## 2012-11-08 DIAGNOSIS — I639 Cerebral infarction, unspecified: Secondary | ICD-10-CM

## 2012-11-08 DIAGNOSIS — Z8673 Personal history of transient ischemic attack (TIA), and cerebral infarction without residual deficits: Secondary | ICD-10-CM | POA: Insufficient documentation

## 2012-11-08 MED ORDER — HYDROCODONE-ACETAMINOPHEN 5-325 MG PO TABS
1.0000 | ORAL_TABLET | Freq: Four times a day (QID) | ORAL | Status: DC | PRN
Start: 2012-11-08 — End: 2013-02-07

## 2012-11-08 MED ORDER — LEVOTHYROXINE SODIUM 100 MCG PO TABS: 100.0000 ug | ORAL_TABLET | Freq: Every day | ORAL | Status: DC

## 2012-11-08 MED ORDER — TEMAZEPAM 15 MG PO CAPS: 15.0000 mg | ORAL_CAPSULE | Freq: Every evening | ORAL | Status: DC | PRN

## 2012-11-08 MED ORDER — PRAVASTATIN SODIUM 40 MG PO TABS: 40.0000 mg | ORAL_TABLET | Freq: Every day | ORAL | Status: DC

## 2012-11-08 NOTE — Progress Notes (Signed)
Subjective:    Patient ID: [name], female    DOB: 1928/10/13, 78 y.o.   MRN: [id number]  HPI She's here today for hospital followup. She was seen and admitted at the [facility] on 10/27/2012. She presented with loss of consciousness. She was found to be unre she says she doesn't remember what actually happened. They took her to the hospital. Her glucose was low in the 60s. She was evidently taking a meal when this happened. When she got to the hospital they felt like it was due to a TIA or stroke. She was put on a full dose aspirin for 2 weeks and then can go back down to a baby aspirin. She was also subtherapeutic on her Coumadin adjusted her dose. She is Re: follow up with her cardiologist to have her Coumadin readjusted yesterday. In fact it was too high and they're seeing her back in one week. She denies any chest pain or shortness of breath. She had a normal echocardiogram with ejection fraction of 60-65%. She had normal carotid Dopplers. She had a normal bubble study. They also adjusted her insulin. They discontinued 8 units with each meal and changed her to a sliding scale. She still continues to take her 24 units of Lantus twice a day. She says she's felt well since being home except for feeling a little tired. She denies any speech or mental status changes. She denies any weakness or problems with motor skills.   DM- has had some hypoglycemic events. Has had once since being home fot the last week.   Af- fib.  They adjusted her dose and increased her coumadin.  Had recheck yesterday and it was too high. Has f/u next week with cardiology.  On whole tab on M,W,F.  Now on half tab all other days.    HTN - Home health checked her BP yesterday.  BP dropped when she stood up.      Review of Systems     Objective:   Physical Exam  Constitutional: She is oriented to person, place, and time. She appears well-developed and well-nourished.  HENT:  Head: Normocephalic and  atraumatic.  Neck: Neck supple. No thyromegaly present.  Cardiovascular: Normal rate, regular rhythm and normal heart sounds.        Soft 1/6 SEM  Pulmonary/Chest: Effort normal and breath sounds normal.  Lymphadenopathy:    She has no cervical adenopathy.  Neurological: She is alert and oriented to person, place, and time. No cranial nerve deficit. She exhibits normal muscle tone.       Gait is normal  Skin: Skin is warm and dry.  Psychiatric: She has a normal mood and affect. Her behavior is normal.          Assessment & Plan:  TIA- Continue full dose ASA for 2 weeks then can go back to 81mg  daily. On coumadin as well.   DM - Decrease lantus to 22 units BID and is now using rgular on SSI instead of 8 units with each meal.    Followup in 3 months. I did ask her to call the office if she starts having more hypoglycemic events in which case we may need to decrease her Lantus to 20 units twice a day. 22 units BID and is now using rgular on SSI instead of 8 units with each meal.   A-fib-Being followed for her coumadin. Has recheck next week.   HTN - well controlled today based on her age. Continue current  regimen.  F/u in 3 months.

## 2012-11-10 ENCOUNTER — Other Ambulatory Visit: Payer: Self-pay | Admitting: *Deleted

## 2012-11-10 MED ORDER — INSULIN GLARGINE 100 UNIT/ML ~~LOC~~ SOLN: 22.0000 [IU] | Freq: Two times a day (BID) | SUBCUTANEOUS | Status: DC

## 2012-11-10 MED ORDER — INSULIN ASPART 100 UNIT/ML ~~LOC~~ SOLN: 8.0000 [IU] | Freq: Three times a day (TID) | SUBCUTANEOUS | Status: DC

## 2012-11-13 ENCOUNTER — Other Ambulatory Visit: Payer: Self-pay | Admitting: *Deleted

## 2012-11-14 ENCOUNTER — Telehealth: Payer: Self-pay

## 2012-11-14 NOTE — Telephone Encounter (Signed)
April from [facility] advised

## 2012-11-14 NOTE — Telephone Encounter (Signed)
Ok to DC

## 2012-11-14 NOTE — Telephone Encounter (Signed)
[name] does not want [facility] to come out on the last visit for nursing. Is it ok to DC nursing for last visit.

## 2012-11-15 ENCOUNTER — Other Ambulatory Visit: Payer: Self-pay

## 2012-11-15 DIAGNOSIS — E119 Type 2 diabetes mellitus without complications: Secondary | ICD-10-CM

## 2012-11-15 MED ORDER — INSULIN GLARGINE 100 UNIT/ML ~~LOC~~ SOLN: 22.0000 [IU] | Freq: Two times a day (BID) | SUBCUTANEOUS | Status: DC

## 2012-11-15 NOTE — Telephone Encounter (Signed)
Refill sent to pharmacy.   

## 2012-11-29 ENCOUNTER — Ambulatory Visit: Payer: Medicare Other | Admitting: Family Medicine

## 2013-02-07 ENCOUNTER — Ambulatory Visit (INDEPENDENT_AMBULATORY_CARE_PROVIDER_SITE_OTHER): Payer: Medicare Other | Admitting: Family Medicine

## 2013-02-07 ENCOUNTER — Encounter: Payer: Self-pay | Admitting: Family Medicine

## 2013-02-07 VITALS — BP 126/73 | HR 83 | Ht 67.0 in | Wt 184.0 lb

## 2013-02-07 DIAGNOSIS — K921 Melena: Secondary | ICD-10-CM

## 2013-02-07 DIAGNOSIS — M25511 Pain in right shoulder: Secondary | ICD-10-CM

## 2013-02-07 DIAGNOSIS — M542 Cervicalgia: Secondary | ICD-10-CM

## 2013-02-07 DIAGNOSIS — E1140 Type 2 diabetes mellitus with diabetic neuropathy, unspecified: Secondary | ICD-10-CM

## 2013-02-07 DIAGNOSIS — M25519 Pain in unspecified shoulder: Secondary | ICD-10-CM

## 2013-02-07 DIAGNOSIS — E1142 Type 2 diabetes mellitus with diabetic polyneuropathy: Secondary | ICD-10-CM

## 2013-02-07 DIAGNOSIS — E1149 Type 2 diabetes mellitus with other diabetic neurological complication: Secondary | ICD-10-CM

## 2013-02-07 LAB — POCT GLYCOSYLATED HEMOGLOBIN (HGB A1C): Hemoglobin A1C: 7.7

## 2013-02-07 LAB — POCT UA - MICROALBUMIN

## 2013-02-07 MED ORDER — LOSARTAN POTASSIUM 25 MG PO TABS: ORAL_TABLET | ORAL | Status: DC

## 2013-02-07 MED ORDER — OXYCODONE-ACETAMINOPHEN 5-325 MG PO TABS: 1.0000 | ORAL_TABLET | Freq: Four times a day (QID) | ORAL | Status: DC | PRN

## 2013-02-07 MED ORDER — HYDROCODONE-ACETAMINOPHEN 5-325 MG PO TABS: 1.0000 | ORAL_TABLET | Freq: Four times a day (QID) | ORAL | Status: DC | PRN

## 2013-02-07 NOTE — Progress Notes (Signed)
Subjective:    Patient ID: [name], female    DOB: 01-Apr-1928, 79 y.o.   MRN: [id number]  HPI Says her sugars have been running high over the last month. Says has been having usgars as high as 400.  She feels there is a direct correlation with her sugars and her pain level. When her pain is up her sugars shoot up. Denies any other signs of infection including URI, urinary, or GI sxs  No fever.    Still having blood in her stool. Has  Only happened 3 times.  She has not followed back up with GI. Her gastroenterologist is Dr. Julio Alm at [facility]. She's not had any significant pain in her stomach or bowels. Each time it has happened unexpectedly and the blood has been bright red. The first happen she wasn't really sure if it was coming from her rectum her vaginal area. At this time she was pretty-sure it was rectal. No nausea or vomiting or fever.    Having sign pain in her left shoulder and neck for the last month. Feels like her arthritis.  Says hurting today. It is keeping her up a night. Says sometime she feels pain with shoulder motion. occ shoots to the outside of the arm.  Had xrays of her neck about 2 year ago. I couldn't find any xrays of the shoulder.  Says the hydrocodone and percocet do help her pain. Like to have the percocet for bedtime No other alleviating sxs.  Says rubbing her arm is very tender.  Worse with reaching behind her and above her shoulder.    Feels her peripheral neuropathy is getting worse. ON neurontin. Says sometime goes as high as her knees and keeps her awake at night.  Haven' t checked B12, etc in awhile. Does take a b12 supplement. Says the hydrocodone helps her sometimes.      Review of Systems     Objective:   Physical Exam  Constitutional: She is oriented to person, place, and time. She appears well-developed and well-nourished.  HENT:  Head: Normocephalic and atraumatic.  Cardiovascular: Normal rate, regular rhythm and normal heart sounds.    Pulmonary/Chest: Effort normal and breath sounds normal.  Musculoskeletal:  She's tender over the cervical spine. She's also tender over the right trapezius. She's also mildly tender over the right deltoid. Shoulder is nontender over the a.c. joint and clavicle and scapula. She has decreased extension. She's able to get to 90 and then has difficulty but is able to get 160.  She has decreased internal rotation right compared to left. Positive empty can sign on the right. Pain with crossover. Also  has pain with external rotation  Neurological: She is alert and oriented to person, place, and time.  Skin: Skin is warm and dry.  Psychiatric: She has a normal mood and affect. Her behavior is normal.          Assessment & Plan:  DM - will start using her novolog before dinner. Right now she's mostly using it before breakfast and sometimes at bedtime. I encouraged her to check her sugar before her evening meal and give her Zofran NovoLog and then hopefully her evening sugar will be better and she went to get herself less insulin. I do worry about hurting herself or short acting right at bedtime and going to sleep and potentially having a hypoglycemic event. The right now, she is running so high that she's not had any hypoglycemic events. I would like  to see her back in one month to make sure that her sugars are coming down. I cut asked her to call me in the next week or 2 if she still seeing sugars greater than 300.  Neck and right shoulder pain - I. think she is possibly dispensing rotator cuff syndrome on her right shoulder. She has difficulty with full extension and decreased range of motion in all directions. Positive empty can test. I would like her to see my partner Dr. Rodney Langton for further evaluation and possible injection for pain relief. I think this would help limit the need for her narcotic medications which is very concerning to me. We did discuss potential for increased falls and  confusion and sedation with these types of medications. She does overall use them sparingly but it is still concerning. She does have significant arthritis in her neck. There is an old x-ray in the system from approximately 2 years ago ordered by Dr. Seymour Bars. I did review this.  Blood in  Stool. She really needs to see her GI doctor again. Fortunately the blood is bright red which is somewhat reassuring I would like her exam with Dr. Sallee Lange over at [facility] gast[facility]. Referral placed today.  Peripheral neuropathy - we'll check a TSH as well as B12. She does take a B12 supplement I would like to just rule this out as a possibility since she is diabetic and there is an increased risk of B12 deficiency in this patients.Consider tx with topical compounded medication.  Also consider evaluatio n of her low bak.

## 2013-02-14 ENCOUNTER — Ambulatory Visit (INDEPENDENT_AMBULATORY_CARE_PROVIDER_SITE_OTHER): Payer: Medicare Other | Admitting: Sports Medicine

## 2013-02-14 ENCOUNTER — Ambulatory Visit (INDEPENDENT_AMBULATORY_CARE_PROVIDER_SITE_OTHER): Payer: Medicare Other

## 2013-02-14 DIAGNOSIS — M7500 Adhesive capsulitis of unspecified shoulder: Secondary | ICD-10-CM

## 2013-02-14 DIAGNOSIS — M7501 Adhesive capsulitis of right shoulder: Secondary | ICD-10-CM

## 2013-02-14 LAB — VITAMIN B12: Vitamin B-12: 673 pg/mL (ref 211–911)

## 2013-02-14 LAB — TSH: TSH: 1.501 u[IU]/mL (ref 0.350–4.500)

## 2013-02-14 MED ORDER — TRAMADOL HCL 50 MG PO TABS: 50.0000 mg | ORAL_TABLET | Freq: Three times a day (TID) | ORAL | Status: DC | PRN

## 2013-02-14 NOTE — Assessment & Plan Note (Signed)
Ultrasound-guided glenohumeral joint injection as above. Home exercises. Tramadol. Return in 4 weeks.

## 2013-02-14 NOTE — Progress Notes (Signed)
Quick Note:  All labs are normal. ______ 

## 2013-02-14 NOTE — Progress Notes (Signed)
  Subjective:    I'm seeing this patient as a consultation for: Dr. Linford Arnold.   CC: Right shoulder pain.  HPI: This is a very pleasant 79 year old female comes in with a several week history of pain which he localizes on the anterior joint line of her right shoulder. She denies any trauma and denies any constitutional symptoms but notes severe pain with essentially any movement of her shoulder but particularly external rotation and overhead activities. She's tried some Tylenol but this really was ineffective. Pain is localized predominately and does radiate down to the elbow but not past. She does have neuropathy in a glove and stocking distribution but notes that this does not seem related.    Past medical history, Surgical history, Family history not pertinant except as noted below, Social history, Allergies, and medications have been entered into the medical record, reviewed, and no changes needed.   Review of Systems: No headache, visual changes, nausea, vomiting, diarrhea, constipation, dizziness, abdominal pain, skin rash, fevers, chills, night sweats, weight loss, swollen lymph nodes, body aches, joint swelling, muscle aches, chest pain, shortness of breath, mood changes, visual or auditory hallucinations.   Objective:   General: Well Developed, well nourished, and in no acute distress.  Neuro/Psych: Alert and oriented x3, extra-ocular muscles intact, able to move all 4 extremities, sensation grossly intact. Skin: Warm and dry, no rashes noted.  Respiratory: Not using accessory muscles, speaking in full sentences, trachea midline.  Cardiovascular: Pulses palpable, no extremity edema. Abdomen: Does not appear distended. Right Shoulder: Inspection reveals no abnormalities, atrophy or asymmetry. Palpation is normal with no tenderness over AC joint or bicipital groove. Range of motion is severely limited external rotation of about 10, I can abduct to about 20 the rest of her abduction is  all periscapular. Rotator cuff strength normal throughout. No signs of impingement with negative Neer and Hawkin's tests, empty can sign. Speeds and Yergason's tests normal. No labral pathology noted with negative Obrien's, negative clunk and good stability. Normal scapular function observed. No painful arc and no drop arm sign. No apprehension sign  Procedure: Real-time Ultrasound Guided Injection of right glenohumeral joint Device: GE Logiq E  Ultrasound guided injection is preferred based studies that show increased duration, increased effect, greater accuracy, decreased procedural pain, increased response rate, and decreased cost with ultrasound guided versus blind injection.  Verbal informed consent obtained.  Time-out conducted.  Noted no overlying erythema, induration, or other signs of local infection.  Skin prepped in a sterile fashion.  Local anesthesia: Topical Ethyl chloride.  With sterile technique and under real time ultrasound guidance:  22-gauge spinal needle advanced and real-time guidance into the glenohumeral joint, carefully avoiding the glenoid labrum. 1 cc Kenalog 40, 4 cc lidocaine injected easily. Completed without difficulty  Pain immediately resolved suggesting accurate placement of the medication.  Advised to call if fevers/chills, erythema, induration, drainage, or persistent bleeding.  Images permanently stored and available for review in the ultrasound unit.  Impression: Technically successful ultrasound guided injection. Impression and Recommendations:   This case required medical decision making of moderate complexity.

## 2013-02-15 ENCOUNTER — Telehealth: Payer: Self-pay | Admitting: *Deleted

## 2013-02-15 NOTE — Telephone Encounter (Signed)
Probably having a steroid flare, this is not uncommon. She is to ice it, 20 minutes 3-4 times a day, pain will resolve within one day and she'll start to feel significantly better afterwards.

## 2013-02-15 NOTE — Telephone Encounter (Signed)
Patient's daughter calls & states that her mother is in a massive amount of pain today after the shoulder injection yesterday.  "cramps" all over her body.  Daughter states that mom can't move her hands & her toes are "standing straight up". Pt was hurting so bad that she couldn't call us herself today.  Daughter states that mom was practically in tears.

## 2013-02-16 NOTE — Telephone Encounter (Signed)
Daughter notified.  She states that mom is feeling a little better so far this morning.  It wasn't her shoulder that was hurting so bad, it was cramping all over her body.  I told her daughter that if she starts feeling worse again today as the day goes on to call us back.

## 2013-02-19 ENCOUNTER — Telehealth: Payer: Self-pay | Admitting: *Deleted

## 2013-02-19 NOTE — Telephone Encounter (Signed)
Increase Lantus to 26 units twice a day. And then call us back on Wednesday and let us know if the blood sugars are doing after increasing her dose the next couple of days. Also increase NovoLog, mealtime insulin, 10 units before each meal instead of 8. If she starts dropping too low then please let me know.

## 2013-02-19 NOTE — Telephone Encounter (Signed)
Daughter notified and verbalizwed understanding. Barry Dienes, LPN

## 2013-02-19 NOTE — Telephone Encounter (Signed)
Daughter calls and states that her mothers blood sugars are staying in the 400 range. Had cortisone injection and knows that will cause them to be elevated but this has been going on 1 week prior to the injection and you wanted them to call with some readings. Readings have been 401, 444, 556, 443, 596.

## 2013-02-21 ENCOUNTER — Telehealth: Payer: Self-pay | Admitting: *Deleted

## 2013-02-21 NOTE — Telephone Encounter (Signed)
Daughter calls back today to update you on her mother blood sugar levels since increasing her insulin. Yesterday running in the 200's, last night was279. This morning when woke up it was 80 and at 11:45 today it was 249. So sugars are doing better

## 2013-02-22 ENCOUNTER — Other Ambulatory Visit: Payer: Self-pay | Admitting: Family Medicine

## 2013-03-14 ENCOUNTER — Encounter: Payer: Self-pay | Admitting: Sports Medicine

## 2013-03-14 ENCOUNTER — Ambulatory Visit (INDEPENDENT_AMBULATORY_CARE_PROVIDER_SITE_OTHER): Payer: Medicare Other | Admitting: Sports Medicine

## 2013-03-14 VITALS — BP 158/74 | HR 102

## 2013-03-14 DIAGNOSIS — M7500 Adhesive capsulitis of unspecified shoulder: Secondary | ICD-10-CM

## 2013-03-14 DIAGNOSIS — M7501 Adhesive capsulitis of right shoulder: Secondary | ICD-10-CM

## 2013-03-14 NOTE — Progress Notes (Signed)
  Subjective:    CC: Followup injection.  HPI:  Adhesive capsulitis: I saw this pleasant young lady approximately a month ago, I injected her glenohumeral joint on the right side of her guidance. She had several days of complete pain relief, and today reports that her pain is gone from a 10 to a one .  She is very happy with the results but unfortunately has not been doing the home rehabilitation exercises.    Past medical history, Surgical history, Family history not pertinant except as noted below, Social history, Allergies, and medications have been entered into the medical record, reviewed, and no changes needed.   Review of Systems: No headache, visual changes, nausea, vomiting, diarrhea, constipation, dizziness, abdominal pain, skin rash, fevers, chills, night sweats, weight loss, swollen lymph nodes, body aches, joint swelling, muscle aches, chest pain, shortness of breath, mood changes, visual or auditory hallucinations.   Objective:   General: Well Developed, well nourished, and in no acute distress.  Neuro/Psych: Alert and oriented x3, extra-ocular muscles intact, able to move all 4 extremities, sensation grossly intact. Skin: Warm and dry, no rashes noted.  Respiratory: Not using accessory muscles, speaking in full sentences, trachea midline.  Cardiovascular: Pulses palpable, no extremity edema. Abdomen: Does not appear distended. Right shoulder: range of motion is improved greatly, she still has some minimal pain and lack of range of motion external rotation, but otherwise she is normal.  Impression and Recommendations:   This case required medical decision making of moderate complexity.

## 2013-03-14 NOTE — Assessment & Plan Note (Signed)
90% resolved status post glenohumeral joint injection. Ramp up the rehabilitation exercises. Come back to see me as needed.

## 2013-03-21 ENCOUNTER — Encounter: Payer: Self-pay | Admitting: Pharmacist Clinician (PhC)/ Clinical Pharmacy Specialist

## 2013-03-21 ENCOUNTER — Ambulatory Visit: Payer: Medicare Other | Admitting: Family Medicine

## 2013-03-21 DIAGNOSIS — I4891 Unspecified atrial fibrillation: Secondary | ICD-10-CM

## 2013-03-21 DIAGNOSIS — Z7901 Long term (current) use of anticoagulants: Secondary | ICD-10-CM | POA: Insufficient documentation

## 2013-03-28 ENCOUNTER — Encounter: Payer: Self-pay | Admitting: Internal Medicine

## 2013-03-28 ENCOUNTER — Other Ambulatory Visit: Payer: Self-pay | Admitting: Cardiology

## 2013-03-28 ENCOUNTER — Other Ambulatory Visit (INDEPENDENT_AMBULATORY_CARE_PROVIDER_SITE_OTHER): Payer: Medicare Other

## 2013-03-28 ENCOUNTER — Ambulatory Visit (INDEPENDENT_AMBULATORY_CARE_PROVIDER_SITE_OTHER): Payer: Medicare Other | Admitting: Internal Medicine

## 2013-03-28 ENCOUNTER — Ambulatory Visit
Admission: RE | Admit: 2013-03-28 | Discharge: 2013-03-28 | Disposition: A | Discharge status: Home / Self Care | Payer: Medicare Other | Source: Ambulatory Visit | Attending: Cardiology | Admitting: Cardiology

## 2013-03-28 VITALS — BP 138/50 | HR 100 | Ht 65.75 in | Wt 185.4 lb

## 2013-03-28 DIAGNOSIS — M79671 Pain in right foot: Secondary | ICD-10-CM

## 2013-03-28 DIAGNOSIS — Z800 Family history of malignant neoplasm of digestive organs: Secondary | ICD-10-CM

## 2013-03-28 DIAGNOSIS — K5289 Other specified noninfective gastroenteritis and colitis: Secondary | ICD-10-CM

## 2013-03-28 DIAGNOSIS — K625 Hemorrhage of anus and rectum: Secondary | ICD-10-CM

## 2013-03-28 LAB — CBC WITH DIFFERENTIAL/PLATELET
Basophils Absolute: 0 10*3/uL (ref 0.0–0.1)
HCT: 38.7 % (ref 36.0–46.0)
Hemoglobin: 12.9 g/dL (ref 12.0–15.0)
Lymphs Abs: 1 10*3/uL (ref 0.7–4.0)
MCHC: 33.3 g/dL (ref 30.0–36.0)
MCV: 95.9 fl (ref 78.0–100.0)
Monocytes Relative: 7.3 % (ref 3.0–12.0)
Neutro Abs: 5.5 10*3/uL (ref 1.4–7.7)
RDW: 14.1 % (ref 11.5–14.6)

## 2013-03-28 LAB — SEDIMENTATION RATE: Sed Rate: 26 mm/hr — ABNORMAL HIGH (ref 0–22)

## 2013-03-28 MED ORDER — BUDESONIDE 3 MG PO CP24: ORAL_CAPSULE | ORAL | Status: DC

## 2013-03-28 NOTE — Patient Instructions (Addendum)
Your physician has requested that you go to the basement for the following lab work before leaving today: CBC, Sed Rate  We have sent the following medications to your pharmacy for you to pick up at your convenience: Entocort-Take 2 tablets by mouth once daily x 2 weeks, then decrease to 1 tablet daily x 2 weeks.  Please call us back in 4 weeks after you have taken the entocort. Our number is [phone].  CC: Dr Nani Gasser

## 2013-03-28 NOTE — Progress Notes (Signed)
[name] [birth year] MRN [id number]  History of Present Illness:  This is an 79 year old white female with recurrent diarrhea. 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 Biopsies showed crypt abscesses. She responded to a prednisone taper and to Entocort. Her last colonoscopy in December 2009 showed collagenous colitis. She has a family history of colon cancer in her sister and personal history of tubular adenoma on her last colonoscopy in 2009. We saw her one year ago for dysphagia due to presbyesophagus and esophageal stricture which was dilated with a 17 mm dilator with good results. She is currently denying any dysphagia or odynophagia. She is complaining of stool incontinence, diarrhea, and rectal bleeding. She has been on Coumadin for atrial fibrillation and pulmonary hypertension due to tricuspid insufficiency and is followed by Dr Erlene Quan.   Past Medical History  Diagnosis Date  . History of kidney stones   . Atrial fibrillation 1/11     Dr Allyson Sabal  . Ulcer     Gastric  . Esophageal stricture   . Hiatal hernia   . Diarrhea   . Memory loss   . Heart failure     chronic diatolic- 2 D echo 1/08 EF 60%  . Microalbuminuria   . CAD (coronary artery disease)   . Atherosclerosis   . Anemia     Pernicious  . Diabetes mellitus     w/neuro manifestations, type II  . Chronic kidney disease     chronic, stage III  . Cystitis   . Osteoarthrosis involving multiple sites   . Swelling     limb, left more than right ankle   . Back pain   . Peptic ulcer     unspec. w/o obstruction  . Senile osteoporosis   . IBS (irritable bowel syndrome)   . Hypertension   . Tubular adenoma of colon   . GERD (gastroesophageal reflux disease)   . Heart murmur   . Hyperlipidemia   . Thyroid disease   . Pancreatitis    Past Surgical History  Procedure Laterality Date  . Appendectomy    . Egd- esophageal stricture      gastric ulcer  . 2 d echo  1-08    LVEF  55-60%   . Abdnml lv relaxation, mild avr, rv mildly dilated    . Carotid dopplers- no significant stenosis  2007  . Tonsillectomy and adenoidectomy    . Moderate calcification of the right coronary and left anterior descending arteries. on ct scan  4-08  . Normal heart cath  2/10    Dr Allyson Sabal  . Cataract extraction      both eyes  . Pacemaker placement  2/11    reports that she has never smoked. She does not have any smokeless tobacco history on file. She reports that she does not drink alcohol or use illicit drugs. family history includes Colon cancer in her sister; Diabetes in her mother; and Prostate cancer in her father.  There is no history of Esophageal cancer, and Rectal cancer, and Stomach cancer, . Allergies  Allergen Reactions  . Ace Inhibitors     REACTION: cough  . Fosinopril Sodium     REACTION: Cough  . Sulfamethoxazole W-Trimethoprim     REACTION: Unspecified        Review of Systems:Negative for dysphagia odynophagia positive for diarrhea and intermittent rectal bleeding denies abdominal pain  The remainder of the 10 point ROS is negative except as outlined in H&P  Physical Exam: General appearance  Well developed, in no distress. Eyes- non icteric. HEENT nontraumatic, normocephalic. Mouth no lesions, tongue papillated, no cheilosis. Neck supple without adenopathy, thyroid not enlarged, no carotid bruits, no JVD. Lungs Clear to auscultation bilaterally. Cor normal S1, normal S2, quiet precordium., Abdomen: tender in right lower and middle quadrant. No palpable mass or stool. No distention. No bruit. Liver edge at costal margin. Rectal:And anoscopic exam reveals decreased rectal sphincter tone. No external hemorrhoids. Minimal internal hemorrhoids without bleeding. No evidence of proctitis bleeding or exudate. Stool is Hemoccult negative. Extremities no pedal edema. Skin no lesions. Neurological alert and oriented x 3. Psychological normal mood and  affect.  Assessment and Plan:  Problem #59 79 year old white female with a history of nonspecific colitis and most recently collagenous colitis. She has responded in the past to oral steroids. We will give her a trial of Entocort 6 mg daily for 2 weeks and 3 mg daily for  subsequent 2 weeks. She will be checking her blood sugar since she is a diabetic and may have to adjust her insulin accordingly. We have given her a few samples. If her prescription is very expensive, I will consider switching her to a prednisone taper. She will call us in 4 weeks with a status up-to-date.  Problem #2 Family history of colon cancer and personal history of adenomatous polyps. Her last colonoscopy was in December 2009. A recall colonoscopy will be due in December 2014 or earlier if her symptoms persist. We will be checking her CBC and sedimentation rate today.   03/28/2013 Lina Sar

## 2013-03-29 ENCOUNTER — Other Ambulatory Visit: Payer: Self-pay | Admitting: *Deleted

## 2013-03-29 MED ORDER — PREDNISONE 10 MG PO TABS: ORAL_TABLET | ORAL | Status: DC

## 2013-03-30 ENCOUNTER — Other Ambulatory Visit (HOSPITAL_COMMUNITY): Payer: Self-pay | Admitting: Cardiology

## 2013-03-30 DIAGNOSIS — R0602 Shortness of breath: Secondary | ICD-10-CM

## 2013-04-04 ENCOUNTER — Ambulatory Visit (HOSPITAL_COMMUNITY)
Admission: RE | Admit: 2013-04-04 | Discharge: 2013-04-04 | Disposition: A | Discharge status: Home / Self Care | Payer: Medicare Other | Source: Ambulatory Visit | Attending: Cardiovascular Disease | Admitting: Cardiovascular Disease

## 2013-04-04 DIAGNOSIS — I10 Essential (primary) hypertension: Secondary | ICD-10-CM | POA: Insufficient documentation

## 2013-04-04 DIAGNOSIS — R0609 Other forms of dyspnea: Secondary | ICD-10-CM | POA: Insufficient documentation

## 2013-04-04 DIAGNOSIS — E119 Type 2 diabetes mellitus without complications: Secondary | ICD-10-CM | POA: Insufficient documentation

## 2013-04-04 DIAGNOSIS — E785 Hyperlipidemia, unspecified: Secondary | ICD-10-CM | POA: Insufficient documentation

## 2013-04-04 DIAGNOSIS — R0989 Other specified symptoms and signs involving the circulatory and respiratory systems: Secondary | ICD-10-CM | POA: Insufficient documentation

## 2013-04-04 DIAGNOSIS — R0602 Shortness of breath: Secondary | ICD-10-CM

## 2013-04-04 DIAGNOSIS — Z9289 Personal history of other medical treatment: Secondary | ICD-10-CM

## 2013-04-04 HISTORY — DX: Personal history of other medical treatment: Z92.89

## 2013-04-04 NOTE — Progress Notes (Signed)
2D Echo Performed 04/04/2013    Thurl Boen, RCS  

## 2013-04-11 ENCOUNTER — Encounter: Payer: Self-pay | Admitting: Family Medicine

## 2013-04-11 ENCOUNTER — Ambulatory Visit (INDEPENDENT_AMBULATORY_CARE_PROVIDER_SITE_OTHER): Payer: Medicare Other | Admitting: Family Medicine

## 2013-04-11 VITALS — BP 125/69 | HR 94 | Wt 188.0 lb

## 2013-04-11 DIAGNOSIS — I10 Essential (primary) hypertension: Secondary | ICD-10-CM

## 2013-04-11 DIAGNOSIS — E1149 Type 2 diabetes mellitus with other diabetic neurological complication: Secondary | ICD-10-CM

## 2013-04-11 DIAGNOSIS — R238 Other skin changes: Secondary | ICD-10-CM

## 2013-04-11 MED ORDER — PANTOPRAZOLE SODIUM 40 MG PO TBEC: 40.0000 mg | DELAYED_RELEASE_TABLET | Freq: Every day | ORAL | Status: DC

## 2013-04-11 NOTE — Patient Instructions (Signed)
Increase Novolog to 10 units with each meal.  Continue Lantus.   Fax me your numbers in 2 weeks.

## 2013-04-11 NOTE — Progress Notes (Signed)
  Subjective:    Patient ID: [name], female    DOB: 01/01/1928, 79 y.o.   MRN: [id number]  HPI DM - sugars running 300-400s.Says sometimes forgets the evening dose of her NovoLog with dinner. She says especially when she goes out to eat she doesn't usually take her insulin pen with her.. Says it make her feel tired.  She says occasionally she does dose her NovoLog at bedtime if her sugar is running fairly high. She did bring in her glucometer with her today I reviewed the memory. Otherwise no hypoglycemic events. No wounds or sores that are not healing well.  Hypertension-  Pt denies chest pain, SOB, dizziness, or heart palpitations.  Taking meds as directed w/o problems.  Denies medication side effects.   She's had a lot of bruising with her Coumadin and aspirin. She denies any GI problems or side effects. She does take a PPI and in fact needs a refill on this today. She does have large bruise on her left forearm has a knot underneath it and she just wants to make sure that it's okay. She has not felt poorly and she has been afebrile.   Review of Systems     Objective:   Physical Exam  Constitutional: She is oriented to person, place, and time. She appears well-developed and well-nourished.  HENT:  Head: Normocephalic and atraumatic.  Cardiovascular: Normal rate, regular rhythm and normal heart sounds.   Pulmonary/Chest: Effort normal and breath sounds normal.  Neurological: She is alert and oriented to person, place, and time.  Skin: Skin is warm and dry.  Psychiatric: She has a normal mood and affect. Her behavior is normal.          Assessment & Plan:  DM - uncontrolled. I would like her to increase her NovoLog 10 units with each meal and worked really hard on trying to get her evening meal dose in. I then want her to fax me her blood sugar results over the next 2 weeks for me to review and then we can make another adjustment to her insulin. I also wonder if some of her  good numbers from the morning are actually result of taking a bedtime dose of short-acting insulin. I encouraged her not to do this and to actually take her evening meal insulin regularly so that I can better gauge if we need to increase her long-acting Lantus. Followup in about 6 weeks.  Hypertension-well-controlled. Continue current regimen.  Easy bruising-secondary to Coumadin and aspirin. Stays on her age and risk of bleeding it would be a consideration to take away at least the aspirin. She has not been falling frequently but does have large bruises over her forearm. I reassured her about a knot under her skin. Most likely a calcification from a hematoma. The aspirin does help reduce her risk of stroke intervention with her coronary artery disease but at certain point there may be more risk than benefit with the medication especially in combination with Coumadin. I just encourage her to think about the options.  Time spent 25 minutes, greater than 50% of the time counseling about diabetes, insulin dosing, and pros and cons of aspirin therapy.

## 2013-04-16 ENCOUNTER — Encounter: Payer: Self-pay | Admitting: *Deleted

## 2013-05-01 ENCOUNTER — Other Ambulatory Visit: Payer: Self-pay | Admitting: *Deleted

## 2013-05-01 MED ORDER — GABAPENTIN 600 MG PO TABS: 600.0000 mg | ORAL_TABLET | Freq: Two times a day (BID) | ORAL | Status: DC

## 2013-05-03 ENCOUNTER — Other Ambulatory Visit: Payer: Self-pay | Admitting: Family Medicine

## 2013-05-04 ENCOUNTER — Other Ambulatory Visit: Payer: Self-pay | Admitting: *Deleted

## 2013-05-04 MED ORDER — TEMAZEPAM 15 MG PO CAPS: 15.0000 mg | ORAL_CAPSULE | Freq: Every evening | ORAL | Status: DC | PRN

## 2013-05-09 ENCOUNTER — Encounter: Payer: Self-pay | Admitting: Family Medicine

## 2013-05-09 ENCOUNTER — Other Ambulatory Visit: Payer: Self-pay | Admitting: *Deleted

## 2013-05-09 ENCOUNTER — Ambulatory Visit (INDEPENDENT_AMBULATORY_CARE_PROVIDER_SITE_OTHER): Payer: Medicare Other | Admitting: Family Medicine

## 2013-05-09 VITALS — BP 123/67 | HR 90 | Wt 182.0 lb

## 2013-05-09 DIAGNOSIS — E1140 Type 2 diabetes mellitus with diabetic neuropathy, unspecified: Secondary | ICD-10-CM

## 2013-05-09 DIAGNOSIS — Z7901 Long term (current) use of anticoagulants: Secondary | ICD-10-CM

## 2013-05-09 DIAGNOSIS — E1142 Type 2 diabetes mellitus with diabetic polyneuropathy: Secondary | ICD-10-CM

## 2013-05-09 DIAGNOSIS — I4891 Unspecified atrial fibrillation: Secondary | ICD-10-CM

## 2013-05-09 DIAGNOSIS — E1149 Type 2 diabetes mellitus with other diabetic neurological complication: Secondary | ICD-10-CM

## 2013-05-09 DIAGNOSIS — M7501 Adhesive capsulitis of right shoulder: Secondary | ICD-10-CM

## 2013-05-09 LAB — POCT INR: INR: 1.9

## 2013-05-09 MED ORDER — TRAMADOL HCL 50 MG PO TABS: 50.0000 mg | ORAL_TABLET | Freq: Three times a day (TID) | ORAL | Status: DC | PRN

## 2013-05-09 NOTE — Patient Instructions (Signed)
Increase Lantus to 30 units.  Increase your Novolog to 12 units with each meal.

## 2013-05-09 NOTE — Progress Notes (Signed)
  Subjective:    Patient ID: [name], female    DOB: May 25, 1928, 79 y.o.   MRN: [id number]  HPI Here for DM-Her sugars is still high.  Sugar have been running in the 200-200. Had one over 500. She is on prednisone and will be for aobut 3 more weeks.  Some inc thrist but not inc urination.  Feels she is doing well with a diet.  Says having some urinary urgency   Diabetic neuropathy-she is still struggling with her neuropathy. She says that most of the time her feet are okay but they will suddenly start burning and become extremely painful almost like a switch has been flipped. She said 1 minute she signed the next minute it's extremely painful and burning and she will ask her daughter to bring her pill. Review of Systems     Objective:   Physical Exam  Constitutional: She is oriented to person, place, and time. She appears well-developed and well-nourished.  HENT:  Head: Normocephalic and atraumatic.  Cardiovascular: Normal rate, regular rhythm and normal heart sounds.   Pulmonary/Chest: Effort normal and breath sounds normal.  Neurological: She is alert and oriented to person, place, and time.  Skin: Skin is warm and dry.  Psychiatric: She has a normal mood and affect. Her behavior is normal.          Assessment & Plan:  DM - we'll increase her Lantus to 30 units and increase NovoLog to 12 units with each meal. She will fax me her blood sugars in about a week so that we can make some adjustments over the phone. Otherwise we'll see her back in 6 weeks. Not to lift exam performed today. HemoglobinA1C is definitely elevated and is uncontrolled right now. I would like to at least get her hemoglobin A1c under 7.5.  Diabetic neuropathy-she is Re: on Neurontin. We discussed that there are other medications that could be used but they may or may not be more helpful than the Neurontin. I think at this point we really need to get her blood sugars under better control and then make decisions  about her what to change her medication regimen. She R. uses some topical treatments from time to time.  Atrial fibrillation-we did her INR today for her convenience. We will fax that over to Dr. Lynett Fish at [facility] said he can adjust her Coumadin level.

## 2013-05-10 ENCOUNTER — Ambulatory Visit (INDEPENDENT_AMBULATORY_CARE_PROVIDER_SITE_OTHER): Payer: Self-pay | Admitting: Pharmacist Clinician (PhC)/ Clinical Pharmacy Specialist

## 2013-05-10 ENCOUNTER — Other Ambulatory Visit: Payer: Self-pay | Admitting: *Deleted

## 2013-05-10 DIAGNOSIS — I4891 Unspecified atrial fibrillation: Secondary | ICD-10-CM

## 2013-05-10 DIAGNOSIS — Z7901 Long term (current) use of anticoagulants: Secondary | ICD-10-CM

## 2013-05-10 MED ORDER — HYDROCODONE-ACETAMINOPHEN 5-325 MG PO TABS: 1.0000 | ORAL_TABLET | Freq: Four times a day (QID) | ORAL | Status: DC | PRN

## 2013-05-10 NOTE — Progress Notes (Signed)
Pt requesting printed refill of norco.[name], [name]

## 2013-05-10 NOTE — Progress Notes (Signed)
Entered as anti-coag encounter, pt notified. kla

## 2013-05-11 LAB — COMPLETE METABOLIC PANEL WITH GFR
Albumin: 4.2 g/dL (ref 3.5–5.2)
Alkaline Phosphatase: 42 U/L (ref 39–117)
BUN: 25 mg/dL — ABNORMAL HIGH (ref 6–23)
Calcium: 9.2 mg/dL (ref 8.4–10.5)
Chloride: 103 mEq/L (ref 96–112)
GFR, Est Non African American: 29 mL/min — ABNORMAL LOW
Glucose, Bld: 202 mg/dL — ABNORMAL HIGH (ref 70–99)
Potassium: 5.6 mEq/L — ABNORMAL HIGH (ref 3.5–5.3)

## 2013-05-16 ENCOUNTER — Other Ambulatory Visit: Payer: Self-pay | Admitting: Cardiovascular Disease

## 2013-05-16 ENCOUNTER — Telehealth: Payer: Self-pay | Admitting: *Deleted

## 2013-05-16 NOTE — Telephone Encounter (Signed)
Daughter calls to let you know her mothers blood sugars are dropping in the mornings. This am was 49. Sugars are fine during the day. States you wanted to know if this happened

## 2013-05-16 NOTE — Telephone Encounter (Signed)
Pt notified od MD instructions. Barry Dienes, LPN

## 2013-05-16 NOTE — Telephone Encounter (Signed)
Decrease her novolog back down to 10 units with each meal.  If still dropping low then call back.

## 2013-05-17 ENCOUNTER — Telehealth: Payer: Self-pay | Admitting: *Deleted

## 2013-05-17 NOTE — Telephone Encounter (Signed)
Decrease lantus to 36 units and call me on Monday and let me know what morning sugars do over the weekend. May take a day or two to regulate

## 2013-05-17 NOTE — Telephone Encounter (Signed)
Daughter calls back this morning and states her mothers sugar this morning was 65

## 2013-05-18 NOTE — Telephone Encounter (Signed)
Daughter notified of med change and will call on Monday with results.

## 2013-05-22 ENCOUNTER — Other Ambulatory Visit: Payer: Self-pay | Admitting: Cardiovascular Disease

## 2013-05-22 ENCOUNTER — Telehealth: Payer: Self-pay | Admitting: *Deleted

## 2013-05-22 DIAGNOSIS — Z7901 Long term (current) use of anticoagulants: Secondary | ICD-10-CM

## 2013-05-22 DIAGNOSIS — E875 Hyperkalemia: Secondary | ICD-10-CM

## 2013-05-22 DIAGNOSIS — I10 Essential (primary) hypertension: Secondary | ICD-10-CM

## 2013-05-22 DIAGNOSIS — I4891 Unspecified atrial fibrillation: Secondary | ICD-10-CM

## 2013-05-22 LAB — COMPREHENSIVE METABOLIC PANEL
ALT: 26 U/L (ref 0–35)
AST: 29 U/L (ref 0–37)
Albumin: 4.2 g/dL (ref 3.5–5.2)
BUN: 19 mg/dL (ref 6–23)
Calcium: 9.7 mg/dL (ref 8.4–10.5)
Chloride: 103 mEq/L (ref 96–112)
Potassium: 5.6 mEq/L — ABNORMAL HIGH (ref 3.5–5.3)
Sodium: 141 mEq/L (ref 135–145)
Total Protein: 6.4 g/dL (ref 6.0–8.3)

## 2013-05-22 NOTE — Telephone Encounter (Signed)
Lab order placed.Marland KitchenMarland KitchenMarland KitchenLoralee Ingram [place]

## 2013-05-23 ENCOUNTER — Ambulatory Visit (INDEPENDENT_AMBULATORY_CARE_PROVIDER_SITE_OTHER): Payer: Self-pay | Admitting: Pharmacist Clinician (PhC)/ Clinical Pharmacy Specialist

## 2013-05-23 DIAGNOSIS — I4891 Unspecified atrial fibrillation: Secondary | ICD-10-CM

## 2013-05-23 DIAGNOSIS — Z7901 Long term (current) use of anticoagulants: Secondary | ICD-10-CM

## 2013-05-29 ENCOUNTER — Other Ambulatory Visit: Payer: Self-pay | Admitting: Family Medicine

## 2013-05-30 LAB — COMPREHENSIVE METABOLIC PANEL
ALT: 22 U/L (ref 0–35)
CO2: 30 mEq/L (ref 19–32)
Calcium: 9.3 mg/dL (ref 8.4–10.5)
Chloride: 105 mEq/L (ref 96–112)
Creat: 1.52 mg/dL — ABNORMAL HIGH (ref 0.50–1.10)
Glucose, Bld: 189 mg/dL — ABNORMAL HIGH (ref 70–99)
Total Protein: 6.4 g/dL (ref 6.0–8.3)

## 2013-06-04 ENCOUNTER — Telehealth: Payer: Self-pay | Admitting: Family Medicine

## 2013-06-04 NOTE — Telephone Encounter (Signed)
Reviewed blood sugar logs. She is dropping down into the 40s and 50s in the morning but at bedtime her sugars are running around300./ she has been giving herself extra novolog at bedtime. I told her to stop giving herself extra NovoLog at bedtime even if her sugars 300. V town overnight on its own. I definitely want to get his morning sugars back up because I think is causing her to be obtunded in the morning. It is very dangerous especially at her age. We also cut back on the Lantus from 36 units twice a day to 32 units twice a day. If her daughter is not noticing significant improvement in her numbers in the next 2-3 days and she's to call me back. I spoke with the patient herself and with her daughter [name]. [name] supplement number is [phone].

## 2013-06-06 ENCOUNTER — Telehealth: Payer: Self-pay | Admitting: Physician Assistant

## 2013-06-06 ENCOUNTER — Encounter: Payer: Self-pay | Admitting: Physician Assistant

## 2013-06-06 ENCOUNTER — Ambulatory Visit (INDEPENDENT_AMBULATORY_CARE_PROVIDER_SITE_OTHER): Payer: Medicare Other | Admitting: Physician Assistant

## 2013-06-06 VITALS — BP 170/80 | HR 87 | Wt 190.0 lb

## 2013-06-06 DIAGNOSIS — E162 Hypoglycemia, unspecified: Secondary | ICD-10-CM

## 2013-06-06 LAB — GLUCOSE, POCT (MANUAL RESULT ENTRY): POC Glucose: 223 mg/dl — AB (ref 70–99)

## 2013-06-06 MED ORDER — GLUCAGON (RDNA) 1 MG IJ KIT: 1.0000 mg | PACK | Freq: Once | INTRAMUSCULAR | Status: DC | PRN

## 2013-06-06 NOTE — Telephone Encounter (Signed)
Pt.notified

## 2013-06-06 NOTE — Telephone Encounter (Signed)
Please let patient know that potassium was great. 4.0 best it has been in a while.

## 2013-06-06 NOTE — Progress Notes (Signed)
  Subjective:    Patient ID: [name], female    DOB: 06-14-1928, 79 y.o.   MRN: [id number]  HPI Patient presents to the clinic today after ER visit this morning. Daughter found mother unresponsive this AM and called EMS. Glucose was 36. Went to [facility] and CT was done that was normal. Electrolytes were good potassium was 4.0.Potassium had been elevated for the past couple of months. UA and cardiac enzymes were negative. WBC 11.6. Chest xray normal. Pt has been in communication with Dr. Linford Arnold over past 2 months with very low sugars in the am. Dr. Linford Arnold has been decreasing insulin slowing. Before this morning pt was on 32 units of lantus twice a day and 10 units of novolog at 3 meals. It is suspected that insulin had to be increased due to prednisone. Pt finished prednisone 5 days ago. Last night sugar before bed was 229. Daugther is very concerned. Pt feels much better currently.   HtN- pt has not taken any medication this morning.         Review of Systems     Objective:   Physical Exam  Constitutional: She is oriented to person, place, and time. She appears well-developed and well-nourished.  HENT:  Head: Normocephalic and atraumatic.  Cardiovascular: Normal rate, regular rhythm and normal heart sounds.   Pulmonary/Chest: Effort normal and breath sounds normal.  Neurological: She is alert and oriented to person, place, and time.  Skin: Skin is warm and dry.  Psychiatric: She has a normal mood and affect. Her behavior is normal.          Assessment & Plan:  Hypoglycemic event- Random sugar 223 currently. Reassured pt that potassium looked great. Decreased insulin to lantus 26 twice a day and no novolog at breakfast but 10 units before lunch and dinner. Continue to check sugars at bedtime, in the morning, and before at least 1 meal. Keep log and make office aware if dropping below 80. Daughter is very concerned when finding mother unresponsive. Gave  glucagon kit to use if patient not able to eat or drink to bring glucose up. Follow up with Dr. Linford Arnold on 06/20/13.   HTN- No changes made today. Continue on medication.

## 2013-06-06 NOTE — Patient Instructions (Addendum)
Take 26 units of Lantus twice a day. And keep at 10 units before 2 largest meals.  Check in the morning, at bedtime, before at least 1 meal. Keep log and follow up with Dr. Linford Arnold in 2 weeks.   Will call and get potassium labs.

## 2013-06-07 ENCOUNTER — Other Ambulatory Visit: Payer: Self-pay | Admitting: Family Medicine

## 2013-06-11 ENCOUNTER — Telehealth: Payer: Self-pay | Admitting: *Deleted

## 2013-06-11 NOTE — Telephone Encounter (Signed)
Ok but they seem to be coming up slowly. Let's decrease lantus to 22 units twice a day. Keeping novolog only at lunch and dinner. Call me with anything under 80.

## 2013-06-11 NOTE — Telephone Encounter (Signed)
Patient's daughter calls today to give an update on the patient's blood sugars.  She states that they're still low.  On the 12th the fasting was 52, the 13th was 106, 14th was 58, 15th was 61, & this morning is 62. Daughter states that pt feels extremely tired.

## 2013-06-11 NOTE — Telephone Encounter (Signed)
Patient's daughter notified.

## 2013-06-12 ENCOUNTER — Other Ambulatory Visit: Payer: Self-pay | Admitting: Cardiovascular Disease

## 2013-06-12 ENCOUNTER — Ambulatory Visit (INDEPENDENT_AMBULATORY_CARE_PROVIDER_SITE_OTHER): Payer: Self-pay | Admitting: Pharmacist Clinician (PhC)/ Clinical Pharmacy Specialist

## 2013-06-12 DIAGNOSIS — I4891 Unspecified atrial fibrillation: Secondary | ICD-10-CM

## 2013-06-12 DIAGNOSIS — Z7901 Long term (current) use of anticoagulants: Secondary | ICD-10-CM

## 2013-06-12 LAB — PROTIME-INR
INR: 2.48 — ABNORMAL HIGH (ref <1.50)
Prothrombin Time: 25.1 seconds — ABNORMAL HIGH (ref 11.6–15.2)

## 2013-06-13 ENCOUNTER — Encounter: Payer: Self-pay | Admitting: Family Medicine

## 2013-06-20 ENCOUNTER — Encounter: Payer: Self-pay | Admitting: Family Medicine

## 2013-06-20 ENCOUNTER — Telehealth: Payer: Self-pay | Admitting: Family Medicine

## 2013-06-20 ENCOUNTER — Ambulatory Visit (INDEPENDENT_AMBULATORY_CARE_PROVIDER_SITE_OTHER): Payer: Medicare Other | Admitting: Family Medicine

## 2013-06-20 VITALS — BP 121/67 | HR 84 | Wt 192.0 lb

## 2013-06-20 DIAGNOSIS — R5383 Other fatigue: Secondary | ICD-10-CM

## 2013-06-20 DIAGNOSIS — R5381 Other malaise: Secondary | ICD-10-CM

## 2013-06-20 DIAGNOSIS — E1149 Type 2 diabetes mellitus with other diabetic neurological complication: Secondary | ICD-10-CM

## 2013-06-20 DIAGNOSIS — I5032 Chronic diastolic (congestive) heart failure: Secondary | ICD-10-CM

## 2013-06-20 DIAGNOSIS — M25473 Effusion, unspecified ankle: Secondary | ICD-10-CM

## 2013-06-20 MED ORDER — OXYCODONE-ACETAMINOPHEN 5-325 MG PO TABS: 1.0000 | ORAL_TABLET | Freq: Four times a day (QID) | ORAL | Status: DC | PRN

## 2013-06-20 MED ORDER — HYDROCODONE-ACETAMINOPHEN 5-325 MG PO TABS: 1.0000 | ORAL_TABLET | Freq: Four times a day (QID) | ORAL | Status: DC | PRN

## 2013-06-20 NOTE — Patient Instructions (Addendum)
Decrease lantus to 45 units and decrease Novolog to 8 units with lunch and dinner.  Want sugar under 250 at bedtime Want sugar over 80 in the AM.

## 2013-06-20 NOTE — Telephone Encounter (Signed)
Pt called and informed her daughter [name] to increase lasix to whole tab for a couple of days. Laureen Ochs, [name]

## 2013-06-20 NOTE — Telephone Encounter (Signed)
Increase Lasix to whole tab for couple days to try to get some extra fluid off.

## 2013-06-20 NOTE — Progress Notes (Signed)
  Subjective:    Patient ID: [name], female    DOB: May 19, 1928, 79 y.o.   MRN: [id number]  HPI Diabetic followup. She is still having problems with hypoglycemic events but overall she is much better.  Seh is here with her daughter today. She saw my PA Lesly Rubenstein a couple of weeks ago with the adjustments below: Decreased insulin to lantus 26 twice a day and no novolog at breakfast but 10 units before lunch and dinner.   Waking up with sweats occ. She has been off the prednisone for about 2 weeks.  Brought in home sugars and and in AM still having occ lows but can run 200-400 at beditme. She usually eats her evening around 6 PM and then has a snack around 9 and goes to bed around 11. She did stop giving her NovoLog at her evening meal for a couple of days after she had low numbers at bedtime..    She has noted more ankle swelling recently. This is been a problem on and off. She denies any increase in shortness of breath. She typically has chronic shortness of breath with activity but feels it has not worsened. She denies increase in salt intake. It has gotten worse as the weather has gotten warmer. She does have CKD 3. Review of Systems     Objective:   Physical Exam  Constitutional: She is oriented to person, place, and time. She appears well-developed and well-nourished.  HENT:  Head: Normocephalic and atraumatic.  Cardiovascular: Normal rate, regular rhythm and normal heart sounds.   Pulmonary/Chest: Effort normal and breath sounds normal.  Musculoskeletal: She exhibits edema.  1+ ankle edema bilat  Neurological: She is alert and oriented to person, place, and time.  Skin: Skin is warm and dry.  Psychiatric: She has a normal mood and affect. Her behavior is normal.          Assessment & Plan:  DM- Decrease lantus to 45 units and decrease Novolog to 8 units with lunch and dinner.  I asked her daughter to fax me her numbers in one to 2 weeks and we can make some adjustments of the  thumb is still needed. Otherwise followup in one month.  Chronic diastolic heart failure-with increase in ankle swelling I want to recheck her electrolytes and kidney function. She also has CKD 3. I suspect she is just getting a little increase in venous dilation because of the warmer weather. No change in shortness of breath. She can increase her Lasix to a whole tab for couple days to try to get extra fluid off.

## 2013-06-21 LAB — COMPLETE METABOLIC PANEL WITH GFR
ALT: 19 U/L (ref 0–35)
AST: 31 U/L (ref 0–37)
Alkaline Phosphatase: 56 U/L (ref 39–117)
GFR, Est Non African American: 27 mL/min — ABNORMAL LOW
Sodium: 150 mEq/L — ABNORMAL HIGH (ref 135–145)
Total Bilirubin: 0.7 mg/dL (ref 0.3–1.2)
Total Protein: 7 g/dL (ref 6.0–8.3)

## 2013-06-21 LAB — CBC WITH DIFFERENTIAL/PLATELET
Basophils Absolute: 0.1 10*3/uL (ref 0.0–0.1)
Basophils Relative: 1 % (ref 0–1)
Eosinophils Absolute: 0.1 10*3/uL (ref 0.0–0.7)
MCH: 31.9 pg (ref 26.0–34.0)
MCHC: 32.9 g/dL (ref 30.0–36.0)
Neutro Abs: 5.3 10*3/uL (ref 1.7–7.7)
Neutrophils Relative %: 66 % (ref 43–77)
Platelets: 370 10*3/uL (ref 150–400)

## 2013-06-21 LAB — URINALYSIS
Bilirubin Urine: NEGATIVE
Glucose, UA: NEGATIVE mg/dL
Specific Gravity, Urine: 1.008 (ref 1.005–1.030)
Urobilinogen, UA: 0.2 mg/dL (ref 0.0–1.0)
pH: 6.5 (ref 5.0–8.0)

## 2013-06-22 ENCOUNTER — Telehealth: Payer: Self-pay | Admitting: *Deleted

## 2013-06-22 DIAGNOSIS — E875 Hyperkalemia: Secondary | ICD-10-CM

## 2013-06-22 DIAGNOSIS — R319 Hematuria, unspecified: Secondary | ICD-10-CM

## 2013-06-22 DIAGNOSIS — E870 Hyperosmolality and hypernatremia: Secondary | ICD-10-CM

## 2013-06-22 NOTE — Telephone Encounter (Signed)
Labs entered. Kimberly Gordon, LPN  

## 2013-06-26 ENCOUNTER — Telehealth: Payer: Self-pay | Admitting: Family Medicine

## 2013-06-26 ENCOUNTER — Other Ambulatory Visit: Payer: Self-pay | Admitting: Family Medicine

## 2013-06-26 DIAGNOSIS — M545 Low back pain: Secondary | ICD-10-CM

## 2013-06-26 LAB — URINALYSIS, ROUTINE W REFLEX MICROSCOPIC
Nitrite: NEGATIVE
Protein, ur: NEGATIVE mg/dL
Specific Gravity, Urine: 1.023 (ref 1.005–1.030)
Urobilinogen, UA: 0.2 mg/dL (ref 0.0–1.0)

## 2013-06-26 LAB — SEDIMENTATION RATE: Sed Rate: 15 mm/hr (ref 0–22)

## 2013-06-26 LAB — URINALYSIS, MICROSCOPIC ONLY
Bacteria, UA: NONE SEEN
Crystals: NONE SEEN

## 2013-06-26 LAB — BASIC METABOLIC PANEL
Glucose, Bld: 245 mg/dL — ABNORMAL HIGH (ref 70–99)
Potassium: 3.6 mEq/L (ref 3.5–5.3)
Sodium: 142 mEq/L (ref 135–145)

## 2013-06-26 LAB — C-REACTIVE PROTEIN: CRP: <0.5 mg/dL (ref <0.60)

## 2013-06-26 MED ORDER — INSULIN GLARGINE 100 UNIT/ML ~~LOC~~ SOLN: 35.0000 [IU] | Freq: Every day | SUBCUTANEOUS | Status: DC

## 2013-06-26 NOTE — Telephone Encounter (Signed)
Call pt and her daughter: Dec lantus to 35 units once a day. Make sure not giving extrea insulin at bedtime.  i put in order for xray for her lumbar spine. She can go today if possible or tomorrow. We are closed Friday. Send me new sugar readings next week.

## 2013-06-27 NOTE — Telephone Encounter (Signed)
Left detailed vm with instructions and asked to call back with any questions.Loralee Pacas [place]

## 2013-06-28 ENCOUNTER — Ambulatory Visit (HOSPITAL_BASED_OUTPATIENT_CLINIC_OR_DEPARTMENT_OTHER)
Admission: RE | Admit: 2013-06-28 | Discharge: 2013-06-28 | Disposition: A | Discharge status: Home / Self Care | Payer: Medicare Other | Source: Ambulatory Visit | Attending: Family Medicine | Admitting: Family Medicine

## 2013-06-28 DIAGNOSIS — Q762 Congenital spondylolisthesis: Secondary | ICD-10-CM | POA: Insufficient documentation

## 2013-06-28 DIAGNOSIS — M47817 Spondylosis without myelopathy or radiculopathy, lumbosacral region: Secondary | ICD-10-CM | POA: Insufficient documentation

## 2013-06-28 DIAGNOSIS — M51379 Other intervertebral disc degeneration, lumbosacral region without mention of lumbar back pain or lower extremity pain: Secondary | ICD-10-CM | POA: Insufficient documentation

## 2013-06-28 DIAGNOSIS — M5137 Other intervertebral disc degeneration, lumbosacral region: Secondary | ICD-10-CM | POA: Insufficient documentation

## 2013-06-28 DIAGNOSIS — M545 Low back pain, unspecified: Secondary | ICD-10-CM | POA: Insufficient documentation

## 2013-07-03 ENCOUNTER — Other Ambulatory Visit: Payer: Self-pay | Admitting: Family Medicine

## 2013-07-03 DIAGNOSIS — M4310 Spondylolisthesis, site unspecified: Secondary | ICD-10-CM

## 2013-07-03 DIAGNOSIS — M47816 Spondylosis without myelopathy or radiculopathy, lumbar region: Secondary | ICD-10-CM

## 2013-07-09 ENCOUNTER — Telehealth: Payer: Self-pay | Admitting: Family Medicine

## 2013-07-09 NOTE — Telephone Encounter (Signed)
Call pt: sugars look ing much better.  Lowest was 62.  Let continue current regimen. Continue lantus 35 units at bedtime  and meantime insulin  Still no bedtime short acting insulin.

## 2013-07-10 NOTE — Telephone Encounter (Signed)
Pt called and informed.[name]  

## 2013-07-17 ENCOUNTER — Encounter: Payer: Self-pay | Admitting: Family Medicine

## 2013-07-18 ENCOUNTER — Ambulatory Visit (INDEPENDENT_AMBULATORY_CARE_PROVIDER_SITE_OTHER): Payer: Medicare Other | Admitting: Family Medicine

## 2013-07-18 ENCOUNTER — Encounter: Payer: Self-pay | Admitting: Family Medicine

## 2013-07-18 VITALS — BP 126/72 | HR 87 | Wt 188.0 lb

## 2013-07-18 DIAGNOSIS — E1149 Type 2 diabetes mellitus with other diabetic neurological complication: Secondary | ICD-10-CM

## 2013-07-18 DIAGNOSIS — H8110 Benign paroxysmal vertigo, unspecified ear: Secondary | ICD-10-CM

## 2013-07-18 NOTE — Progress Notes (Signed)
  Subjective:    Patient ID: [name], female    DOB: 03-30-1928, 79 y.o.   MRN: [id number]  HPI F/U DM - Brought in a log of home blood sugars today. She does have several in the 60s. All of them are under 130. She is also now on Osphena daily. Will add to med list. No poor wound healing.  Lab Results  Component Value Date   HGBA1C 8.2 05/09/2013    Had a breif episode of vertigo this AM. Says had it years ago. Was sitting and hit all the sudden. Says it was brief but the floor was spinningl N URI sxs , ear pain or pressure or fever of dizziness.   Review of Systems No CP, SOB or change in swelling.      Objective:   Physical Exam  Constitutional: She is oriented to person, place, and time. She appears well-developed and well-nourished.  HENT:  Head: Normocephalic and atraumatic.  Right Ear: External ear normal.  Left Ear: External ear normal.  Nose: Nose normal.  TMs and canals are clear.   Eyes: Conjunctivae and EOM are normal. Pupils are equal, round, and reactive to light.  Neck: Neck supple. No thyromegaly present.  Cardiovascular: Normal rate, regular rhythm and normal heart sounds.   Pulmonary/Chest: Effort normal and breath sounds normal. She has no wheezes.  Lymphadenopathy:    She has no cervical adenopathy.  Neurological: She is alert and oriented to person, place, and time.  Skin: Skin is warm and dry.  Psychiatric: She has a normal mood and affect. Her behavior is normal.          Assessment & Plan:  DM- Still having some hypoglycemia  In the AM but her evening sugars are running 300 but usually forgets to use the novolog at evening meal. Decrease lantus to 30 units. Make sure to use novolog at evening meal so not so high at bedtime. Her daughter will help remind her  Vertigo - most likely BPV.  Call if recurs and can call in meclizine and give h.O on exercises. Ear exam is noral today. Didn't perform a dix-hallpike today.

## 2013-07-27 ENCOUNTER — Telehealth: Payer: Self-pay | Admitting: Family Medicine

## 2013-07-27 NOTE — Telephone Encounter (Signed)
Call patient for her daughter [name]. Have her decrease her Lantus to 24 units at bedtime.

## 2013-07-27 NOTE — Telephone Encounter (Signed)
Called and spoke w/sherry and informed her of change.[name]

## 2013-08-15 ENCOUNTER — Encounter: Payer: Self-pay | Admitting: Family Medicine

## 2013-08-15 ENCOUNTER — Telehealth: Payer: Self-pay | Admitting: Pharmacist Clinician (PhC)/ Clinical Pharmacy Specialist

## 2013-08-15 ENCOUNTER — Ambulatory Visit (INDEPENDENT_AMBULATORY_CARE_PROVIDER_SITE_OTHER): Payer: Medicare Other | Admitting: Family Medicine

## 2013-08-15 VITALS — BP 137/69 | HR 80 | Wt 182.0 lb

## 2013-08-15 DIAGNOSIS — R5381 Other malaise: Secondary | ICD-10-CM

## 2013-08-15 DIAGNOSIS — E1149 Type 2 diabetes mellitus with other diabetic neurological complication: Secondary | ICD-10-CM

## 2013-08-15 DIAGNOSIS — R32 Unspecified urinary incontinence: Secondary | ICD-10-CM

## 2013-08-15 DIAGNOSIS — N950 Postmenopausal bleeding: Secondary | ICD-10-CM

## 2013-08-15 DIAGNOSIS — E119 Type 2 diabetes mellitus without complications: Secondary | ICD-10-CM

## 2013-08-15 MED ORDER — MIRABEGRON ER 25 MG PO TB24: 25.0000 mg | ORAL_TABLET | Freq: Every day | ORAL | Status: DC

## 2013-08-15 NOTE — Progress Notes (Signed)
Subjective:    Patient ID: [name], female    DOB: [birth year], 79 y.o.   MRN: [id number]  HPI DM -she brought in her numbers with her today. She accidentally fell she was supposed to take her Lantus twice a day as it was giving himself 24 units twice a day. When she was doing that her sugars were running around 200 in the evening and as low as the 50s or 60s in the morning. She says she then realized it was a mistake and she was only supposed to be doing it once a day. When she did that then her numbers started going up to 100- 200 in the morning and as high as 300 to 400 in the evening. She still has a problem remembering her evening dose of her mealtime insulin.  Fatigue - she still feels really fatigued and feels that it's getting worse. She's even skip going to church a couple of times.  Has now had 3rd episode of bleeding. Thought from her rectum but wasn't sure. There was no stool mixed in. Has been Dr. Juanda Chance and check out ok. Does have a f/u colonoscopy in December.  Has c/o of low energy. Stil jhas uterus and says no t sure if bleeding is vaginal or not.   Incontinence-she still having a lot of bladder incontinence. She says sometimes she will go shortly urine and not even realize that. She does have cystitis from time to time and recurrent history of urinary tract infections. She is not noticing any dysuria or pelvic pain or pressure currently.  Review of Systems BP 137/69  Pulse 80  Wt 182 lb (82.555 kg)  BMI 29.6 kg/m2    Allergies  Allergen Reactions  . Ace Inhibitors     REACTION: cough  . Fosinopril Sodium     REACTION: Cough  . Sulfamethoxazole W-Trimethoprim     REACTION: Unspecified    Past Medical History  Diagnosis Date  . History of kidney stones   . Atrial fibrillation 1/11     Dr Allyson Sabal  . Ulcer     Gastric  . Esophageal stricture   . Hiatal hernia   . Diarrhea   . Memory loss   . Heart failure     chronic diatolic- 2 D echo 1/08 EF 60%  .  Microalbuminuria   . CAD (coronary artery disease)   . Atherosclerosis   . Anemia     Pernicious  . Diabetes mellitus     w/neuro manifestations, type II  . Chronic kidney disease     chronic, stage III  . Cystitis   . Osteoarthrosis involving multiple sites   . Swelling     limb, left more than right ankle   . Back pain   . Peptic ulcer     unspec. w/o obstruction  . Senile osteoporosis   . IBS (irritable bowel syndrome)   . Hypertension   . Tubular adenoma of colon   . GERD (gastroesophageal reflux disease)   . Heart murmur   . Hyperlipidemia   . Thyroid disease   . Pancreatitis     Past Surgical History  Procedure Laterality Date  . Appendectomy    . Egd- esophageal stricture      gastric ulcer  . 2 d echo  1-08    LVEF 55-60%   . Abdnml lv relaxation, mild avr, rv mildly dilated    . Carotid dopplers- no significant stenosis  2007  . Tonsillectomy and adenoidectomy    .  Moderate calcification of the right coronary and left anterior descending arteries. on ct scan  4-08  . Normal heart cath  2/10    Dr Allyson Sabal  . Cataract extraction      both eyes  . Pacemaker placement  2/11    History   Social History  . Marital Status: Widowed    Spouse Name: N/A    Number of Children: N/A  . Years of Education: N/A   Occupational History  . Not on file.   Social History Main Topics  . Smoking status: Never Smoker   . Smokeless tobacco: Not on file  . Alcohol Use: No  . Drug Use: No  . Sexual Activity: Not on file   Other Topics Concern  . Not on file   Social History Narrative  . No narrative on file    Family History  Problem Relation Age of Onset  . Diabetes Mother   . Prostate cancer Father   . Colon cancer Sister   . Esophageal cancer Neg Hx   . Rectal cancer Neg Hx   . Stomach cancer Neg Hx     Outpatient Encounter Prescriptions as of 08/15/2013  Medication Sig Dispense Refill  . amiodarone (PACERONE) 200 MG tablet TAKE ONE-HALF TO ONE TABLET  BY MOUTH DAILY  90 tablet  2  . aspirin 81 MG tablet Take 325 mg by mouth as needed.       . Calcium Carbonate-Vitamin D 500-125 MG-UNIT TABS Take 1 tablet by mouth 2 (two) times daily with a meal.        . furosemide (LASIX) 40 MG tablet Take 40 mg by mouth daily. Take 1/2 tablet daily      . gabapentin (NEURONTIN) 600 MG tablet Take 1 tablet (600 mg total) by mouth 2 (two) times daily.  60 tablet  11  . glucagon 1 MG injection Inject 1 mg into the vein once as needed. Once as needed if found unresponsive and not able to eat or drink to get sugar up.  1 each  2  . HYDROcodone-acetaminophen (NORCO/VICODIN) 5-325 MG per tablet Take 1 tablet by mouth every 6 (six) hours as needed. Take with food  40 tablet  1  . insulin aspart (NOVOLOG) 100 UNIT/ML injection Inject 10 Units into the skin 3 (three) times daily before meals. Sliding scale only      . insulin glargine (LANTUS) 100 UNIT/ML injection Inject 15 Units into the skin 2 (two) times daily.       . Insulin Syringe-Needle U-100 (EASY COMFORT INSULIN SYRINGE) 30G X 5/16" 0.5 ML MISC by Does not apply route as directed.        Marland Kitchen levothyroxine (SYNTHROID, LEVOTHROID) 100 MCG tablet TAKE ONE TABLET BY MOUTH ONE TIME DAILY  90 tablet  0  . metoprolol tartrate (LOPRESSOR) 25 MG tablet TAKE ONE TABLET BY MOUTH TWICE DAILY  60 tablet  4  . Ospemifene (OSPHENA) 60 MG TABS Take 1 tablet by mouth daily.      . pantoprazole (PROTONIX) 40 MG tablet Take 1 tablet (40 mg total) by mouth daily.  90 tablet  1  . pravastatin (PRAVACHOL) 40 MG tablet TAKE ONE TABLET BY MOUTH NIGHTLY AT BEDTIME  90 tablet  0  . temazepam (RESTORIL) 15 MG capsule Take 1 capsule (15 mg total) by mouth at bedtime as needed. For sleep  90 capsule  1  . warfarin (COUMADIN) 2.5 MG tablet Take 2.5 mg by mouth daily. Tuesday, Thursday, Saturday  and Sunday      . warfarin (COUMADIN) 5 MG tablet Take 5 mg by mouth daily. Monday, Wednesday, Friday      . albuterol (PROAIR HFA) 108 (90 BASE)  MCG/ACT inhaler Inhale 2 puffs into the lungs 2 (two) times daily.  1 Inhaler  1  . mirabegron ER (MYRBETRIQ) 25 MG TB24 tablet Take 1 tablet (25 mg total) by mouth daily.  21 tablet  0   No facility-administered encounter medications on file as of 08/15/2013.          Objective:   Physical Exam  Constitutional: She is oriented to person, place, and time. She appears well-developed and well-nourished.  HENT:  Head: Normocephalic and atraumatic.  Cardiovascular: Normal rate, regular rhythm and normal heart sounds.   Pulmonary/Chest: Effort normal and breath sounds normal.  Neurological: She is alert and oriented to person, place, and time.  Skin: Skin is warm and dry.  Psychiatric: She has a normal mood and affect. Her behavior is normal.          Assessment & Plan:  DM-  well controlled. A1c under 7.5 .  Recommend lantus 15 units BID. I'm wondering if she could be passed metabolites her of the medication. In addition try to remind her to be as consistent as possible with using her evening dose of her mealtime insulin. This definitely affects her sugars at bedtime. If this is still not working well then consider V-GO. Followup in one month. Lab Results  Component Value Date   HGBA1C 7.3 08/15/2013   Urinary incontinence-discussed different treatment options. I would like to give her samples of Myrbetriq 25 mg to try for 3 weeks. She feels like it's helpful then we can consider increasing her dose to 50 mg. Monitor for any blood pressure change. She will followup in one month.  Bleeding-it's difficult to say if this is rectal or vaginal. She herself is not sure. She's now had 3 episodes over the last year and half. She has seen her GI doctor and she is scheduled for colonoscopy in December. I think we would be remiss not to consider the possibility of the bleeding coming from her uterus as she has never had a hysterectomy. I would like to schedule a pelvic ultrasound. If this is  abnormal then I definitely recommend A. endometrial biopsy. One could argue to go ahead and move forward with an endometrial biopsy at this point just to be thorough but she declines at this time. Also consider that the blood could be urinary. This sounds like a very large amout of  blood to be coming from her bladder. Consider sending urine for cytology for further evaluation. She is on Coumadin was certainly puts her at risk of bleeding. She did have a normal CBC with a hemoglobin of 13 approximately 2 months ago.

## 2013-08-17 ENCOUNTER — Telehealth: Payer: Self-pay | Admitting: Pharmacist Clinician (PhC)/ Clinical Pharmacy Specialist

## 2013-08-17 NOTE — Telephone Encounter (Signed)
Spoke with patient, injection scheduled for Sept 10.  Explained ok to hold x 5 days prior.  Will check INR here day of injection for Dr. Ethelene Hal

## 2013-08-17 NOTE — Telephone Encounter (Signed)
Message copied by Rosalee Kaufman on Fri Aug 17, 2013 11:17 AM ------      Message from: Runell Gess      Created: Fri Aug 17, 2013  9:55 AM       Can stop the Coumadin noLovenox bridging is required.      ----- Message -----         From: Marella Bile, RN         Sent: 08/17/2013   9:47 AM           To: Runell Gess, MD                        ----- Message -----         From: Phillips Hay, RPH-CPP         Sent: 08/15/2013   4:23 PM           To: Marella Bile, RN, Runell Gess, MD            Ms Konkle needs to have a spinal injection by Dr. Ethelene Hal at [facility].  Ok to hold warfarin x 5 days prior?  Do you want a lovenox bridge?            Belenda Cruise       ------

## 2013-08-29 ENCOUNTER — Other Ambulatory Visit: Payer: Self-pay | Admitting: Family Medicine

## 2013-08-30 ENCOUNTER — Telehealth: Payer: Self-pay | Admitting: Family Medicine

## 2013-08-30 NOTE — Telephone Encounter (Signed)
Spoke with daughter and patient. Both state she didn't sleep due to nerves. States she couldn't relax when she laid down. They would also like for you to contact the rep about the V-GO to see if it is covered. Informed them of your response.  Meyer Cory, LPN

## 2013-08-30 NOTE — Telephone Encounter (Signed)
Increase fluids. Make sure to take meattim insulin. Does she know why she didn't sleep well? Pain? Cough?

## 2013-08-30 NOTE — Telephone Encounter (Signed)
Please call her daughter [name] at [phone]. Am sugars are good. PM is still high. Does she think she might be interested int the V-GO? The little box that stick on with a sticker that you click to administer the insulin? If so I can contact the rep and they can check with insurance on coverage, etc.

## 2013-08-30 NOTE — Telephone Encounter (Signed)
Pt states she has been shaking since 11:30pm last night and states she has not slept. States BS this am is 234. Please advise.  Meyer Cory, LPN

## 2013-08-31 ENCOUNTER — Other Ambulatory Visit (HOSPITAL_COMMUNITY)
Admission: RE | Admit: 2013-08-31 | Discharge: 2013-08-31 | Disposition: A | Discharge status: Home / Self Care | Payer: Medicare Other | Source: Ambulatory Visit | Attending: Family Medicine | Admitting: Family Medicine

## 2013-08-31 ENCOUNTER — Telehealth: Payer: Self-pay | Admitting: *Deleted

## 2013-08-31 ENCOUNTER — Encounter: Payer: Self-pay | Admitting: Family Medicine

## 2013-08-31 ENCOUNTER — Ambulatory Visit (INDEPENDENT_AMBULATORY_CARE_PROVIDER_SITE_OTHER): Payer: Medicare Other | Admitting: Family Medicine

## 2013-08-31 VITALS — BP 136/69 | HR 99 | Temp 98.7°F | Wt 179.0 lb

## 2013-08-31 DIAGNOSIS — Z124 Encounter for screening for malignant neoplasm of cervix: Secondary | ICD-10-CM | POA: Insufficient documentation

## 2013-08-31 DIAGNOSIS — N950 Postmenopausal bleeding: Secondary | ICD-10-CM

## 2013-08-31 DIAGNOSIS — Z1151 Encounter for screening for human papillomavirus (HPV): Secondary | ICD-10-CM | POA: Insufficient documentation

## 2013-08-31 DIAGNOSIS — R319 Hematuria, unspecified: Secondary | ICD-10-CM

## 2013-08-31 DIAGNOSIS — R509 Fever, unspecified: Secondary | ICD-10-CM

## 2013-08-31 MED ORDER — CIPROFLOXACIN HCL 500 MG PO TABS: 500.0000 mg | ORAL_TABLET | Freq: Two times a day (BID) | ORAL | Status: DC

## 2013-08-31 NOTE — Telephone Encounter (Signed)
Pt called stated that when she went to the bathroom she noticed blood in the stool. Ok to come in to be seen today per Dr.Metheney.Loralee Pacas [place]

## 2013-08-31 NOTE — Progress Notes (Signed)
  Subjective:    Patient ID: [name], female    DOB: 10-12-1928, 79 y.o.   MRN: [id number]  HPI 2 night ago woke up with chills and aches in her bones. She check her temp and says it was 100.4.  Then yesterday same thing.  She checked her temperature again and says it was 104.9. She says she just hasn't felt well. She denies any dysuria or cough or respiratory symptoms. She says initially she had a little bit of sneezing and runny nose but that only lasted one night and then disappeared. She actually says she feels almost 100% better today. She said this afternoon she went to urinate and when she wiped she noticed a large amount of blood. She wasn't sure if it was coming from the bladder, vaginal area or her rectum. She did not have any significant pain or trauma or injury. This is probably the third fourth time that she's passed a large amount of blood in that area. In fact we had scheduled her for ultrasound of the pelvis for further evaluation on August 20. She says she doesn't think she was contacted to actually schedule that. She says she can't do it next Wednesday because she's getting injections in her back.   Review of Systems     Objective:   Physical Exam  Constitutional: She is oriented to person, place, and time. She appears well-developed and well-nourished.  HENT:  Head: Normocephalic and atraumatic.  Genitourinary:    There is no rash on the right labia. There is no rash on the left labia. Uterus is not deviated and not enlarged. Cervix exhibits no motion tenderness, no discharge and no friability. No erythema, tenderness or bleeding around the vagina. No foreign body around the vagina. No signs of injury around the vagina. No vaginal discharge found.  Some blood at the introitus. No blood at the os. No blood at the urethral.  No blood near the rectum.   Neurological: She is alert and oriented to person, place, and time.  Skin: Skin is warm and dry.  Psychiatric: She has a  normal mood and affect. Her behavior is normal.          Assessment & Plan:  Fever of unknown origin-am concerned as she's had a fever for the last couple days she reports that she has not had one today which is reassuring somewhat. We will do a urinalysis. Blood pressure looks well controlled. Her membranes are nice and moist.  Postmenopausal bleeding-unclear to me whether not the bleeding is actually coming from her uterus or possibly the bladder. I did not see any blood directly at the cervical opening or at the opening of the urethra. I did not see any trauma to the labial tissue. Though she did have a V-shaped marked at the end of the clitoris anteriorly. It was not actively bleeding. I did not see any blood around the rectal area so I do not think that this is rectal in nature. We will call and try to get her in as soon as possible next week for a pelvic ultrasound. She may also need cystoscopy by a urologist to take a look at her bladder as well. She is on Coumadin which certainly puts her risk of easy bleeding.  Is actually supposed to hold her Coumadin on Saturday before her back injections on Wednesday.

## 2013-09-01 LAB — CBC WITH DIFFERENTIAL/PLATELET
Basophils Absolute: 0.1 10*3/uL (ref 0.0–0.1)
HCT: 39.1 % (ref 36.0–46.0)
Lymphocytes Relative: 11 % — ABNORMAL LOW (ref 12–46)
Lymphs Abs: 1.6 10*3/uL (ref 0.7–4.0)
Monocytes Absolute: 1 10*3/uL (ref 0.1–1.0)
Neutro Abs: 12.5 10*3/uL — ABNORMAL HIGH (ref 1.7–7.7)
RBC: 4.09 MIL/uL (ref 3.87–5.11)
RDW: 13.6 % (ref 11.5–15.5)
WBC: 15.2 10*3/uL — ABNORMAL HIGH (ref 4.0–10.5)

## 2013-09-03 ENCOUNTER — Other Ambulatory Visit: Payer: Self-pay | Admitting: Family Medicine

## 2013-09-03 ENCOUNTER — Ambulatory Visit (INDEPENDENT_AMBULATORY_CARE_PROVIDER_SITE_OTHER): Payer: Medicare Other | Admitting: Physician Assistant

## 2013-09-03 ENCOUNTER — Ambulatory Visit (INDEPENDENT_AMBULATORY_CARE_PROVIDER_SITE_OTHER): Payer: Medicare Other

## 2013-09-03 ENCOUNTER — Encounter: Payer: Self-pay | Admitting: Physician Assistant

## 2013-09-03 VITALS — BP 128/71 | HR 95 | Temp 98.8°F | Wt 185.0 lb

## 2013-09-03 DIAGNOSIS — R9389 Abnormal findings on diagnostic imaging of other specified body structures: Secondary | ICD-10-CM

## 2013-09-03 DIAGNOSIS — N950 Postmenopausal bleeding: Secondary | ICD-10-CM

## 2013-09-03 DIAGNOSIS — IMO0002 Reserved for concepts with insufficient information to code with codable children: Secondary | ICD-10-CM

## 2013-09-03 DIAGNOSIS — L0390 Cellulitis, unspecified: Secondary | ICD-10-CM

## 2013-09-03 NOTE — Progress Notes (Signed)
  Subjective:    Patient ID: [name], female    DOB: 1928/04/11, 79 y.o.   MRN: [id number]  HPI Patient was seen in ER on 09/01/13 at [facility] for cellulitis of left forearm. She noticed the redness on Thursday and on Friday Dr. Eppie Gibson saw redness on arm but per pt seemed to be getting a little better. She was running a fever but unknown where coming from. She was given vancomycin and sent home with clindamycin every 6 hours. Pt is taking medication with no difficultly. Her arm continues to improve. She has a spinal injection due for Wednesday and wants to make sure ok to go. She has no appetitie. Fever has not been present since Saturday. No fever today. CBC actually went down from 15.7 to 8.7(at ED). UA was negative and Cipro was stopped.    Per pt vaginal bleeding has stopped but does have vaginal u/s appt today.    Review of Systems     Objective:   Physical Exam  Constitutional: She is oriented to person, place, and time. She appears well-developed and well-nourished.  Cardiovascular: Normal rate, regular rhythm and normal heart sounds.   Pulmonary/Chest: Effort normal and breath sounds normal. She has no wheezes.  Neurological: She is alert and oriented to person, place, and time.  Skin:  Left forearm hematoma still present with erythema still present inside hematoma. Warm to the touch. Redness does not extend outside hematoma are which via picture was noted to improve drastically. Swelling is minimal and has also decreased significantly from picture.   Psychiatric: She has a normal mood and affect. Her behavior is normal.          Assessment & Plan:  Cellulitis- If not running and fever and continuing to improve ok for spinal injection. Reassured pt and daughter that looked much better. Daughter had pictures from ED visit to show. Finish all abx. Call if any symptoms rapidly worsen. Encouraged pt to eat and if not eat, drink calories to keep strength up.

## 2013-09-05 ENCOUNTER — Encounter: Payer: Self-pay | Admitting: *Deleted

## 2013-09-05 ENCOUNTER — Ambulatory Visit (INDEPENDENT_AMBULATORY_CARE_PROVIDER_SITE_OTHER): Payer: Medicare Other | Admitting: Pharmacist Clinician (PhC)/ Clinical Pharmacy Specialist

## 2013-09-05 DIAGNOSIS — I4891 Unspecified atrial fibrillation: Secondary | ICD-10-CM

## 2013-09-05 DIAGNOSIS — Z7901 Long term (current) use of anticoagulants: Secondary | ICD-10-CM

## 2013-09-05 LAB — POCT INR: INR: 2.7

## 2013-09-06 NOTE — Telephone Encounter (Signed)
Opened in error

## 2013-09-15 ENCOUNTER — Encounter: Payer: Self-pay | Admitting: Internal Medicine

## 2013-09-19 ENCOUNTER — Ambulatory Visit (INDEPENDENT_AMBULATORY_CARE_PROVIDER_SITE_OTHER): Payer: Medicare Other | Admitting: Family Medicine

## 2013-09-19 ENCOUNTER — Ambulatory Visit (INDEPENDENT_AMBULATORY_CARE_PROVIDER_SITE_OTHER): Payer: Medicare Other | Admitting: Obstetrics & Gynecology

## 2013-09-19 ENCOUNTER — Other Ambulatory Visit: Payer: Self-pay | Admitting: Family Medicine

## 2013-09-19 ENCOUNTER — Encounter: Payer: Self-pay | Admitting: Obstetrics & Gynecology

## 2013-09-19 ENCOUNTER — Encounter: Payer: Self-pay | Admitting: Family Medicine

## 2013-09-19 VITALS — BP 151/76 | HR 92 | Resp 16 | Ht 67.0 in | Wt 184.0 lb

## 2013-09-19 VITALS — BP 120/72 | HR 86 | Wt 184.0 lb

## 2013-09-19 DIAGNOSIS — R6881 Early satiety: Secondary | ICD-10-CM

## 2013-09-19 DIAGNOSIS — E1149 Type 2 diabetes mellitus with other diabetic neurological complication: Secondary | ICD-10-CM

## 2013-09-19 DIAGNOSIS — N318 Other neuromuscular dysfunction of bladder: Secondary | ICD-10-CM

## 2013-09-19 DIAGNOSIS — J309 Allergic rhinitis, unspecified: Secondary | ICD-10-CM

## 2013-09-19 DIAGNOSIS — R319 Hematuria, unspecified: Secondary | ICD-10-CM

## 2013-09-19 DIAGNOSIS — Z23 Encounter for immunization: Secondary | ICD-10-CM

## 2013-09-19 DIAGNOSIS — N3281 Overactive bladder: Secondary | ICD-10-CM

## 2013-09-19 DIAGNOSIS — N950 Postmenopausal bleeding: Secondary | ICD-10-CM

## 2013-09-19 LAB — POCT UA - MICROALBUMIN
Creatinine, POC: 300 mg/dL
Microalbumin Ur, POC: 150 mg/L

## 2013-09-19 LAB — POCT URINALYSIS DIPSTICK
Protein, UA: 30
Spec Grav, UA: >=1.03
Urobilinogen, UA: 0.2

## 2013-09-19 MED ORDER — MISOPROSTOL 200 MCG PO TABS: ORAL_TABLET | ORAL | Status: DC

## 2013-09-19 NOTE — Progress Notes (Signed)
Subjective:    Patient ID: [name], female    DOB: [birth year], 79 y.o.   MRN: [id number]  HPI No more uterine bleeding episdoes.  She was originally scheduled for biopsy today but they have decided to put her due to Cytotec overnight and do it at the hospital next week. They felt like this might be more comfortable for her.  Says the myrbetriq caused her to have difficulty urinating for 2 days so stopped it.  She's not currently taking anything but sister urinating has been a little better.  2 weeks of phlegm in her chest and cough. Nose has been running. No fever or temp.  Throat has been itching. Occ takes zyrtec.  No GI sxs.  No SOB.  She says her symptoms seem to be worse in the morning and get better as the day goes on. She does notice that she get some temporary relief with Zyrtec.  DM- Sugars in AM running 102-322 and in the evening 302-480.   She's currently using Lantus 15 units twice a day and occasional tremors to use her NovoLog but rarely. We previously discussed starting the V-Go insulin system. The representative has not contacted her yet.  Review of Systems  BP 120/72  Pulse 86  Wt 184 lb (83.462 kg)  BMI 28.81 kg/m2    Allergies  Allergen Reactions  . Ace Inhibitors     REACTION: cough  . Fosinopril Sodium     REACTION: Cough  . Sulfamethoxazole W-Trimethoprim     REACTION: Unspecified    Past Medical History  Diagnosis Date  . History of kidney stones   . Atrial fibrillation 1/11     Dr Allyson Sabal  . Ulcer     Gastric  . Esophageal stricture   . Hiatal hernia   . Diarrhea   . Memory loss   . Heart failure     chronic diatolic- 2 D echo 1/08 EF 60%  . Microalbuminuria   . CAD (coronary artery disease)   . Atherosclerosis   . Anemia     Pernicious  . Diabetes mellitus     w/neuro manifestations, type II  . Chronic kidney disease     chronic, stage III  . Cystitis   . Osteoarthrosis involving multiple sites   . Swelling     limb, left more  than right ankle   . Back pain   . Peptic ulcer     unspec. w/o obstruction  . Senile osteoporosis   . IBS (irritable bowel syndrome)   . Hypertension   . Tubular adenoma of colon   . GERD (gastroesophageal reflux disease)   . Heart murmur   . Hyperlipidemia   . Thyroid disease   . Pancreatitis     Past Surgical History  Procedure Laterality Date  . Appendectomy    . Egd- esophageal stricture      gastric ulcer  . 2 d echo  1-08    LVEF 55-60%   . Abdnml lv relaxation, mild avr, rv mildly dilated    . Carotid dopplers- no significant stenosis  2007  . Tonsillectomy and adenoidectomy    . Moderate calcification of the right coronary and left anterior descending arteries. on ct scan  4-08  . Normal heart cath  2/10    Dr Allyson Sabal  . Cataract extraction      both eyes  . Pacemaker placement  2/11    History   Social History  . Marital Status: Widowed  Spouse Name: N/A    Number of Children: N/A  . Years of Education: N/A   Occupational History  . Not on file.   Social History Main Topics  . Smoking status: Never Smoker   . Smokeless tobacco: Not on file  . Alcohol Use: No  . Drug Use: No  . Sexual Activity: No   Other Topics Concern  . Not on file   Social History Narrative  . No narrative on file    Family History  Problem Relation Age of Onset  . Diabetes Mother   . Prostate cancer Father   . Colon cancer Sister   . Esophageal cancer Neg Hx   . Rectal cancer Neg Hx   . Stomach cancer Neg Hx     Outpatient Encounter Prescriptions as of 09/19/2013  Medication Sig Dispense Refill  . amiodarone (PACERONE) 200 MG tablet TAKE ONE-HALF TO ONE TABLET BY MOUTH DAILY  90 tablet  2  . aspirin 81 MG tablet Take 325 mg by mouth as needed.       . Calcium Carbonate-Vitamin D 500-125 MG-UNIT TABS Take 1 tablet by mouth 2 (two) times daily with a meal.        . furosemide (LASIX) 40 MG tablet Take 40 mg by mouth daily. Take 1/2 tablet daily      . gabapentin  (NEURONTIN) 600 MG tablet Take 1 tablet (600 mg total) by mouth 2 (two) times daily.  60 tablet  11  . glucagon 1 MG injection Inject 1 mg into the vein once as needed. Once as needed if found unresponsive and not able to eat or drink to get sugar up.  1 each  2  . HYDROcodone-acetaminophen (NORCO/VICODIN) 5-325 MG per tablet Take 1 tablet by mouth every 6 (six) hours as needed. Take with food  40 tablet  1  . insulin aspart (NOVOLOG) 100 UNIT/ML injection Inject 10 Units into the skin 3 (three) times daily before meals. Sliding scale only      . insulin glargine (LANTUS) 100 UNIT/ML injection Inject 15 Units into the skin 2 (two) times daily.       . Insulin Syringe-Needle U-100 (EASY COMFORT INSULIN SYRINGE) 30G X 5/16" 0.5 ML MISC by Does not apply route as directed.        Marland Kitchen levothyroxine (SYNTHROID, LEVOTHROID) 100 MCG tablet TAKE ONE TABLET BY MOUTH ONE TIME DAILY  90 tablet  0  . metoprolol tartrate (LOPRESSOR) 25 MG tablet TAKE ONE TABLET BY MOUTH TWICE DAILY  60 tablet  4  . misoprostol (CYTOTEC) 200 MCG tablet Take 3 pills by mouth the night prior to endometrial biopsy.  3 tablet  0  . Ospemifene (OSPHENA) 60 MG TABS Take 1 tablet by mouth daily.      . pantoprazole (PROTONIX) 40 MG tablet Take 1 tablet (40 mg total) by mouth daily.  90 tablet  1  . pravastatin (PRAVACHOL) 40 MG tablet Take one tablet by mouth   nightly at bedtime  90 tablet  0  . temazepam (RESTORIL) 15 MG capsule Take 1 capsule (15 mg total) by mouth at bedtime as needed. For sleep  90 capsule  1  . warfarin (COUMADIN) 2.5 MG tablet Take 2.5 mg by mouth daily. Tuesday, Thursday, Saturday and Sunday      . warfarin (COUMADIN) 5 MG tablet Take 5 mg by mouth daily. Monday, Wednesday, Friday      . [DISCONTINUED] mirabegron ER (MYRBETRIQ) 25 MG TB24 tablet Take 1  tablet (25 mg total) by mouth daily.  21 tablet  0  . albuterol (PROAIR HFA) 108 (90 BASE) MCG/ACT inhaler Inhale 2 puffs into the lungs 2 (two) times daily.  1  Inhaler  1   No facility-administered encounter medications on file as of 09/19/2013.          Objective:   Physical Exam  Constitutional: She is oriented to person, place, and time. She appears well-developed and well-nourished.  HENT:  Head: Normocephalic and atraumatic.  Right Ear: External ear normal.  Left Ear: External ear normal.  Nose: Nose normal.  Mouth/Throat: Oropharynx is clear and moist.  TMs and canals are clear.   Eyes: Conjunctivae and EOM are normal. Pupils are equal, round, and reactive to light.  Neck: Neck supple. No thyromegaly present.  Cardiovascular: Normal rate, regular rhythm and normal heart sounds.   Pulmonary/Chest: Effort normal and breath sounds normal. She has no wheezes.  Lymphadenopathy:    She has no cervical adenopathy.  Neurological: She is alert and oriented to person, place, and time.  Skin: Skin is warm and dry.  Psychiatric: She has a normal mood and affect.          Assessment & Plan:  DM - sugars are quite elevated even her morning sugars which were previously dropping too low. I'm going to increase her morning Lantus to 20 units and keep the evening dose of 15. Encouraged her to still use her NovoLog as much as she can't remembers. I also gave her some samples of Apidra Triacet if she's REE and then remembers to take her insulin she can still dose herself. It is a very small meal she can use 5 units and affect a larger meal she can use 10 minutes.  Early satiety-she said she her appetite has been really down over the last month or 2. She says she typically eats or 5 bites and doesn't want to eat anymore. She said she was nauseated while taking the antibiotics and the nausea has improved but still not wanting to eat and feeling full very quickly. I recommended referral to GI. She says she wants to hold off until she has her endometrial biopsy performed and then consider referral to GI.  Allergic rhinitis-discussed the best treatment  would be to take her Zyrtec regularly at least for the next 6-8 weeks control her symptoms. If she still needs additional relief then we can add a nasal steroid spray. She can call me next week if she feels that we need to add something to her regimen.  Microalbuminuria-we did have a discussion today about the recommendation for an ACE inhibitor or an arm. She has been intolerant to ACE inhibitors because of cough. We discussed the risks and benefits. Her blood pressures are a little on the low side based on her age I do worry some about the potential drop in blood pressure with added the addition of an ARB agent. After discussion with her and her daughter we opted not to treat with an ARB at this time. We will certainly continue to monitor the area and is the levels of protein or increasing then we may readdress this issue.  Urinary uregency - will hold off on any prescription medications at this time since she's a little bit better with it. If she changes her mind we can always try something like Detrol.

## 2013-09-19 NOTE — Patient Instructions (Signed)
Place endometrial biopsy patient instructions here.  

## 2013-09-19 NOTE — Progress Notes (Signed)
  Subjective:    Patient ID: [name], female    DOB: Sep 25, 1928, 79 y.o.   MRN: [id number]  HPI  79 yo WW P2 (105 and 41 yo kids) who is referred here by Dr. Linford Arnold for PMB which started about a year ago. This is her first evaluation for this problem.  Review of Systems     Objective:   Physical Exam        Assessment & Plan:  PMB- I offered her pretreatment with cytotec versus trying today (understanding that it will be easier with the cytotec). She prefers pretreatment.

## 2013-09-19 NOTE — Patient Instructions (Signed)
Increase lantus to 20 units in the AM and 15 at night.

## 2013-09-21 ENCOUNTER — Telehealth: Payer: Self-pay

## 2013-09-21 NOTE — Telephone Encounter (Signed)
[name] can not afford the in home service for the insulin. She needs it cancelled. Do you know the name of the company? Patient does not know.

## 2013-09-21 NOTE — Telephone Encounter (Signed)
There is not in-home service. It is V-GO. The repe was going to set her up with diabetic coordinator here in our office to show her how to use the device.

## 2013-09-21 NOTE — Telephone Encounter (Signed)
Patient advised.

## 2013-09-25 ENCOUNTER — Ambulatory Visit (INDEPENDENT_AMBULATORY_CARE_PROVIDER_SITE_OTHER): Payer: Medicare Other | Admitting: Pharmacist Clinician (PhC)/ Clinical Pharmacy Specialist

## 2013-09-25 ENCOUNTER — Other Ambulatory Visit: Payer: Self-pay | Admitting: Cardiovascular Disease

## 2013-09-25 DIAGNOSIS — Z7901 Long term (current) use of anticoagulants: Secondary | ICD-10-CM

## 2013-09-25 DIAGNOSIS — I4891 Unspecified atrial fibrillation: Secondary | ICD-10-CM

## 2013-09-25 LAB — PROTIME-INR
INR: 1.87 — ABNORMAL HIGH (ref <1.50)
Prothrombin Time: 21 seconds — ABNORMAL HIGH (ref 11.6–15.2)

## 2013-09-25 LAB — LIPID PANEL
Cholesterol: 131 mg/dL (ref 0–200)
HDL: 49 mg/dL (ref >39)
Total CHOL/HDL Ratio: 2.7 Ratio
Triglycerides: 80 mg/dL (ref <150)

## 2013-09-26 ENCOUNTER — Ambulatory Visit (INDEPENDENT_AMBULATORY_CARE_PROVIDER_SITE_OTHER): Payer: Medicare Other | Admitting: Obstetrics & Gynecology

## 2013-09-26 ENCOUNTER — Encounter: Payer: Self-pay | Admitting: Obstetrics & Gynecology

## 2013-09-26 VITALS — BP 156/70 | HR 80 | Resp 16 | Ht 67.0 in | Wt 185.0 lb

## 2013-09-26 DIAGNOSIS — N950 Postmenopausal bleeding: Secondary | ICD-10-CM

## 2013-09-26 NOTE — Patient Instructions (Signed)

## 2013-10-04 ENCOUNTER — Ambulatory Visit (INDEPENDENT_AMBULATORY_CARE_PROVIDER_SITE_OTHER): Payer: Medicare Other | Admitting: Obstetrics & Gynecology

## 2013-10-04 ENCOUNTER — Other Ambulatory Visit: Payer: Self-pay | Admitting: Family Medicine

## 2013-10-04 ENCOUNTER — Encounter: Payer: Self-pay | Admitting: Obstetrics & Gynecology

## 2013-10-04 VITALS — BP 160/79 | HR 88 | Resp 16 | Ht 67.0 in | Wt 183.0 lb

## 2013-10-04 DIAGNOSIS — N950 Postmenopausal bleeding: Secondary | ICD-10-CM

## 2013-10-04 DIAGNOSIS — R300 Dysuria: Secondary | ICD-10-CM

## 2013-10-04 MED ORDER — NITROFURANTOIN MONOHYD MACRO 100 MG PO CAPS: 100.0000 mg | ORAL_CAPSULE | Freq: Two times a day (BID) | ORAL | Status: DC

## 2013-10-04 MED ORDER — FLAVOXATE HCL 100 MG PO TABS: 100.0000 mg | ORAL_TABLET | Freq: Three times a day (TID) | ORAL | Status: DC | PRN

## 2013-10-04 NOTE — Patient Instructions (Signed)
Urinary Tract Infection °A urinary tract infection (UTI) can occur any place along the urinary tract. The tract includes the kidneys, ureters, bladder, and urethra. A type of germ called bacteria often causes a UTI. UTIs are often helped with antibiotic medicine.  °HOME CARE  °· If given, take antibiotics as told by your doctor. Finish them even if you start to feel better. °· Drink enough fluids to keep your pee (urine) clear or pale yellow. °· Avoid tea, drinks with caffeine, and bubbly (carbonated) drinks. °· Pee often. Avoid holding your pee in for a long time. °· Pee before and after having sex (intercourse). °· Wipe from front to back after you poop (bowel movement) if you are a woman. Use each tissue only once. °GET HELP RIGHT AWAY IF:  °· You have back pain. °· You have lower belly (abdominal) pain. °· You have chills. °· You feel sick to your stomach (nauseous). °· You throw up (vomit). °· Your burning or discomfort with peeing does not go away. °· You have a fever. °· Your symptoms are not better in 3 days. °MAKE SURE YOU:  °· Understand these instructions. °· Will watch your condition. °· Will get help right away if you are not doing well or get worse. °Document Released: 05/31/2008 Document Revised: 09/06/2012 Document Reviewed: 07/13/2012 °[employer]® Patient Information ©2014 [employer]. ° °

## 2013-10-04 NOTE — Progress Notes (Signed)
  Subjective:    Patient ID: [name], female    DOB: 1928-01-24, 79 y.o.   MRN: [id number]  HPI  79 yo lady who is here for follow up of her EMBX results. Her PMB has stopped. Her pathol was normal (polyp). She understands that if PMB recurrs I will rec a d&c/H/S.  She complains of a 1 day h/o vaginal pain with urination. She says this feels like when she had "cystitis".   Review of Systems     Objective:   Physical Exam  No CVAT       Assessment & Plan:  Check UA and UC&S Prescribe urispas and macrobid

## 2013-10-09 NOTE — Progress Notes (Signed)
  Subjective:    Patient ID: [name], female    DOB: 1928-06-13, 79 y.o.   MRN: [id number]  HPI  She is here today for a EMBX for PMB.  Review of Systems     Objective:   Physical Exam   UPT negative, consent signed, time out done Cervix prepped with betadine and grasped with a single tooth tenaculum Uterus sounded to 9 cm Pipelle used for 2 passes with a moderate amount of tissue obtained. She tolerated the procedure well.       Assessment & Plan:  PMB- await EMBX results

## 2013-10-16 ENCOUNTER — Encounter: Payer: Self-pay | Admitting: Cardiology

## 2013-10-17 ENCOUNTER — Encounter: Payer: Self-pay | Admitting: Cardiology

## 2013-10-17 ENCOUNTER — Ambulatory Visit (INDEPENDENT_AMBULATORY_CARE_PROVIDER_SITE_OTHER): Payer: Medicare Other | Admitting: Cardiology

## 2013-10-17 VITALS — BP 110/60 | Ht 67.0 in | Wt 185.0 lb

## 2013-10-17 DIAGNOSIS — Z8679 Personal history of other diseases of the circulatory system: Secondary | ICD-10-CM

## 2013-10-17 DIAGNOSIS — E039 Hypothyroidism, unspecified: Secondary | ICD-10-CM

## 2013-10-17 DIAGNOSIS — R0609 Other forms of dyspnea: Secondary | ICD-10-CM

## 2013-10-17 DIAGNOSIS — Z950 Presence of cardiac pacemaker: Secondary | ICD-10-CM

## 2013-10-17 DIAGNOSIS — Z7901 Long term (current) use of anticoagulants: Secondary | ICD-10-CM

## 2013-10-17 DIAGNOSIS — I480 Paroxysmal atrial fibrillation: Secondary | ICD-10-CM | POA: Insufficient documentation

## 2013-10-17 DIAGNOSIS — I4891 Unspecified atrial fibrillation: Secondary | ICD-10-CM

## 2013-10-17 DIAGNOSIS — I10 Essential (primary) hypertension: Secondary | ICD-10-CM

## 2013-10-17 LAB — PACEMAKER DEVICE OBSERVATION

## 2013-10-17 NOTE — Assessment & Plan Note (Signed)
Medtronic Adapta device was interrogated.  One episode of episodic atrial fib, over 5 hour period but that was Sept a year ago.  Pacemaker DDDR mode, atrially paced 63% of time, V paced 65% of the time.  Impedence A/V stable.  EKG pacing.

## 2013-10-17 NOTE — Progress Notes (Signed)
10/17/2013   PCP: Nani Gasser, MD   Chief Complaint  Patient presents with  . Follow-up    6 month visit    Primary Cardiologist: Dr. Allyson Sabal  HPI: 79 year old female mother of 2 and grandmother to 2 who has [name] here with [name] today is being seen for six-month followup.  2010 she heart catheterization with normal coronary arteries and normal LV function. She has a history of complete heart block and paroxysmal A. fib and underwent permanent pacemaker with a Medtronic device. She had not had [name] pacer interrogated recently we did interrogated today and she will do [facility] quarterly remote checks thereafter. Other history includes hypertension and hyperlipidemia pulmonary hypertension as well. She continues to lack of energy dyspnea on exertion we did a 2-D last spring that was normal. BMP was normal thyroid functions were normal. She tells me today that shortness of breath with exertion and fatigue have increased and continue.    She denies any chest pain. [name] confirms that she does much activity at all she is extremely short of breath she rests and [name] symptoms improved.  I want [name] around the unit several times with pulse ox [name] rate as high as 104 and [name] sats remained 96-98%. Just had [name] pacemaker interrogated prior pacemaker rep today she's had no atrial fib except for 1 year ago she had several brief episodes.    Recently she had hematuria/vaginal bleeding uterine ultrasound revealed thickening uterine wall she underwent uterine biopsy which came back normal.  [name] hemoglobin in September was normal she denies any bleeding and the symptoms have continued over the last 9-6 months. [name] last thyroid was also normal. She had a chest x-ray in June and again this was normal.  Allergies  Allergen Reactions  . Ace Inhibitors     REACTION: cough  . Fosinopril Sodium     REACTION: Cough  . Sulfamethoxazole-Trimethoprim     REACTION: Unspecified     Current Outpatient Prescriptions  Medication Sig Dispense Refill  . amiodarone (PACERONE) 200 MG tablet TAKE ONE-HALF TO ONE TABLET BY MOUTH DAILY  90 tablet  2  . aspirin 81 MG tablet Take 325 mg by mouth as needed.       . Calcium Carbonate-Vitamin D 500-125 MG-UNIT TABS Take 1 tablet by mouth 2 (two) times daily with a meal.        . flavoxATE (URISPAS) 100 MG tablet Take 1 tablet (100 mg total) by mouth 3 (three) times daily as needed (bladder spasm).  10 tablet  1  . furosemide (LASIX) 40 MG tablet Take 40 mg by mouth daily. Take 1/2 tablet daily      . gabapentin (NEURONTIN) 600 MG tablet Take 1 tablet (600 mg total) by mouth 2 (two) times daily.  60 tablet  11  . glucagon 1 MG injection Inject 1 mg into the vein once as needed. Once as needed if found unresponsive and not able to eat or drink to get sugar up.  1 each  2  . HYDROcodone-acetaminophen (NORCO/VICODIN) 5-325 MG per tablet Take 1 tablet by mouth every 6 (six) hours as needed. Take with food  40 tablet  1  . insulin aspart (NOVOLOG) 100 UNIT/ML injection Inject 10 Units into the skin 3 (three) times daily before meals. Sliding scale only      . insulin glargine (LANTUS) 100 UNIT/ML injection Inject 15 Units into the skin 2 (two) times daily.       Marland Kitchen  Insulin Syringe-Needle U-100 (EASY COMFORT INSULIN SYRINGE) 30G X 5/16" 0.5 ML MISC by Does not apply route as directed.        Marland Kitchen levothyroxine (SYNTHROID, LEVOTHROID) 100 MCG tablet Take one tablet by mouth one time daily  90 tablet  1  . metoprolol tartrate (LOPRESSOR) 25 MG tablet TAKE ONE TABLET BY MOUTH TWICE DAILY  60 tablet  4  . misoprostol (CYTOTEC) 200 MCG tablet Take 3 pills by mouth the night prior to endometrial biopsy.  3 tablet  0  . nitrofurantoin, macrocrystal-monohydrate, (MACROBID) 100 MG capsule Take 1 capsule (100 mg total) by mouth 2 (two) times daily.  14 capsule  1  . Ospemifene (OSPHENA) 60 MG TABS Take 1 tablet by mouth daily.      . pantoprazole  (PROTONIX) 40 MG tablet Take one tablet by mouth one time daily  90 tablet  0  . pravastatin (PRAVACHOL) 40 MG tablet Take one tablet by mouth   nightly at bedtime  90 tablet  0  . temazepam (RESTORIL) 15 MG capsule Take 1 capsule (15 mg total) by mouth at bedtime as needed. For sleep  90 capsule  1  . warfarin (COUMADIN) 2.5 MG tablet Take 2.5 mg by mouth daily. Tuesday, Thursday, Saturday and Sunday      . warfarin (COUMADIN) 5 MG tablet Take 5 mg by mouth daily. Monday, Wednesday, Friday      . albuterol (PROAIR HFA) 108 (90 BASE) MCG/ACT inhaler Inhale 2 puffs into the lungs 2 (two) times daily.  1 Inhaler  1   No current facility-administered medications for this visit.    Past Medical History  Diagnosis Date  . History of kidney stones   . Atrial fibrillation 1/11     Dr Allyson Sabal  . Ulcer     Gastric  . Esophageal stricture   . Hiatal hernia   . Diarrhea   . Memory loss   . Heart failure     chronic diatolic- 2 D echo 1/08 EF 60%  . Microalbuminuria   . Anemia     Pernicious  . Diabetes mellitus     w/neuro manifestations, type II  . Chronic kidney disease     chronic, stage III  . Cystitis   . Osteoarthrosis involving multiple sites   . Swelling     limb, left more than right ankle   . Back pain   . Peptic ulcer     unspec. w/o obstruction  . Senile osteoporosis   . IBS (irritable bowel syndrome)   . Hypertension   . Tubular adenoma of colon   . GERD (gastroesophageal reflux disease)   . Heart murmur   . Hyperlipidemia   . Thyroid disease   . Pancreatitis   . H/O cardiac catheterization 2010    normal coronary arteries, EF 60%  . Hx of echocardiogram 04/04/13    EF 55-60%, mild MR  . PAF (paroxysmal atrial fibrillation)     Last DCCV 12/29/2009  . Chronic anticoagulation     on coumadin  . Symptomatic bradycardia 01/2010    CHB, temp pacer and then Medtronic PPM  . RBBB     Past Surgical History  Procedure Laterality Date  . Appendectomy    . Egd-  esophageal stricture      gastric ulcer  . Carotid dopplers- no significant stenosis  2007  . Tonsillectomy and adenoidectomy    . Cataract extraction      both eyes  . Pacemaker placement  2/11    medtronic dual chamber  . Cardiac catheterization  2010    normal coronary arteries    LKG:[id number]:ZD colds or fevers, no weight changes Skin:no rashes or ulcers HEENT:no blurred vision, no congestion CV:see HPI PUL:see HPI GI:no diarrhea constipation or melena, no indigestion GU:no hematuria, no dysuria-recent uterine biopsy for bleeding - negative results. MS:no joint pain, no claudication Neuro:no syncope, no lightheadedness Endo:+ diabetes, + thyroid disease  PHYSICAL EXAM BP 110/60  Ht 5\' 7"  (1.702 m)  Wt 185 lb (83.915 kg)  BMI 28.97 kg/m2 General:Pleasant affect, NAD Skin:Warm and dry, brisk capillary refill, discoloration of upper extremities, brawny color HEENT:normocephalic, sclera clear, mucus membranes moist Neck:supple, no JVD, no bruits  Heart:S1S2 RRR with 1/6 systolic murmur, no  gallup, rub or click Lungs:clear without rales, rhonchi, or wheezes GUY:QIHK, non tender, + BS, do not palpate liver spleen or masses Ext:tr chronic lower ext edema, 1+ pedal pulses, 2+ radial pulses Neuro:alert and oriented, MAE, follows commands, + facial symmetry  VQQ:VZDGLO sensing, ventricular pacing.    ASSESSMENT AND PLAN DOE (dyspnea on exertion) Increasing DOE, at home able to do very little before SOB and very weak.  She rests and symptoms improve.  SP02 here with walking 3 times around nurses station was 98%.  HR was 104.  She was only mildly SOB at that time.  Will check Lexiscan myoview to rule out ischemia.  She could not walk on treadmill.  No arrhthymias to account for the SOB.  One episode of PAF and that was a year ago.     Pacemaker Medtronic Adapta device was interrogated.  One episode of episodic atrial fib, over 5 hour period but that was Sept a year ago.   Pacemaker DDDR mode, atrially paced 63% of time, V paced 65% of the time.  Impedence A/V stable.  EKG pacing.  HYPERTENSION, BENIGN SYSTEMIC controlled  PAF (paroxysmal atrial fibrillation) Only episode 1 year ago.  UNSPECIFIED HYPOTHYROIDISM TSH was WNL in June and she was having symptoms at that time.  Chronic anticoagulation, on coumadin for PAF Followed by our office, H/H have been stable.   If stress test is normal we could do pulmonary function studies with diffusing capacities to see if amiodarone is causing some of the symptoms patient will followup with me once the test is complete.

## 2013-10-17 NOTE — Assessment & Plan Note (Signed)
TSH was WNL in June and she was having symptoms at that time.

## 2013-10-17 NOTE — Assessment & Plan Note (Signed)
Only episode 1 year ago.

## 2013-10-17 NOTE — Assessment & Plan Note (Signed)
controlled 

## 2013-10-17 NOTE — Assessment & Plan Note (Signed)
Increasing DOE, at home able to do very little before SOB and very weak.  She rests and symptoms improve.  SP02 here with walking 3 times around nurses station was 98%.  HR was 104.  She was only mildly SOB at that time.  Will check Lexiscan myoview to rule out ischemia.  She could not walk on treadmill.  No arrhthymias to account for the SOB.  One episode of PAF and that was a year ago.

## 2013-10-17 NOTE — Assessment & Plan Note (Signed)
Followed by our office, H/H have been stable.

## 2013-10-17 NOTE — Patient Instructions (Signed)
We will do stress test just to check if heart disease is causing the shortness of breath.    I will see you back for results.  Call if problems in the meantime, such as chest pain or worsening shortness of breath.

## 2013-10-18 ENCOUNTER — Encounter: Payer: Self-pay | Admitting: Cardiology

## 2013-10-19 ENCOUNTER — Other Ambulatory Visit: Payer: Self-pay | Admitting: Cardiovascular Disease

## 2013-10-19 LAB — PROTIME-INR
INR: 3.45 — ABNORMAL HIGH (ref <1.50)
Prothrombin Time: 33.2 seconds — ABNORMAL HIGH (ref 11.6–15.2)

## 2013-10-22 ENCOUNTER — Ambulatory Visit (INDEPENDENT_AMBULATORY_CARE_PROVIDER_SITE_OTHER): Payer: Medicare Other | Admitting: Pharmacist Clinician (PhC)/ Clinical Pharmacy Specialist

## 2013-10-22 DIAGNOSIS — Z7901 Long term (current) use of anticoagulants: Secondary | ICD-10-CM

## 2013-10-22 LAB — PACEMAKER DEVICE OBSERVATION
AL AMPLITUDE: 2 mv
AL THRESHOLD: 0.5 V
ATRIAL PACING PM: 63.1
BAMS-0001: 175 {beats}/min
RV LEAD IMPEDENCE PM: 697 Ohm

## 2013-10-24 ENCOUNTER — Ambulatory Visit (HOSPITAL_COMMUNITY)
Admission: RE | Admit: 2013-10-24 | Discharge: 2013-10-24 | Disposition: A | Discharge status: Home / Self Care | Payer: Medicare Other | Source: Ambulatory Visit | Attending: Cardiovascular Disease | Admitting: Cardiovascular Disease

## 2013-10-24 DIAGNOSIS — E119 Type 2 diabetes mellitus without complications: Secondary | ICD-10-CM | POA: Insufficient documentation

## 2013-10-24 DIAGNOSIS — R55 Syncope and collapse: Secondary | ICD-10-CM | POA: Insufficient documentation

## 2013-10-24 DIAGNOSIS — I10 Essential (primary) hypertension: Secondary | ICD-10-CM | POA: Insufficient documentation

## 2013-10-24 DIAGNOSIS — Z8249 Family history of ischemic heart disease and other diseases of the circulatory system: Secondary | ICD-10-CM | POA: Insufficient documentation

## 2013-10-24 DIAGNOSIS — R0989 Other specified symptoms and signs involving the circulatory and respiratory systems: Secondary | ICD-10-CM

## 2013-10-24 DIAGNOSIS — R42 Dizziness and giddiness: Secondary | ICD-10-CM | POA: Insufficient documentation

## 2013-10-24 DIAGNOSIS — Z950 Presence of cardiac pacemaker: Secondary | ICD-10-CM | POA: Insufficient documentation

## 2013-10-24 DIAGNOSIS — Z8673 Personal history of transient ischemic attack (TIA), and cerebral infarction without residual deficits: Secondary | ICD-10-CM | POA: Insufficient documentation

## 2013-10-24 DIAGNOSIS — I4510 Unspecified right bundle-branch block: Secondary | ICD-10-CM | POA: Insufficient documentation

## 2013-10-24 DIAGNOSIS — R5381 Other malaise: Secondary | ICD-10-CM | POA: Insufficient documentation

## 2013-10-24 DIAGNOSIS — Z8679 Personal history of other diseases of the circulatory system: Secondary | ICD-10-CM

## 2013-10-24 DIAGNOSIS — R0609 Other forms of dyspnea: Secondary | ICD-10-CM | POA: Insufficient documentation

## 2013-10-24 DIAGNOSIS — I2510 Atherosclerotic heart disease of native coronary artery without angina pectoris: Secondary | ICD-10-CM | POA: Insufficient documentation

## 2013-10-24 DIAGNOSIS — R0602 Shortness of breath: Secondary | ICD-10-CM | POA: Insufficient documentation

## 2013-10-24 MED ORDER — REGADENOSON 0.4 MG/5ML IV SOLN
0.4000 mg | Freq: Once | INTRAVENOUS | Status: AC
  Administered 2013-10-24: 0.4 mg via INTRAVENOUS

## 2013-10-24 MED ORDER — AMINOPHYLLINE 25 MG/ML IV SOLN
75.0000 mg | Freq: Once | INTRAVENOUS | Status: AC
  Administered 2013-10-24: 75 mg via INTRAVENOUS

## 2013-10-24 MED ORDER — TECHNETIUM TC 99M SESTAMIBI GENERIC - CARDIOLITE
32.0000 | Freq: Once | INTRAVENOUS | Status: AC | PRN
  Administered 2013-10-24: 32 via INTRAVENOUS

## 2013-10-24 MED ORDER — TECHNETIUM TC 99M SESTAMIBI GENERIC - CARDIOLITE
10.7000 | Freq: Once | INTRAVENOUS | Status: AC | PRN
  Administered 2013-10-24: 11 via INTRAVENOUS

## 2013-10-24 NOTE — Procedures (Addendum)
[facility] [address] [place] [address] [phone]  Cardiology Nuclear Med Study  [name] is a 79 y.o. female     MRN : [id number]     DOB: 1928/08/01  Procedure Date: 10/24/2013  Nuclear Med Background Indication for Stress Test:  Evaluation for Ischemia History:  CAD;PACER;CHF;PAF;TRICUSPID REGURG; Cardiac Risk Factors: Family History - CAD, Hypertension, IDDM Type 2, Lipids, RBBB and TIA  Symptoms:  Dizziness, DOE, Fatigue, Light-Headedness, SOB and Syncope   Nuclear Pre-Procedure Caffeine/Decaff Intake:  1:00am NPO After: 11AM   IV Site: R Antecubital  IV 0.9% NS with Angio Cath:  22g  Chest Size (in):  N/A IV Started by: Emmit Pomfret, RN  Height: 5\' 7"  (1.702 m)  Cup Size: B  BMI:  Body mass index is 28.97 kg/(m^2). Weight:  185 lb (83.915 kg)   Tech Comments:  N/A    Nuclear Med Study 1 or 2 day study: 1 day  Stress Test Type:  Lexiscan  Order Authorizing Provider:  Nanetta Batty, MD   Resting Radionuclide: Technetium 19m Sestamibi  Resting Radionuclide Dose: 10.7 mCi   Stress Radionuclide:  Technetium 74m Sestamibi  Stress Radionuclide Dose: 32.0 mCi           Stress Protocol Rest HR: 63 Stress HR: 68  Rest BP: 150/71 Stress BP: 155/61  Exercise Time (min): n/a METS: n/a          Dose of Adenosine (mg):  n/a Dose of Lexiscan: 0.4 mg  Dose of Atropine (mg): n/a Dose of Dobutamine: n/a mcg/kg/min (at max HR)  Stress Test Technologist: Ernestene Mention, CCT Nuclear Technologist: Gonzella Lex, CNMT   Rest Procedure:  Myocardial perfusion imaging was performed at rest 45 minutes following the intravenous administration of Technetium 32m Sestamibi. Stress Procedure:  The patient received IV Lexiscan 0.4 mg over 15-seconds.  Technetium 45m Sestamibi injected at 30-seconds.  Due to patient's shortness of breath, she was given IV Aminophylline 75 mg. Symptom was resolved during recovery. There  were no significant changes with Lexiscan.  Quantitative spect images were obtained after a 45 minute delay.  Transient Ischemic Dilatation (Normal <1.22):  0.83 Lung/Heart Ratio (Normal <0.45):  0.21 QGS EDV:  70 ml QGS ESV:  24 ml LV Ejection Fraction: 65%  Signed by      Rest ECG: Paced rhythm  Stress ECG: No significant change from baseline ECG  QPS Raw Data Images:  Normal; no motion artifact; normal heart/lung ratio. Stress Images:  Normal homogeneous uptake in all areas of the myocardium. Rest Images:  Normal homogeneous uptake in all areas of the myocardium. Subtraction (SDS):  No evidence of ischemia.  Impression Exercise Capacity:  Lexiscan with no exercise. BP Response:  Normal blood pressure response. Clinical Symptoms:  No significant symptoms noted. ECG Impression:  No significant ST segment change suggestive of ischemia. Comparison with Prior Nuclear Study: No significant change from previous study  Overall Impression:  Normal stress nuclear study.  LV Wall Motion:  NL LV Function; NL Wall Motion   Runell Gess, MD  10/24/2013 5:58 PM      Rest ECG: NSR - Normal EKG  Stress ECG: No significant change from baseline ECG  QPS Raw Data Images:  Normal; no motion artifact; normal heart/lung ratio. Stress Images:  Normal homogeneous uptake in all areas of the myocardium. Rest Images:  Normal homogeneous uptake in all areas of the myocardium. Subtraction (SDS):  No evidence of ischemia.  Impression Exercise Capacity:  Lexiscan with no exercise. BP Response:  Normal blood pressure response. Clinical Symptoms:  No significant symptoms noted. ECG Impression:  No significant ST segment change suggestive of ischemia. Comparison with Prior Nuclear Study: Prior study showed findings 'suspicious for ischemia'  Overall Impression:  Normal stress nuclear study.  LV Wall Motion:  NL LV Function; NL Wall Motion   Runell Gess, MD  10/24/2013  6:00 PM

## 2013-10-25 NOTE — Progress Notes (Signed)
Pt informed of results.

## 2013-10-27 DIAGNOSIS — O288 Other abnormal findings on antenatal screening of mother: Secondary | ICD-10-CM

## 2013-10-27 HISTORY — DX: Other abnormal findings on antenatal screening of mother: O28.8

## 2013-10-30 ENCOUNTER — Other Ambulatory Visit: Payer: Self-pay | Admitting: *Deleted

## 2013-10-30 MED ORDER — METOPROLOL TARTRATE 25 MG PO TABS: ORAL_TABLET | ORAL | Status: DC

## 2013-10-30 MED ORDER — PANTOPRAZOLE SODIUM 40 MG PO TBEC: DELAYED_RELEASE_TABLET | ORAL | Status: DC

## 2013-11-14 ENCOUNTER — Ambulatory Visit (INDEPENDENT_AMBULATORY_CARE_PROVIDER_SITE_OTHER): Payer: Medicare Other | Admitting: Cardiology

## 2013-11-14 ENCOUNTER — Encounter: Payer: Self-pay | Admitting: Cardiology

## 2013-11-14 VITALS — BP 140/70 | HR 92 | Ht 67.0 in | Wt 188.0 lb

## 2013-11-14 DIAGNOSIS — R0609 Other forms of dyspnea: Secondary | ICD-10-CM

## 2013-11-14 DIAGNOSIS — T50905A Adverse effect of unspecified drugs, medicaments and biological substances, initial encounter: Secondary | ICD-10-CM

## 2013-11-14 DIAGNOSIS — Z950 Presence of cardiac pacemaker: Secondary | ICD-10-CM

## 2013-11-14 DIAGNOSIS — Z7901 Long term (current) use of anticoagulants: Secondary | ICD-10-CM

## 2013-11-14 DIAGNOSIS — T887XXA Unspecified adverse effect of drug or medicament, initial encounter: Secondary | ICD-10-CM

## 2013-11-14 DIAGNOSIS — I10 Essential (primary) hypertension: Secondary | ICD-10-CM

## 2013-11-14 NOTE — Progress Notes (Signed)
11/14/2013   PCP: Nani Gasser, MD   Chief Complaint  Patient presents with  . Follow-up    follow up test    Primary Cardiologist: Dr. Allyson Sabal  HPI:  79 year old female mother of 2 and grandmother to 2 who has her daughter here with her today is being seen for DOE followup. In 2010 she had a heart catheterization with normal coronary arteries and normal LV function. She has a history of complete heart block and paroxysmal A. fib and underwent permanent pacemaker with a Medtronic device. She had not had her pacer interrogated recently we did interrogated 10/17/13 and she will do [facility] quarterly remote checks thereafter. Other history includes hypertension and hyperlipidemia pulmonary hypertension as well. She continues to have lack of energy, dyspnea on exertion, we did a 2-D last spring that was normal. BMP was normal, thyroid functions were normal. She tells me that shortness of breath with exertion and fatigue have increased and continue.  She denies any chest pain. Her daughter confirms that she does much activity at all she is extremely short of breath she rests and her symptoms improved. I walked her around the unit several times on OCT. 22 with pulse ox her rate as high as 104 and her sats remained 96-98%. Just had her pacemaker interrogated prior pacemaker rep today she's had no atrial fib except for 1 year ago she had several brief episodes.   Recently she had hematuria/vaginal bleeding uterine ultrasound revealed thickening uterine wall she underwent uterine biopsy which came back normal. Her hemoglobin in September was normal she denies any bleeding and the symptoms have continued over the last 9-6 months. Her last thyroid was also normal. She had a chest x-ray in June and again this was normal.  I ordered a lexiscan myoview which had no ischemia.  She is on amiodarone 100 mg daily.  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  Her wt is up but she is also  wearing more clothes due to weather.  She does have more edema today.   Allergies  Allergen Reactions  . Ace Inhibitors     REACTION: cough  . Fosinopril Sodium     REACTION: Cough  . Sulfamethoxazole-Trimethoprim     REACTION: Unspecified    Current Outpatient Prescriptions  Medication Sig Dispense Refill  . amiodarone (PACERONE) 200 MG tablet TAKE ONE-HALF TO ONE TABLET BY MOUTH DAILY  90 tablet  2  . aspirin 81 MG tablet Take 81 mg by mouth as needed.       . Calcium Carbonate-Vitamin D 500-125 MG-UNIT TABS Take 1 tablet by mouth 2 (two) times daily with a meal.        . flavoxATE (URISPAS) 100 MG tablet Take 1 tablet (100 mg total) by mouth 3 (three) times daily as needed (bladder spasm).  10 tablet  1  . furosemide (LASIX) 40 MG tablet Take 40 mg by mouth daily. Take 1/2 tablet daily      . gabapentin (NEURONTIN) 600 MG tablet Take 1 tablet (600 mg total) by mouth 2 (two) times daily.  60 tablet  11  . glucagon 1 MG injection Inject 1 mg into the vein once as needed. Once as needed if found unresponsive and not able to eat or drink to get sugar up.  1 each  2  . HYDROcodone-acetaminophen (NORCO/VICODIN) 5-325 MG per tablet Take 1 tablet by mouth every 6 (six) hours as needed. Take with food  40 tablet  1  . insulin aspart (NOVOLOG) 100 UNIT/ML injection Inject 10 Units into the skin 3 (three) times daily before meals. Sliding scale only      . insulin glargine (LANTUS) 100 UNIT/ML injection Inject 15 Units into the skin 2 (two) times daily.       . Insulin Syringe-Needle U-100 (EASY COMFORT INSULIN SYRINGE) 30G X 5/16" 0.5 ML MISC by Does not apply route as directed.        Marland Kitchen levothyroxine (SYNTHROID, LEVOTHROID) 100 MCG tablet Take one tablet by mouth one time daily  90 tablet  1  . metoprolol tartrate (LOPRESSOR) 25 MG tablet TAKE ONE TABLET BY MOUTH TWICE DAILY  180 tablet  1  . nitrofurantoin, macrocrystal-monohydrate, (MACROBID) 100 MG capsule Take 1 capsule (100 mg total) by  mouth 2 (two) times daily.  14 capsule  1  . Ospemifene (OSPHENA) 60 MG TABS Take 1 tablet by mouth daily.      . pantoprazole (PROTONIX) 40 MG tablet Take one tablet by mouth one time daily  90 tablet  0  . pravastatin (PRAVACHOL) 40 MG tablet Take one tablet by mouth   nightly at bedtime  90 tablet  0  . temazepam (RESTORIL) 15 MG capsule Take 1 capsule (15 mg total) by mouth at bedtime as needed. For sleep  90 capsule  1  . warfarin (COUMADIN) 2.5 MG tablet Take 2.5 mg by mouth daily. Tuesday, Thursday, Saturday and Sunday      . warfarin (COUMADIN) 5 MG tablet Take 5 mg by mouth daily. Monday, Wednesday, Friday      . albuterol (PROAIR HFA) 108 (90 BASE) MCG/ACT inhaler Inhale 2 puffs into the lungs 2 (two) times daily.  1 Inhaler  1   No current facility-administered medications for this visit.    Past Medical History  Diagnosis Date  . History of kidney stones   . Atrial fibrillation 1/11     Dr Allyson Sabal  . Ulcer     Gastric  . Esophageal stricture   . Hiatal hernia   . Diarrhea   . Memory loss   . Heart failure     chronic diatolic- 2 D echo 1/08 EF 60%  . Microalbuminuria   . Anemia     Pernicious  . Diabetes mellitus     w/neuro manifestations, type II  . Chronic kidney disease     chronic, stage III  . Cystitis   . Osteoarthrosis involving multiple sites   . Swelling     limb, left more than right ankle   . Back pain   . Peptic ulcer     unspec. w/o obstruction  . Senile osteoporosis   . IBS (irritable bowel syndrome)   . Hypertension   . Tubular adenoma of colon   . GERD (gastroesophageal reflux disease)   . Heart murmur   . Hyperlipidemia   . Thyroid disease   . Pancreatitis   . H/O cardiac catheterization 2010    normal coronary arteries, EF 60%  . Hx of echocardiogram 04/04/13    EF 55-60%, mild MR  . PAF (paroxysmal atrial fibrillation)     Last DCCV 12/29/2009  . Chronic anticoagulation     on coumadin  . Symptomatic bradycardia 01/2010    CHB, temp  pacer and then Medtronic PPM  . RBBB   . Non-stress test nonreactive 11/14    Lexiscan myoview negative for ischemia    Past Surgical History  Procedure Laterality Date  . Appendectomy    .  Egd- esophageal stricture      gastric ulcer  . Carotid dopplers- no significant stenosis  2007  . Tonsillectomy and adenoidectomy    . Cataract extraction      both eyes  . Pacemaker placement  2/11    medtronic dual chamber  . Cardiac catheterization  2010    normal coronary arteries    ZOX:[id number]:UJ colds or fevers, no weight changes Skin:no rashes or ulcers HEENT:no blurred vision, no congestion CV:see HPI PUL:see HPI GI:no diarrhea constipation or melena, no indigestion GU:no hematuria, no dysuria MS:no joint pain, no claudication, some edema Neuro:no syncope, no lightheadedness Endo:no diabetes, no thyroid disease   PE BP 140/70  Pulse 92  Ht 5\' 7"  (1.702 m)  Wt 188 lb (85.276 kg)  BMI 29.44 kg/m2 General:Pleasant affect, NAD Skin:Warm and dry, brisk capillary refill HEENT:normocephalic, sclera clear, mucus membranes moist Neck:supple, no JVD, no bruits  Heart:S1S2 RRR with soft 1/6 systolic murmur, no gallup, rub or click Lungs:diminished bases, without rales, rhonchi, or wheezes WJX:BJYN, non tender, + BS, do not palpate liver spleen or masses Ext:tr to 1+ lower ext edema, 2+ pedal pulses, 2+ radial pulses Neuro:alert and oriented, MAE, follows commands, + facial symmetry Please note pt is on 100 mg amiodarone daily  ASSESSMENT AND PLAN DOE (dyspnea on exertion) Continues with SOB.  More DOE.  Negative stress test.  Will have her do CXR and PFTs with diffusing capacities. She will follow up with Dr. Allyson Sabal after the studies. I also increased her lasix to 60 mg BID for 2 days to see if this would help at all.    HYPERTENSION, BENIGN SYSTEMIC controlled  Pacemaker No recent PAF when pacer interrogated 10/17/13   Chronic anticoagulation, on coumadin for  PAF stable

## 2013-11-14 NOTE — Assessment & Plan Note (Signed)
stable °

## 2013-11-14 NOTE — Patient Instructions (Signed)
Increase Lasix to 60 mg daily, 1.5 tabs daily for 2 days if no improvement then go back to 40 mg daily.  Follow up with  Dr. Allyson Sabal in 4 weeks once PFTs are complete.Marland Kitchen

## 2013-11-14 NOTE — Assessment & Plan Note (Signed)
No recent PAF when pacer interrogated 10/17/13

## 2013-11-14 NOTE — Assessment & Plan Note (Addendum)
Continues with SOB.  More DOE.  Negative stress test.  Will have her do CXR and PFTs with diffusing capacities. She will follow up with Dr. Allyson Sabal after the studies. I also increased her lasix to 60 mg BID for 2 days to see if this would help at all.

## 2013-11-14 NOTE — Assessment & Plan Note (Signed)
controlled 

## 2013-11-15 ENCOUNTER — Other Ambulatory Visit: Payer: Self-pay | Admitting: *Deleted

## 2013-11-15 DIAGNOSIS — E119 Type 2 diabetes mellitus without complications: Secondary | ICD-10-CM

## 2013-11-15 MED ORDER — INSULIN GLARGINE 100 UNIT/ML SOLOSTAR PEN: 22.0000 [IU] | PEN_INJECTOR | Freq: Two times a day (BID) | SUBCUTANEOUS | Status: DC

## 2013-11-16 ENCOUNTER — Other Ambulatory Visit: Payer: Self-pay | Admitting: Family Medicine

## 2013-11-21 ENCOUNTER — Encounter: Payer: Self-pay | Admitting: Family Medicine

## 2013-11-21 ENCOUNTER — Ambulatory Visit (INDEPENDENT_AMBULATORY_CARE_PROVIDER_SITE_OTHER): Payer: Medicare Other | Admitting: Family Medicine

## 2013-11-21 VITALS — BP 125/72 | HR 81 | Temp 98.1°F | Wt 182.0 lb

## 2013-11-21 DIAGNOSIS — E1149 Type 2 diabetes mellitus with other diabetic neurological complication: Secondary | ICD-10-CM

## 2013-11-21 DIAGNOSIS — M25519 Pain in unspecified shoulder: Secondary | ICD-10-CM

## 2013-11-21 DIAGNOSIS — R0602 Shortness of breath: Secondary | ICD-10-CM

## 2013-11-21 DIAGNOSIS — M25511 Pain in right shoulder: Secondary | ICD-10-CM

## 2013-11-21 DIAGNOSIS — I10 Essential (primary) hypertension: Secondary | ICD-10-CM

## 2013-11-21 DIAGNOSIS — E039 Hypothyroidism, unspecified: Secondary | ICD-10-CM

## 2013-11-21 MED ORDER — HYDROCODONE-ACETAMINOPHEN 5-325 MG PO TABS: 1.0000 | ORAL_TABLET | Freq: Four times a day (QID) | ORAL | Status: DC | PRN

## 2013-11-21 MED ORDER — PANTOPRAZOLE SODIUM 40 MG PO TBEC: DELAYED_RELEASE_TABLET | ORAL | Status: DC

## 2013-11-21 NOTE — Patient Instructions (Signed)
Increase lantus to 20 units twice a day.

## 2013-11-21 NOTE — Progress Notes (Signed)
Subjective:    Patient ID: [name], female    DOB: 1928/11/27, 79 y.o.   MRN: [id number]  HPI DM -followup diabetes. Her sugars have been running higher on average at home. She has not had any lows which is fantastic. For a time she was bottoming out and getting obtunded. Her daughter is here with her today as well. She did bring in her glucometer for me to review. 7 day av 292, 14 day average is 297, 30 day average is 300.  She does like the Apidra pen. No lows.  Using 18 units Lantus in AM and 20 units in PM  HTN-  Pt denies chest pain, SOB, dizziness, or heart palpitations.  Taking meds as directed w/o problems.  Denies medication side effects.    OA in the right shouder pain-she had an injection last February for frozen shoulder. She unfortunately had a steroid reaction and got severe cramping throughout her body for a day or 2 after the shot. After that she did get significant relief. The right shoulder has been bothering her again over the last month. She feels it's getting more sore. Her pain radiates from the lateral shoulder down towards her elbow. No numbness or tingling.  Shortness of breath is stable. Her cardiologist recently increased her Lasix for 2 days just to see if it provides any relief. She says it did help with her swelling but did not make any difference with her breathing. She mostly become short of breath with activity. She also has a fair amount deconditioning. Review of Systems     Objective:   Physical Exam  Constitutional: She is oriented to person, place, and time. She appears well-developed and well-nourished.  HENT:  Head: Normocephalic and atraumatic.  Cardiovascular: Normal rate, regular rhythm and normal heart sounds.   Pulmonary/Chest: Effort normal and breath sounds normal.  Musculoskeletal:  Right shoulder is tender just along the edge of the acromion. Nontender of the clavicle. She's able to fully extend and reach her cross though it is  uncomfortable. Strength is 5 out of 5 at the shoulder elbow and wrist. She is able to externally rotate to about the same degree as she is able to on the left.  Neurological: She is alert and oriented to person, place, and time.  Skin: Skin is warm and dry.  Psychiatric: She has a normal mood and affect. Her behavior is normal.          Assessment & Plan:  DM- Increase lantu to 20 units BID . Monitor for lows. We may need to gradually increase her dose again. She says she's been a little bit more consistent with getting her mealtime insulin which is fantastic. She really likes the Apidra because she can get in and eat immediately. I did give her 2 sample pins of Lantus today. Unfortunately she has Medicare gap as having to pay full price for her medications until January. Followup in 3 months.   Right shoulder pain-she still has a fairly normal range of motion and external rotation is symmetric compared to the left. Encouraged her to work on home exercises that she was given previously. Can use an anti-inflammatory as needed. Offered to put her into physical therapy and she said she would think about it after the holidays. Also consider injection but she declined.  Hypertension-well-controlled. Her Lasix was increased for couple of days to see if it would help her shortness of breath. That's probably what her blood pressures a little  bit lower today. She's back on her normal regimen. She did get some hand cramping after increased her Lasix dose. She felt she had a good response but did not improve or shortness of breath. It did help with her ankle and hand edema.  Shortness of breath-she was seen by Vernona Rieger in [place] at the cardiology office. They ordered a chest x-ray but she has not had chance to go and get it. Encouraged her to go downstairs to have it done and the results can be faxed to [employer]. She does have pulmonary function tests that up for about 2 weeks from now. Pulmonary function tests  are essentially normal she might be a good candidate for cardiac rehabilitation. I think this would help build up her exercise tolerance and help with her shortness of breath.

## 2013-11-24 ENCOUNTER — Other Ambulatory Visit: Payer: Self-pay | Admitting: Family Medicine

## 2013-11-26 ENCOUNTER — Other Ambulatory Visit: Payer: Self-pay | Admitting: Family Medicine

## 2013-11-27 ENCOUNTER — Ambulatory Visit (INDEPENDENT_AMBULATORY_CARE_PROVIDER_SITE_OTHER): Payer: Medicare Other

## 2013-11-27 ENCOUNTER — Other Ambulatory Visit: Payer: Self-pay | Admitting: Cardiovascular Disease

## 2013-11-27 DIAGNOSIS — R0602 Shortness of breath: Secondary | ICD-10-CM

## 2013-11-27 DIAGNOSIS — I517 Cardiomegaly: Secondary | ICD-10-CM

## 2013-11-28 ENCOUNTER — Ambulatory Visit (INDEPENDENT_AMBULATORY_CARE_PROVIDER_SITE_OTHER): Payer: Medicare Other | Admitting: Pharmacist Clinician (PhC)/ Clinical Pharmacy Specialist

## 2013-11-28 ENCOUNTER — Ambulatory Visit (HOSPITAL_COMMUNITY)
Admission: RE | Admit: 2013-11-28 | Discharge: 2013-11-28 | Disposition: A | Discharge status: Home / Self Care | Payer: Medicare Other | Source: Ambulatory Visit | Attending: Cardiology | Admitting: Cardiology

## 2013-11-28 DIAGNOSIS — R0609 Other forms of dyspnea: Secondary | ICD-10-CM | POA: Insufficient documentation

## 2013-11-28 DIAGNOSIS — T50905A Adverse effect of unspecified drugs, medicaments and biological substances, initial encounter: Secondary | ICD-10-CM

## 2013-11-28 DIAGNOSIS — R0989 Other specified symptoms and signs involving the circulatory and respiratory systems: Secondary | ICD-10-CM | POA: Insufficient documentation

## 2013-11-28 DIAGNOSIS — Z7901 Long term (current) use of anticoagulants: Secondary | ICD-10-CM

## 2013-11-28 LAB — PULMONARY FUNCTION TEST
DL/VA % pred: 49 %
DLCO cor: 14.45 ml/min/mmHg
FEF2575-%Change-Post: 75 %
FEF2575-%Pred-Post: 102 %
FEV1-%Change-Post: 17 %
FEV1-%Pred-Post: 97 %
FEV1-%Pred-Pre: 83 %
FEV1-Pre: 1.76 L
FEV1FVC-%Change-Post: 5 %
FEV1FVC-%Pred-Pre: 84 %
FEV6-Post: 3.11 L
FEV6FVC-%Pred-Pre: 102 %
FVC-%Pred-Post: 112 %
FVC-%Pred-Pre: 101 %
Post FEV1/FVC ratio: 65 %
Pre FEV6/FVC Ratio: 96 %
RV % pred: 133 %
RV: 3.49 L
TLC: 6.57 L

## 2013-11-28 LAB — COMPLETE METABOLIC PANEL WITH GFR
AST: 27 U/L (ref 0–37)
Albumin: 4.6 g/dL (ref 3.5–5.2)
Alkaline Phosphatase: 52 U/L (ref 39–117)
BUN: 13 mg/dL (ref 6–23)
CO2: 33 mEq/L — ABNORMAL HIGH (ref 19–32)
Calcium: 9.6 mg/dL (ref 8.4–10.5)
Creat: 1.48 mg/dL — ABNORMAL HIGH (ref 0.50–1.10)
GFR, Est African American: 37 mL/min — ABNORMAL LOW
GFR, Est Non African American: 33 mL/min — ABNORMAL LOW

## 2013-11-28 LAB — PROTIME-INR: Prothrombin Time: 40.2 seconds — ABNORMAL HIGH (ref 11.6–15.2)

## 2013-11-28 LAB — TSH: TSH: 0.537 u[IU]/mL (ref 0.350–4.500)

## 2013-11-28 MED ORDER — ALBUTEROL SULFATE (5 MG/ML) 0.5% IN NEBU
2.5000 mg | INHALATION_SOLUTION | Freq: Once | RESPIRATORY_TRACT | Status: AC
  Administered 2013-11-28: 2.5 mg via RESPIRATORY_TRACT

## 2013-11-29 ENCOUNTER — Telehealth: Payer: Self-pay | Admitting: Cardiovascular Disease

## 2013-11-29 ENCOUNTER — Other Ambulatory Visit: Payer: Self-pay | Admitting: *Deleted

## 2013-11-29 NOTE — Telephone Encounter (Signed)
Has appt on 12/24/13 but is wanting to know if someone can tell her the results over the phone or if its ok to wait until the 29th to see the Dr.Berry for her results .Marland Kitchen Please call   Thanks

## 2013-11-29 NOTE — Telephone Encounter (Signed)
Message forwarded to J.C. Wildman, LPN.  

## 2013-11-30 NOTE — Telephone Encounter (Signed)
Talked to pt. And let her know Dr. Allyson Sabal wanted to give her the results himself

## 2013-12-05 ENCOUNTER — Encounter: Payer: Self-pay | Admitting: Cardiovascular Disease

## 2013-12-05 ENCOUNTER — Ambulatory Visit (INDEPENDENT_AMBULATORY_CARE_PROVIDER_SITE_OTHER): Payer: Medicare Other | Admitting: Cardiovascular Disease

## 2013-12-05 ENCOUNTER — Ambulatory Visit (INDEPENDENT_AMBULATORY_CARE_PROVIDER_SITE_OTHER): Payer: Medicare Other | Admitting: Pharmacist Clinician (PhC)/ Clinical Pharmacy Specialist

## 2013-12-05 VITALS — BP 132/78 | HR 84 | Ht 67.0 in | Wt 187.0 lb

## 2013-12-05 DIAGNOSIS — I4891 Unspecified atrial fibrillation: Secondary | ICD-10-CM

## 2013-12-05 DIAGNOSIS — I10 Essential (primary) hypertension: Secondary | ICD-10-CM

## 2013-12-05 DIAGNOSIS — Z7901 Long term (current) use of anticoagulants: Secondary | ICD-10-CM

## 2013-12-05 DIAGNOSIS — R0609 Other forms of dyspnea: Secondary | ICD-10-CM

## 2013-12-05 LAB — POCT INR: INR: 2.6

## 2013-12-05 NOTE — Assessment & Plan Note (Signed)
Well-controlled on current medications 

## 2013-12-05 NOTE — Assessment & Plan Note (Signed)
Patient was seen by Nada Boozer registered nurse practitioner approximately 3 weeks ago 11/14/13 because of symptoms of shortness of breath. A 2-D echo was normal. She has a history of normal coronaries by cath. BNP was normal. A chest x-ray was normal. Oxygen saturations are normal at rest and with exertion in the office. 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 She currently is only on 100 mg a day maintaining sinus rhythm according to recent pacemaker interrogation. Based on this limited to stop her amiodarone  And follow her as an outpatient. I've offered her the opportunity to see a pulmonologist but she has declined

## 2013-12-05 NOTE — Progress Notes (Signed)
12/05/2013 [name]   [birth year]  [id number]  Primary Physician METHENEY,CATHERINE, MD Primary Cardiologist: Runell Gess MD [name]   HPI:  79 year old female mother of 2 and grandmother to 2 who has her daughter here with her today is being seen for DOE followup. In 2010 she had a heart catheterization with normal coronary arteries and normal LV function. She has a history of complete heart block and paroxysmal A. fib and underwent permanent pacemaker with a [employer] device. She had not had her pacer interrogated recently we did interrogated 10/17/13 and she will do [facility] quarterly remote checks thereafter. Other history includes hypertension and hyperlipidemia pulmonary hypertension as well. She continues to have lack of energy, dyspnea on exertion, we did a 2-D last spring that was normal. BMP was normal, thyroid functions were normal. She tells me that shortness of breath with exertion and fatigue have increased and continue.  She denies any chest pain. Her daughter confirms that she does much activity at all she is extremely short of breath she rests and her symptoms improved. I walked her around the unit several times on OCT. 22 with pulse ox her rate as high as 104 and her sats remained 96-98%. Just had her pacemaker interrogated prior pacemaker rep today she's had no atrial fib except for 1 year ago she had several brief episodes.  Recently she had hematuria/vaginal bleeding uterine ultrasound revealed thickening uterine wall she underwent uterine biopsy which came back normal. Her hemoglobin in September was normal she denies any bleeding and the symptoms have continued over the last 9-6 months. Her last thyroid was also normal. She had a chest x-ray in June and again this was normal.  I ordered a lexiscan myoview which had no ischemia. She is on amiodarone 100 mg daily. 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 Her wt is up but she is also wearing more  clothes due to weather. She does have more edema today. Since she saw Nada Boozer in the office 3 weeks ago she apparently function test that showed decrease in DLCO suggesting an abnormal diffusion capacity potentially related to amiodarone.    Current Outpatient Prescriptions  Medication Sig Dispense Refill  . albuterol (PROAIR HFA) 108 (90 BASE) MCG/ACT inhaler Inhale 2 puffs into the lungs 2 (two) times daily.  1 Inhaler  1  . amiodarone (PACERONE) 200 MG tablet .5 tab daily      . aspirin 81 MG tablet Take 81 mg by mouth as needed.       . Calcium Carbonate-Vitamin D 500-125 MG-UNIT TABS Take 1 tablet by mouth 2 (two) times daily with a meal.        . flavoxATE (URISPAS) 100 MG tablet Take 1 tablet (100 mg total) by mouth 3 (three) times daily as needed (bladder spasm).  10 tablet  1  . furosemide (LASIX) 40 MG tablet Take 20 mg by mouth daily.       Marland Kitchen gabapentin (NEURONTIN) 600 MG tablet Take 1 tablet (600 mg total) by mouth 2 (two) times daily.  60 tablet  11  . glucagon 1 MG injection Inject 1 mg into the vein once as needed. Once as needed if found unresponsive and not able to eat or drink to get sugar up.  1 each  2  . HYDROcodone-acetaminophen (NORCO/VICODIN) 5-325 MG per tablet Take 1 tablet by mouth every 6 (six) hours as needed. Take with food  40 tablet  0  . insulin aspart (NOVOLOG)  100 UNIT/ML injection Inject 8 Units into the skin 2 (two) times daily. Sliding scale only      . Insulin Glargine (LANTUS SOLOSTAR) 100 UNIT/ML SOPN Inject 20 units subcutaneously twice daily as directed by physician      . Insulin Syringe-Needle U-100 (EASY COMFORT INSULIN SYRINGE) 30G X 5/16" 0.5 ML MISC by Does not apply route as directed.        Marland Kitchen levothyroxine (SYNTHROID, LEVOTHROID) 100 MCG tablet Take one tablet by mouth one time daily  90 tablet  1  . metoprolol tartrate (LOPRESSOR) 25 MG tablet TAKE ONE TABLET BY MOUTH TWICE DAILY   60 tablet  3  . nitrofurantoin, macrocrystal-monohydrate,  (MACROBID) 100 MG capsule Take 1 capsule (100 mg total) by mouth 2 (two) times daily.  14 capsule  1  . Ospemifene (OSPHENA) 60 MG TABS Take 1 tablet by mouth daily.      . pantoprazole (PROTONIX) 40 MG tablet Take one tablet by mouth one time daily  90 tablet  0  . pravastatin (PRAVACHOL) 40 MG tablet Take one tablet by mouth   nightly at bedtime  90 tablet  0  . temazepam (RESTORIL) 15 MG capsule Take 1 tablet by mouth once nightly at bedtime as needed for sleep  30 capsule  1  . warfarin (COUMADIN) 2.5 MG tablet Take 2.5 mg by mouth daily. Tuesday, Thursday, Saturday and Sunday      . warfarin (COUMADIN) 5 MG tablet Take 5 mg by mouth daily. Monday, Wednesday, Friday       No current facility-administered medications for this visit.    Allergies  Allergen Reactions  . Ace Inhibitors     REACTION: cough  . Fosinopril Sodium     REACTION: Cough  . Sulfamethoxazole-Trimethoprim     REACTION: Unspecified    History   Social History  . Marital Status: Widowed    Spouse Name: N/A    Number of Children: N/A  . Years of Education: N/A   Occupational History  . Not on file.   Social History Main Topics  . Smoking status: Never Smoker   . Smokeless tobacco: Not on file  . Alcohol Use: No  . Drug Use: No  . Sexual Activity: No   Other Topics Concern  . Not on file   Social History Narrative  . No narrative on file     Review of Systems: General: negative for chills, fever, night sweats or weight changes.  Cardiovascular: negative for chest pain, dyspnea on exertion, edema, orthopnea, palpitations, paroxysmal nocturnal dyspnea or shortness of breath Dermatological: negative for rash Respiratory: negative for cough or wheezing Urologic: negative for hematuria Abdominal: negative for nausea, vomiting, diarrhea, bright red blood per rectum, melena, or hematemesis Neurologic: negative for visual changes, syncope, or dizziness All other systems reviewed and are otherwise  negative except as noted above.    Blood pressure 132/78, pulse 84, height 5\' 7"  (1.702 m), weight 187 lb (84.823 kg).  General appearance: alert and no distress Neck: no adenopathy, no carotid bruit, no JVD, supple, symmetrical, trachea midline and thyroid not enlarged, symmetric, no tenderness/mass/nodules Lungs: clear to auscultation bilaterally Heart: regular rate and rhythm, S1, S2 normal, no murmur, click, rub or gallop Extremities: extremities normal, atraumatic, no cyanosis or edema  EKG not performed today  ASSESSMENT AND PLAN:   DOE (dyspnea on exertion) Patient was seen by Nada Boozer registered nurse practitioner approximately 3 weeks ago 11/14/13 because of symptoms of shortness of breath. A  2-D echo was normal. She has a history of normal coronaries by cath. BNP was normal. A chest x-ray was normal. Oxygen saturations are normal at rest and with exertion in the office. 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 She currently is only on 100 mg a day maintaining sinus rhythm according to recent pacemaker interrogation. Based on this limited to stop her amiodarone  And follow her as an outpatient. I've offered her the opportunity to see a pulmonologist but she has declined  HYPERTENSION, BENIGN SYSTEMIC Well-controlled on current medications      Runell Gess MD [facility], [facility] 12/05/2013 10:04 AM

## 2013-12-05 NOTE — Patient Instructions (Signed)
  We will see you back in follow up in 3 months with Nada Boozer NP and 6 months with Dr Allyson Sabal.  Dr Allyson Sabal has ordered for you to discontinue the amiodarone

## 2013-12-10 ENCOUNTER — Ambulatory Visit (INDEPENDENT_AMBULATORY_CARE_PROVIDER_SITE_OTHER): Payer: Medicare Other

## 2013-12-10 ENCOUNTER — Telehealth: Payer: Self-pay | Admitting: *Deleted

## 2013-12-10 ENCOUNTER — Ambulatory Visit (INDEPENDENT_AMBULATORY_CARE_PROVIDER_SITE_OTHER): Payer: Medicare Other | Admitting: Family Medicine

## 2013-12-10 ENCOUNTER — Encounter: Payer: Self-pay | Admitting: Family Medicine

## 2013-12-10 VITALS — BP 129/67 | HR 80 | Temp 98.2°F | Wt 189.0 lb

## 2013-12-10 DIAGNOSIS — M533 Sacrococcygeal disorders, not elsewhere classified: Secondary | ICD-10-CM

## 2013-12-10 NOTE — Patient Instructions (Signed)
Taking Ibuprofen 400mg  3 times a day . Take with food and water.

## 2013-12-10 NOTE — Progress Notes (Signed)
   Subjective:    Patient ID: [name], female    DOB: [birth year], 79 y.o.   MRN: [id number]  HPI  1 wk ago on tailbone she has been using a heating pad to help relieve the pain. Took IBU one day but not since then.  Getting more sore so came in bc worried. She has taken a couple of hydrocodone that refill for her last month. She says it does help some. She said she was taking her shoes off and lost her balance and fell backward and hit her bottom. She said initially she didn't think she hurt anything but has been gradually getting more sore since then. She denies any numbness certainly down her legs. She's able to walk normally but it's just sore.   Review of Systems     Objective:   Physical Exam  She is tender at the bottom of the sacrum and over the coccyx. Do not go anything shifting or popping in place. She's very tender. No erythema, swelling, bruising. Normal gait. Pain transitioning from sitting to standing.      Assessment & Plan:  Coccygeal pain-will get x-ray to evaluate for fracture though explained her that oftentimes fractures typically have to be treated with rest. For now recommend starting an anti-inflammatory. Ibuprofen 400 mg 3 times a day. Take with food and water. Stop immediately if any GI upset or irritation. Only takes about 5 days and her and give her stomach a break. She can use hydrocodone as needed for breakthrough pain. Also recommend that she get a soft doughnut cushion that inflatable to help take pressure off that area when she's sitting for periods of time. May take up to 2-3 months to completely heal.

## 2013-12-10 NOTE — Telephone Encounter (Signed)
Called and informed pt of results.[name]   Notes Recorded by Agapito Games, MD on 12/10/2013 at 5:18 PM Call patient: No fracture of the sacrum or the coccyx which is reassuring. Recommend continue to sit on some type of cushion such as a donut cushion. She does have some arthritis. Tried the anti-inflammatory as recommended.

## 2013-12-13 ENCOUNTER — Ambulatory Visit: Payer: Medicare Other | Admitting: Cardiovascular Disease

## 2013-12-14 ENCOUNTER — Telehealth: Payer: Self-pay

## 2013-12-14 NOTE — Telephone Encounter (Signed)
Patient has a cough, wheezing, runny nose with clear discharge and headache for 3 days. Denies fever, chills, ear pain or sweats. I advised her to come in for an appointment. She refused. She has on her PJ's and does not want to get dressed. What can she take over the counter?

## 2013-12-14 NOTE — Telephone Encounter (Signed)
Advised daughter 

## 2013-12-14 NOTE — Telephone Encounter (Signed)
[place] is good for general cold symptoms. If get SOB or has a fever then definitely needs to come in and be seen this weekend.

## 2013-12-21 DIAGNOSIS — D72829 Elevated white blood cell count, unspecified: Secondary | ICD-10-CM | POA: Insufficient documentation

## 2013-12-21 DIAGNOSIS — R739 Hyperglycemia, unspecified: Secondary | ICD-10-CM | POA: Insufficient documentation

## 2013-12-24 ENCOUNTER — Ambulatory Visit: Payer: Medicare Other | Admitting: Cardiovascular Disease

## 2014-01-02 ENCOUNTER — Ambulatory Visit (INDEPENDENT_AMBULATORY_CARE_PROVIDER_SITE_OTHER): Payer: Medicare Other | Admitting: Family Medicine

## 2014-01-02 ENCOUNTER — Encounter: Payer: Self-pay | Admitting: Family Medicine

## 2014-01-02 VITALS — BP 113/66 | HR 95 | Temp 98.4°F | Wt 179.0 lb

## 2014-01-02 DIAGNOSIS — J154 Pneumonia due to other streptococci: Secondary | ICD-10-CM

## 2014-01-02 NOTE — Progress Notes (Signed)
Subjective:    Patient ID: [name], female    DOB: 1928/08/06, 80 y.o.   MRN: [id number]  HPI Admitted to the hospital on December 26 through December 29 at the [facility]. She was diagnosed with strep pneumonia, based on cultures. She was discharged home on amoxicillin/Tussionex/Mucinex.     Hospital discharge summary as below: "Hospital Course: [name] is an 80 year old female with past medical history of hypertension, diabetes, thyroid disease, reflux disease, atrial fibrillation who presented to [facility] with complaints of cough for the past 11 days productive of green phlegm. She also noted low-grade temperature, fever, anorexia, and dyspnea. In the emergency department, patient was noted to have a WBC count of 19,800.Marland Kitchen BMP was unremarkable except for an elevated creatinine of 1.32 and glucose of 284. Portable chest x-ray showed right basilar pneumonia Patient also had a fever of 102. INR was 9.1. Patient was administered Rocephin and vitamin K in the emergency department. She was admitted to telemetry for further workup and treatment of pneumonia.  She was started on Rocephin and ceftriaxone for community-acquired pneumonia. However, patient's CBC count increased to 26,000. She was switched to vancomycin and Zosyn. WBC slowly decreased to 14,000. Patient was treated for cough with tessalon perles, tussionex, and mucionex. Strep pneumo antigen was positive. Patient was doing well on day of discharge. Vital signs were all within normals. INR was therapeutic. Blood cultures remained negative. Patient was saturating 92-95% at rest and with ambulation. She will be discharged with prescription for cough suppressant and amoxicillin for 6 more days. She understands that she'll need to follow-up with PCP in one week."  She still feels weak and still coughing. She feels some better.  No fever, chills or sweats.  Poor appetitie.  No Chest pain.  Sugar was 266  before bedtime and then 60 this morning.    Review of Systems     Objective:   Physical Exam  Constitutional: She is oriented to person, place, and time. She appears well-developed and well-nourished.  HENT:  Head: Normocephalic and atraumatic.  Right Ear: External ear normal.  Left Ear: External ear normal.  Nose: Nose normal.  Mouth/Throat: Oropharynx is clear and moist.  TMs and canals are clear.   Eyes: Conjunctivae and EOM are normal. Pupils are equal, round, and reactive to light.  Neck: Neck supple. No thyromegaly present.  Cardiovascular: Normal rate, regular rhythm and normal heart sounds.   Pulmonary/Chest: Effort normal and breath sounds normal. She has no wheezes.  Lymphadenopathy:    She has no cervical adenopathy.  Neurological: She is alert and oriented to person, place, and time.  Skin: Skin is warm and dry.  Psychiatric: She has a normal mood and affect.          Assessment & Plan:  Pneumonia, strep - Improving.  She has not had a fever for the last couple of days and she feels like she is gradually getting better even though she still feels very weak and tired. Make sure to complete the antibiotic. She still has about 4-5 days left. She does still have some cough medication at home. If she runs out before the weekend then please give [place] a call back and I am okay to refill this. Keep regularly scheduled followup in February. If she feels like she's not continuing to improve then please followup or if she feels like she suddenly getting worse then please call the office back. Stay well hydrated and try to  increase by mouth intake.

## 2014-01-10 ENCOUNTER — Other Ambulatory Visit: Payer: Self-pay | Admitting: Cardiovascular Disease

## 2014-01-10 LAB — PROTIME-INR
INR: 4.02 — ABNORMAL HIGH (ref <1.50)
Prothrombin Time: 37.9 seconds — ABNORMAL HIGH (ref 11.6–15.2)

## 2014-01-11 ENCOUNTER — Ambulatory Visit (INDEPENDENT_AMBULATORY_CARE_PROVIDER_SITE_OTHER): Payer: Medicare Other | Admitting: Pharmacist Clinician (PhC)/ Clinical Pharmacy Specialist

## 2014-01-11 DIAGNOSIS — Z7901 Long term (current) use of anticoagulants: Secondary | ICD-10-CM

## 2014-01-18 ENCOUNTER — Ambulatory Visit: Payer: Medicare Other | Admitting: *Deleted

## 2014-01-20 ENCOUNTER — Other Ambulatory Visit: Payer: Self-pay | Admitting: Family Medicine

## 2014-01-20 LAB — MDC_IDC_ENUM_SESS_TYPE_REMOTE
Battery Impedance: 180 Ohm
Battery Remaining Longevity: 129 mo
Battery Voltage: 2.79 V
Brady Statistic AP VP Percent: 72 %
Brady Statistic AP VS Percent: 0 %
Brady Statistic AS VP Percent: 23 %
Date Time Interrogation Session: 20150125180432
Lead Channel Impedance Value: 506 Ohm
Lead Channel Impedance Value: 659 Ohm
Lead Channel Pacing Threshold Amplitude: 0.625 V
Lead Channel Pacing Threshold Amplitude: 1.5 V
Lead Channel Sensing Intrinsic Amplitude: 2.8 mV
Lead Channel Setting Pacing Amplitude: 2 V
Lead Channel Setting Sensing Sensitivity: 2 mV
MDC IDC MSMT LEADCHNL RA PACING THRESHOLD PULSEWIDTH: 0.4 ms
MDC IDC MSMT LEADCHNL RV PACING THRESHOLD PULSEWIDTH: 0.4 ms
MDC IDC SET LEADCHNL RA PACING AMPLITUDE: 1.5 V
MDC IDC SET LEADCHNL RV PACING PULSEWIDTH: 0.4 ms
MDC IDC STAT BRADY AS VS PERCENT: 5 %

## 2014-01-20 LAB — PACEMAKER DEVICE OBSERVATION

## 2014-01-21 ENCOUNTER — Ambulatory Visit (INDEPENDENT_AMBULATORY_CARE_PROVIDER_SITE_OTHER): Payer: Medicare Other | Admitting: *Deleted

## 2014-01-21 DIAGNOSIS — I4891 Unspecified atrial fibrillation: Secondary | ICD-10-CM

## 2014-01-21 DIAGNOSIS — I480 Paroxysmal atrial fibrillation: Secondary | ICD-10-CM

## 2014-01-22 ENCOUNTER — Encounter: Payer: Self-pay | Admitting: *Deleted

## 2014-01-31 ENCOUNTER — Encounter: Payer: Self-pay | Admitting: *Deleted

## 2014-02-06 ENCOUNTER — Ambulatory Visit: Payer: Medicare Other | Admitting: Family Medicine

## 2014-02-06 DIAGNOSIS — I10 Essential (primary) hypertension: Secondary | ICD-10-CM

## 2014-02-06 DIAGNOSIS — Z950 Presence of cardiac pacemaker: Secondary | ICD-10-CM

## 2014-02-06 DIAGNOSIS — I4891 Unspecified atrial fibrillation: Secondary | ICD-10-CM

## 2014-02-06 DIAGNOSIS — E119 Type 2 diabetes mellitus without complications: Secondary | ICD-10-CM

## 2014-02-06 DIAGNOSIS — J189 Pneumonia, unspecified organism: Secondary | ICD-10-CM

## 2014-02-07 ENCOUNTER — Other Ambulatory Visit: Payer: Self-pay | Admitting: Sports Medicine

## 2014-02-18 ENCOUNTER — Ambulatory Visit: Payer: Medicare Other | Admitting: Family Medicine

## 2014-02-18 ENCOUNTER — Other Ambulatory Visit: Payer: Self-pay | Admitting: Cardiovascular Disease

## 2014-02-18 LAB — PROTIME-INR
INR: 5.17 (ref <1.50)
PROTHROMBIN TIME: 45.9 s — AB (ref 11.6–15.2)

## 2014-02-19 ENCOUNTER — Telehealth: Payer: Self-pay | Admitting: Cardiovascular Disease

## 2014-02-19 ENCOUNTER — Ambulatory Visit (INDEPENDENT_AMBULATORY_CARE_PROVIDER_SITE_OTHER): Payer: Medicare Other | Admitting: Pharmacist Clinician (PhC)/ Clinical Pharmacy Specialist

## 2014-02-19 DIAGNOSIS — Z7901 Long term (current) use of anticoagulants: Secondary | ICD-10-CM

## 2014-02-19 NOTE — Telephone Encounter (Signed)
Critical INR 5.17.   Results sent to K. [place]vstad

## 2014-02-20 ENCOUNTER — Ambulatory Visit: Payer: Medicare Other | Admitting: Family Medicine

## 2014-02-26 ENCOUNTER — Other Ambulatory Visit: Payer: Self-pay | Admitting: Cardiovascular Disease

## 2014-02-26 LAB — PROTIME-INR
INR: 2.08 — ABNORMAL HIGH (ref <1.50)
Prothrombin Time: 22.9 seconds — ABNORMAL HIGH (ref 11.6–15.2)

## 2014-02-27 ENCOUNTER — Ambulatory Visit (INDEPENDENT_AMBULATORY_CARE_PROVIDER_SITE_OTHER): Payer: Medicare Other | Admitting: Pharmacist Clinician (PhC)/ Clinical Pharmacy Specialist

## 2014-02-27 DIAGNOSIS — Z7901 Long term (current) use of anticoagulants: Secondary | ICD-10-CM

## 2014-03-06 ENCOUNTER — Encounter: Payer: Self-pay | Admitting: Family Medicine

## 2014-03-06 ENCOUNTER — Ambulatory Visit (INDEPENDENT_AMBULATORY_CARE_PROVIDER_SITE_OTHER): Payer: Medicare Other | Admitting: Family Medicine

## 2014-03-06 ENCOUNTER — Other Ambulatory Visit: Payer: Self-pay | Admitting: *Deleted

## 2014-03-06 VITALS — BP 130/58 | HR 86 | Wt 180.0 lb

## 2014-03-06 DIAGNOSIS — I5032 Chronic diastolic (congestive) heart failure: Secondary | ICD-10-CM

## 2014-03-06 DIAGNOSIS — E1149 Type 2 diabetes mellitus with other diabetic neurological complication: Secondary | ICD-10-CM

## 2014-03-06 DIAGNOSIS — M549 Dorsalgia, unspecified: Secondary | ICD-10-CM

## 2014-03-06 DIAGNOSIS — R42 Dizziness and giddiness: Secondary | ICD-10-CM

## 2014-03-06 LAB — POCT GLYCOSYLATED HEMOGLOBIN (HGB A1C): Hemoglobin A1C: 7.8

## 2014-03-06 MED ORDER — OXYCODONE-ACETAMINOPHEN 5-325 MG PO TABS: 1.0000 | ORAL_TABLET | Freq: Four times a day (QID) | ORAL | Status: DC | PRN

## 2014-03-06 MED ORDER — TEMAZEPAM 15 MG PO CAPS: ORAL_CAPSULE | ORAL | Status: DC

## 2014-03-06 MED ORDER — MECLIZINE HCL 25 MG PO TABS: 25.0000 mg | ORAL_TABLET | Freq: Three times a day (TID) | ORAL | Status: AC | PRN

## 2014-03-06 MED ORDER — FLAVOXATE HCL 100 MG PO TABS: 100.0000 mg | ORAL_TABLET | Freq: Three times a day (TID) | ORAL | Status: DC | PRN

## 2014-03-06 NOTE — Patient Instructions (Signed)

## 2014-03-06 NOTE — Progress Notes (Signed)
   Subjective:    Patient ID: [name], female    DOB: 1928/05/19, 80 y.o.   MRN: [id number]  HPI Diabetes - no hypoglycemic events. No wounds or sores that are not healing well. No increased thirst or urination. Checking glucose at home. Taking medications as prescribed without any side effects. She was given a new lancet ultimately device from her mail order pharmacy. She's been having difficulty using it and brought it in with her today.  Back Pain - feels like the vicodin is not strong enough at times. The summertime she uses for pain control but occasionally feels she needs something stronger. We last saw her oxycodone 6 months ago, for 30 tabs. Also on gabapentin BID 600mg .   Her vertigo has been flaring as well. She feels dizzy when gets out of the chair.    Diastolic heart failure - no recent onset swelling , chest pain, or shortness of breath.  Review of Systems     Objective:   Physical Exam  Constitutional: She is oriented to person, place, and time. She appears well-developed and well-nourished.  HENT:  Head: Normocephalic and atraumatic.  Cardiovascular: Normal rate, regular rhythm and normal heart sounds.   Pulmonary/Chest: Effort normal and breath sounds normal.  Neurological: She is alert and oriented to person, place, and time.  Skin: Skin is warm and dry.  Psychiatric: She has a normal mood and affect. Her behavior is normal.          Assessment & Plan:  DM - A1C is 7.8. Improved from last time. Keep up the good work. I am concerned that her evening sugars still a little too high. Several are in the 300 and 400 range. For her morning sugars look great. About her she needs to be consistent with her mealtime insulin especially with her evening meal. She admits she often forgets. Unfortunately, we were unable to get her lancet device to work. Encouraged her to take it to her local pharmacy to see if they can help her and if not then she may want to call her mail  order pharmacy and see if they can offer something else. Followup in 3 months. We'll repeat BMP at that time.  Back Pain- would like a refill on her oxycodone today. I think it's reasonable for her to use a small quantity of needed. I think 30 tabs to last her 6 months is very reasonable. Just reminded her again not to mix it with the hydrocodone. This can cause respiratory depression.  Vertigo-it sounds like it is somewhat positional and she's had this recurrently. Given a handout today on exercises to do on her own at home. If it's not improving over the next one to 2 weeks and please let me know and we can order a formal physical therapy for this.  Diastolic heart failure-stable.

## 2014-03-13 ENCOUNTER — Telehealth: Payer: Self-pay | Admitting: Family Medicine

## 2014-03-13 ENCOUNTER — Other Ambulatory Visit: Payer: Self-pay | Admitting: Family Medicine

## 2014-03-13 NOTE — Telephone Encounter (Signed)
Please call patient and let her know that I received a letter from [place] [facility]. They will no longer cover her Urispas. I believe this was originally prescribed by her urologist. She may wait to contact him for further suggestions for what might be on her formulary.

## 2014-03-13 NOTE — Telephone Encounter (Signed)
Pt states that she doesn't need that medication anyway but thanks for being concerned.  Oscar La, LPN

## 2014-03-18 ENCOUNTER — Other Ambulatory Visit: Payer: Self-pay | Admitting: Cardiovascular Disease

## 2014-03-18 LAB — PROTIME-INR
INR: 1.03 (ref <1.50)
Prothrombin Time: 13.4 seconds (ref 11.6–15.2)

## 2014-03-19 ENCOUNTER — Ambulatory Visit (INDEPENDENT_AMBULATORY_CARE_PROVIDER_SITE_OTHER): Payer: Medicare Other | Admitting: Pharmacist Clinician (PhC)/ Clinical Pharmacy Specialist

## 2014-03-19 DIAGNOSIS — Z7901 Long term (current) use of anticoagulants: Secondary | ICD-10-CM

## 2014-03-29 ENCOUNTER — Other Ambulatory Visit: Payer: Self-pay | Admitting: Cardiovascular Disease

## 2014-03-29 ENCOUNTER — Other Ambulatory Visit: Payer: Self-pay | Admitting: Family Medicine

## 2014-03-30 LAB — PROTIME-INR
INR: 2.08 — ABNORMAL HIGH (ref <1.50)
Prothrombin Time: 22.9 seconds — ABNORMAL HIGH (ref 11.6–15.2)

## 2014-04-01 ENCOUNTER — Ambulatory Visit (INDEPENDENT_AMBULATORY_CARE_PROVIDER_SITE_OTHER): Payer: Medicare Other | Admitting: Pharmacist Clinician (PhC)/ Clinical Pharmacy Specialist

## 2014-04-01 ENCOUNTER — Other Ambulatory Visit: Payer: Self-pay | Admitting: *Deleted

## 2014-04-01 DIAGNOSIS — Z7901 Long term (current) use of anticoagulants: Secondary | ICD-10-CM

## 2014-04-01 MED ORDER — LEVOTHYROXINE SODIUM 100 MCG PO TABS: ORAL_TABLET | ORAL | Status: DC

## 2014-04-02 ENCOUNTER — Other Ambulatory Visit: Payer: Self-pay | Admitting: Family Medicine

## 2014-04-15 ENCOUNTER — Other Ambulatory Visit: Payer: Self-pay | Admitting: Family Medicine

## 2014-04-25 ENCOUNTER — Other Ambulatory Visit: Payer: Self-pay | Admitting: Cardiovascular Disease

## 2014-04-26 ENCOUNTER — Ambulatory Visit (INDEPENDENT_AMBULATORY_CARE_PROVIDER_SITE_OTHER): Payer: Medicare Other | Admitting: Pharmacist Clinician (PhC)/ Clinical Pharmacy Specialist

## 2014-04-26 DIAGNOSIS — Z7901 Long term (current) use of anticoagulants: Secondary | ICD-10-CM

## 2014-04-26 LAB — PROTIME-INR
INR: 3.62 — ABNORMAL HIGH (ref <1.50)
PROTHROMBIN TIME: 35 s — AB (ref 11.6–15.2)

## 2014-05-08 ENCOUNTER — Encounter: Payer: Self-pay | Admitting: *Deleted

## 2014-05-17 ENCOUNTER — Other Ambulatory Visit: Payer: Self-pay | Admitting: Family Medicine

## 2014-05-25 ENCOUNTER — Encounter: Payer: Self-pay | Admitting: Internal Medicine

## 2014-05-27 ENCOUNTER — Other Ambulatory Visit: Payer: Self-pay | Admitting: Cardiovascular Disease

## 2014-05-27 LAB — PROTIME-INR
INR: 2.01 — AB (ref <1.50)
Prothrombin Time: 22.3 seconds — ABNORMAL HIGH (ref 11.6–15.2)

## 2014-05-28 ENCOUNTER — Ambulatory Visit (INDEPENDENT_AMBULATORY_CARE_PROVIDER_SITE_OTHER): Payer: Medicare Other | Admitting: Pharmacist Clinician (PhC)/ Clinical Pharmacy Specialist

## 2014-05-28 DIAGNOSIS — Z7901 Long term (current) use of anticoagulants: Secondary | ICD-10-CM

## 2014-06-05 ENCOUNTER — Encounter: Payer: Self-pay | Admitting: Family Medicine

## 2014-06-05 ENCOUNTER — Ambulatory Visit (INDEPENDENT_AMBULATORY_CARE_PROVIDER_SITE_OTHER): Payer: Medicare Other | Admitting: Family Medicine

## 2014-06-05 VITALS — BP 116/65 | HR 91 | Wt 181.0 lb

## 2014-06-05 DIAGNOSIS — R2681 Unsteadiness on feet: Secondary | ICD-10-CM

## 2014-06-05 DIAGNOSIS — R269 Unspecified abnormalities of gait and mobility: Secondary | ICD-10-CM

## 2014-06-05 DIAGNOSIS — M5136 Other intervertebral disc degeneration, lumbar region: Secondary | ICD-10-CM

## 2014-06-05 DIAGNOSIS — E1149 Type 2 diabetes mellitus with other diabetic neurological complication: Secondary | ICD-10-CM

## 2014-06-05 DIAGNOSIS — M5137 Other intervertebral disc degeneration, lumbosacral region: Secondary | ICD-10-CM

## 2014-06-05 DIAGNOSIS — M62838 Other muscle spasm: Secondary | ICD-10-CM

## 2014-06-05 DIAGNOSIS — G4700 Insomnia, unspecified: Secondary | ICD-10-CM

## 2014-06-05 LAB — POCT GLYCOSYLATED HEMOGLOBIN (HGB A1C): Hemoglobin A1C: 7.8

## 2014-06-05 MED ORDER — CLOTRIMAZOLE-BETAMETHASONE 1-0.05 % EX CREA: 1.0000 "application " | TOPICAL_CREAM | Freq: Two times a day (BID) | CUTANEOUS | Status: DC

## 2014-06-05 MED ORDER — HYDROCODONE-ACETAMINOPHEN 5-325 MG PO TABS: 1.0000 | ORAL_TABLET | Freq: Four times a day (QID) | ORAL | Status: DC | PRN

## 2014-06-05 MED ORDER — GABAPENTIN 600 MG PO TABS: 600.0000 mg | ORAL_TABLET | Freq: Three times a day (TID) | ORAL | Status: DC

## 2014-06-05 MED ORDER — OXYCODONE-ACETAMINOPHEN 5-325 MG PO TABS: 1.0000 | ORAL_TABLET | Freq: Four times a day (QID) | ORAL | Status: DC | PRN

## 2014-06-05 MED ORDER — NITROFURANTOIN MONOHYD MACRO 100 MG PO CAPS: 100.0000 mg | ORAL_CAPSULE | Freq: Two times a day (BID) | ORAL | Status: DC

## 2014-06-05 NOTE — Progress Notes (Signed)
Called and lvm informing pt's daughter that she will need to bring her mother back in to have her labs done and she will need to be fasting .[name]

## 2014-06-05 NOTE — Progress Notes (Signed)
Subjective:    Patient ID: [name], female    DOB: 1928/12/27, 80 y.o.   MRN: [id number]  HPI Diabetes - no hypoglycemic events. No wounds or sores that are not healing well. No increased thirst or urination. Checking glucose at home. Taking medications as prescribed without any side effects. She did bring in her glucometer today and I reviewed the readings. She had a few lows in the 70s and several readings in the 300 range. The highest was over 400. She said she did use her short acting insulin when it was high.  Would like a Rx for a cream another doctor gave her for redness an irritation in the groin area.  Her dermatologist has now left.     Daugher has noticed she has been shuffling her gait.  Twice has had an episdoes where couldn't bend her legs. They were stiff.  First time got out of the bed to go to bathroom and couldn't bend her legs. Happened again about a week ago after getting out of bed. Took about an hour before she could get up out of bed. They were painful when that was happening.  Says constanlty has pain from her knees to feet.  Says feels like pinching sensation. Says her right arm will drift to the side sometims. It does affect her gait. She says that her feet feel a most like they're swollen when she walks on them. They feel like they're puffy, similar to when she would get bee stings as a child. Sometimes she doesn't trust her likes to carry her. She has a walker but rarely uses it.  Insomnia-still struggling with her sleep. Some night she does fine other night so since it does. She would like a refill on her Restoril. Review of Systems  There were no vitals taken for this visit.    Allergies  Allergen Reactions  . Ace Inhibitors     REACTION: cough  . Fosinopril Sodium     REACTION: Cough  . Sulfamethoxazole-Trimethoprim     REACTION: Unspecified    Past Medical History  Diagnosis Date  . History of kidney stones   . Atrial fibrillation 1/11     Dr  Gwenlyn Found  . Ulcer     Gastric  . Esophageal stricture   . Hiatal hernia   . Diarrhea   . Memory loss   . Heart failure     chronic diatolic- 2 D echo 5/85 EF 60%  . Microalbuminuria   . Anemia     Pernicious  . Diabetes mellitus     w/neuro manifestations, type II  . Chronic kidney disease     chronic, stage III  . Cystitis   . Osteoarthrosis involving multiple sites   . Swelling     limb, left more than right ankle   . Back pain   . Peptic ulcer     unspec. w/o obstruction  . Senile osteoporosis   . IBS (irritable bowel syndrome)   . Hypertension   . Tubular adenoma of colon   . GERD (gastroesophageal reflux disease)   . Heart murmur   . Hyperlipidemia   . Thyroid disease   . Pancreatitis   . H/O cardiac catheterization 2010    normal coronary arteries, EF 60%  . Hx of echocardiogram 04/04/13    EF 55-60%, mild MR  . PAF (paroxysmal atrial fibrillation)     Last DCCV 12/29/2009  . Chronic anticoagulation     on coumadin  .  Symptomatic bradycardia 01/2010    CHB, temp pacer and then Medtronic PPM  . RBBB   . Non-stress test nonreactive 11/14    Lexiscan myoview negative for ischemia    Past Surgical History  Procedure Laterality Date  . Appendectomy    . Egd- esophageal stricture      gastric ulcer  . Carotid dopplers- no significant stenosis  2007  . Tonsillectomy and adenoidectomy    . Cataract extraction      both eyes  . Pacemaker placement  2/11    medtronic dual chamber  . Cardiac catheterization  2010    normal coronary arteries    History   Social History  . Marital Status: Widowed    Spouse Name: N/A    Number of Children: N/A  . Years of Education: N/A   Occupational History  . Not on file.   Social History Main Topics  . Smoking status: Never Smoker   . Smokeless tobacco: Not on file  . Alcohol Use: No  . Drug Use: No  . Sexual Activity: No   Other Topics Concern  . Not on file   Social History Narrative  . No narrative on file     Family History  Problem Relation Age of Onset  . Diabetes Mother   . Prostate cancer Father   . Heart attack Father   . Colon cancer Sister   . Esophageal cancer Neg Hx   . Rectal cancer Neg Hx   . Stomach cancer Neg Hx     Outpatient Encounter Prescriptions as of 06/05/2014  Medication Sig  . albuterol (PROAIR HFA) 108 (90 BASE) MCG/ACT inhaler Inhale 2 puffs into the lungs 2 (two) times daily.  Marland Kitchen aspirin 81 MG tablet Take 81 mg by mouth as needed.   . Calcium Carbonate-Vitamin D 500-125 MG-UNIT TABS Take 1 tablet by mouth 2 (two) times daily with a meal.    . clotrimazole-betamethasone (LOTRISONE) cream Apply 1 application topically 2 (two) times daily.  . furosemide (LASIX) 40 MG tablet Take 20 mg by mouth daily.   Marland Kitchen gabapentin (NEURONTIN) 600 MG tablet Take 1 tablet (600 mg total) by mouth 3 (three) times daily.  Marland Kitchen glucagon 1 MG injection Inject 1 mg into the vein once as needed. Once as needed if found unresponsive and not able to eat or drink to get sugar up.  Marland Kitchen HYDROcodone-acetaminophen (NORCO/VICODIN) 5-325 MG per tablet Take 1 tablet by mouth every 6 (six) hours as needed. Take with food  . ibuprofen (ADVIL,MOTRIN) 200 MG tablet Take 400 mg by mouth as needed.  . insulin aspart (NOVOLOG) 100 UNIT/ML injection Inject 8 Units into the skin 2 (two) times daily. Sliding scale only  . Insulin Glargine (LANTUS SOLOSTAR) 100 UNIT/ML SOPN Inject 20 units subcutaneously twice daily as directed by physician  . Insulin Syringe-Needle U-100 (EASY COMFORT INSULIN SYRINGE) 30G X 5/16" 0.5 ML MISC by Does not apply route as directed.    Marland Kitchen levothyroxine (SYNTHROID, LEVOTHROID) 100 MCG tablet TAKE ONE TABLET BY MOUTH ONE TIME DAILY  . meclizine (ANTIVERT) 25 MG tablet Take 1 tablet (25 mg total) by mouth 3 (three) times daily as needed for dizziness or nausea.  . metoprolol tartrate (LOPRESSOR) 25 MG tablet TAKE ONE TABLET BY MOUTH TWICE DAILY   . nitrofurantoin, macrocrystal-monohydrate,  (MACROBID) 100 MG capsule Take 1 capsule (100 mg total) by mouth 2 (two) times daily.  . Ospemifene (OSPHENA) 60 MG TABS Take 1 tablet by mouth daily.  Marland Kitchen  oxyCODONE-acetaminophen (PERCOCET) 5-325 MG per tablet Take 1 tablet by mouth every 6 (six) hours as needed for severe pain.  . pantoprazole (PROTONIX) 40 MG tablet TAKE ONE TABLET BY MOUTH ONE TIME DAILY   . pravastatin (PRAVACHOL) 40 MG tablet TAKE ONE TABLET BY MOUTH NIGHTLY AT BEDTIME   . temazepam (RESTORIL) 15 MG capsule Take 1 capsule by mouth once nightly at bedtime as needed for sleep  . warfarin (COUMADIN) 2.5 MG tablet Take 2.5 mg by mouth daily. Tuesday, Thursday, Saturday and Sunday  . warfarin (COUMADIN) 5 MG tablet Take 5 mg by mouth daily. Monday, Wednesday, Friday  . [DISCONTINUED] gabapentin (NEURONTIN) 600 MG tablet Take one tablet by mouth twice daily  . [DISCONTINUED] HYDROcodone-acetaminophen (NORCO/VICODIN) 5-325 MG per tablet Take 1 tablet by mouth every 6 (six) hours as needed. Take with food  . [DISCONTINUED] nitrofurantoin, macrocrystal-monohydrate, (MACROBID) 100 MG capsule Take 1 capsule (100 mg total) by mouth 2 (two) times daily.          Objective:   Physical Exam  Constitutional: She is oriented to person, place, and time. She appears well-developed and well-nourished.  HENT:  Head: Normocephalic and atraumatic.  Cardiovascular: Normal rate, regular rhythm and normal heart sounds.   Pulmonary/Chest: Effort normal and breath sounds normal.  Neurological: She is alert and oriented to person, place, and time.  Skin: Skin is warm and dry.  Psychiatric: She has a normal mood and affect. Her behavior is normal.          Assessment & Plan:  DM with neuropathy- well controlled on current regimen. Based on her age I want to keep her hemoglobin A1c under 8. She's very fragile diabetic and swings from 300 one day down to 75 the next day. Try to continue to keep that is consistent as possible and continue to  work on consistency with medications as she does struggle with this. Her daughter is very helpful and provides as much assistance so she can.  Gait Instability- recommend using walker, especially when leaving the house and walking for longer distances..   Peripheral neuropathy-likely related to her diabetes and from her chronic back problems. She was actually supposed to get injections with Dr. Nelva Bush, orthopedist last fall. She had an active infection on her arm at that time it was canceled and she never followed back up. I would like to get her back in with Dr. ramus for further evaluation and possible injections. I think this could provide some significant relief with her back and might even help somewhat with her like symptoms. We will increase her gabapentin back to 603 times a day. She was previously on that dose but found it was difficult to take a third tab so we had decreased it back down to twice a day. Since she's had an increase in her symptoms we'll go back up to 3 times a day. Monitor for falls and sedation. Also refill her pain medication.  Leg locking/muscle spasm unclear etiology of description of the episodes above. I'm not sure what may have caused this. Certainly could be some compression on the cord which would certainly be very concerning. Episodes lasted about an hour each. I would also like to refer to neurology for further evaluation as well. It doesn't sound consistent with a typical TIA as it was bilateral and symmetric. I also want her to discuss this with the orthopedist that she sees him as well.  Erythrasma-will refill her cream previously prescribed by her dermatologist.  Insomnia-refill Restoril today.

## 2014-06-06 ENCOUNTER — Encounter: Payer: Self-pay | Admitting: *Deleted

## 2014-06-17 LAB — COMPLETE METABOLIC PANEL WITH GFR
ALT: 12 U/L (ref 0–35)
AST: 20 U/L (ref 0–37)
Albumin: 4.2 g/dL (ref 3.5–5.2)
Alkaline Phosphatase: 60 U/L (ref 39–117)
BUN: 21 mg/dL (ref 6–23)
CO2: 29 meq/L (ref 19–32)
CREATININE: 1.2 mg/dL — AB (ref 0.50–1.10)
Calcium: 9.3 mg/dL (ref 8.4–10.5)
Chloride: 103 mEq/L (ref 96–112)
GFR, EST AFRICAN AMERICAN: 48 mL/min — AB
GFR, EST NON AFRICAN AMERICAN: 42 mL/min — AB
GLUCOSE: 211 mg/dL — AB (ref 70–99)
Potassium: 5.4 mEq/L — ABNORMAL HIGH (ref 3.5–5.3)
Sodium: 140 mEq/L (ref 135–145)
TOTAL PROTEIN: 6.2 g/dL (ref 6.0–8.3)
Total Bilirubin: 0.7 mg/dL (ref 0.2–1.2)

## 2014-06-17 LAB — CBC
HEMATOCRIT: 39.5 % (ref 36.0–46.0)
Hemoglobin: 12.9 g/dL (ref 12.0–15.0)
MCH: 31.1 pg (ref 26.0–34.0)
MCHC: 32.7 g/dL (ref 30.0–36.0)
MCV: 95.2 fL (ref 78.0–100.0)
Platelets: 259 10*3/uL (ref 150–400)
RBC: 4.15 MIL/uL (ref 3.87–5.11)
RDW: 13.5 % (ref 11.5–15.5)
WBC: 5.6 10*3/uL (ref 4.0–10.5)

## 2014-06-17 LAB — LIPID PANEL
Cholesterol: 133 mg/dL (ref 0–200)
HDL: 36 mg/dL — AB (ref >39)
LDL Cholesterol: 56 mg/dL (ref 0–99)
TRIGLYCERIDES: 204 mg/dL — AB (ref <150)
Total CHOL/HDL Ratio: 3.7 Ratio
VLDL: 41 mg/dL — ABNORMAL HIGH (ref 0–40)

## 2014-06-17 LAB — TSH: TSH: 0.515 u[IU]/mL (ref 0.350–4.500)

## 2014-06-25 ENCOUNTER — Other Ambulatory Visit: Payer: Self-pay | Admitting: Cardiovascular Disease

## 2014-06-26 ENCOUNTER — Ambulatory Visit (INDEPENDENT_AMBULATORY_CARE_PROVIDER_SITE_OTHER): Payer: Medicare Other | Admitting: Pharmacist Clinician (PhC)/ Clinical Pharmacy Specialist

## 2014-06-26 ENCOUNTER — Ambulatory Visit (INDEPENDENT_AMBULATORY_CARE_PROVIDER_SITE_OTHER): Payer: Medicare Other | Admitting: *Deleted

## 2014-06-26 DIAGNOSIS — Z7901 Long term (current) use of anticoagulants: Secondary | ICD-10-CM

## 2014-06-26 DIAGNOSIS — I442 Atrioventricular block, complete: Secondary | ICD-10-CM

## 2014-06-26 LAB — MDC_IDC_ENUM_SESS_TYPE_INCLINIC
Battery Remaining Longevity: 121 mo
Battery Voltage: 2.79 V
Lead Channel Pacing Threshold Amplitude: 0.5 V
Lead Channel Pacing Threshold Pulse Width: 0.4 ms
Lead Channel Pacing Threshold Pulse Width: 0.4 ms
Lead Channel Sensing Intrinsic Amplitude: 11.2 mV
Lead Channel Setting Pacing Amplitude: 1.5 V
Lead Channel Setting Pacing Amplitude: 2 V
Lead Channel Setting Pacing Pulse Width: 0.4 ms
Lead Channel Setting Sensing Sensitivity: 2.8 mV
MDC IDC MSMT BATTERY IMPEDANCE: 228 Ohm
MDC IDC MSMT LEADCHNL RA IMPEDANCE VALUE: 529 Ohm
MDC IDC MSMT LEADCHNL RA PACING THRESHOLD AMPLITUDE: 0.5 V
MDC IDC MSMT LEADCHNL RA SENSING INTR AMPL: 2.8 mV
MDC IDC MSMT LEADCHNL RV IMPEDANCE VALUE: 673 Ohm
MDC IDC SESS DTM: 20150701153712
MDC IDC STAT BRADY AP VP PERCENT: 69 %
MDC IDC STAT BRADY AP VS PERCENT: 0 %
MDC IDC STAT BRADY AS VP PERCENT: 28 %
MDC IDC STAT BRADY AS VS PERCENT: 2 %

## 2014-06-26 LAB — PROTIME-INR
INR: 3.24 — ABNORMAL HIGH (ref <1.50)
PROTHROMBIN TIME: 33.1 s — AB (ref 11.6–15.2)

## 2014-06-26 NOTE — Progress Notes (Signed)
Pacemaker check in clinic. Normal device function. Thresholds, sensing, impedances consistent with previous measurements. Device programmed to maximize longevity. 11 mode switches the longest > 1 hour, + coumadin.  No high ventricular rates noted. Device programmed at appropriate safety margins. Histogram distribution appropriate for patient activity level. Device programmed to optimize intrinsic conduction. Estimated longevity 10 years. Patient enrolled in remote follow-up/TTM's with [facility]. Plan to follow every 3 months remotely and see annually in office. Patient education completed.  ROV in October with Dr. Sallyanne Kuster.

## 2014-07-11 ENCOUNTER — Other Ambulatory Visit: Payer: Self-pay | Admitting: Family Medicine

## 2014-07-16 ENCOUNTER — Other Ambulatory Visit: Payer: Self-pay | Admitting: Family Medicine

## 2014-07-22 ENCOUNTER — Encounter: Payer: Self-pay | Admitting: Cardiovascular Disease

## 2014-07-24 ENCOUNTER — Telehealth: Payer: Self-pay | Admitting: Cardiovascular Disease

## 2014-07-24 NOTE — Telephone Encounter (Signed)
Wants to know if pt can stop her coumadin for 5 days before her back injection? Her injection is scheduled for 08-20-14.

## 2014-07-24 NOTE — Telephone Encounter (Signed)
Will forward for dr berry review

## 2014-07-26 ENCOUNTER — Other Ambulatory Visit: Payer: Self-pay | Admitting: Cardiovascular Disease

## 2014-07-27 LAB — PROTIME-INR
INR: 3.02 — ABNORMAL HIGH (ref <1.50)
Prothrombin Time: 31.3 seconds — ABNORMAL HIGH (ref 11.6–15.2)

## 2014-07-29 ENCOUNTER — Ambulatory Visit (INDEPENDENT_AMBULATORY_CARE_PROVIDER_SITE_OTHER): Payer: Medicare Other | Admitting: Pharmacist Clinician (PhC)/ Clinical Pharmacy Specialist

## 2014-07-29 ENCOUNTER — Telehealth: Payer: Self-pay | Admitting: Cardiovascular Disease

## 2014-07-29 DIAGNOSIS — Z7901 Long term (current) use of anticoagulants: Secondary | ICD-10-CM

## 2014-07-29 NOTE — Telephone Encounter (Signed)
Called about clarence that they called about on 7-29.15.

## 2014-07-29 NOTE — Telephone Encounter (Signed)
Dr berry is here this afternoon, will forward for his review

## 2014-07-29 NOTE — Telephone Encounter (Signed)
[name] can stop her Coumadin 5 days prior to her back injection  JJB

## 2014-07-30 ENCOUNTER — Encounter: Payer: Self-pay | Admitting: *Deleted

## 2014-07-30 NOTE — Telephone Encounter (Signed)
See previous telephone encounter.

## 2014-07-30 NOTE — Telephone Encounter (Signed)
Letter drafted and signed by Dr Gwenlyn Found.  Letter faxed to ortho.

## 2014-07-30 NOTE — Telephone Encounter (Signed)
[name], FYI.

## 2014-08-09 ENCOUNTER — Telehealth: Payer: Self-pay | Admitting: Cardiovascular Disease

## 2014-08-09 NOTE — Telephone Encounter (Signed)
Deanna is calling about a request she inquired about last week concerning the pt coming off coumadin 5 days prior to an injection that she needs soon. Please call back  Thanks

## 2014-08-09 NOTE — Telephone Encounter (Signed)
Notified Deanna-letter faxed on 07/24/14. Refaxed letter -and Tilda Burrow is aware.

## 2014-08-20 ENCOUNTER — Ambulatory Visit: Payer: Medicare Other

## 2014-08-21 ENCOUNTER — Ambulatory Visit (INDEPENDENT_AMBULATORY_CARE_PROVIDER_SITE_OTHER): Payer: Medicare Other | Admitting: Pharmacist Clinician (PhC)/ Clinical Pharmacy Specialist

## 2014-08-21 ENCOUNTER — Other Ambulatory Visit: Payer: Self-pay | Admitting: Family Medicine

## 2014-08-21 DIAGNOSIS — Z7901 Long term (current) use of anticoagulants: Secondary | ICD-10-CM

## 2014-08-21 LAB — POCT INR: INR: 1

## 2014-09-04 ENCOUNTER — Ambulatory Visit (INDEPENDENT_AMBULATORY_CARE_PROVIDER_SITE_OTHER): Payer: Medicare Other | Admitting: Family Medicine

## 2014-09-04 ENCOUNTER — Encounter: Payer: Self-pay | Admitting: Family Medicine

## 2014-09-04 ENCOUNTER — Encounter: Payer: Self-pay | Admitting: Internal Medicine

## 2014-09-04 VITALS — BP 128/69 | HR 81 | Wt 182.0 lb

## 2014-09-04 DIAGNOSIS — Z23 Encounter for immunization: Secondary | ICD-10-CM

## 2014-09-04 DIAGNOSIS — I2510 Atherosclerotic heart disease of native coronary artery without angina pectoris: Secondary | ICD-10-CM

## 2014-09-04 DIAGNOSIS — R252 Cramp and spasm: Secondary | ICD-10-CM

## 2014-09-04 DIAGNOSIS — E875 Hyperkalemia: Secondary | ICD-10-CM

## 2014-09-04 DIAGNOSIS — E1149 Type 2 diabetes mellitus with other diabetic neurological complication: Secondary | ICD-10-CM

## 2014-09-04 DIAGNOSIS — I10 Essential (primary) hypertension: Secondary | ICD-10-CM

## 2014-09-04 LAB — POCT GLYCOSYLATED HEMOGLOBIN (HGB A1C): Hemoglobin A1C: 8.5

## 2014-09-04 LAB — POCT UA - MICROALBUMIN
CREATININE, POC: 300 mg/dL
Microalbumin Ur, POC: 80 mg/L

## 2014-09-04 MED ORDER — INSULIN ASPART 100 UNIT/ML FLEXPEN: 8.0000 [IU] | PEN_INJECTOR | Freq: Three times a day (TID) | SUBCUTANEOUS | Status: DC

## 2014-09-04 MED ORDER — NITROFURANTOIN MONOHYD MACRO 100 MG PO CAPS: 100.0000 mg | ORAL_CAPSULE | Freq: Two times a day (BID) | ORAL | Status: DC

## 2014-09-04 MED ORDER — INSULIN GLARGINE 300 UNIT/ML ~~LOC~~ SOPN: 44.0000 [IU] | PEN_INJECTOR | Freq: Every day | SUBCUTANEOUS | Status: DC

## 2014-09-04 MED ORDER — HYDROCODONE-ACETAMINOPHEN 5-325 MG PO TABS: 1.0000 | ORAL_TABLET | Freq: Four times a day (QID) | ORAL | Status: DC | PRN

## 2014-09-04 NOTE — Assessment & Plan Note (Addendum)
Not well controlled. Increase Lantus to 24 units twice a day. I actually think she'll be a good candidate for Toujeo. That way she can do it once a day and she would actually get more insulin in her pain. Sometimes she tries to stretch her insulin out because of the cost. Continue current regimen. Due for repeat BMP since she did have elevated potassium a last office visit.

## 2014-09-04 NOTE — Assessment & Plan Note (Signed)
She still having some problems with leg cramping. Had been fairly well controlled and toe recently. We do need to recheck her potassium. She or he takes a B complex as there is some evidence that vitamin D 6 can help with cramping. The alternative would be to consider putting her on a calcium channel blocker as a trial to see if it would be helpful. She wants to think about it between now and her followup visit.

## 2014-09-04 NOTE — Assessment & Plan Note (Signed)
No recent chest pain or shortness of breath. She is on aspirin, beta blocker, statin.

## 2014-09-04 NOTE — Assessment & Plan Note (Addendum)
Uncontrolled. Hemoglobin A1c is up to 8.5 today. Previous was under 8.  Increase Lantus to 24 units BID.   Move novolog to dinner time.

## 2014-09-04 NOTE — Progress Notes (Signed)
   Subjective:    Patient ID: [name], female    DOB: Nov 10, 1928, 80 y.o.   MRN: [id number]  Diabetes   Diabetes - no hypoglycemic events. No wounds or sores that are not healing well. No increased thirst or urination. Checking glucose at home. Taking medications as prescribed without any side effects.  Had a shot in her back about 2 weeks ago.  Using Lantus 22 units BID.  Used her novolog   Hypertension- Pt denies chest pain, SOB, dizziness, or heart palpitations.  Taking meds as directed w/o problems.  Denies medication side effects.    CAD- no recent chest pain, shortness of breath. She is on a statin daily. She also is on metoprolol.  Hyperkalemia-on last the lab works her potassium was elevated. She was supposed to repeat in one week.  Lab Results  Component Value Date   TSH 0.515 06/17/2014    Leg cramps -has been fairly well controlled until recently. Last night it was extremely painful to the point that brought tears to her eyes.  Cystitis - she's having recurrent cystitis. She would like a refill on her Macrodantin. She said to the beach this weekend.  Review of Systems     Objective:   Physical Exam  Constitutional: She is oriented to person, place, and time. She appears well-developed and well-nourished.  HENT:  Head: Normocephalic and atraumatic.  Cardiovascular: Normal rate, regular rhythm and normal heart sounds.   Pulmonary/Chest: Effort normal and breath sounds normal.  Neurological: She is alert and oriented to person, place, and time.  Skin: Skin is warm and dry.  Psychiatric: She has a normal mood and affect. Her behavior is normal.          Assessment & Plan:  Cystitis-refill of the Macrodantin. If symptoms not improving then followup for urinalysis.

## 2014-09-04 NOTE — Addendum Note (Signed)
Addended by: Narda Rutherford on: 09/04/2014 05:15 PM   Modules accepted: Orders

## 2014-09-05 ENCOUNTER — Other Ambulatory Visit: Payer: Self-pay | Admitting: Cardiovascular Disease

## 2014-09-06 ENCOUNTER — Other Ambulatory Visit: Payer: Self-pay | Admitting: Family Medicine

## 2014-09-06 ENCOUNTER — Ambulatory Visit (INDEPENDENT_AMBULATORY_CARE_PROVIDER_SITE_OTHER): Payer: Medicare Other | Admitting: Pharmacist Clinician (PhC)/ Clinical Pharmacy Specialist

## 2014-09-06 DIAGNOSIS — E875 Hyperkalemia: Secondary | ICD-10-CM

## 2014-09-06 DIAGNOSIS — N183 Chronic kidney disease, stage 3 unspecified: Secondary | ICD-10-CM

## 2014-09-06 DIAGNOSIS — Z7901 Long term (current) use of anticoagulants: Secondary | ICD-10-CM

## 2014-09-06 LAB — PROTIME-INR
INR: 4.39 — ABNORMAL HIGH (ref <1.50)
PROTHROMBIN TIME: 41.9 s — AB (ref 11.6–15.2)

## 2014-09-06 LAB — BASIC METABOLIC PANEL WITH GFR
BUN: 19 mg/dL (ref 6–23)
CALCIUM: 9.6 mg/dL (ref 8.4–10.5)
CHLORIDE: 108 meq/L (ref 96–112)
CO2: 26 meq/L (ref 19–32)
Creat: 1.18 mg/dL — ABNORMAL HIGH (ref 0.50–1.10)
GFR, Est African American: 49 mL/min — ABNORMAL LOW
GFR, Est Non African American: 42 mL/min — ABNORMAL LOW
GLUCOSE: 151 mg/dL — AB (ref 70–99)
Potassium: 5.6 mEq/L — ABNORMAL HIGH (ref 3.5–5.3)
Sodium: 143 mEq/L (ref 135–145)

## 2014-09-30 ENCOUNTER — Other Ambulatory Visit: Payer: Self-pay | Admitting: Family Medicine

## 2014-10-06 ENCOUNTER — Other Ambulatory Visit: Payer: Self-pay | Admitting: Family Medicine

## 2014-10-08 ENCOUNTER — Other Ambulatory Visit: Payer: Self-pay | Admitting: Cardiovascular Disease

## 2014-10-08 LAB — PROTIME-INR
INR: 3.09 — ABNORMAL HIGH (ref <1.50)
Prothrombin Time: 31.9 seconds — ABNORMAL HIGH (ref 11.6–15.2)

## 2014-10-09 ENCOUNTER — Ambulatory Visit (INDEPENDENT_AMBULATORY_CARE_PROVIDER_SITE_OTHER): Payer: Medicare Other | Admitting: Pharmacist Clinician (PhC)/ Clinical Pharmacy Specialist

## 2014-10-21 ENCOUNTER — Other Ambulatory Visit: Payer: Self-pay

## 2014-10-21 MED ORDER — AMBULATORY NON FORMULARY MEDICATION: Status: DC

## 2014-10-23 ENCOUNTER — Encounter: Payer: Self-pay | Admitting: Family Medicine

## 2014-10-23 ENCOUNTER — Encounter: Payer: Self-pay | Admitting: *Deleted

## 2014-10-23 ENCOUNTER — Ambulatory Visit (INDEPENDENT_AMBULATORY_CARE_PROVIDER_SITE_OTHER): Payer: Medicare Other | Admitting: Family Medicine

## 2014-10-23 VITALS — BP 131/69 | HR 94 | Ht 67.0 in | Wt 182.0 lb

## 2014-10-23 DIAGNOSIS — R5383 Other fatigue: Secondary | ICD-10-CM

## 2014-10-23 DIAGNOSIS — R197 Diarrhea, unspecified: Secondary | ICD-10-CM

## 2014-10-23 DIAGNOSIS — K589 Irritable bowel syndrome without diarrhea: Secondary | ICD-10-CM

## 2014-10-23 DIAGNOSIS — E039 Hypothyroidism, unspecified: Secondary | ICD-10-CM

## 2014-10-23 DIAGNOSIS — G4700 Insomnia, unspecified: Secondary | ICD-10-CM

## 2014-10-23 LAB — CBC WITH DIFFERENTIAL/PLATELET
BASOS ABS: 0.2 10*3/uL — AB (ref 0.0–0.1)
BASOS PCT: 2 % — AB (ref 0–1)
EOS ABS: 0.2 10*3/uL (ref 0.0–0.7)
EOS PCT: 3 % (ref 0–5)
HCT: 41.4 % (ref 36.0–46.0)
Hemoglobin: 13.5 g/dL (ref 12.0–15.0)
LYMPHS ABS: 1.2 10*3/uL (ref 0.7–4.0)
Lymphocytes Relative: 15 % (ref 12–46)
MCH: 31.6 pg (ref 26.0–34.0)
MCHC: 32.6 g/dL (ref 30.0–36.0)
MCV: 97 fL (ref 78.0–100.0)
Monocytes Absolute: 0.6 10*3/uL (ref 0.1–1.0)
Monocytes Relative: 8 % (ref 3–12)
NEUTROS PCT: 72 % (ref 43–77)
Neutro Abs: 5.8 10*3/uL (ref 1.7–7.7)
PLATELETS: 307 10*3/uL (ref 150–400)
RBC: 4.27 MIL/uL (ref 3.87–5.11)
RDW: 13.5 % (ref 11.5–15.5)
WBC: 8 10*3/uL (ref 4.0–10.5)

## 2014-10-23 LAB — COMPLETE METABOLIC PANEL WITH GFR
ALBUMIN: 4.4 g/dL (ref 3.5–5.2)
ALT: 13 U/L (ref 0–35)
AST: 27 U/L (ref 0–37)
Alkaline Phosphatase: 61 U/L (ref 39–117)
BUN: 13 mg/dL (ref 6–23)
CALCIUM: 9.5 mg/dL (ref 8.4–10.5)
CHLORIDE: 105 meq/L (ref 96–112)
CO2: 21 mEq/L (ref 19–32)
Creat: 1.37 mg/dL — ABNORMAL HIGH (ref 0.50–1.10)
GFR, Est African American: 41 mL/min — ABNORMAL LOW
GFR, Est Non African American: 35 mL/min — ABNORMAL LOW
Glucose, Bld: 208 mg/dL — ABNORMAL HIGH (ref 70–99)
POTASSIUM: 5.4 meq/L — AB (ref 3.5–5.3)
Sodium: 140 mEq/L (ref 135–145)
Total Bilirubin: 0.8 mg/dL (ref 0.2–1.2)
Total Protein: 6.9 g/dL (ref 6.0–8.3)

## 2014-10-23 LAB — POCT URINALYSIS DIPSTICK
Bilirubin, UA: NEGATIVE
Glucose, UA: NEGATIVE
KETONES UA: NEGATIVE
Nitrite, UA: NEGATIVE
PROTEIN UA: NEGATIVE
Spec Grav, UA: 1.025
Urobilinogen, UA: 0.2
pH, UA: 5.5

## 2014-10-23 LAB — TSH: TSH: 0.633 u[IU]/mL (ref 0.350–4.500)

## 2014-10-23 MED ORDER — AMBULATORY NON FORMULARY MEDICATION: Status: DC

## 2014-10-23 MED ORDER — ZOLPIDEM TARTRATE 5 MG PO TABS: 2.5000 mg | ORAL_TABLET | Freq: Every evening | ORAL | Status: DC | PRN

## 2014-10-23 MED ORDER — HYOSCYAMINE SULFATE ER 0.375 MG PO TB12: 0.3750 mg | ORAL_TABLET | Freq: Two times a day (BID) | ORAL | Status: DC | PRN

## 2014-10-23 NOTE — Addendum Note (Signed)
Addended by: Teddy Spike on: 10/23/2014 04:57 PM   Modules accepted: Orders

## 2014-10-23 NOTE — Progress Notes (Signed)
Subjective:    Patient ID: [name], female    DOB: Oct 31, 1928, 80 y.o.   MRN: [id number]  HPI Very fatigued for a few weeks.  She has had a loss of appetite.  She has not had a cold or cough.  NO fever, chills or sweats.  IBS has been flaring as well.  Didn't make it to church the last 2 Sundays.  No blood in the urine or stool. Has had some urinary frequency. No dysuria.  NO cough. No belly pain.  Arthritis has been inflammed.  She is taking her medications and no hypoglycemic events. No swellin or edema.   Diabetes - she is out of lancets to check her sugar.    Insomnia-she feels like the Restoril is no longer working. She would like to try something different like possibly Ambien.  Review of Systems  BP 131/69  Pulse 94  Ht 5\' 7"  (1.702 m)  Wt 182 lb (82.555 kg)  BMI 28.50 kg/m2    Allergies  Allergen Reactions  . Ace Inhibitors     REACTION: cough  . Fosinopril Sodium     REACTION: Cough  . Sulfamethoxazole-Trimethoprim     REACTION: Unspecified    Past Medical History  Diagnosis Date  . History of kidney stones   . Atrial fibrillation 1/11     Dr Gwenlyn Found  . Ulcer     Gastric  . Esophageal stricture   . Hiatal hernia   . Diarrhea   . Memory loss   . Heart failure     chronic diatolic- 2 D echo 6/28 EF 60%  . Microalbuminuria   . Anemia     Pernicious  . Diabetes mellitus     w/neuro manifestations, type II  . Chronic kidney disease     chronic, stage III  . Cystitis   . Osteoarthrosis involving multiple sites   . Swelling     limb, left more than right ankle   . Back pain   . Peptic ulcer     unspec. w/o obstruction  . Senile osteoporosis   . IBS (irritable bowel syndrome)   . Hypertension   . Tubular adenoma of colon   . GERD (gastroesophageal reflux disease)   . Heart murmur   . Hyperlipidemia   . Thyroid disease   . Pancreatitis   . H/O cardiac catheterization 2010    normal coronary arteries, EF 60%  . Hx of echocardiogram 04/04/13   EF 55-60%, mild MR  . PAF (paroxysmal atrial fibrillation)     Last DCCV 12/29/2009  . Chronic anticoagulation     on coumadin  . Symptomatic bradycardia 01/2010    CHB, temp pacer and then Medtronic PPM  . RBBB   . Non-stress test nonreactive 11/14    Lexiscan myoview negative for ischemia    Past Surgical History  Procedure Laterality Date  . Appendectomy    . Egd- esophageal stricture      gastric ulcer  . Carotid dopplers- no significant stenosis  2007  . Tonsillectomy and adenoidectomy    . Cataract extraction      both eyes  . Pacemaker placement  2/11    medtronic dual chamber  . Cardiac catheterization  2010    normal coronary arteries    History   Social History  . Marital Status: Widowed    Spouse Name: N/A    Number of Children: N/A  . Years of Education: N/A   Occupational History  .  Not on file.   Social History Main Topics  . Smoking status: Never Smoker   . Smokeless tobacco: Not on file  . Alcohol Use: No  . Drug Use: No  . Sexual Activity: No   Other Topics Concern  . Not on file   Social History Narrative  . No narrative on file    Family History  Problem Relation Age of Onset  . Diabetes Mother   . Prostate cancer Father   . Heart attack Father   . Colon cancer Sister   . Esophageal cancer Neg Hx   . Rectal cancer Neg Hx   . Stomach cancer Neg Hx     Outpatient Encounter Prescriptions as of 10/23/2014  Medication Sig  . albuterol (PROAIR HFA) 108 (90 BASE) MCG/ACT inhaler Inhale 2 puffs into the lungs 2 (two) times daily.  . AMBULATORY NON FORMULARY MEDICATION Accu-Check Compact Plus Test Strips in Drum - Diagnosis: E10.65, Blood sugars need to be checked 3 times daily  . aspirin 81 MG tablet Take 81 mg by mouth as needed.   . Calcium Carbonate-Vitamin D 500-125 MG-UNIT TABS Take 1 tablet by mouth 2 (two) times daily with a meal.    . clotrimazole-betamethasone (LOTRISONE) cream Apply 1 application topically 2 (two) times daily.  .  furosemide (LASIX) 40 MG tablet Take 20 mg by mouth daily.   Marland Kitchen gabapentin (NEURONTIN) 600 MG tablet Take 1 tablet (600 mg total) by mouth 3 (three) times daily.  Marland Kitchen glucagon 1 MG injection Inject 1 mg into the vein once as needed. Once as needed if found unresponsive and not able to eat or drink to get sugar up.  Marland Kitchen HYDROcodone-acetaminophen (NORCO/VICODIN) 5-325 MG per tablet Take 1 tablet by mouth every 6 (six) hours as needed. Take with food  . ibuprofen (ADVIL,MOTRIN) 200 MG tablet Take 400 mg by mouth as needed.  . insulin aspart (NOVOLOG FLEXPEN) 100 UNIT/ML FlexPen Inject 8 Units into the skin 3 (three) times daily with meals.  . Insulin Glargine (TOUJEO SOLOSTAR) 300 UNIT/ML SOPN Inject 44 Units into the skin at bedtime.  . Insulin Syringe-Needle U-100 (EASY COMFORT INSULIN SYRINGE) 30G X 5/16" 0.5 ML MISC by Does not apply route as directed.    Marland Kitchen levothyroxine (SYNTHROID, LEVOTHROID) 100 MCG tablet TAKE ONE TABLET BY MOUTH ONE TIME DAILY   . meclizine (ANTIVERT) 25 MG tablet Take 1 tablet (25 mg total) by mouth 3 (three) times daily as needed for dizziness or nausea.  . metoprolol tartrate (LOPRESSOR) 25 MG tablet TAKE ONE TABLET BY MOUTH TWICE DAILY   . nitrofurantoin, macrocrystal-monohydrate, (MACROBID) 100 MG capsule Take 1 capsule (100 mg total) by mouth 2 (two) times daily.  . pantoprazole (PROTONIX) 40 MG tablet TAKE ONE TABLET BY MOUTH ONE TIME DAILY   . pravastatin (PRAVACHOL) 40 MG tablet TAKE ONE TABLET BY MOUTH NIGHTLY AT BEDTIME   . temazepam (RESTORIL) 15 MG capsule Take 1 capsule by mouth once nightly at bedtime as needed for sleep  . warfarin (COUMADIN) 2.5 MG tablet Take 2.5 mg by mouth daily. Tuesday, Thursday, Saturday and Sunday  . warfarin (COUMADIN) 5 MG tablet Take 5 mg by mouth daily. Monday, Wednesday, Friday  . [DISCONTINUED] AMBULATORY NON FORMULARY MEDICATION Accu-Check Compact Plus Test Strips - Diagnosis: E10.65, Blood sugars need to be checked 3 times daily  .  hyoscyamine (LEVBID) 0.375 MG 12 hr tablet Take 1 tablet (0.375 mg total) by mouth every 12 (twelve) hours as needed.  . [DISCONTINUED] Ospemifene (  OSPHENA) 60 MG TABS Take 1 tablet by mouth daily.          Objective:   Physical Exam  Constitutional: She is oriented to person, place, and time. She appears well-developed and well-nourished.  HENT:  Head: Normocephalic and atraumatic.  Cardiovascular: Normal rate, regular rhythm and normal heart sounds.   Pulmonary/Chest: Effort normal and breath sounds normal.  Neurological: She is alert and oriented to person, place, and time.  Skin: Skin is warm and dry.  Psychiatric: She has a normal mood and affect. Her behavior is normal.          Assessment & Plan:  Extreme fatigue - will check for anemia and for change in thyroid. She has had some urinary frequency so will check a UA. Will check CBC to eval for anemia. She has hx of iron def anemia.  No blood in stool or urine.    IBS- Flaring. She says immodium is too constipating so really doesn't like to take it.  Can try hyosciamine for pain and cramping. Will check for stool culture.  C diff.   Diabetes - Rx was sent for lancet 2 days ago but incorrect quantity so will resend today for 300.    Insomnia-we'll discontinue Restoril and have her try low-dose Ambien. We'll give her a 5 mg tab encouraged her to cut half and see if she does well with half of a child. Use just as needed and sparingly.

## 2014-10-24 ENCOUNTER — Ambulatory Visit: Payer: Medicare Other | Admitting: Family Medicine

## 2014-10-24 NOTE — Addendum Note (Signed)
Addended by: Terance Hart on: 10/24/2014 09:52 AM   Modules accepted: Orders

## 2014-10-25 ENCOUNTER — Other Ambulatory Visit: Payer: Self-pay | Admitting: Family Medicine

## 2014-10-25 ENCOUNTER — Telehealth: Payer: Self-pay | Admitting: *Deleted

## 2014-10-25 NOTE — Telephone Encounter (Signed)
Spoke w/fran about rx for levbid for IBS she stated that pt could not afford to pay $75.00. She suggested

## 2014-10-26 LAB — URINE CULTURE: Colony Count: >=100000

## 2014-10-28 ENCOUNTER — Encounter: Payer: Self-pay | Admitting: Family Medicine

## 2014-11-05 ENCOUNTER — Other Ambulatory Visit: Payer: Self-pay | Admitting: Cardiovascular Disease

## 2014-11-05 LAB — PROTIME-INR
INR: 1.31 (ref <1.50)
PROTHROMBIN TIME: 16.3 s — AB (ref 11.6–15.2)

## 2014-11-06 ENCOUNTER — Ambulatory Visit (INDEPENDENT_AMBULATORY_CARE_PROVIDER_SITE_OTHER): Payer: Medicare Other | Admitting: Pharmacist Clinician (PhC)/ Clinical Pharmacy Specialist

## 2014-11-11 ENCOUNTER — Telehealth: Payer: Self-pay | Admitting: Cardiovascular Disease

## 2014-11-11 NOTE — Telephone Encounter (Signed)
New problem   [name] want to know why pt has to come in the office for device check and not over the phone. Please call daughter

## 2014-11-11 NOTE — Telephone Encounter (Signed)
Spoke w/ pt's daughter. Appt should have been made to see Dr. Sallyanne Kuster at [facility]. I cancelled device clinic appt at [facility] location and rescheduled appt w/ Dr. Loletha Grayer. at his office. Pt will send a manual transmission instead.

## 2014-11-14 ENCOUNTER — Other Ambulatory Visit: Payer: Self-pay | Admitting: Family Medicine

## 2014-11-16 ENCOUNTER — Other Ambulatory Visit: Payer: Self-pay | Admitting: Family Medicine

## 2014-11-20 ENCOUNTER — Other Ambulatory Visit: Payer: Self-pay | Admitting: Family Medicine

## 2014-11-24 ENCOUNTER — Other Ambulatory Visit: Payer: Self-pay | Admitting: Family Medicine

## 2014-11-25 ENCOUNTER — Other Ambulatory Visit: Payer: Self-pay | Admitting: *Deleted

## 2014-11-25 MED ORDER — INSULIN GLARGINE 100 UNIT/ML SOLOSTAR PEN: PEN_INJECTOR | SUBCUTANEOUS | Status: DC

## 2014-11-25 NOTE — Progress Notes (Signed)
Blondina called that her lantus 22u was denied for refill and we went over her med list in its entirety and she said that she still takes it and has never used toujeo which I left on her active med list. I sent a refill to [facility] for the lantus. Margette Fast, CMA

## 2014-11-28 ENCOUNTER — Other Ambulatory Visit: Payer: Self-pay | Admitting: Cardiovascular Disease

## 2014-11-29 ENCOUNTER — Ambulatory Visit (INDEPENDENT_AMBULATORY_CARE_PROVIDER_SITE_OTHER): Payer: Medicare Other | Admitting: Pharmacist Clinician (PhC)/ Clinical Pharmacy Specialist

## 2014-11-29 LAB — PROTIME-INR
INR: 3.31 — AB (ref <1.50)
PROTHROMBIN TIME: 33.6 s — AB (ref 11.6–15.2)

## 2014-12-04 ENCOUNTER — Ambulatory Visit: Payer: Medicare Other | Admitting: Family Medicine

## 2014-12-06 ENCOUNTER — Other Ambulatory Visit: Payer: Self-pay | Admitting: Family Medicine

## 2014-12-25 ENCOUNTER — Encounter: Payer: Self-pay | Admitting: Family Medicine

## 2014-12-25 ENCOUNTER — Ambulatory Visit (INDEPENDENT_AMBULATORY_CARE_PROVIDER_SITE_OTHER): Payer: Medicare Other | Admitting: Family Medicine

## 2014-12-25 VITALS — BP 155/78 | HR 84 | Wt 180.0 lb

## 2014-12-25 DIAGNOSIS — R21 Rash and other nonspecific skin eruption: Secondary | ICD-10-CM

## 2014-12-25 DIAGNOSIS — R159 Full incontinence of feces: Secondary | ICD-10-CM

## 2014-12-25 DIAGNOSIS — R350 Frequency of micturition: Secondary | ICD-10-CM

## 2014-12-25 DIAGNOSIS — K589 Irritable bowel syndrome without diarrhea: Secondary | ICD-10-CM

## 2014-12-25 DIAGNOSIS — E1149 Type 2 diabetes mellitus with other diabetic neurological complication: Secondary | ICD-10-CM

## 2014-12-25 LAB — POCT URINALYSIS DIPSTICK
BILIRUBIN UA: NEGATIVE
GLUCOSE UA: NEGATIVE
KETONES UA: NEGATIVE
NITRITE UA: NEGATIVE
SPEC GRAV UA: 1.015
Urobilinogen, UA: 0.2
pH, UA: 5.5

## 2014-12-25 LAB — POCT GLYCOSYLATED HEMOGLOBIN (HGB A1C): HEMOGLOBIN A1C: 7.8

## 2014-12-25 MED ORDER — INSULIN LISPRO 100 UNIT/ML (KWIKPEN): 8.0000 [IU] | PEN_INJECTOR | Freq: Three times a day (TID) | SUBCUTANEOUS | Status: DC

## 2014-12-25 MED ORDER — HYDROCODONE-ACETAMINOPHEN 5-325 MG PO TABS: 1.0000 | ORAL_TABLET | Freq: Four times a day (QID) | ORAL | Status: DC | PRN

## 2014-12-25 MED ORDER — PANTOPRAZOLE SODIUM 40 MG PO TBEC: 40.0000 mg | DELAYED_RELEASE_TABLET | Freq: Every day | ORAL | Status: DC

## 2014-12-25 MED ORDER — INSULIN GLARGINE 300 UNIT/ML ~~LOC~~ SOPN: 44.0000 [IU] | PEN_INJECTOR | Freq: Every day | SUBCUTANEOUS | Status: DC

## 2014-12-25 MED ORDER — OXYCODONE-ACETAMINOPHEN 5-325 MG PO TABS: 1.0000 | ORAL_TABLET | Freq: Four times a day (QID) | ORAL | Status: AC | PRN

## 2014-12-25 MED ORDER — CLOTRIMAZOLE-BETAMETHASONE 1-0.05 % EX CREA: 1.0000 "application " | TOPICAL_CREAM | Freq: Two times a day (BID) | CUTANEOUS | Status: DC

## 2014-12-25 MED ORDER — TRIAMCINOLONE ACETONIDE 0.5 % EX OINT: 1.0000 "application " | TOPICAL_OINTMENT | Freq: Every day | CUTANEOUS | Status: DC | PRN

## 2014-12-25 MED ORDER — CIPROFLOXACIN HCL 500 MG PO TABS: 500.0000 mg | ORAL_TABLET | Freq: Two times a day (BID) | ORAL | Status: AC

## 2014-12-25 NOTE — Progress Notes (Signed)
   Subjective:    Patient ID: [name], female    DOB: [birth year], 80 y.o.   MRN: [id number]  HPI Diabetes - no hypoglycemic events. No wounds or sores that are not healing well. No increased thirst or urination. Checking glucose at home. Taking medications as prescribed without any side effects. Will have to change her novolog to humalog. Using 22 units of Lantus. Still trying to get the mealtime insulin and for dinner time but often forgets. She says overall her home numbers have been running much better around 200 versus previously running in the 300 range.  Has been urinary frequent for the last couple of week. No hematuria or dysuria. Has been having a lot of lose stools. Getting fatigued after about 20 min of activity.  No fever, chills or sweats.   Rash on the back of her right upper calf for several months. No itching or pain or tenderness.  She says it really has not bothered her in anyway. If she had not seen it she wouldn't know it was there. She has not tried any over-the-counter treatments.  Still having frequent stool urgency. She says she takes Imodium it actually causes her not to go. She's having episodes of fecal incontinence. Starting to affect her daily living.  Review of Systems     Objective:   Physical Exam  Constitutional: She is oriented to person, place, and time. She appears well-developed and well-nourished.  HENT:  Head: Normocephalic and atraumatic.  Cardiovascular: Normal rate, regular rhythm and normal heart sounds.   Pulmonary/Chest: Effort normal and breath sounds normal.  Musculoskeletal:       Legs: Neurological: She is alert and oriented to person, place, and time.  Skin: Skin is warm and dry. Rash noted.  Psychiatric: She has a normal mood and affect. Her behavior is normal.          Assessment & Plan:  DM- Much improved. Has only had one low of 70 about a week ago. Will continue with current regimen. I'm glad her A1c is now down under 8  which is fantastic progress. She has had a low some little but hesitant to continue to increase her Lantus at this point. Just want her to try to be more consistent with the mealtime insulin for dinner. Follow-up in 3 months. Next  Rash-unclear etiology. Does not look like a contact dermatitis or a superficial phlebitis and is not itchy like eczema. We'll try a topical steroid cream. If not responding after 2 weeks and recommend that she see her dermatologist, Dr. Allyson Sabal. KOH collected and sent.   Urinary frequency-we'll do urinalysis to test for UTI today. UA + for blood and leuk. Will tx with Cipro x 3 days.    Chronic diarrhea with stool incontinence -encourage her to try a half a tab of Immodium and see if this helps but doesn't constipate her. Also encouraged her to eat a small half a cup of rice daily to  help bind stools. May want to even avoid dairy for 3 weeks. Eats cheese and yogurt almost daily.  Explained that some people develop lactose intolerance as they get older. F/U in 1 mo for diarrhea.

## 2014-12-25 NOTE — Addendum Note (Signed)
Addended by: Teddy Spike on: 12/25/2014 03:06 PM   Modules accepted: Orders

## 2014-12-25 NOTE — Patient Instructions (Signed)
Recommend a trial of culturelle probiotic for your diarrhea

## 2014-12-26 LAB — KOH PREP: RESULT - KOH: NONE SEEN

## 2014-12-30 ENCOUNTER — Other Ambulatory Visit: Payer: Self-pay | Admitting: Family Medicine

## 2014-12-30 ENCOUNTER — Other Ambulatory Visit: Payer: Self-pay | Admitting: Cardiovascular Disease

## 2014-12-30 DIAGNOSIS — Z7901 Long term (current) use of anticoagulants: Secondary | ICD-10-CM | POA: Diagnosis not present

## 2014-12-30 DIAGNOSIS — R5383 Other fatigue: Secondary | ICD-10-CM | POA: Diagnosis not present

## 2014-12-30 DIAGNOSIS — R197 Diarrhea, unspecified: Secondary | ICD-10-CM | POA: Diagnosis not present

## 2014-12-31 LAB — C. DIFFICILE GDH AND TOXIN A/B
C. difficile GDH: NOT DETECTED
C. difficile Toxin A/B: NOT DETECTED

## 2014-12-31 LAB — PROTIME-INR
INR: 1.04 (ref <1.50)
Prothrombin Time: 13.6 seconds (ref 11.6–15.2)

## 2015-01-01 ENCOUNTER — Ambulatory Visit (INDEPENDENT_AMBULATORY_CARE_PROVIDER_SITE_OTHER): Payer: Medicare Other | Admitting: Pharmacist Clinician (PhC)/ Clinical Pharmacy Specialist

## 2015-01-01 DIAGNOSIS — I4891 Unspecified atrial fibrillation: Secondary | ICD-10-CM

## 2015-01-02 LAB — STOOL CULTURE

## 2015-01-13 ENCOUNTER — Other Ambulatory Visit: Payer: Self-pay | Admitting: Family Medicine

## 2015-01-15 ENCOUNTER — Ambulatory Visit (INDEPENDENT_AMBULATORY_CARE_PROVIDER_SITE_OTHER): Payer: Medicare Other | Admitting: Family Medicine

## 2015-01-15 ENCOUNTER — Other Ambulatory Visit: Payer: Self-pay | Admitting: Cardiovascular Disease

## 2015-01-15 ENCOUNTER — Encounter: Payer: Self-pay | Admitting: Family Medicine

## 2015-01-15 VITALS — BP 124/69 | HR 96 | Wt 181.0 lb

## 2015-01-15 DIAGNOSIS — E1149 Type 2 diabetes mellitus with other diabetic neurological complication: Secondary | ICD-10-CM | POA: Diagnosis not present

## 2015-01-15 DIAGNOSIS — Z7901 Long term (current) use of anticoagulants: Secondary | ICD-10-CM | POA: Diagnosis not present

## 2015-01-15 DIAGNOSIS — E875 Hyperkalemia: Secondary | ICD-10-CM | POA: Diagnosis not present

## 2015-01-15 DIAGNOSIS — K529 Noninfective gastroenteritis and colitis, unspecified: Secondary | ICD-10-CM

## 2015-01-15 DIAGNOSIS — R21 Rash and other nonspecific skin eruption: Secondary | ICD-10-CM | POA: Diagnosis not present

## 2015-01-15 DIAGNOSIS — E1142 Type 2 diabetes mellitus with diabetic polyneuropathy: Secondary | ICD-10-CM | POA: Diagnosis not present

## 2015-01-15 MED ORDER — NORTRIPTYLINE HCL 25 MG PO CAPS: 25.0000 mg | ORAL_CAPSULE | Freq: Every day | ORAL | Status: DC

## 2015-01-15 NOTE — Progress Notes (Signed)
   Subjective:    Patient ID: [name], female    DOB: 26-Nov-1928, 81 y.o.   MRN: [id number]  HPI Chronic diarrhea - she has a lot of urgency.  She tried culturelle with no improvement. She hasn't tried the half tab of immodium  No blood int eh stool. Neg for c diff and neg culture.   Rash on posterior right leg/calf  Using steroid cream and not improving. It doesn't really bother her but really has not responded or changed in size. Still appears red.  DM- doing well with toujeo. She feels like the devices easier to use and she really loves being able to use it just once a day. She is due for diabetic follow-up in about 3 months.  Peripheral neuropathy- she says the thing that bothers her most is the pain and burning in her feet. She says she constantly feels like she's walking around on swollen burning feet for most like a bee sting. She feels like it throws her off balance. She is Re: On gabapentin 600 mg 3 times a day. She feels like it definitely helps but she still having significant symptoms.  Review of Systems     Objective:   Physical Exam  Constitutional: She is oriented to person, place, and time. She appears well-developed and well-nourished.  HENT:  Head: Normocephalic and atraumatic.  Cardiovascular: Normal rate, regular rhythm and normal heart sounds.   Pulmonary/Chest: Effort normal and breath sounds normal.  Musculoskeletal:       Legs: Neurological: She is alert and oriented to person, place, and time.  Skin: Skin is warm and dry.  Psychiatric: She has a normal mood and affect. Her behavior is normal.          Assessment & Plan:  Chronic diarrhea-stool cultures were negative. Negative for C. difficile. No improvement with probiotic. I did recommend a trial of a half of a tab of Imodium. She feels like when she takes a whole tab a causes constipation. If this is not help regulate her symptoms over the next week then asked her call the office back and we will  go ahead and refer her to Dr. Elicia Lamp who is her gastroenterologist. Next  Rash on posterior right calf-unclear etiology. recommend that she go ahead and getting with Dr. Allyson Sabal her dermatologist since it's not responding to topical steroid cream. It will likely need biopsy. I am concerned it also looks like there is a little bit of atrophy of the muscle tissue underneath. But I don't think that includes any actual fat atrophy.  Diabetes-doing well with Toujeo. Hopefully next A1c will look fantastic.  Peripheral neuropathy-Will add a small dose of nortriptyline in the evenings to her regimen and see if this helps control pain. We'll start with 25 mg and she can increase up to 50 if needed on her own. I would like to see her back in about 2-3onths to make sure that she's doing well on this regimen

## 2015-01-15 NOTE — Patient Instructions (Signed)
Try half a tab of immodium daily and see if helps with the stool urgency.  Please call in one week if not helping and we will get you in with Dr. Olevia Perches

## 2015-01-16 ENCOUNTER — Other Ambulatory Visit: Payer: Self-pay | Admitting: *Deleted

## 2015-01-16 ENCOUNTER — Other Ambulatory Visit: Payer: Self-pay | Admitting: Family Medicine

## 2015-01-16 DIAGNOSIS — E875 Hyperkalemia: Secondary | ICD-10-CM

## 2015-01-16 LAB — BASIC METABOLIC PANEL WITH GFR
BUN: 13 mg/dL (ref 6–23)
CO2: 30 meq/L (ref 19–32)
Calcium: 9.5 mg/dL (ref 8.4–10.5)
Chloride: 105 mEq/L (ref 96–112)
Creat: 1.24 mg/dL — ABNORMAL HIGH (ref 0.50–1.10)
GFR, EST AFRICAN AMERICAN: 46 mL/min — AB
GFR, Est Non African American: 40 mL/min — ABNORMAL LOW
Glucose, Bld: 177 mg/dL — ABNORMAL HIGH (ref 70–99)
Potassium: 6.4 mEq/L (ref 3.5–5.3)
Sodium: 141 mEq/L (ref 135–145)

## 2015-01-16 LAB — PROTIME-INR
INR: 1.87 — AB (ref <1.50)
Prothrombin Time: 21.5 seconds — ABNORMAL HIGH (ref 11.6–15.2)

## 2015-01-16 MED ORDER — SODIUM POLYSTYRENE SULFONATE PO POWD: Freq: Once | ORAL | Status: DC

## 2015-01-21 ENCOUNTER — Ambulatory Visit (INDEPENDENT_AMBULATORY_CARE_PROVIDER_SITE_OTHER): Payer: Medicare Other | Admitting: Pharmacist Clinician (PhC)/ Clinical Pharmacy Specialist

## 2015-01-23 DIAGNOSIS — E875 Hyperkalemia: Secondary | ICD-10-CM | POA: Diagnosis not present

## 2015-01-23 LAB — POTASSIUM: Potassium: 4.7 mEq/L (ref 3.5–5.3)

## 2015-01-24 DIAGNOSIS — L249 Irritant contact dermatitis, unspecified cause: Secondary | ICD-10-CM | POA: Diagnosis not present

## 2015-01-29 ENCOUNTER — Ambulatory Visit (INDEPENDENT_AMBULATORY_CARE_PROVIDER_SITE_OTHER): Payer: Medicare Other | Admitting: Cardiovascular Disease

## 2015-01-29 VITALS — BP 145/74 | HR 87 | Resp 16 | Ht 67.0 in | Wt 186.9 lb

## 2015-01-29 DIAGNOSIS — I480 Paroxysmal atrial fibrillation: Secondary | ICD-10-CM

## 2015-01-29 DIAGNOSIS — I2510 Atherosclerotic heart disease of native coronary artery without angina pectoris: Secondary | ICD-10-CM | POA: Diagnosis not present

## 2015-01-29 DIAGNOSIS — I5032 Chronic diastolic (congestive) heart failure: Secondary | ICD-10-CM

## 2015-01-29 DIAGNOSIS — I272 Other secondary pulmonary hypertension: Secondary | ICD-10-CM

## 2015-01-29 DIAGNOSIS — I10 Essential (primary) hypertension: Secondary | ICD-10-CM

## 2015-01-29 DIAGNOSIS — R55 Syncope and collapse: Secondary | ICD-10-CM

## 2015-01-29 DIAGNOSIS — I442 Atrioventricular block, complete: Secondary | ICD-10-CM | POA: Diagnosis not present

## 2015-01-29 DIAGNOSIS — I2721 Secondary pulmonary arterial hypertension: Secondary | ICD-10-CM

## 2015-01-29 NOTE — Patient Instructions (Signed)
Your physician wants you to follow-up in: 1 Year You will receive a reminder letter in the mail two months in advance. If you don't receive a letter, please call our office to schedule the follow-up appointment.  Remote monitoring is used to monitor your Pacemaker of ICD from home. This monitoring reduces the number of office visits required to check your device to one time per year. It allows [place] to keep an eye on the functioning of your device to ensure it is working properly. You are scheduled for a device check from home on 04/29/2015. You may send your transmission at any time that day. If you have a wireless device, the transmission will be sent automatically. After your physician reviews your transmission, you will receive a postcard with your next transmission date.

## 2015-01-31 ENCOUNTER — Encounter: Payer: Self-pay | Admitting: Cardiovascular Disease

## 2015-01-31 DIAGNOSIS — I442 Atrioventricular block, complete: Secondary | ICD-10-CM | POA: Insufficient documentation

## 2015-01-31 HISTORY — DX: Atrioventricular block, complete: I44.2

## 2015-01-31 NOTE — Progress Notes (Signed)
Patient ID: NOREENE BOREMAN, female   DOB: [birth year], 81 y.o.   MRN: [id number]     Reason for office visit Pacemaker follow-up, paroxysmal atrial fibrillation, complete heart block status post pacemaker, CAD  Mrs. Kuntzman is an 81 year old woman with a history of complete heart block and episodes of paroxysmal atrial fibrillation who has a dual-chamber permanent pacemaker (Medtronic, implanted 2011), hypertension, hyperlipidemia, pulmonary artery hypertension and minor coronary atherosclerosis (no stenoses by cardiac catheterization 2010). She is here for routine follow-up she denies any cardiac problems since her last appointment. She has been compliant with remote pacemaker checks via the [facility] system. Amiodarone was stopped in November 2014 for episodes of dyspnea and mild hypoxemia. No clinical atrial fibrillation has occurred since and she is still on warfarin anticoagulation.  Today pacemaker interrogation shows a 1 week long episode of atrial fibrillation that occurred in mid December. Rate control was excellent with average ventricular rates in the 60s. She was completely unaware of the arrhythmia. A few other shorter episodes of atrial fibrillation have been recorded sporadically  Otherwise normal pacemaker function was 71% atrial pacing and 100% ventricular pacing (pacemaker dependent, no detectable R waves). Allergies  Allergen Reactions  . Ace Inhibitors     REACTION: cough  . Angiotensin Receptor Blockers Other (See Comments)    Renal dx.  Nephrology has recommended against use bc of hyperkalemia  . Fosinopril Sodium     REACTION: Cough  . Sulfamethoxazole-Trimethoprim     REACTION: Unspecified    Current Outpatient Prescriptions  Medication Sig Dispense Refill  . albuterol (PROAIR HFA) 108 (90 BASE) MCG/ACT inhaler Inhale 2 puffs into the lungs 2 (two) times daily. 1 Inhaler 1  . AMBULATORY NON FORMULARY MEDICATION Accu-Check Compact Plus Test Strips in Drum - Diagnosis:  E10.65, Blood sugars need to be checked 3 times daily 300 each 11  . aspirin 81 MG tablet Take 81 mg by mouth as needed.     . Calcium Carbonate-Vitamin D 500-125 MG-UNIT TABS Take 1 tablet by mouth 2 (two) times daily with a meal.      . clotrimazole-betamethasone (LOTRISONE) cream Apply 1 application topically 2 (two) times daily. 45 g 2  . furosemide (LASIX) 40 MG tablet Take 20 mg by mouth daily as needed.     . gabapentin (NEURONTIN) 600 MG tablet TAKE ONE TABLET BY MOUTH THREE TIMES DAILY  90 tablet 2  . glucagon 1 MG injection Inject 1 mg into the vein once as needed. Once as needed if found unresponsive and not able to eat or drink to get sugar up. 1 each 2  . HYDROcodone-acetaminophen (NORCO/VICODIN) 5-325 MG per tablet Take 1 tablet by mouth every 6 (six) hours as needed. Take with food 40 tablet 0  . hyoscyamine (LEVBID) 0.375 MG 12 hr tablet Take 1 tablet (0.375 mg total) by mouth every 12 (twelve) hours as needed. 60 tablet 0  . ibuprofen (ADVIL,MOTRIN) 200 MG tablet Take 400 mg by mouth as needed.    . Insulin Glargine (TOUJEO SOLOSTAR) 300 UNIT/ML SOPN Inject 44 Units into the skin at bedtime. 2 pen 0  . insulin lispro (HUMALOG) 100 UNIT/ML KiwkPen Inject 0.08 mLs (8 Units total) into the skin 3 (three) times daily. 15 mL 11  . Insulin Syringe-Needle U-100 (EASY COMFORT INSULIN SYRINGE) 30G X 5/16" 0.5 ML MISC by Does not apply route as directed.      Marland Kitchen levothyroxine (SYNTHROID, LEVOTHROID) 100 MCG tablet TAKE ONE TABLET BY MOUTH ONE TIME DAILY  90 tablet 2  . meclizine (ANTIVERT) 25 MG tablet Take 1 tablet (25 mg total) by mouth 3 (three) times daily as needed for dizziness or nausea. 30 tablet 0  . metoprolol tartrate (LOPRESSOR) 25 MG tablet TAKE ONE TABLET BY MOUTH TWICE DAILY  60 tablet 3  . nortriptyline (PAMELOR) 25 MG capsule Take 1-2 capsules (25-50 mg total) by mouth at bedtime. 60 capsule 2  . pantoprazole (PROTONIX) 40 MG tablet Take 1 tablet (40 mg total) by mouth daily.  90 tablet 3  . pravastatin (PRAVACHOL) 40 MG tablet TAKE ONE TABLET BY MOUTH ONE TIME DAILY AT BEDTIME 90 tablet 0  . sodium polystyrene (KAYEXALATE) powder Take by mouth once. 15 grams po QD 454 g 0  . triamcinolone ointment (KENALOG) 0.5 % Apply 1 application topically daily as needed. 30 g 0  . warfarin (COUMADIN) 2.5 MG tablet Take 2.5 mg by mouth daily. Tuesday, Thursday, Saturday and Sunday    . warfarin (COUMADIN) 5 MG tablet Take 5 mg by mouth daily. Monday, Wednesday, Friday    . zolpidem (AMBIEN) 5 MG tablet Take 0.5-1 tablets (2.5-5 mg total) by mouth at bedtime as needed for sleep. 30 tablet 3   No current facility-administered medications for this visit.    Past Medical History  Diagnosis Date  . History of kidney stones   . Atrial fibrillation 1/11     Dr Gwenlyn Found  . Ulcer     Gastric  . Esophageal stricture   . Hiatal hernia   . Diarrhea   . Memory loss   . Heart failure     chronic diatolic- 2 D echo 9/50 EF 60%  . Microalbuminuria   . Anemia     Pernicious  . Diabetes mellitus     w/neuro manifestations, type II  . Chronic kidney disease     chronic, stage III  . Cystitis   . Osteoarthrosis involving multiple sites   . Swelling     limb, left more than right ankle   . Back pain   . Peptic ulcer     unspec. w/o obstruction  . Senile osteoporosis   . IBS (irritable bowel syndrome)   . Hypertension   . Tubular adenoma of colon   . GERD (gastroesophageal reflux disease)   . Heart murmur   . Hyperlipidemia   . Thyroid disease   . Pancreatitis   . H/O cardiac catheterization 2010    normal coronary arteries, EF 60%  . Hx of echocardiogram 04/04/13    EF 55-60%, mild MR  . PAF (paroxysmal atrial fibrillation)     Last DCCV 12/29/2009  . Chronic anticoagulation     on coumadin  . Symptomatic bradycardia 01/2010    CHB, temp pacer and then Medtronic PPM  . RBBB   . Non-stress test nonreactive 11/14    Lexiscan myoview negative for ischemia  . Complete heart  block 01/31/2015    Past Surgical History  Procedure Laterality Date  . Appendectomy    . Egd- esophageal stricture      gastric ulcer  . Carotid dopplers- no significant stenosis  2007  . Tonsillectomy and adenoidectomy    . Cataract extraction      both eyes  . Pacemaker placement  2/11    medtronic dual chamber  . Cardiac catheterization  2010    normal coronary arteries    Family History  Problem Relation Age of Onset  . Diabetes Mother   . Prostate cancer Father   .  Heart attack Father   . Colon cancer Sister   . Esophageal cancer Neg Hx   . Rectal cancer Neg Hx   . Stomach cancer Neg Hx     History   Social History  . Marital Status: Widowed    Spouse Name: N/A    Number of Children: N/A  . Years of Education: N/A   Occupational History  . Not on file.   Social History Main Topics  . Smoking status: Never Smoker   . Smokeless tobacco: Not on file  . Alcohol Use: No  . Drug Use: No  . Sexual Activity: No   Other Topics Concern  . Not on file   Social History Narrative    Review of systems: The patient specifically denies any chest pain at rest or with exertion, dyspnea at rest or with exertion, orthopnea, paroxysmal nocturnal dyspnea, syncope, palpitations, focal neurological deficits, intermittent claudication, lower extremity edema, unexplained weight gain, cough, hemoptysis or wheezing.  The patient also denies abdominal pain, nausea, vomiting, dysphagia, diarrhea, constipation, polyuria, polydipsia, dysuria, hematuria, frequency, urgency, abnormal bleeding or bruising, fever, chills, unexpected weight changes, mood swings, change in skin or hair texture, change in voice quality, auditory or visual problems, allergic reactions or rashes, new musculoskeletal complaints other than usual "aches and pains".   PHYSICAL EXAM BP 145/74 mmHg  Pulse 87  Resp 16  Ht 5' 7"  (1.702 m)  Wt 84.777 kg (186 lb 14.4 oz)  BMI 29.27 kg/m2  General: Alert, oriented  x3, no distress Head: no evidence of trauma, PERRL, EOMI, no exophtalmos or lid lag, no myxedema, no xanthelasma; normal ears, nose and oropharynx Neck: normal jugular venous pulsations and no hepatojugular reflux; brisk carotid pulses without delay and no carotid bruits Chest: clear to auscultation, no signs of consolidation by percussion or palpation, normal fremitus, symmetrical and full respiratory excursions, healthy subclavian pacemaker site Cardiovascular: normal position and quality of the apical impulse, regular rhythm, normal first and her doc sequelae split second heart sounds, no murmurs, rubs or gallops Abdomen: no tenderness or distention, no masses by palpation, no abnormal pulsatility or arterial bruits, normal bowel sounds, no hepatosplenomegaly Extremities: no clubbing, cyanosis or edema; 2+ radial, ulnar and brachial pulses bilaterally; 2+ right femoral, posterior tibial and dorsalis pedis pulses; 2+ left femoral, posterior tibial and dorsalis pedis pulses; no subclavian or femoral bruits Neurological: grossly nonfocal   EKG: Atrial sensed, ventricular paced  Lipid Panel     Component Value Date/Time   CHOL 133 06/17/2014 0844   TRIG 204* 06/17/2014 0844   HDL 36* 06/17/2014 0844   CHOLHDL 3.7 06/17/2014 0844   VLDL 41* 06/17/2014 0844   LDLCALC 56 06/17/2014 0844   LDLDIRECT 73 08/07/2009 0028    BMET    Component Value Date/Time   NA 141 01/15/2015 1145   K 4.7 01/23/2015 1011   CL 105 01/15/2015 1145   CO2 30 01/15/2015 1145   GLUCOSE 177* 01/15/2015 1145   BUN 13 01/15/2015 1145   BUN 21 09/22/2011   CREATININE 1.24* 01/15/2015 1145   CREATININE 1.8* 09/22/2011   CREATININE 1.65* 03/11/2011 0006   CALCIUM 9.5 01/15/2015 1145   GFRNONAA 40* 01/15/2015 1145   GFRNONAA 39* 02/04/2010 0540   GFRAA 46* 01/15/2015 1145   GFRAA * 02/04/2010 0540    48        The eGFR has been calculated using the MDRD equation. This calculation has not been validated in  all clinical situations. eGFR's persistently <60  mL/min signify possible Chronic Kidney Disease.     ASSESSMENT AND PLAN  1. Paroxysmal atrial fibrillation, asymptomatic Continue lifelong warfarin or other anticoagulant barring any bleeding complications. Possible history of amiodarone toxicity. Antiarrhythmics do not appear to be indicated.  2. Complete heart block, pacemaker dependent Normal pacemaker function, continue [facility] checks every 3 months in office yearly visits  Orders Placed This Encounter  Procedures  . EKG 12-Lead   No orders of the defined types were placed in this encounter.    Holli Humbles, MD, [place] [phone] office [phone] pager

## 2015-02-04 LAB — MDC_IDC_ENUM_SESS_TYPE_REMOTE
Battery Remaining Longevity: 114 mo
Battery Voltage: 2.79 V
Brady Statistic AP VP Percent: 71 %
Brady Statistic AP VS Percent: 0 %
Brady Statistic AS VP Percent: 29 %
Brady Statistic AS VS Percent: 0 %
Date Time Interrogation Session: 20160203184358
Lead Channel Impedance Value: 539 Ohm
Lead Channel Impedance Value: 675 Ohm
Lead Channel Pacing Threshold Amplitude: 0.5 V
Lead Channel Pacing Threshold Pulse Width: 0.4 ms
Lead Channel Pacing Threshold Pulse Width: 0.4 ms
Lead Channel Sensing Intrinsic Amplitude: 2.8 mV
Lead Channel Setting Pacing Amplitude: 1.5 V
Lead Channel Setting Pacing Amplitude: 2 V
Lead Channel Setting Sensing Sensitivity: 2.8 mV
MDC IDC MSMT BATTERY IMPEDANCE: 275 Ohm
MDC IDC MSMT LEADCHNL RV PACING THRESHOLD AMPLITUDE: 0.625 V
MDC IDC SET LEADCHNL RV PACING PULSEWIDTH: 0.4 ms

## 2015-02-07 ENCOUNTER — Encounter: Payer: Self-pay | Admitting: Cardiovascular Disease

## 2015-03-04 ENCOUNTER — Other Ambulatory Visit: Payer: Self-pay | Admitting: Cardiovascular Disease

## 2015-03-04 DIAGNOSIS — Z7901 Long term (current) use of anticoagulants: Secondary | ICD-10-CM | POA: Diagnosis not present

## 2015-03-05 LAB — PROTIME-INR
INR: 1.13 (ref <1.50)
Prothrombin Time: 14.5 seconds (ref 11.6–15.2)

## 2015-03-06 ENCOUNTER — Ambulatory Visit (INDEPENDENT_AMBULATORY_CARE_PROVIDER_SITE_OTHER): Payer: Medicare Other | Admitting: Pharmacist Clinician (PhC)/ Clinical Pharmacy Specialist

## 2015-03-06 MED ORDER — WARFARIN SODIUM 5 MG PO TABS: ORAL_TABLET | ORAL | Status: DC

## 2015-03-07 ENCOUNTER — Other Ambulatory Visit: Payer: Self-pay | Admitting: Family Medicine

## 2015-03-21 ENCOUNTER — Other Ambulatory Visit: Payer: Self-pay | Admitting: Pharmacist Clinician (PhC)/ Clinical Pharmacy Specialist

## 2015-03-21 DIAGNOSIS — I4891 Unspecified atrial fibrillation: Secondary | ICD-10-CM

## 2015-03-21 DIAGNOSIS — Z7901 Long term (current) use of anticoagulants: Secondary | ICD-10-CM

## 2015-03-22 ENCOUNTER — Emergency Department (INDEPENDENT_AMBULATORY_CARE_PROVIDER_SITE_OTHER): Payer: Medicare Other

## 2015-03-22 ENCOUNTER — Emergency Department
Admission: EM | Admit: 2015-03-22 | Discharge: 2015-03-22 | Disposition: A | Discharge status: Home / Self Care | Payer: Medicare Other | Source: Home / Self Care | Attending: Family Medicine | Admitting: Family Medicine

## 2015-03-22 DIAGNOSIS — M25571 Pain in right ankle and joints of right foot: Secondary | ICD-10-CM

## 2015-03-22 DIAGNOSIS — M7671 Peroneal tendinitis, right leg: Secondary | ICD-10-CM | POA: Diagnosis not present

## 2015-03-22 NOTE — ED Notes (Signed)
Patient complains of right ankle pain for approx one week. Denies any injury, she states just gradually getting worse and making walking difficult

## 2015-03-22 NOTE — ED Provider Notes (Signed)
CSN: [id number]     Arrival date & time 03/22/15  1628 History   First MD Initiated Contact with Patient 03/22/15 1800     Chief Complaint  Patient presents with  . Ankle Pain    Right      HPI Comments: Patient complains of right ankle pain for approx one week. Denies any injury or change in activities.  Her ankle has gradually been getting worse and making walking difficult.  Patient is a 81 y.o. female presenting with ankle pain. The history is provided by the patient and a relative.  Ankle Pain Location:  Ankle Time since incident:  1 week Injury: no   Ankle location:  R ankle Pain details:    Quality:  Aching   Radiates to:  Does not radiate   Severity:  Mild   Onset quality:  Gradual   Duration:  1 week   Timing:  Constant   Progression:  Worsening Chronicity:  New Dislocation: no   Prior injury to area:  No Relieved by:  None tried Worsened by:  Bearing weight and activity Ineffective treatments:  None tried Associated symptoms: no back pain, no decreased ROM, no fatigue, no fever, no muscle weakness, no numbness, no stiffness, no swelling and no tingling   Risk factors: obesity     Past Medical History  Diagnosis Date  . History of kidney stones   . Atrial fibrillation 1/11     Dr Gwenlyn Found  . Ulcer     Gastric  . Esophageal stricture   . Hiatal hernia   . Diarrhea   . Memory loss   . Heart failure     chronic diatolic- 2 D echo 6/25 EF 60%  . Microalbuminuria   . Anemia     Pernicious  . Diabetes mellitus     w/neuro manifestations, type II  . Chronic kidney disease     chronic, stage III  . Cystitis   . Osteoarthrosis involving multiple sites   . Swelling     limb, left more than right ankle   . Back pain   . Peptic ulcer     unspec. w/o obstruction  . Senile osteoporosis   . IBS (irritable bowel syndrome)   . Hypertension   . Tubular adenoma of colon   . GERD (gastroesophageal reflux disease)   . Heart murmur   . Hyperlipidemia   . Thyroid  disease   . Pancreatitis   . H/O cardiac catheterization 2010    normal coronary arteries, EF 60%  . Hx of echocardiogram 04/04/13    EF 55-60%, mild MR  . PAF (paroxysmal atrial fibrillation)     Last DCCV 12/29/2009  . Chronic anticoagulation     on coumadin  . Symptomatic bradycardia 01/2010    CHB, temp pacer and then Medtronic PPM  . RBBB   . Non-stress test nonreactive 11/14    Lexiscan myoview negative for ischemia  . Complete heart block 01/31/2015   Past Surgical History  Procedure Laterality Date  . Appendectomy    . Egd- esophageal stricture      gastric ulcer  . Carotid dopplers- no significant stenosis  2007  . Tonsillectomy and adenoidectomy    . Cataract extraction      both eyes  . Pacemaker placement  2/11    medtronic dual chamber  . Cardiac catheterization  2010    normal coronary arteries   Family History  Problem Relation Age of Onset  . Diabetes Mother   .  Prostate cancer Father   . Heart attack Father   . Colon cancer Sister   . Esophageal cancer Neg Hx   . Rectal cancer Neg Hx   . Stomach cancer Neg Hx    History  Substance Use Topics  . Smoking status: Never Smoker   . Smokeless tobacco: Not on file  . Alcohol Use: No   OB History    Gravida Para Term Preterm AB TAB SAB Ectopic Multiple Living   2 2        2      Review of Systems  Constitutional: Negative for fever and fatigue.  Musculoskeletal: Negative for back pain and stiffness.  All other systems reviewed and are negative.   Allergies  Ace inhibitors; Angiotensin receptor blockers; Fosinopril sodium; and Sulfamethoxazole-trimethoprim  Home Medications   Prior to Admission medications   Medication Sig Start Date End Date Taking? Authorizing Provider  albuterol (PROAIR HFA) 108 (90 BASE) MCG/ACT inhaler Inhale 2 puffs into the lungs 2 (two) times daily. 01/20/12   Hali Marry, MD  AMBULATORY NON FORMULARY MEDICATION Accu-Check Compact Plus Test Strips in [place] - Diagnosis:  E10.65, Blood sugars need to be checked 3 times daily 10/23/14   Hali Marry, MD  aspirin 81 MG tablet Take 81 mg by mouth as needed.     Historical Provider, MD  Calcium Carbonate-Vitamin D 500-125 MG-UNIT TABS Take 1 tablet by mouth 2 (two) times daily with a meal.      Historical Provider, MD  clotrimazole-betamethasone (LOTRISONE) cream Apply 1 application topically 2 (two) times daily. 12/25/14   Hali Marry, MD  furosemide (LASIX) 40 MG tablet Take 20 mg by mouth daily as needed.     Historical Provider, MD  gabapentin (NEURONTIN) 600 MG tablet TAKE ONE TABLET BY MOUTH THREE TIMES DAILY  11/20/14   Hali Marry, MD  glucagon 1 MG injection Inject 1 mg into the vein once as needed. Once as needed if found unresponsive and not able to eat or drink to get sugar up. 06/06/13   Donella Stade, PA-C  HYDROcodone-acetaminophen (NORCO/VICODIN) 5-325 MG per tablet Take 1 tablet by mouth every 6 (six) hours as needed. Take with food 12/25/14   Hali Marry, MD  hyoscyamine (LEVBID) 0.375 MG 12 hr tablet Take 1 tablet (0.375 mg total) by mouth every 12 (twelve) hours as needed. 10/23/14   Hali Marry, MD  ibuprofen (ADVIL,MOTRIN) 200 MG tablet Take 400 mg by mouth as needed.    Historical Provider, MD  Insulin Glargine (TOUJEO SOLOSTAR) 300 UNIT/ML SOPN Inject 44 Units into the skin at bedtime. 12/25/14   Hali Marry, MD  insulin lispro (HUMALOG) 100 UNIT/ML KiwkPen Inject 0.08 mLs (8 Units total) into the skin 3 (three) times daily. 12/25/14   Hali Marry, MD  Insulin Syringe-Needle U-100 (EASY COMFORT INSULIN SYRINGE) 30G X 5/16" 0.5 ML MISC by Does not apply route as directed.      Historical Provider, MD  levothyroxine (SYNTHROID, LEVOTHROID) 100 MCG tablet TAKE ONE TABLET BY MOUTH ONE TIME DAILY  10/06/14   Hali Marry, MD  metoprolol tartrate (LOPRESSOR) 25 MG tablet TAKE ONE TABLET BY MOUTH TWICE DAILY  12/06/14   Hali Marry, MD  nortriptyline (PAMELOR) 25 MG capsule Take 1-2 capsules (25-50 mg total) by mouth at bedtime. 01/15/15   Hali Marry, MD  pantoprazole (PROTONIX) 40 MG tablet Take 1 tablet (40 mg total) by mouth daily. 12/25/14  Hali Marry, MD  pravastatin (PRAVACHOL) 40 MG tablet TAKE ONE TABLET BY MOUTH NIGHTLY AT BEDTIME  03/08/15   Hali Marry, MD  sodium polystyrene (KAYEXALATE) powder Take by mouth once. 15 grams po QD 01/16/15   Hali Marry, MD  triamcinolone ointment (KENALOG) 0.5 % Apply 1 application topically daily as needed. 12/25/14   Hali Marry, MD  warfarin (COUMADIN) 5 MG tablet Take 1 tablet by mouth daily or as directed by coumadin clinic 03/06/15   Lorretta Harp, MD  zolpidem (AMBIEN) 5 MG tablet Take 0.5-1 tablets (2.5-5 mg total) by mouth at bedtime as needed for sleep. 10/23/14   Hali Marry, MD   BP 150/80 mmHg  Pulse 81  Temp(Src) 98.1 F (36.7 C) (Oral)  Ht 5\' 7"  (1.702 m)  Wt 183 lb (83.008 kg)  BMI 28.66 kg/m2  SpO2 98% Physical Exam  Constitutional: She is oriented to person, place, and time. She appears well-developed and well-nourished. No distress.  HENT:  Head: Atraumatic.  Eyes: Pupils are equal, round, and reactive to light.  Musculoskeletal:       Right ankle: She exhibits normal range of motion, no swelling, no ecchymosis, no deformity, no laceration and normal pulse. Tenderness. Lateral malleolus tenderness found.       Feet:  Right ankle: There is tenderness over the course of the right peroneal tendon and base of 5th metatarsal.  Pain is elicited with resisted eversion and resisted plantar flexion of the ankle.  Distal neurovascular function is intact.    Neurological: She is alert and oriented to person, place, and time.  Skin: Skin is warm and dry. No rash noted.  Nursing note and vitals reviewed.   ED Course  Procedures  none        Dg Ankle Complete Right  03/22/2015   CLINICAL  DATA:  Lateral malleolar ankle pain for 1 week, no trauma. Initial encounter.  EXAM: RIGHT ANKLE - COMPLETE 3+ VIEW  COMPARISON:  03/28/2013  FINDINGS: Bones are subjectively osteopenic. Mortise is symmetric. Plantar calcaneal spurring is noted. No fracture or dislocation identified. Diffuse mild soft tissue prominence is identified without focal abnormality. No radiopaque foreign body.  IMPRESSION: Allowing for osteopenia, no acute osseous abnormality is identified. If symptoms persist, consider reimaging in 7-10 days for detection of possibly radiographically occult fracture.   Electronically Signed   By: Conchita Paris M.D.   On: 03/22/2015 17:47     MDM   1. Peroneal tendonitis, right    Ace wrap applied and AirCast stirrup splint applied. Apply ice pack for 30 minutes every 1 to 2 hours today and tomorrow.  Elevate when possible.  Wear Ace wrap until swelling decreases.  Wear brace for about 2 weeks.  Begin range of motion and stretching exercises in about 5 days as per instruction sheet. Followup with Dr. Aundria Mems ([facility]) in one week.     Kandra Nicolas, MD 03/24/15 [phone]

## 2015-03-22 NOTE — Discharge Instructions (Signed)
Apply ice pack for 30 minutes every 1 to 2 hours today and tomorrow.  Elevate when possible.  Wear Ace wrap until swelling decreases.  Wear brace for about 2 weeks.  Begin range of motion and stretching exercises in about 5 days as per instruction sheet.

## 2015-03-24 ENCOUNTER — Telehealth: Payer: Self-pay | Admitting: Cardiovascular Disease

## 2015-03-24 NOTE — Telephone Encounter (Signed)
Paul from [facility] called stating ereq numbers won't cross through Epic d/t way standing orders are set up, asked if OK to fax results. Advised OK.

## 2015-03-26 ENCOUNTER — Ambulatory Visit (INDEPENDENT_AMBULATORY_CARE_PROVIDER_SITE_OTHER): Payer: Medicare Other | Admitting: Family Medicine

## 2015-03-26 ENCOUNTER — Other Ambulatory Visit: Payer: Self-pay | Admitting: Cardiovascular Disease

## 2015-03-26 ENCOUNTER — Encounter: Payer: Self-pay | Admitting: Family Medicine

## 2015-03-26 VITALS — BP 106/67 | HR 86 | Ht 67.0 in | Wt 188.0 lb

## 2015-03-26 DIAGNOSIS — Z7901 Long term (current) use of anticoagulants: Secondary | ICD-10-CM | POA: Diagnosis not present

## 2015-03-26 DIAGNOSIS — R413 Other amnesia: Secondary | ICD-10-CM | POA: Diagnosis not present

## 2015-03-26 DIAGNOSIS — E1149 Type 2 diabetes mellitus with other diabetic neurological complication: Secondary | ICD-10-CM

## 2015-03-26 DIAGNOSIS — I4891 Unspecified atrial fibrillation: Secondary | ICD-10-CM | POA: Diagnosis not present

## 2015-03-26 DIAGNOSIS — Z23 Encounter for immunization: Secondary | ICD-10-CM | POA: Diagnosis not present

## 2015-03-26 DIAGNOSIS — S86302D Unspecified injury of muscle(s) and tendon(s) of peroneal muscle group at lower leg level, left leg, subsequent encounter: Secondary | ICD-10-CM

## 2015-03-26 DIAGNOSIS — S8982XD Other specified injuries of left lower leg, subsequent encounter: Secondary | ICD-10-CM

## 2015-03-26 LAB — POCT GLYCOSYLATED HEMOGLOBIN (HGB A1C): Hemoglobin A1C: 7.9

## 2015-03-26 NOTE — Patient Instructions (Signed)
Increase Lantus to 24 units at bedtime and continue 22 in AM.

## 2015-03-26 NOTE — Progress Notes (Signed)
   Subjective:    Patient ID: [name], female    DOB: 10-Feb-1928, 81 y.o.   MRN: [id number]  HPI Diabetes - no hypoglycemic events. No wounds or sores that are not healing well. No increased thirst or urination. Checking glucose at home. Taking medications as prescribed without any side effects.  toujeo was too expensive to went back to the Lantus.   Peroneal tendonitis. Went to urgent care on Saturday and had x-rays because of persistent left foot pain. Was painful to walk on it. She was diagnosed with peroneal tendinitis. She is now wearing an Aircast. She does feel like it's very helpful in reducing her pain.  Her daughter who is here with her today expressed some concerns about her memory. She's been noticing some slight changes. The patient herself says she's been much more forgetful. She still balances her checkbook but has forgotten to mail some checks out. Daughter has also noticed that she's making comments about not getting lost in the parking lot. She also will watch a repeat showed cannot remember watching the first time even if she just saw a week or 2 ago. Several siblings were diagnosed with Alzheimer's dementia later in life and the daughter is concerned that her mom may be developing some early symptoms.  Review of Systems no swelling or fluid retention and no shortness of breath.    Objective:   Physical Exam  Constitutional: She is oriented to person, place, and time. She appears well-developed and well-nourished.  HENT:  Head: Normocephalic and atraumatic.  Cardiovascular: Normal rate, regular rhythm and normal heart sounds.   Pulmonary/Chest: Effort normal and breath sounds normal.  Neurological: She is alert and oriented to person, place, and time.  Skin: Skin is warm and dry.  Psychiatric: She has a normal mood and affect. Her behavior is normal.          Assessment & Plan:  DM- uncontrolled.  Today is 7.9.  Increase Lantus to 24 units at bedtime and  continue 22 in AM.    Peroneal tendinitis-if not significantly improved in about 10 days then recommend she follow-up with her sports medicine doctor, Dr. Aundria Mems. Next  Memory changes-recommend that she return for memory testing. Explant we typically do this in a 30 minute appointment. Sometimes will also do blood work in addition to screen for depression. There are medications on the market that can help slow the progression but do not actually improve memory. Did encourage her to do things like crossword puzzles etc. to help exercise her mind.  Prevnar 13 given today.

## 2015-03-27 ENCOUNTER — Ambulatory Visit (INDEPENDENT_AMBULATORY_CARE_PROVIDER_SITE_OTHER): Payer: Medicare Other | Admitting: Pharmacist

## 2015-03-27 LAB — PROTIME-INR
INR: 2.28 — ABNORMAL HIGH (ref <1.50)
Prothrombin Time: 25.1 seconds — ABNORMAL HIGH (ref 11.6–15.2)

## 2015-04-02 ENCOUNTER — Other Ambulatory Visit: Payer: Self-pay | Admitting: Family Medicine

## 2015-04-03 ENCOUNTER — Other Ambulatory Visit: Payer: Self-pay | Admitting: Family Medicine

## 2015-04-11 ENCOUNTER — Other Ambulatory Visit: Payer: Self-pay | Admitting: Cardiovascular Disease

## 2015-04-11 DIAGNOSIS — Z7901 Long term (current) use of anticoagulants: Secondary | ICD-10-CM | POA: Diagnosis not present

## 2015-04-11 DIAGNOSIS — I4891 Unspecified atrial fibrillation: Secondary | ICD-10-CM | POA: Diagnosis not present

## 2015-04-12 LAB — PROTIME-INR
INR: 2.53 — ABNORMAL HIGH (ref <1.50)
Prothrombin Time: 27.3 seconds — ABNORMAL HIGH (ref 11.6–15.2)

## 2015-04-14 ENCOUNTER — Ambulatory Visit (INDEPENDENT_AMBULATORY_CARE_PROVIDER_SITE_OTHER): Payer: Medicare Other | Admitting: Pharmacist Clinician (PhC)/ Clinical Pharmacy Specialist

## 2015-04-17 ENCOUNTER — Telehealth: Payer: Self-pay | Admitting: Family Medicine

## 2015-04-17 NOTE — Telephone Encounter (Signed)
Ms. Stanczak daughter called and wanted to know if we could do the memory test in May on a Wednesday. Dr Madilyn Fireman has an 11:30 opening on May 11th and you were going to talk to her to see if we could schedule her there or at 1:00pm please let me know and I will let the daughter know. Thank you - CF

## 2015-04-19 DIAGNOSIS — M722 Plantar fascial fibromatosis: Secondary | ICD-10-CM | POA: Diagnosis not present

## 2015-04-19 DIAGNOSIS — E079 Disorder of thyroid, unspecified: Secondary | ICD-10-CM | POA: Diagnosis not present

## 2015-04-19 DIAGNOSIS — Z950 Presence of cardiac pacemaker: Secondary | ICD-10-CM | POA: Diagnosis not present

## 2015-04-19 DIAGNOSIS — Z7982 Long term (current) use of aspirin: Secondary | ICD-10-CM | POA: Diagnosis not present

## 2015-04-19 DIAGNOSIS — E119 Type 2 diabetes mellitus without complications: Secondary | ICD-10-CM | POA: Diagnosis not present

## 2015-04-19 DIAGNOSIS — G629 Polyneuropathy, unspecified: Secondary | ICD-10-CM | POA: Diagnosis not present

## 2015-04-19 DIAGNOSIS — M25571 Pain in right ankle and joints of right foot: Secondary | ICD-10-CM | POA: Diagnosis not present

## 2015-04-19 DIAGNOSIS — K219 Gastro-esophageal reflux disease without esophagitis: Secondary | ICD-10-CM | POA: Diagnosis not present

## 2015-04-19 DIAGNOSIS — Z7901 Long term (current) use of anticoagulants: Secondary | ICD-10-CM | POA: Diagnosis not present

## 2015-04-19 DIAGNOSIS — Z79899 Other long term (current) drug therapy: Secondary | ICD-10-CM | POA: Diagnosis not present

## 2015-04-19 DIAGNOSIS — I10 Essential (primary) hypertension: Secondary | ICD-10-CM | POA: Diagnosis not present

## 2015-04-19 DIAGNOSIS — Z794 Long term (current) use of insulin: Secondary | ICD-10-CM | POA: Diagnosis not present

## 2015-04-19 DIAGNOSIS — M85871 Other specified disorders of bone density and structure, right ankle and foot: Secondary | ICD-10-CM | POA: Diagnosis not present

## 2015-04-19 DIAGNOSIS — M79671 Pain in right foot: Secondary | ICD-10-CM | POA: Diagnosis not present

## 2015-04-21 DIAGNOSIS — M216X2 Other acquired deformities of left foot: Secondary | ICD-10-CM | POA: Diagnosis not present

## 2015-04-21 DIAGNOSIS — M216X1 Other acquired deformities of right foot: Secondary | ICD-10-CM | POA: Diagnosis not present

## 2015-04-21 DIAGNOSIS — E1142 Type 2 diabetes mellitus with diabetic polyneuropathy: Secondary | ICD-10-CM | POA: Diagnosis not present

## 2015-04-21 DIAGNOSIS — M79671 Pain in right foot: Secondary | ICD-10-CM | POA: Diagnosis not present

## 2015-04-21 DIAGNOSIS — M76821 Posterior tibial tendinitis, right leg: Secondary | ICD-10-CM | POA: Diagnosis not present

## 2015-04-23 NOTE — Telephone Encounter (Signed)
[name] called and would like to keep 5/18 appt.[name] [place]

## 2015-04-30 ENCOUNTER — Ambulatory Visit (INDEPENDENT_AMBULATORY_CARE_PROVIDER_SITE_OTHER): Payer: Medicare Other | Admitting: *Deleted

## 2015-04-30 ENCOUNTER — Telehealth: Payer: Self-pay | Admitting: Cardiology

## 2015-04-30 DIAGNOSIS — I442 Atrioventricular block, complete: Secondary | ICD-10-CM

## 2015-04-30 NOTE — Telephone Encounter (Signed)
Spoke with pt and reminded pt of remote transmission that is due today. Pt verbalized understanding.   

## 2015-05-06 NOTE — Progress Notes (Signed)
Remote pacemaker transmission.   

## 2015-05-07 LAB — CUP PACEART REMOTE DEVICE CHECK
Battery Impedance: 323 Ohm
Battery Remaining Longevity: 111 mo
Brady Statistic AS VS Percent: 0 %
Date Time Interrogation Session: 20160504205657
Lead Channel Impedance Value: 589 Ohm
Lead Channel Impedance Value: 726 Ohm
Lead Channel Pacing Threshold Amplitude: 0.625 V
Lead Channel Pacing Threshold Pulse Width: 0.4 ms
Lead Channel Pacing Threshold Pulse Width: 0.4 ms
Lead Channel Setting Sensing Sensitivity: 2.8 mV
MDC IDC MSMT BATTERY VOLTAGE: 2.79 V
MDC IDC MSMT LEADCHNL RA SENSING INTR AMPL: 2.8 mV — AB
MDC IDC MSMT LEADCHNL RV PACING THRESHOLD AMPLITUDE: 0.5 V
MDC IDC SET LEADCHNL RA PACING AMPLITUDE: 1.5 V
MDC IDC SET LEADCHNL RV PACING AMPLITUDE: 2 V
MDC IDC SET LEADCHNL RV PACING PULSEWIDTH: 0.4 ms
MDC IDC STAT BRADY AP VP PERCENT: 63 %
MDC IDC STAT BRADY AP VS PERCENT: 0 %
MDC IDC STAT BRADY AS VP PERCENT: 37 %

## 2015-05-08 ENCOUNTER — Encounter: Payer: Self-pay | Admitting: Cardiology

## 2015-05-14 ENCOUNTER — Encounter: Payer: Self-pay | Admitting: Family Medicine

## 2015-05-14 ENCOUNTER — Ambulatory Visit (INDEPENDENT_AMBULATORY_CARE_PROVIDER_SITE_OTHER): Payer: Medicare Other | Admitting: Family Medicine

## 2015-05-14 ENCOUNTER — Telehealth: Payer: Self-pay

## 2015-05-14 ENCOUNTER — Other Ambulatory Visit: Payer: Self-pay | Admitting: Cardiovascular Disease

## 2015-05-14 ENCOUNTER — Encounter: Payer: Self-pay | Admitting: Cardiovascular Disease

## 2015-05-14 VITALS — BP 121/67 | HR 92 | Wt 192.0 lb

## 2015-05-14 DIAGNOSIS — R413 Other amnesia: Secondary | ICD-10-CM

## 2015-05-14 DIAGNOSIS — D649 Anemia, unspecified: Secondary | ICD-10-CM | POA: Diagnosis not present

## 2015-05-14 DIAGNOSIS — M722 Plantar fascial fibromatosis: Secondary | ICD-10-CM | POA: Diagnosis not present

## 2015-05-14 DIAGNOSIS — E1149 Type 2 diabetes mellitus with other diabetic neurological complication: Secondary | ICD-10-CM | POA: Diagnosis not present

## 2015-05-14 DIAGNOSIS — Z7901 Long term (current) use of anticoagulants: Secondary | ICD-10-CM | POA: Diagnosis not present

## 2015-05-14 DIAGNOSIS — I4891 Unspecified atrial fibrillation: Secondary | ICD-10-CM | POA: Diagnosis not present

## 2015-05-14 MED ORDER — FUROSEMIDE 40 MG PO TABS: 20.0000 mg | ORAL_TABLET | Freq: Every day | ORAL | Status: DC | PRN

## 2015-05-14 NOTE — Telephone Encounter (Signed)
Rx sent 

## 2015-05-14 NOTE — Telephone Encounter (Signed)
Patient would like a refill on furosemide 40 mg. Historical provider.

## 2015-05-14 NOTE — Progress Notes (Signed)
Subjective:    Patient ID: [name], female    DOB: [birth year], 81 y.o.   MRN: [id number]  HPI Diabetes - put on steroid for her foot and her sugars have been out of controll in the 400s at night and in the 200s in the AM. She is on Lantus 22 units BID.  Hx of TIA.    Had a collapsed arch of the right foot. Says  Pain was so severe she couldn't walk on her foot so she went to know about emergency department. Seeing podiatry. She is in a brace and wearing a compression stocking.  Dr. Allean Found.   Here for memory testing. Sleepin gwell. Denies any depression or anxiety sxs.   She has a couple of siblings that developed advanced dementia as they got older. Her daughter is here today because she is concerned she started to notice some changes. For example she put some sweet potato fries in the oven and forgot about them and left them in there. She is also noticing that she can watch a show on TV in the not did not remember watching it a few weeks later. Or remembering very little about it. She feels like she's noticed these small changes more so over the last year.   Review of Systems     Objective:   Physical Exam  Constitutional: She is oriented to person, place, and time. She appears well-developed and well-nourished.  HENT:  Head: Normocephalic and atraumatic.  Eyes: Conjunctivae and EOM are normal.  Cardiovascular: Normal rate.   Pulmonary/Chest: Effort normal.  Neurological: She is alert and oriented to person, place, and time.  Skin: Skin is dry. No pallor.  Psychiatric: She has a normal mood and affect. Her behavior is normal.          Assessment & Plan:  Diabetes - Increase your Lantus to 24 units BID.  She has been using her [place] . Asked her to call me in about a week if her blood sugars are getting back under control. Express the importance of keeping sugars under control to prevent/slow progression of dementia. As diabetes is a risk factor.   mild cognitive  impairment- many mental status exam score 20 out of 30. This is a passing score based on age and education level which is 74. She was unable to copy the overlapping pentagrams and missed 1 point for orientation. She also did a clock drawing. Overall is fairly normal except the 10 was  Sitting a little too low and in the position of the 9. All the numbers were present and were in order. And she did set the clock time to 10:30 which was appropriate.   we discussed the importance of controlling blood pressure and diabetes to help reduce risk of progression of vascular dementia. She has a prior history of TIAs it would not be surprising if she had some chronic ischemic changes of the brain. I do not see anything on exam or in her history that is suspicious for brain tumor etc. I would like to do some additional blood work just to rule out B12 deficiency thyroid disorder etc. We'l also check an RPR. I suspect that these will be fairly normal. We also discussed doing some activities at home the challenge the brain such as worried and math puzzles. Also setting reminders for herself. Also we could consider starting a medication for early dementia. We discussed the pros and cons. Encouraged the family to think about it. I  do recommend repeating the Mini-Mental status exam in 6 months.   right foot pain- per the medical record they diagnosed her with plantar fasciitis but she says she was told that her arch collapse. Nonetheless she does have a follow-up with podiatry in a few weeks.

## 2015-05-15 ENCOUNTER — Telehealth: Payer: Self-pay | Admitting: Cardiovascular Disease

## 2015-05-15 ENCOUNTER — Ambulatory Visit (INDEPENDENT_AMBULATORY_CARE_PROVIDER_SITE_OTHER): Payer: Medicare Other | Admitting: Pharmacist Clinician (PhC)/ Clinical Pharmacy Specialist

## 2015-05-15 LAB — CBC
HCT: 37.9 % (ref 36.0–46.0)
Hemoglobin: 12.3 g/dL (ref 12.0–15.0)
MCH: 31.9 pg (ref 26.0–34.0)
MCHC: 32.5 g/dL (ref 30.0–36.0)
MCV: 98.2 fL (ref 78.0–100.0)
MPV: 9.5 fL (ref 8.6–12.4)
Platelets: 259 10*3/uL (ref 150–400)
RBC: 3.86 MIL/uL — AB (ref 3.87–5.11)
RDW: 14 % (ref 11.5–15.5)
WBC: 7.9 10*3/uL (ref 4.0–10.5)

## 2015-05-15 LAB — RPR

## 2015-05-15 LAB — FERRITIN: Ferritin: 57 ng/mL (ref 10–291)

## 2015-05-15 LAB — C-REACTIVE PROTEIN: CRP: <0.5 mg/dL (ref <0.60)

## 2015-05-15 LAB — PROTIME-INR
INR: 5.04 (ref <1.50)
PROTHROMBIN TIME: 46.7 s — AB (ref 11.6–15.2)

## 2015-05-15 LAB — VITAMIN B12: Vitamin B-12: 1059 pg/mL — ABNORMAL HIGH (ref 211–911)

## 2015-05-15 LAB — MAGNESIUM: Magnesium: 1.8 mg/dL (ref 1.5–2.5)

## 2015-05-15 LAB — TSH: TSH: 0.858 u[IU]/mL (ref 0.350–4.500)

## 2015-05-15 LAB — FOLATE: Folate: >20 ng/mL

## 2015-05-15 NOTE — Telephone Encounter (Signed)
[facility] reporting crit high INR  INR 5.04 PT 46.7   Repeated and verified.

## 2015-05-21 DIAGNOSIS — M76821 Posterior tibial tendinitis, right leg: Secondary | ICD-10-CM | POA: Diagnosis not present

## 2015-05-21 DIAGNOSIS — M7742 Metatarsalgia, left foot: Secondary | ICD-10-CM | POA: Diagnosis not present

## 2015-05-21 DIAGNOSIS — M2140 Flat foot [pes planus] (acquired), unspecified foot: Secondary | ICD-10-CM | POA: Diagnosis not present

## 2015-05-21 DIAGNOSIS — M7741 Metatarsalgia, right foot: Secondary | ICD-10-CM | POA: Diagnosis not present

## 2015-05-23 DIAGNOSIS — L309 Dermatitis, unspecified: Secondary | ICD-10-CM | POA: Diagnosis not present

## 2015-05-23 DIAGNOSIS — H00019 Hordeolum externum unspecified eye, unspecified eyelid: Secondary | ICD-10-CM | POA: Diagnosis not present

## 2015-06-09 ENCOUNTER — Other Ambulatory Visit: Payer: Self-pay | Admitting: Cardiovascular Disease

## 2015-06-09 DIAGNOSIS — I4891 Unspecified atrial fibrillation: Secondary | ICD-10-CM | POA: Diagnosis not present

## 2015-06-09 DIAGNOSIS — Z7901 Long term (current) use of anticoagulants: Secondary | ICD-10-CM | POA: Diagnosis not present

## 2015-06-10 ENCOUNTER — Ambulatory Visit (INDEPENDENT_AMBULATORY_CARE_PROVIDER_SITE_OTHER): Payer: Medicare Other | Admitting: Pharmacist Clinician (PhC)/ Clinical Pharmacy Specialist

## 2015-06-10 LAB — PROTIME-INR
INR: 3.03 — AB (ref <1.50)
Prothrombin Time: 31.4 seconds — ABNORMAL HIGH (ref 11.6–15.2)

## 2015-06-17 ENCOUNTER — Ambulatory Visit (INDEPENDENT_AMBULATORY_CARE_PROVIDER_SITE_OTHER): Payer: Medicare Other | Admitting: Obstetrics & Gynecology

## 2015-06-17 ENCOUNTER — Ambulatory Visit (INDEPENDENT_AMBULATORY_CARE_PROVIDER_SITE_OTHER): Payer: Medicare Other | Admitting: Family Medicine

## 2015-06-17 ENCOUNTER — Encounter: Payer: Self-pay | Admitting: Family Medicine

## 2015-06-17 ENCOUNTER — Encounter: Payer: Self-pay | Admitting: Obstetrics & Gynecology

## 2015-06-17 VITALS — BP 111/65 | HR 95 | Temp 98.1°F | Wt 191.0 lb

## 2015-06-17 VITALS — BP 125/66 | HR 94 | Resp 16 | Ht 67.0 in | Wt 191.0 lb

## 2015-06-17 DIAGNOSIS — B356 Tinea cruris: Secondary | ICD-10-CM

## 2015-06-17 DIAGNOSIS — N766 Ulceration of vulva: Secondary | ICD-10-CM

## 2015-06-17 MED ORDER — CLOTRIMAZOLE-BETAMETHASONE 1-0.05 % EX CREA: 1.0000 "application " | TOPICAL_CREAM | Freq: Two times a day (BID) | CUTANEOUS | Status: DC

## 2015-06-17 NOTE — Progress Notes (Signed)
   Subjective:    Patient ID: [name], female    DOB: [birth year], 81 y.o.   MRN: [id number]  HPI  This 81 yo lady is here as a referral from Dr. Madilyn Fireman today for evaluation of a left labial ulcer. She remembers that it started hurting about 2 weeks ago but she is really not sure when it first appeared. She is a diabetic with poorly controlled sugars. She has some degree of urinary and rectal incontinence.  Review of Systems     Objective:   Physical Exam Her entire vulva, groin and vaginal opening is very red c/w constant exposure to urine. She has a 1.5 cm ulcer on the outer portion of her left labia majora.  I prepped the area with betadine and used 1% lidocaine for anesthesia. I then removed a 3 mm area near the lower edge of the ulcer. Silver nitrate was used for hemostasis. She tolerated the procedure well.       Assessment & Plan:  Vulvar ulcer- await pathology Continue doxy BID for 2 weeks.

## 2015-06-17 NOTE — Progress Notes (Signed)
   Subjective:    Patient ID: [name], female    DOB: 07/24/1928, 81 y.o.   MRN: [id number]  HPI patient noticed a sore on her left labia about 2 and half weeks ago. It has been getting more tender and swollen. It is been draining blood for the last Sunday.   Using tylenol for pain.   Review of Systems     Objective:   Physical Exam  Constitutional: She appears well-developed and well-nourished.  Skin: Skin is warm and dry.  On the left labia she has a 1 x 2 cm ulcerated lesion. There is some firm induration underneath and around the lesion. With some purplish discoloration anteriorly. I am able to express a little bit of blood from the lesion. She also has a erythematous well-demarcated confluent rash along the right groin crease.  Psychiatric: She has a normal mood and affect. Her behavior is normal.          Assessment & Plan:  Ulcer on left labia-based on the feel of the lesion there may actually be an abscess behind the ulcer but it's not clear. I am able to express some blood from the lesion but no pus. Today because of the ulceration this is good be very unusual for typical abscess appearance. I really think she may need biopsy. She did start doxycycline 100 mg twice a day, yesterday. Encouraged her to continue with that for now which will cover for MRSA. We will call and get her in with GYN for possible biopsy this afternoon.  Tinea cruris -  Will treat with Lotrisone.

## 2015-06-25 ENCOUNTER — Ambulatory Visit: Payer: Medicare Other | Admitting: Family Medicine

## 2015-07-09 ENCOUNTER — Ambulatory Visit (INDEPENDENT_AMBULATORY_CARE_PROVIDER_SITE_OTHER): Payer: Medicare Other

## 2015-07-09 ENCOUNTER — Other Ambulatory Visit: Payer: Self-pay | Admitting: Family Medicine

## 2015-07-09 ENCOUNTER — Encounter: Payer: Self-pay | Admitting: Family Medicine

## 2015-07-09 ENCOUNTER — Ambulatory Visit (INDEPENDENT_AMBULATORY_CARE_PROVIDER_SITE_OTHER): Payer: Medicare Other | Admitting: Family Medicine

## 2015-07-09 VITALS — BP 128/72 | HR 84 | Wt 188.0 lb

## 2015-07-09 DIAGNOSIS — X58XXXA Exposure to other specified factors, initial encounter: Secondary | ICD-10-CM

## 2015-07-09 DIAGNOSIS — E1149 Type 2 diabetes mellitus with other diabetic neurological complication: Secondary | ICD-10-CM

## 2015-07-09 DIAGNOSIS — M79675 Pain in left toe(s): Secondary | ICD-10-CM | POA: Diagnosis not present

## 2015-07-09 DIAGNOSIS — M545 Low back pain, unspecified: Secondary | ICD-10-CM

## 2015-07-09 DIAGNOSIS — S92425A Nondisplaced fracture of distal phalanx of left great toe, initial encounter for closed fracture: Secondary | ICD-10-CM | POA: Diagnosis not present

## 2015-07-09 DIAGNOSIS — M4317 Spondylolisthesis, lumbosacral region: Secondary | ICD-10-CM | POA: Diagnosis not present

## 2015-07-09 DIAGNOSIS — M5136 Other intervertebral disc degeneration, lumbar region: Secondary | ICD-10-CM

## 2015-07-09 DIAGNOSIS — M1288 Other specific arthropathies, not elsewhere classified, other specified site: Secondary | ICD-10-CM

## 2015-07-09 DIAGNOSIS — I2510 Atherosclerotic heart disease of native coronary artery without angina pectoris: Secondary | ICD-10-CM

## 2015-07-09 DIAGNOSIS — M7741 Metatarsalgia, right foot: Secondary | ICD-10-CM | POA: Diagnosis not present

## 2015-07-09 DIAGNOSIS — I10 Essential (primary) hypertension: Secondary | ICD-10-CM

## 2015-07-09 DIAGNOSIS — E1142 Type 2 diabetes mellitus with diabetic polyneuropathy: Secondary | ICD-10-CM | POA: Diagnosis not present

## 2015-07-09 DIAGNOSIS — S99922A Unspecified injury of left foot, initial encounter: Secondary | ICD-10-CM | POA: Diagnosis not present

## 2015-07-09 DIAGNOSIS — M85872 Other specified disorders of bone density and structure, left ankle and foot: Secondary | ICD-10-CM

## 2015-07-09 DIAGNOSIS — S91102A Unspecified open wound of left great toe without damage to nail, initial encounter: Secondary | ICD-10-CM | POA: Diagnosis not present

## 2015-07-09 DIAGNOSIS — M7742 Metatarsalgia, left foot: Secondary | ICD-10-CM | POA: Diagnosis not present

## 2015-07-09 DIAGNOSIS — S91119A Laceration without foreign body of unspecified toe without damage to nail, initial encounter: Secondary | ICD-10-CM

## 2015-07-09 LAB — POCT GLYCOSYLATED HEMOGLOBIN (HGB A1C): Hemoglobin A1C: 9.3

## 2015-07-09 MED ORDER — LANTUS SOLOSTAR 100 UNIT/ML ~~LOC~~ SOPN: 28.0000 [IU] | PEN_INJECTOR | Freq: Two times a day (BID) | SUBCUTANEOUS | Status: DC

## 2015-07-09 MED ORDER — HYDROCODONE-ACETAMINOPHEN 5-325 MG PO TABS: 1.0000 | ORAL_TABLET | Freq: Four times a day (QID) | ORAL | Status: DC | PRN

## 2015-07-09 MED ORDER — OXYCODONE-ACETAMINOPHEN 5-325 MG PO TABS: 1.0000 | ORAL_TABLET | Freq: Four times a day (QID) | ORAL | Status: DC | PRN

## 2015-07-09 MED ORDER — SITAGLIPTIN PHOSPHATE 100 MG PO TABS: 100.0000 mg | ORAL_TABLET | Freq: Every day | ORAL | Status: DC

## 2015-07-09 NOTE — Progress Notes (Signed)
Subjective:    Patient ID: [name], female    DOB: [birth year], 81 y.o.   MRN: [id number]  HPI Diabetes - no hypoglycemic events. No wounds or sores that are not healing well. No increased thirst or urination. Checking glucose at home. Taking medications as prescribed without any side effects. Currently on Lantus and humalog. Though doesn't use her humalog consistantly.    Hypertension- Pt denies chest pain, SOB, dizziness, or heart palpitations.  Taking meds as directed w/o problems.  Denies medication side effects.  Home BPs have been great.    pt skinned her R great toe on 7/4 she was given keflex for this. she would like refill of pain meds. It has been very painful to walk on it and hard to flex her toe.   Left low back pain has been getting worse. Better at rest and sitting.  Says can be severe at time. Xray done 2 years ago showed some DDD and some anterolisthesis.    Review of Systems     Objective:   Physical Exam  Constitutional: She is oriented to person, place, and time. She appears well-developed and well-nourished.  HENT:  Head: Normocephalic and atraumatic.  Right Ear: External ear normal.  Left Ear: External ear normal.  Nose: Nose normal.  Eyes: Conjunctivae are normal. Right eye exhibits no discharge. Left eye exhibits no discharge.  Neck: Neck supple. No thyromegaly present.  Cardiovascular: Normal rate, regular rhythm and normal heart sounds.   Pulmonary/Chest: Effort normal and breath sounds normal.  Musculoskeletal: She exhibits no edema.  Nontender of the lumbar spine or SI joints.  Lymphadenopathy:    She has no cervical adenopathy.  Neurological: She is alert and oriented to person, place, and time.  Skin: Skin is warm and dry.  Left great toe has a large laceration across the top. It actually appears to be healing well. Some of the superficial skin has pill back where it was previously blistered. There is a lot of dried blood around the edge of the  toenail just a little bit of induration along the nail border. There is some bruising on the top of the foot. She is unable to flex the toe and when I try to flex it for her she has a lot of pain. She's very tender over the distal toe joint.  Psychiatric: She has a normal mood and affect. Her behavior is normal. Judgment and thought content normal.          H & P   DM- Uncontrolle.d A1C of 9.3 today. Increase Lantus to 28 units BID.  Will start Januvia.  Follow-up in 3 months. If the Januvia is too expensive and give [place] a call back.  HTN - repeat blood pressure was well controlled today. Continue current regimen.  Toe laceration - I think the laceration is actually healing fairly well. Can go ahead and start applying Vaseline for limited gauze and Coban and on top. Avoid any type of astringents alcohol or peroxide products. Continue Keflex for now.  Left great toe pain - because she's unable to flex it I would like to get an x-ray and is painful to walk on it. If it could just be from the laceration itself by 1 and make sure there is no fracture. Will call with results once available.  Low back pain-she has chronic low back pain but the left-sided been bothering her more recently. X-ray back in 2014 showed anterolisthesis of L5 upon S1 approximate 8  mm. There was also about a 5 mm anterolisthesis of L4 on L5. She also had multiple levels of degenerative disc disease and facet arthropathy. She had an injection which was not helpful about 2 years ago. She never did physical therapy. We'll repeat x-ray today and call with results. She would probably most benefit from physical therapy. I don't know that I would recommend Surgical intervention at this point. Did refill her hydrocodone today for has needed use.

## 2015-07-10 ENCOUNTER — Encounter: Payer: Self-pay | Admitting: Family Medicine

## 2015-07-10 ENCOUNTER — Other Ambulatory Visit: Payer: Self-pay | Admitting: Cardiovascular Disease

## 2015-07-10 DIAGNOSIS — I4891 Unspecified atrial fibrillation: Secondary | ICD-10-CM | POA: Diagnosis not present

## 2015-07-10 DIAGNOSIS — Z7901 Long term (current) use of anticoagulants: Secondary | ICD-10-CM | POA: Diagnosis not present

## 2015-07-10 DIAGNOSIS — I700 Atherosclerosis of aorta: Secondary | ICD-10-CM | POA: Insufficient documentation

## 2015-07-10 LAB — COMPLETE METABOLIC PANEL WITH GFR
ALBUMIN: 4 g/dL (ref 3.5–5.2)
ALT: 20 U/L (ref 0–35)
AST: 29 U/L (ref 0–37)
Alkaline Phosphatase: 69 U/L (ref 39–117)
BILIRUBIN TOTAL: 0.6 mg/dL (ref 0.2–1.2)
BUN: 14 mg/dL (ref 6–23)
CHLORIDE: 106 meq/L (ref 96–112)
CO2: 28 meq/L (ref 19–32)
CREATININE: 1.29 mg/dL — AB (ref 0.50–1.10)
Calcium: 9.7 mg/dL (ref 8.4–10.5)
GFR, Est African American: 44 mL/min — ABNORMAL LOW
GFR, Est Non African American: 38 mL/min — ABNORMAL LOW
GLUCOSE: 64 mg/dL — AB (ref 70–99)
POTASSIUM: 5 meq/L (ref 3.5–5.3)
SODIUM: 146 meq/L — AB (ref 135–145)
TOTAL PROTEIN: 6.4 g/dL (ref 6.0–8.3)

## 2015-07-10 LAB — LIPID PANEL
CHOLESTEROL: 128 mg/dL (ref 0–200)
HDL: 50 mg/dL (ref >=46)
LDL Cholesterol: 53 mg/dL (ref 0–99)
Total CHOL/HDL Ratio: 2.6 Ratio
Triglycerides: 125 mg/dL (ref <150)
VLDL: 25 mg/dL (ref 0–40)

## 2015-07-11 ENCOUNTER — Other Ambulatory Visit: Payer: Self-pay | Admitting: Family Medicine

## 2015-07-11 ENCOUNTER — Ambulatory Visit (INDEPENDENT_AMBULATORY_CARE_PROVIDER_SITE_OTHER): Payer: Medicare Other | Admitting: Pharmacist Clinician (PhC)/ Clinical Pharmacy Specialist

## 2015-07-11 LAB — PROTIME-INR
INR: 2.24 — ABNORMAL HIGH (ref <1.50)
PROTHROMBIN TIME: 24.8 s — AB (ref 11.6–15.2)

## 2015-07-15 ENCOUNTER — Ambulatory Visit (INDEPENDENT_AMBULATORY_CARE_PROVIDER_SITE_OTHER): Payer: Medicare Other | Admitting: Family Medicine

## 2015-07-15 ENCOUNTER — Encounter: Payer: Self-pay | Admitting: Family Medicine

## 2015-07-15 ENCOUNTER — Ambulatory Visit (INDEPENDENT_AMBULATORY_CARE_PROVIDER_SITE_OTHER): Payer: Medicare Other

## 2015-07-15 VITALS — BP 115/62 | HR 93 | Wt 191.0 lb

## 2015-07-15 DIAGNOSIS — S92402D Displaced unspecified fracture of left great toe, subsequent encounter for fracture with routine healing: Secondary | ICD-10-CM

## 2015-07-15 DIAGNOSIS — M546 Pain in thoracic spine: Secondary | ICD-10-CM | POA: Diagnosis not present

## 2015-07-15 DIAGNOSIS — M4184 Other forms of scoliosis, thoracic region: Secondary | ICD-10-CM

## 2015-07-15 DIAGNOSIS — L03032 Cellulitis of left toe: Secondary | ICD-10-CM | POA: Diagnosis not present

## 2015-07-15 DIAGNOSIS — E1149 Type 2 diabetes mellitus with other diabetic neurological complication: Secondary | ICD-10-CM

## 2015-07-15 DIAGNOSIS — M47814 Spondylosis without myelopathy or radiculopathy, thoracic region: Secondary | ICD-10-CM | POA: Diagnosis not present

## 2015-07-15 MED ORDER — CEPHALEXIN 500 MG PO CAPS: 500.0000 mg | ORAL_CAPSULE | Freq: Three times a day (TID) | ORAL | Status: DC

## 2015-07-15 NOTE — Progress Notes (Signed)
   Subjective:    Patient ID: [name], female    DOB: [birth year], 81 y.o.   MRN: [id number]  HPI Early cellulitis of left great toe - Says did lance the blister. Says has had some clear drainage.  She still has 12 more days left on the Keflex that was originally prescribed. We also did an x-ray when I saw her about a week ago and she did fracture the great toe. She was supposed to come back in here to have a bit place that she was actually seeing her podiatrist anyway. The podiatrist placed her in a postop shoe and is seeing her back in about a week and a half to follow-up on the fracture.  Diabetes - Says starting the Januvia can really help Says her sugar have been better since starting the medication. She started noticing a difference and says she actually feels better..   Still having mid back pain. No pain at rest.  Sayw after moving around for 10 minutes it hurts so bad she has to sit down.  Pain is across the mid back no the bra strap area but worse on the right. She says even just getting up and going to the bathroom sometimes she gets sharp pain. She was actually seen in [place] for this about a year ago and actually had an epidural injection back in August. She said it did help but only lasted about 2 weeks and never followed back up.  Review of Systems     Objective:   Physical Exam  Constitutional: She appears well-developed and well-nourished.  HENT:  Head: Normocephalic and atraumatic.  Musculoskeletal:       Feet:  Neurological: She is alert.  Skin: Skin is warm and dry.  Psychiatric: She has a normal mood and affect. Her behavior is normal.          Assessment & Plan:  Early cellulitis of left great toe - will add 4 more days of keflex.  New prescription sent to the pharmacy. Continue to keep an eye on the wound. It seems to be healing well she just has a lot of purple bruising over the toe was still some significant swelling. If she notices any  pus or drainage and please let us know.   Great toe fracture-she's currently wearing a postop shoe. She will continue to follow with her podiatrist for fracture care at this point in time.  Diabetes-she started noticing some improvement in her blood sugars at home with the [place]. She's had some difficulty with consistency with her insulin so we had tried adding an oral hypoglycemic tab. Keep regular appointment for diabetes and hopefully her next A1c will look better.  Thoracic back pain-consider getting repeat x-rays her last imaging was a year ago. She could also consider going back to [facility] to follow-up since she never did and let them know that she only got relief from the epidural injection for about 2 weeks. They might have some other treatment options available.

## 2015-07-23 ENCOUNTER — Telehealth: Payer: Self-pay | Admitting: *Deleted

## 2015-07-23 NOTE — Telephone Encounter (Signed)
-----   Message from Emily Filbert, MD sent at 07/22/2015  4:06 PM EDT ----- She merely has inflamation and I would suggest that she use betamethasone ointment BID. Thanks

## 2015-07-23 NOTE — Telephone Encounter (Signed)
Pt;s daughter made aware of results and she states that the area is so much better after using the Betamethasone.

## 2015-07-25 ENCOUNTER — Other Ambulatory Visit: Payer: Self-pay | Admitting: Family Medicine

## 2015-07-30 DIAGNOSIS — M5134 Other intervertebral disc degeneration, thoracic region: Secondary | ICD-10-CM | POA: Diagnosis not present

## 2015-07-30 DIAGNOSIS — M4316 Spondylolisthesis, lumbar region: Secondary | ICD-10-CM | POA: Diagnosis not present

## 2015-07-30 DIAGNOSIS — M546 Pain in thoracic spine: Secondary | ICD-10-CM | POA: Diagnosis not present

## 2015-07-30 DIAGNOSIS — M5136 Other intervertebral disc degeneration, lumbar region: Secondary | ICD-10-CM | POA: Diagnosis not present

## 2015-07-31 ENCOUNTER — Telehealth: Payer: Self-pay | Admitting: Cardiology

## 2015-07-31 ENCOUNTER — Ambulatory Visit: Payer: Medicare Other | Admitting: *Deleted

## 2015-07-31 DIAGNOSIS — M76821 Posterior tibial tendinitis, right leg: Secondary | ICD-10-CM | POA: Diagnosis not present

## 2015-07-31 DIAGNOSIS — Z872 Personal history of diseases of the skin and subcutaneous tissue: Secondary | ICD-10-CM | POA: Diagnosis not present

## 2015-07-31 DIAGNOSIS — S92402A Displaced unspecified fracture of left great toe, initial encounter for closed fracture: Secondary | ICD-10-CM | POA: Diagnosis not present

## 2015-07-31 DIAGNOSIS — M2041 Other hammer toe(s) (acquired), right foot: Secondary | ICD-10-CM | POA: Diagnosis not present

## 2015-07-31 DIAGNOSIS — E1142 Type 2 diabetes mellitus with diabetic polyneuropathy: Secondary | ICD-10-CM | POA: Diagnosis not present

## 2015-07-31 NOTE — Telephone Encounter (Signed)
Attempted to confirm remote transmission with pt. No answer and was unable to leave a message.   

## 2015-08-01 ENCOUNTER — Ambulatory Visit (INDEPENDENT_AMBULATORY_CARE_PROVIDER_SITE_OTHER): Payer: Medicare Other | Admitting: *Deleted

## 2015-08-01 ENCOUNTER — Encounter: Payer: Self-pay | Admitting: Cardiology

## 2015-08-01 DIAGNOSIS — I442 Atrioventricular block, complete: Secondary | ICD-10-CM

## 2015-08-02 ENCOUNTER — Encounter: Payer: Self-pay | Admitting: Cardiovascular Disease

## 2015-08-02 DIAGNOSIS — I442 Atrioventricular block, complete: Secondary | ICD-10-CM | POA: Diagnosis not present

## 2015-08-04 DIAGNOSIS — Z0100 Encounter for examination of eyes and vision without abnormal findings: Secondary | ICD-10-CM | POA: Diagnosis not present

## 2015-08-04 LAB — HM DIABETES EYE EXAM

## 2015-08-04 NOTE — Progress Notes (Signed)
Remote pacemaker transmission.   

## 2015-08-06 LAB — CUP PACEART REMOTE DEVICE CHECK
Battery Impedance: 347 Ohm
Battery Voltage: 2.79 V
Brady Statistic AP VP Percent: 58 %
Brady Statistic AP VS Percent: 0 %
Brady Statistic AS VP Percent: 42 %
Brady Statistic AS VS Percent: 0 %
Date Time Interrogation Session: 20160806180906
Lead Channel Impedance Value: 546 Ohm
Lead Channel Pacing Threshold Amplitude: 0.5 V
Lead Channel Pacing Threshold Pulse Width: 0.4 ms
Lead Channel Sensing Intrinsic Amplitude: 1.4 mV
Lead Channel Setting Pacing Amplitude: 2 V
Lead Channel Setting Pacing Pulse Width: 0.4 ms
MDC IDC MSMT BATTERY REMAINING LONGEVITY: 106 mo
MDC IDC MSMT LEADCHNL RV IMPEDANCE VALUE: 636 Ohm
MDC IDC MSMT LEADCHNL RV PACING THRESHOLD AMPLITUDE: 0.625 V
MDC IDC MSMT LEADCHNL RV PACING THRESHOLD PULSEWIDTH: 0.4 ms
MDC IDC SET LEADCHNL RA PACING AMPLITUDE: 1.5 V
MDC IDC SET LEADCHNL RV SENSING SENSITIVITY: 2.8 mV

## 2015-08-07 ENCOUNTER — Telehealth: Payer: Self-pay | Admitting: Cardiovascular Disease

## 2015-08-07 ENCOUNTER — Encounter: Payer: Self-pay | Admitting: *Deleted

## 2015-08-07 NOTE — Telephone Encounter (Signed)
Attempted call--mailbox full--unable to LM/SSS

## 2015-08-07 NOTE — Telephone Encounter (Signed)
Letter composed and faxed to [facility] - called her and LM to confirm.

## 2015-08-07 NOTE — Telephone Encounter (Signed)
Dr. Nelva Bush' office requesting clearance for patient to be off coumadin 5 days prior to injections - wants to have pt stop coumadin today.  Routing to [place] for advice.   ------- Minette BrineEstill Bamberg - [phone] ex [phone] Fax: [phone]

## 2015-08-07 NOTE — Telephone Encounter (Signed)
Per protocol she is ok to hold warfarin x 5 days.  Restart evening of procedure at normal dose.  Please let Dr. Nelva Bush and patient know.

## 2015-08-07 NOTE — Telephone Encounter (Signed)
Pt received a letter yesterday,saying she need to transmit. Pt transmitted on Saturday morning,did it not go through?

## 2015-08-13 ENCOUNTER — Ambulatory Visit (INDEPENDENT_AMBULATORY_CARE_PROVIDER_SITE_OTHER): Payer: Medicare Other | Admitting: *Deleted

## 2015-08-13 DIAGNOSIS — M5134 Other intervertebral disc degeneration, thoracic region: Secondary | ICD-10-CM | POA: Diagnosis not present

## 2015-08-13 DIAGNOSIS — Z7901 Long term (current) use of anticoagulants: Secondary | ICD-10-CM

## 2015-08-13 DIAGNOSIS — I4891 Unspecified atrial fibrillation: Secondary | ICD-10-CM

## 2015-08-13 LAB — PROTIME-INR: INR: 1 (ref 0.9–1.1)

## 2015-08-20 ENCOUNTER — Encounter: Payer: Self-pay | Admitting: Cardiology

## 2015-08-21 ENCOUNTER — Other Ambulatory Visit: Payer: Self-pay | Admitting: Family Medicine

## 2015-08-25 ENCOUNTER — Ambulatory Visit: Payer: Medicare Other | Admitting: Cardiology

## 2015-09-09 ENCOUNTER — Other Ambulatory Visit: Payer: Self-pay | Admitting: Family Medicine

## 2015-09-09 DIAGNOSIS — M25571 Pain in right ankle and joints of right foot: Secondary | ICD-10-CM | POA: Diagnosis not present

## 2015-09-09 DIAGNOSIS — I4891 Unspecified atrial fibrillation: Secondary | ICD-10-CM | POA: Diagnosis not present

## 2015-09-09 DIAGNOSIS — S7290XA Unspecified fracture of unspecified femur, initial encounter for closed fracture: Secondary | ICD-10-CM | POA: Diagnosis not present

## 2015-09-09 DIAGNOSIS — I361 Nonrheumatic tricuspid (valve) insufficiency: Secondary | ICD-10-CM | POA: Diagnosis not present

## 2015-09-09 DIAGNOSIS — S72301A Unspecified fracture of shaft of right femur, initial encounter for closed fracture: Secondary | ICD-10-CM | POA: Diagnosis not present

## 2015-09-09 DIAGNOSIS — E119 Type 2 diabetes mellitus without complications: Secondary | ICD-10-CM | POA: Diagnosis not present

## 2015-09-09 DIAGNOSIS — M25551 Pain in right hip: Secondary | ICD-10-CM | POA: Diagnosis not present

## 2015-09-09 DIAGNOSIS — Z01818 Encounter for other preprocedural examination: Secondary | ICD-10-CM | POA: Diagnosis not present

## 2015-09-09 DIAGNOSIS — I482 Chronic atrial fibrillation: Secondary | ICD-10-CM | POA: Diagnosis not present

## 2015-09-09 DIAGNOSIS — E785 Hyperlipidemia, unspecified: Secondary | ICD-10-CM | POA: Diagnosis not present

## 2015-09-09 DIAGNOSIS — D62 Acute posthemorrhagic anemia: Secondary | ICD-10-CM | POA: Diagnosis not present

## 2015-09-09 DIAGNOSIS — S72491A Other fracture of lower end of right femur, initial encounter for closed fracture: Secondary | ICD-10-CM | POA: Diagnosis not present

## 2015-09-09 DIAGNOSIS — S72401A Unspecified fracture of lower end of right femur, initial encounter for closed fracture: Secondary | ICD-10-CM | POA: Diagnosis not present

## 2015-09-09 DIAGNOSIS — Z950 Presence of cardiac pacemaker: Secondary | ICD-10-CM | POA: Diagnosis not present

## 2015-09-09 DIAGNOSIS — W1809XA Striking against other object with subsequent fall, initial encounter: Secondary | ICD-10-CM | POA: Diagnosis not present

## 2015-09-09 DIAGNOSIS — M25579 Pain in unspecified ankle and joints of unspecified foot: Secondary | ICD-10-CM | POA: Diagnosis not present

## 2015-09-09 DIAGNOSIS — M6281 Muscle weakness (generalized): Secondary | ICD-10-CM | POA: Diagnosis not present

## 2015-09-09 DIAGNOSIS — S8290XA Unspecified fracture of unspecified lower leg, initial encounter for closed fracture: Secondary | ICD-10-CM | POA: Diagnosis not present

## 2015-09-09 DIAGNOSIS — Z452 Encounter for adjustment and management of vascular access device: Secondary | ICD-10-CM | POA: Diagnosis not present

## 2015-09-09 DIAGNOSIS — S7291XA Unspecified fracture of right femur, initial encounter for closed fracture: Secondary | ICD-10-CM | POA: Diagnosis not present

## 2015-09-09 DIAGNOSIS — K219 Gastro-esophageal reflux disease without esophagitis: Secondary | ICD-10-CM | POA: Diagnosis not present

## 2015-09-09 DIAGNOSIS — S72351D Displaced comminuted fracture of shaft of right femur, subsequent encounter for closed fracture with routine healing: Secondary | ICD-10-CM | POA: Diagnosis not present

## 2015-09-09 DIAGNOSIS — I129 Hypertensive chronic kidney disease with stage 1 through stage 4 chronic kidney disease, or unspecified chronic kidney disease: Secondary | ICD-10-CM | POA: Diagnosis not present

## 2015-09-09 DIAGNOSIS — I480 Paroxysmal atrial fibrillation: Secondary | ICD-10-CM | POA: Diagnosis not present

## 2015-09-09 DIAGNOSIS — Z9181 History of falling: Secondary | ICD-10-CM | POA: Diagnosis not present

## 2015-09-09 DIAGNOSIS — I10 Essential (primary) hypertension: Secondary | ICD-10-CM | POA: Diagnosis not present

## 2015-09-09 DIAGNOSIS — S79911A Unspecified injury of right hip, initial encounter: Secondary | ICD-10-CM | POA: Diagnosis not present

## 2015-09-09 DIAGNOSIS — S99911A Unspecified injury of right ankle, initial encounter: Secondary | ICD-10-CM | POA: Diagnosis not present

## 2015-09-09 DIAGNOSIS — T1490 Injury, unspecified: Secondary | ICD-10-CM | POA: Diagnosis not present

## 2015-09-09 DIAGNOSIS — N183 Chronic kidney disease, stage 3 (moderate): Secondary | ICD-10-CM | POA: Diagnosis not present

## 2015-09-09 DIAGNOSIS — G8929 Other chronic pain: Secondary | ICD-10-CM | POA: Diagnosis not present

## 2015-09-09 DIAGNOSIS — E039 Hypothyroidism, unspecified: Secondary | ICD-10-CM | POA: Diagnosis not present

## 2015-09-09 DIAGNOSIS — Z794 Long term (current) use of insulin: Secondary | ICD-10-CM | POA: Diagnosis not present

## 2015-09-09 DIAGNOSIS — M25529 Pain in unspecified elbow: Secondary | ICD-10-CM | POA: Diagnosis not present

## 2015-09-09 DIAGNOSIS — E118 Type 2 diabetes mellitus with unspecified complications: Secondary | ICD-10-CM | POA: Diagnosis not present

## 2015-09-09 DIAGNOSIS — Z01810 Encounter for preprocedural cardiovascular examination: Secondary | ICD-10-CM | POA: Diagnosis not present

## 2015-09-09 DIAGNOSIS — Z7901 Long term (current) use of anticoagulants: Secondary | ICD-10-CM | POA: Diagnosis not present

## 2015-09-09 DIAGNOSIS — S79101A Unspecified physeal fracture of lower end of right femur, initial encounter for closed fracture: Secondary | ICD-10-CM | POA: Diagnosis not present

## 2015-09-12 ENCOUNTER — Telehealth: Payer: Self-pay | Admitting: *Deleted

## 2015-09-12 NOTE — Telephone Encounter (Signed)
Called pt's daughter back and informed her that she could try [facility] which is in [place]. Advised her that if they need another facility to call back. She voiced understanding and agreed.[name] [place]

## 2015-09-12 NOTE — Telephone Encounter (Signed)
[name] lvm asking about skilled nursing facility that her mother can be transported to because [place] and cracked her femur in 3 places. [name], [name]

## 2015-09-15 DIAGNOSIS — S72491D Other fracture of lower end of right femur, subsequent encounter for closed fracture with routine healing: Secondary | ICD-10-CM | POA: Diagnosis not present

## 2015-09-15 DIAGNOSIS — Z794 Long term (current) use of insulin: Secondary | ICD-10-CM | POA: Diagnosis not present

## 2015-09-15 DIAGNOSIS — E119 Type 2 diabetes mellitus without complications: Secondary | ICD-10-CM | POA: Diagnosis not present

## 2015-09-15 DIAGNOSIS — Z9181 History of falling: Secondary | ICD-10-CM | POA: Diagnosis not present

## 2015-09-15 DIAGNOSIS — D62 Acute posthemorrhagic anemia: Secondary | ICD-10-CM | POA: Diagnosis not present

## 2015-09-15 DIAGNOSIS — I480 Paroxysmal atrial fibrillation: Secondary | ICD-10-CM | POA: Diagnosis not present

## 2015-09-15 DIAGNOSIS — I4891 Unspecified atrial fibrillation: Secondary | ICD-10-CM | POA: Diagnosis not present

## 2015-09-15 DIAGNOSIS — E039 Hypothyroidism, unspecified: Secondary | ICD-10-CM | POA: Diagnosis not present

## 2015-09-15 DIAGNOSIS — I5032 Chronic diastolic (congestive) heart failure: Secondary | ICD-10-CM | POA: Diagnosis not present

## 2015-09-15 DIAGNOSIS — E785 Hyperlipidemia, unspecified: Secondary | ICD-10-CM | POA: Diagnosis not present

## 2015-09-15 DIAGNOSIS — S72351D Displaced comminuted fracture of shaft of right femur, subsequent encounter for closed fracture with routine healing: Secondary | ICD-10-CM | POA: Diagnosis not present

## 2015-09-15 DIAGNOSIS — I10 Essential (primary) hypertension: Secondary | ICD-10-CM | POA: Diagnosis not present

## 2015-09-15 DIAGNOSIS — I110 Hypertensive heart disease with heart failure: Secondary | ICD-10-CM | POA: Diagnosis not present

## 2015-09-15 DIAGNOSIS — Z950 Presence of cardiac pacemaker: Secondary | ICD-10-CM | POA: Diagnosis not present

## 2015-09-15 DIAGNOSIS — I482 Chronic atrial fibrillation: Secondary | ICD-10-CM | POA: Diagnosis not present

## 2015-09-15 DIAGNOSIS — K219 Gastro-esophageal reflux disease without esophagitis: Secondary | ICD-10-CM | POA: Diagnosis not present

## 2015-09-15 DIAGNOSIS — G8929 Other chronic pain: Secondary | ICD-10-CM | POA: Diagnosis not present

## 2015-09-15 DIAGNOSIS — S8290XA Unspecified fracture of unspecified lower leg, initial encounter for closed fracture: Secondary | ICD-10-CM | POA: Diagnosis not present

## 2015-09-15 DIAGNOSIS — E118 Type 2 diabetes mellitus with unspecified complications: Secondary | ICD-10-CM | POA: Diagnosis not present

## 2015-09-15 DIAGNOSIS — M6281 Muscle weakness (generalized): Secondary | ICD-10-CM | POA: Diagnosis not present

## 2015-09-15 DIAGNOSIS — M25579 Pain in unspecified ankle and joints of unspecified foot: Secondary | ICD-10-CM | POA: Diagnosis not present

## 2015-09-16 ENCOUNTER — Ambulatory Visit: Payer: Medicare Other | Admitting: Cardiology

## 2015-09-17 ENCOUNTER — Ambulatory Visit: Payer: Medicare Other | Admitting: Cardiology

## 2015-09-22 ENCOUNTER — Non-Acute Institutional Stay (SKILLED_NURSING_FACILITY): Payer: Medicare Other | Admitting: Internal Medicine

## 2015-09-22 DIAGNOSIS — D62 Acute posthemorrhagic anemia: Secondary | ICD-10-CM | POA: Diagnosis not present

## 2015-09-22 DIAGNOSIS — I480 Paroxysmal atrial fibrillation: Secondary | ICD-10-CM

## 2015-09-22 DIAGNOSIS — I5032 Chronic diastolic (congestive) heart failure: Secondary | ICD-10-CM

## 2015-09-22 DIAGNOSIS — S72491D Other fracture of lower end of right femur, subsequent encounter for closed fracture with routine healing: Secondary | ICD-10-CM | POA: Diagnosis not present

## 2015-09-22 DIAGNOSIS — I509 Heart failure, unspecified: Secondary | ICD-10-CM

## 2015-09-22 DIAGNOSIS — E1142 Type 2 diabetes mellitus with diabetic polyneuropathy: Secondary | ICD-10-CM

## 2015-09-22 DIAGNOSIS — K219 Gastro-esophageal reflux disease without esophagitis: Secondary | ICD-10-CM

## 2015-09-22 DIAGNOSIS — I110 Hypertensive heart disease with heart failure: Secondary | ICD-10-CM

## 2015-09-22 DIAGNOSIS — E785 Hyperlipidemia, unspecified: Secondary | ICD-10-CM

## 2015-09-22 DIAGNOSIS — E034 Atrophy of thyroid (acquired): Secondary | ICD-10-CM

## 2015-09-22 DIAGNOSIS — E038 Other specified hypothyroidism: Secondary | ICD-10-CM | POA: Diagnosis not present

## 2015-09-28 NOTE — Progress Notes (Signed)
MRN: [id number] Name: [name]  Sex: female Age: 81 y.o. DOB: [birth year]  [place] [facility]: [facility] Facility/Room:212 Level Of Care: SNF Provider: Inocencio Homes D Emergency Contacts: Extended Emergency Contact Information Primary Emergency Contact: [facility] Address: [address]          [place], [place] [address] [name] of [employer] Phone: [phone] Relation: Daughter  Code Status:   Allergies: Ace inhibitors; Angiotensin receptor blockers; Fosinopril sodium; and Sulfamethoxazole-trimethoprim  Chief Complaint  Patient presents with  . New Admit To SNF    HPI: Patient is 81 y.o. female with AF, on coumadin, DM2, CdHF, CKD, HTN, pancreatitis, HLD eho had a mechanical fall in [place] and sustained a femur fx. Pt was admitted to [facility] from 9/13-19 where she underwent ORIF, complicated by post -op anemia. Pt is admitted to SNF for OT/PT. EWhile at [facility] pt will be followed for HTN, tx with norvasc and metoprolol, GERD tx with protonix and HLD tx with pravachol.  Past Medical History  Diagnosis Date  . History of kidney stones   . Atrial fibrillation ([place]) 1/11     Dr Gwenlyn Found  . Ulcer     Gastric  . Esophageal stricture   . Hiatal hernia   . Diarrhea   . Memory loss   . Heart failure     chronic diatolic- 2 D echo 4/70 EF 60%  . Microalbuminuria   . Anemia     Pernicious  . Diabetes mellitus     w/neuro manifestations, type II  . Chronic kidney disease     chronic, stage III  . Cystitis   . Osteoarthrosis involving multiple sites   . Swelling     limb, left more than right ankle   . Back pain   . Peptic ulcer     unspec. w/o obstruction  . Senile osteoporosis   . IBS (irritable bowel syndrome)   . Hypertension   . Tubular adenoma of colon   . GERD (gastroesophageal reflux disease)   . Heart murmur   . Hyperlipidemia   . Thyroid disease   . Pancreatitis   . H/O cardiac catheterization 2010    normal coronary arteries, EF 60%   . Hx of echocardiogram 04/04/13    EF 55-60%, mild MR  . PAF (paroxysmal atrial fibrillation) ([place])     Last DCCV 12/29/2009  . Chronic anticoagulation     on coumadin  . Symptomatic bradycardia 01/2010    CHB, temp pacer and then Medtronic PPM  . RBBB   . Non-stress test nonreactive 11/14    Lexiscan myoview negative for ischemia  . Complete heart block ([place]) 01/31/2015    Past Surgical History  Procedure Laterality Date  . Appendectomy    . Egd- esophageal stricture      gastric ulcer  . Carotid dopplers- no significant stenosis  2007  . Tonsillectomy and adenoidectomy    . Cataract extraction      both eyes  . Pacemaker placement  2/11    medtronic dual chamber  . Cardiac catheterization  2010    normal coronary arteries      Medication List       This list is accurate as of: 09/22/15 11:59 PM.  Always use your most recent med list.               albuterol 108 (90 BASE) MCG/ACT inhaler  Commonly known as:  PROAIR HFA  Inhale 2 puffs into the lungs 2 (two)  times daily.     AMBULATORY NON FORMULARY MEDICATION  Accu-Check Compact Plus Test Strips in Drum - Diagnosis: E10.65, Blood sugars need to be checked 3 times daily     aspirin 81 MG tablet  Take 81 mg by mouth as needed.     Calcium Carbonate-Vitamin D 500-125 MG-UNIT Tabs  Take 1 tablet by mouth 2 (two) times daily with a meal.     cephALEXin 500 MG capsule  Commonly known as:  KEFLEX  Take 1 capsule (500 mg total) by mouth 3 (three) times daily. X 4 day.     clotrimazole-betamethasone cream  Commonly known as:  LOTRISONE  Apply 1 application topically 2 (two) times daily.     EASY COMFORT INSULIN SYRINGE 30G X 5/16" 0.5 ML Misc  Generic drug:  Insulin Syringe-Needle U-100  by Does not apply route as directed.     furosemide 40 MG tablet  Commonly known as:  LASIX  TAKE ONE-HALF TABLETBY MOUTH DAILY AS NEEDED FOR EDEMA.     gabapentin 600 MG tablet  Commonly known as:  NEURONTIN  TAKE ONE TABLET  BY MOUTH THREE TIMES DAILY     glucagon 1 MG injection  Inject 1 mg into the vein once as needed. Once as needed if found unresponsive and not able to eat or drink to get sugar up.     HYDROcodone-acetaminophen 5-325 MG tablet  Commonly known as:  NORCO/VICODIN  Take 1 tablet by mouth every 6 (six) hours as needed. Take with food     hyoscyamine 0.375 MG 12 hr tablet  Commonly known as:  LEVBID  Take 1 tablet (0.375 mg total) by mouth every 12 (twelve) hours as needed.     ibuprofen 200 MG tablet  Commonly known as:  ADVIL,MOTRIN  Take 400 mg by mouth as needed.     insulin lispro 100 UNIT/ML KiwkPen  Commonly known as:  HUMALOG  Inject 0.08 mLs (8 Units total) into the skin 3 (three) times daily.     LANTUS SOLOSTAR 100 UNIT/ML Solostar Pen  Generic drug:  Insulin Glargine  Inject 28 Units into the skin 2 (two) times daily.     levothyroxine 100 MCG tablet  Commonly known as:  SYNTHROID, LEVOTHROID  TAKE ONE TABLET BY MOUTH ONE TIME DAILY     metoprolol tartrate 25 MG tablet  Commonly known as:  LOPRESSOR  TAKE ONE TABLET BY MOUTH TWICE DAILY     nortriptyline 25 MG capsule  Commonly known as:  PAMELOR  TAKE ONE OR TWO CAPSULES BY MOUTH NIGHTLY AT BEDTIME     pantoprazole 40 MG tablet  Commonly known as:  PROTONIX  Take 1 tablet (40 mg total) by mouth daily.     pravastatin 40 MG tablet  Commonly known as:  PRAVACHOL  TAKE ONE TABLET BY MOUTH NIGHTLY AT BEDTIME     sitaGLIPtin 100 MG tablet  Commonly known as:  JANUVIA  Take 1 tablet (100 mg total) by mouth daily.     triamcinolone ointment 0.5 %  Commonly known as:  KENALOG  Apply 1 application topically daily as needed.     warfarin 5 MG tablet  Commonly known as:  COUMADIN  Take 1 tablet by mouth daily or as directed by coumadin clinic     zolpidem 5 MG tablet  Commonly known as:  AMBIEN  Take 0.5-1 tablets (2.5-5 mg total) by mouth at bedtime as needed for sleep.        No orders  of the defined  types were placed in this encounter.    Immunization History  Administered Date(s) Administered  . H1N1 01/22/2009  . Influenza Split 09/17/2011, 10/27/2012  . Influenza Whole 10/03/2006, 10/30/2007, 10/02/2008, 12/03/2009, 10/07/2010  . Influenza,inj,Quad PF,36+ Mos 09/19/2013, 09/04/2014  . Pneumococcal Conjugate-13 03/26/2015  . Pneumococcal Polysaccharide-23 02/25/1996  . Td 10/03/2006    Social History  Substance Use Topics  . Smoking status: Never Smoker   . Smokeless tobacco: Not on file  . Alcohol Use: No    Family history is + HTN, MI   Review of Systems  DATA OBTAINED: from patient, nurse, medical record GENERAL:  no fevers, fatigue, appetite changes SKIN: No itching, rash or wounds EYES: No eye pain, redness, discharge EARS: No earache, tinnitus, change in hearing NOSE: No congestion, drainage or bleeding  MOUTH/THROAT: No mouth or tooth pain, No sore throat RESPIRATORY: No cough, wheezing, SOB CARDIAC: No chest pain, palpitations, lower extremity edema  GI: No abdominal pain, No N/V/D or constipation, No heartburn or reflux  GU: No dysuria, frequency or urgency, or incontinence  MUSCULOSKELETAL: No unrelieved bone/joint pain NEUROLOGIC: No headache, dizziness or focal weakness PSYCHIATRIC: No c/o anxiety or sadness   Filed Vitals:   09/29/15 1744  BP: 140/61  Pulse: 62  Temp: 97 F (36.1 C)  Resp: 19    SpO2 Readings from Last 1 Encounters:  05/14/15 94%        Physical Exam  GENERAL APPEARANCE: Alert, conversant,  No acute distress.  SKIN: No diaphoresis rash HEAD: Normocephalic, atraumatic  EYES: Conjunctiva/lids clear. Pupils round, reactive. EOMs intact.  EARS: External exam WNL, canals clear. Hearing grossly normal.  NOSE: No deformity or discharge.  MOUTH/THROAT: Lips w/o lesions  RESPIRATORY: Breathing is even, unlabored. Lung sounds are clear   CARDIOVASCULAR: Heart RRR no murmurs, rubs or gallops. No peripheral edema.    GASTROINTESTINAL: Abdomen is soft, non-tender, not distended w/ normal bowel sounds. GENITOURINARY: Bladder non tender, not distended  MUSCULOSKELETAL:post op dressing NEUROLOGIC:  Cranial nerves 2-12 grossly intact. Moves all extremities  PSYCHIATRIC: Mood and affect appropriate to situation, no behavioral issues  Patient Active Problem List   Diagnosis Date Noted  . Fracture of distal femur ([place]) 09/29/2015  . Postoperative anemia due to acute blood loss 09/29/2015  . GERD (gastroesophageal reflux disease) 09/29/2015  . Hyperlipidemia 09/29/2015  . Aortic atherosclerosis ([place]) 07/10/2015  . Memory impairment of gradual onset 05/14/2015  . Complete heart block ([place]) 01/31/2015  . Insomnia 06/05/2014  . DOE (dyspnea on exertion) 10/17/2013  . PAF (paroxysmal atrial fibrillation) ([place]) 10/17/2013  . Chronic anticoagulation, on coumadin for PAF 10/17/2013  . Long term (current) use of anticoagulants 03/21/2013  . Adhesive capsulitis of right shoulder 02/14/2013  . Diabetic neuropathy ([place]) 02/07/2013  . TIA (transient ischemic attack) 11/08/2012  . Osteoporosis 08/01/2012  . Pacemaker 11/24/2011  . Cervicalgia 02/10/2011  . CEPHALGIA 02/10/2011  . VENOUS STASIS ULCER 10/26/2010  . LEG CRAMPS, NOCTURNAL 06/15/2010  . Hypothyroidism 06/26/2009  . SYNCOPE, CHB, with placement of PPM medtronic 2011 06/26/2009  . TRICUSPID REGURGITATION 02/07/2009  . Pulmonary artery hypertension ([place]) 02/07/2009  . RIGHT BUNDLE BRANCH BLOCK 01/22/2009  . COLLAGENOUS COLITIS 10/31/2008  . ESOPHAGEAL STRICTURE 10/29/2008  . HIATAL HERNIA 10/29/2008  . GASTRIC ULCER, HX OF 10/29/2008  . COLONIC POLYPS, HYPERPLASTIC, HX OF 10/29/2008  . MEMORY LOSS 10/23/2008  . OTHER CATARACT 10/02/2008  . CHRONIC DIASTOLIC HEART FAILURE 56/31/4970  . MICROALBUMINURIA 10/02/2008  . Coronary atherosclerosis 07/11/2007  .  ATHEROSCLEROSIS NOS 07/11/2007  . DM (diabetes mellitus) type II controlled, neurological  manifestation ([place]) 07/04/2007  . PERNICIOUS ANEMIA 07/04/2007  . KIDNEY DISEASE, CHRONIC, STAGE III 03/27/2007  . CYSTITIS, CHRONIC 01/09/2007  . OSTEOARTHROSIS, GENERALIZED, MULTIPLE SITES 12/05/2006  . BACK PAIN 11/07/2006  . Hypertensive heart disease with CHF (congestive heart failure) ([place]) 10/04/2006  . PEPTIC ULCER DIS., UNSPEC. W/O OBSTRUCTION 10/04/2006  . IRRITABLE BOWEL SYNDROME 10/04/2006  . SENILE OSTEOPOROSIS 10/04/2006    CBC    Component Value Date/Time   WBC 7.9 05/14/2015 1149   RBC 3.86* 05/14/2015 1149   RBC 2.89* 02/02/2010 1430   HGB 12.3 05/14/2015 1149   HCT 37.9 05/14/2015 1149   PLT 259 05/14/2015 1149   MCV 98.2 05/14/2015 1149   LYMPHSABS 1.2 10/23/2014 1148   MONOABS 0.6 10/23/2014 1148   EOSABS 0.2 10/23/2014 1148   BASOSABS 0.2* 10/23/2014 1148    CMP     Component Value Date/Time   NA 146* 07/09/2015 1000   K 5.0 07/09/2015 1000   CL 106 07/09/2015 1000   CO2 28 07/09/2015 1000   GLUCOSE 64* 07/09/2015 1000   BUN 14 07/09/2015 1000   BUN 21 09/22/2011   CREATININE 1.29* 07/09/2015 1000   CREATININE 1.8* 09/22/2011   CREATININE 1.65* 03/11/2011 0006   CALCIUM 9.7 07/09/2015 1000   PROT 6.4 07/09/2015 1000   ALBUMIN 4.0 07/09/2015 1000   AST 29 07/09/2015 1000   ALT 20 07/09/2015 1000   ALKPHOS 69 07/09/2015 1000   BILITOT 0.6 07/09/2015 1000   GFRNONAA 38* 07/09/2015 1000   GFRNONAA 39* 02/04/2010 0540   GFRAA 44* 07/09/2015 1000   GFRAA * 02/04/2010 0540    48        The eGFR has been calculated using the MDRD equation. This calculation has not been validated in all clinical situations. eGFR's persistently <60 mL/min signify possible Chronic Kidney Disease.    Lab Results  Component Value Date   HGBA1C 9.3 07/09/2015     Dg Ankle Complete Right  03/22/2015   CLINICAL DATA:  Lateral malleolar ankle pain for 1 week, no trauma. Initial encounter.  EXAM: RIGHT ANKLE - COMPLETE 3+ VIEW  COMPARISON:  03/28/2013  FINDINGS:  Bones are subjectively osteopenic. Mortise is symmetric. Plantar calcaneal spurring is noted. No fracture or dislocation identified. Diffuse mild soft tissue prominence is identified without focal abnormality. No radiopaque foreign body.  IMPRESSION: Allowing for osteopenia, no acute osseous abnormality is identified. If symptoms persist, consider reimaging in 7-10 days for detection of possibly radiographically occult fracture.   Electronically Signed   By: Conchita Paris M.D.   On: 03/22/2015 17:47    Not all labs, radiology exams or other studies done during hospitalization come through on my EPIC note; however they are reviewed by me.    Assessment and Plan  Fracture of distal femur ([place]) S/p ORIF; SNF - OT/PT  Postoperative anemia due to acute blood loss S/p 1 u PRBC to bring to d/c Hb 8.3; SNF - follow-up CBC  PAF (paroxysmal atrial fibrillation) Seen by cards in hospita, reported stable; SNF - cont metoprolol for rate and wafarin as prophylaxis  Hypertensive heart disease with CHF (congestive heart failure) (HCC) SNF - cont  Metoprolol   CHRONIC DIASTOLIC HEART FAILURE SNF - no evidence for instability; cont metoprolol and lasix prn  GERD (gastroesophageal reflux disease) SNF - with h/o gastric ulcer; cont protonix  DM (diabetes mellitus) type II controlled, neurological manifestation SNF - cont  insulin and januvia; follow BS  Diabetic neuropathy SNF - cont neurontin  Hypothyroidism SNF - TSH 0.858 in 04/2015; cont levothyroxine 25 mcg  Hyperlipidemia SNF - cont pravachol 40 mg daily   Time spent > 45 min;> 50% of time with patient was spent reviewing records, labs, tests and studies, counseling and developing plan of care  Hennie Duos, MD

## 2015-09-29 ENCOUNTER — Encounter: Payer: Self-pay | Admitting: Internal Medicine

## 2015-09-29 DIAGNOSIS — S72409A Unspecified fracture of lower end of unspecified femur, initial encounter for closed fracture: Secondary | ICD-10-CM | POA: Insufficient documentation

## 2015-09-29 DIAGNOSIS — E785 Hyperlipidemia, unspecified: Secondary | ICD-10-CM | POA: Insufficient documentation

## 2015-09-29 DIAGNOSIS — D62 Acute posthemorrhagic anemia: Secondary | ICD-10-CM | POA: Insufficient documentation

## 2015-09-29 DIAGNOSIS — K219 Gastro-esophageal reflux disease without esophagitis: Secondary | ICD-10-CM | POA: Insufficient documentation

## 2015-09-29 NOTE — Assessment & Plan Note (Signed)
SNF - cont insulin and januvia; follow BS

## 2015-09-29 NOTE — Assessment & Plan Note (Signed)
Seen by cards in [place], reported stable; SNF - cont metoprolol for rate and wafarin as prophylaxis

## 2015-09-29 NOTE — Assessment & Plan Note (Signed)
S/p ORIF; SNF - OT/PT

## 2015-09-29 NOTE — Assessment & Plan Note (Signed)
SNF - cont pravachol 40 mg daily

## 2015-09-29 NOTE — Assessment & Plan Note (Signed)
SNF - TSH 0.858 in 04/2015; cont levothyroxine 25 mcg

## 2015-09-29 NOTE — Assessment & Plan Note (Signed)
S/p 1 u PRBC to bring to d/c Hb 8.3; SNF - follow-up CBC

## 2015-09-29 NOTE — Assessment & Plan Note (Signed)
SNF - with h/o gastric ulcer; cont protonix

## 2015-09-29 NOTE — Assessment & Plan Note (Addendum)
SNF - no evidence for instability; cont metoprolol and lasix prn

## 2015-09-29 NOTE — Assessment & Plan Note (Addendum)
SNF - cont  Metoprolol

## 2015-09-29 NOTE — Assessment & Plan Note (Signed)
SNF - cont neurontin

## 2015-10-02 ENCOUNTER — Non-Acute Institutional Stay (SKILLED_NURSING_FACILITY): Payer: Medicare Other | Admitting: Internal Medicine

## 2015-10-02 ENCOUNTER — Encounter: Payer: Self-pay | Admitting: Internal Medicine

## 2015-10-02 ENCOUNTER — Telehealth: Payer: Self-pay

## 2015-10-02 DIAGNOSIS — I5032 Chronic diastolic (congestive) heart failure: Secondary | ICD-10-CM | POA: Diagnosis not present

## 2015-10-02 DIAGNOSIS — Z7901 Long term (current) use of anticoagulants: Secondary | ICD-10-CM

## 2015-10-02 DIAGNOSIS — I110 Hypertensive heart disease with heart failure: Secondary | ICD-10-CM

## 2015-10-02 DIAGNOSIS — E1165 Type 2 diabetes mellitus with hyperglycemia: Secondary | ICD-10-CM | POA: Insufficient documentation

## 2015-10-02 DIAGNOSIS — I480 Paroxysmal atrial fibrillation: Secondary | ICD-10-CM | POA: Diagnosis not present

## 2015-10-02 DIAGNOSIS — E1142 Type 2 diabetes mellitus with diabetic polyneuropathy: Secondary | ICD-10-CM | POA: Diagnosis not present

## 2015-10-02 DIAGNOSIS — D62 Acute posthemorrhagic anemia: Secondary | ICD-10-CM | POA: Diagnosis not present

## 2015-10-02 DIAGNOSIS — E038 Other specified hypothyroidism: Secondary | ICD-10-CM

## 2015-10-02 DIAGNOSIS — S72491D Other fracture of lower end of right femur, subsequent encounter for closed fracture with routine healing: Secondary | ICD-10-CM

## 2015-10-02 DIAGNOSIS — I4891 Unspecified atrial fibrillation: Secondary | ICD-10-CM

## 2015-10-02 DIAGNOSIS — IMO0002 Reserved for concepts with insufficient information to code with codable children: Secondary | ICD-10-CM | POA: Insufficient documentation

## 2015-10-02 DIAGNOSIS — E034 Atrophy of thyroid (acquired): Secondary | ICD-10-CM

## 2015-10-02 NOTE — Telephone Encounter (Signed)
Standing Released orders for PT/INR draw at [facility].

## 2015-10-02 NOTE — Progress Notes (Signed)
MRN: [id number] Name: [name]  Sex: female Age: 81 y.o. DOB: Feb 16, 1928  [place] #: Helene Kelp Facility/Room:212 Level Of Care: SNF Provider: Inocencio Homes D Emergency Contacts: Extended Emergency Contact Information Primary Emergency Contact: [facility] Address: [address]          [place], [place] [address] [name] of [employer] Phone: [phone] Relation: Daughter  Code Status: FULL  Allergies: Ace inhibitors; Angiotensin receptor blockers; Fosinopril sodium; and Sulfamethoxazole-trimethoprim  Chief Complaint  Patient presents with  . Discharge Note    HPI: Patient is 81 y.o. female with AF, on coumadin, DM2, CdHF, CKD, HTN, pancreatitis, HLD eho had a mechanical fall in [place] and sustained a femur fx. Pt was admitted to [facility] from 9/13-19 where she underwent ORIF, complicated by post -op anemia. Pt is admitted to SNF for OT/PT. Pt is now ready to be d/c to home.  Past Medical History  Diagnosis Date  . History of kidney stones   . Atrial fibrillation ([place]) 1/11     Dr Gwenlyn Found  . Ulcer     Gastric  . Esophageal stricture   . Hiatal hernia   . Diarrhea   . Memory loss   . Heart failure     chronic diatolic- 2 D echo 9/70 EF 60%  . Microalbuminuria   . Anemia     Pernicious  . Diabetes mellitus     w/neuro manifestations, type II  . Chronic kidney disease     chronic, stage III  . Cystitis   . Osteoarthrosis involving multiple sites   . Swelling     limb, left more than right ankle   . Back pain   . Peptic ulcer     unspec. w/o obstruction  . Senile osteoporosis   . IBS (irritable bowel syndrome)   . Hypertension   . Tubular adenoma of colon   . GERD (gastroesophageal reflux disease)   . Heart murmur   . Hyperlipidemia   . Thyroid disease   . Pancreatitis   . H/O cardiac catheterization 2010    normal coronary arteries, EF 60%  . Hx of echocardiogram 04/04/13    EF 55-60%, mild MR  . PAF (paroxysmal atrial  fibrillation) ([place])     Last DCCV 12/29/2009  . Chronic anticoagulation     on coumadin  . Symptomatic bradycardia 01/2010    CHB, temp pacer and then Medtronic PPM  . RBBB   . Non-stress test nonreactive 11/14    Lexiscan myoview negative for ischemia  . Complete heart block ([place]) 01/31/2015    Past Surgical History  Procedure Laterality Date  . Appendectomy    . Egd- esophageal stricture      gastric ulcer  . Carotid dopplers- no significant stenosis  2007  . Tonsillectomy and adenoidectomy    . Cataract extraction      both eyes  . Pacemaker placement  2/11    medtronic dual chamber  . Cardiac catheterization  2010    normal coronary arteries      Medication List       This list is accurate as of: 10/02/15 12:48 PM.  Always use your most recent med list.               albuterol 108 (90 BASE) MCG/ACT inhaler  Commonly known as:  PROAIR HFA  Inhale 2 puffs into the lungs 2 (two) times daily.     AMBULATORY NON FORMULARY MEDICATION  Accu-Check Compact Plus Test Strips  in [place] - Diagnosis: E10.65, Blood sugars need to be checked 3 times daily     aspirin 81 MG tablet  Take 81 mg by mouth as needed.     Calcium Carbonate-Vitamin D 500-125 MG-UNIT Tabs  Take 1 tablet by mouth 2 (two) times daily with a meal.     cephALEXin 500 MG capsule  Commonly known as:  KEFLEX  Take 1 capsule (500 mg total) by mouth 3 (three) times daily. X 4 day.     clotrimazole-betamethasone cream  Commonly known as:  LOTRISONE  Apply 1 application topically 2 (two) times daily.     EASY COMFORT INSULIN SYRINGE 30G X 5/16" 0.5 ML Misc  Generic drug:  Insulin Syringe-Needle U-100  by Does not apply route as directed.     furosemide 40 MG tablet  Commonly known as:  LASIX  TAKE ONE-HALF TABLETBY MOUTH DAILY AS NEEDED FOR EDEMA.     gabapentin 600 MG tablet  Commonly known as:  NEURONTIN  TAKE ONE TABLET BY MOUTH THREE TIMES DAILY     glucagon 1 MG injection  Inject 1 mg into the  vein once as needed. Once as needed if found unresponsive and not able to eat or drink to get sugar up.     HYDROcodone-acetaminophen 5-325 MG tablet  Commonly known as:  NORCO/VICODIN  Take 1 tablet by mouth every 6 (six) hours as needed. Take with food     hyoscyamine 0.375 MG 12 hr tablet  Commonly known as:  LEVBID  Take 1 tablet (0.375 mg total) by mouth every 12 (twelve) hours as needed.     ibuprofen 200 MG tablet  Commonly known as:  ADVIL,MOTRIN  Take 400 mg by mouth as needed.     insulin lispro 100 UNIT/ML KiwkPen  Commonly known as:  HUMALOG  Inject 0.08 mLs (8 Units total) into the skin 3 (three) times daily.     LANTUS SOLOSTAR 100 UNIT/ML Solostar Pen  Generic drug:  Insulin Glargine  Inject 28 Units into the skin 2 (two) times daily.     levothyroxine 100 MCG tablet  Commonly known as:  SYNTHROID, LEVOTHROID  TAKE ONE TABLET BY MOUTH ONE TIME DAILY     metoprolol tartrate 25 MG tablet  Commonly known as:  LOPRESSOR  TAKE ONE TABLET BY MOUTH TWICE DAILY     nortriptyline 25 MG capsule  Commonly known as:  PAMELOR  TAKE ONE OR TWO CAPSULES BY MOUTH NIGHTLY AT BEDTIME     pantoprazole 40 MG tablet  Commonly known as:  PROTONIX  Take 1 tablet (40 mg total) by mouth daily.     pravastatin 40 MG tablet  Commonly known as:  PRAVACHOL  TAKE ONE TABLET BY MOUTH NIGHTLY AT BEDTIME     sitaGLIPtin 100 MG tablet  Commonly known as:  JANUVIA  Take 1 tablet (100 mg total) by mouth daily.     triamcinolone ointment 0.5 %  Commonly known as:  KENALOG  Apply 1 application topically daily as needed.     warfarin 5 MG tablet  Commonly known as:  COUMADIN  Take 1 tablet by mouth daily or as directed by coumadin clinic     zolpidem 5 MG tablet  Commonly known as:  AMBIEN  Take 0.5-1 tablets (2.5-5 mg total) by mouth at bedtime as needed for sleep.        No orders of the defined types were placed in this encounter.    Immunization History  Administered  Date(s) Administered  . H1N1 01/22/2009  . Influenza Split 09/17/2011, 10/27/2012  . Influenza Whole 10/03/2006, 10/30/2007, 10/02/2008, 12/03/2009, 10/07/2010  . Influenza,inj,Quad PF,36+ Mos 09/19/2013, 09/04/2014  . Pneumococcal Conjugate-13 03/26/2015  . Pneumococcal Polysaccharide-23 02/25/1996  . Td 10/03/2006    Social History  Substance Use Topics  . Smoking status: Never Smoker   . Smokeless tobacco: Not on file  . Alcohol Use: No    Filed Vitals:   10/02/15 1245  BP: 113/68  Pulse: 78  Temp: 97.4 F (36.3 C)  Resp: 18    Physical Exam  GENERAL APPEARANCE: Alert, conversant. No acute distress.  HEENT: Unremarkable. RESPIRATORY: Breathing is even, unlabored. Lung sounds are clear   CARDIOVASCULAR: Heart RRR no murmurs, rubs or gallops. No peripheral edema.  GASTROINTESTINAL: Abdomen is soft, non-tender, not distended w/ normal bowel sounds.  NEUROLOGIC: Cranial nerves 2-12 grossly intact. Moves all extremities  Patient Active Problem List   Diagnosis Date Noted  . Fracture of distal femur ([place]) 09/29/2015  . Postoperative anemia due to acute blood loss 09/29/2015  . GERD (gastroesophageal reflux disease) 09/29/2015  . Hyperlipidemia 09/29/2015  . Aortic atherosclerosis ([place]) 07/10/2015  . Memory impairment of gradual onset 05/14/2015  . Complete heart block ([place]) 01/31/2015  . Insomnia 06/05/2014  . DOE (dyspnea on exertion) 10/17/2013  . PAF (paroxysmal atrial fibrillation) ([place]) 10/17/2013  . Chronic anticoagulation, on coumadin for PAF 10/17/2013  . Long term (current) use of anticoagulants 03/21/2013  . Adhesive capsulitis of right shoulder 02/14/2013  . Diabetic neuropathy ([place]) 02/07/2013  . TIA (transient ischemic attack) 11/08/2012  . Osteoporosis 08/01/2012  . Pacemaker 11/24/2011  . Cervicalgia 02/10/2011  . CEPHALGIA 02/10/2011  . VENOUS STASIS ULCER 10/26/2010  . LEG CRAMPS, NOCTURNAL 06/15/2010  . Hypothyroidism 06/26/2009  . SYNCOPE,  CHB, with placement of PPM medtronic 2011 06/26/2009  . TRICUSPID REGURGITATION 02/07/2009  . Pulmonary artery hypertension ([place]) 02/07/2009  . RIGHT BUNDLE BRANCH BLOCK 01/22/2009  . COLLAGENOUS COLITIS 10/31/2008  . ESOPHAGEAL STRICTURE 10/29/2008  . HIATAL HERNIA 10/29/2008  . GASTRIC ULCER, HX OF 10/29/2008  . COLONIC POLYPS, HYPERPLASTIC, HX OF 10/29/2008  . MEMORY LOSS 10/23/2008  . OTHER CATARACT 10/02/2008  . CHRONIC DIASTOLIC HEART FAILURE 53/97/6734  . MICROALBUMINURIA 10/02/2008  . Coronary atherosclerosis 07/11/2007  . ATHEROSCLEROSIS NOS 07/11/2007  . DM (diabetes mellitus) type II controlled, neurological manifestation ([place]) 07/04/2007  . PERNICIOUS ANEMIA 07/04/2007  . KIDNEY DISEASE, CHRONIC, STAGE III 03/27/2007  . CYSTITIS, CHRONIC 01/09/2007  . OSTEOARTHROSIS, GENERALIZED, MULTIPLE SITES 12/05/2006  . BACK PAIN 11/07/2006  . Hypertensive heart disease with CHF (congestive heart failure) ([place]) 10/04/2006  . PEPTIC ULCER DIS., UNSPEC. W/O OBSTRUCTION 10/04/2006  . IRRITABLE BOWEL SYNDROME 10/04/2006  . SENILE OSTEOPOROSIS 10/04/2006    CBC    Component Value Date/Time   WBC 7.9 05/14/2015 1149   RBC 3.86* 05/14/2015 1149   RBC 2.89* 02/02/2010 1430   HGB 12.3 05/14/2015 1149   HCT 37.9 05/14/2015 1149   PLT 259 05/14/2015 1149   MCV 98.2 05/14/2015 1149   LYMPHSABS 1.2 10/23/2014 1148   MONOABS 0.6 10/23/2014 1148   EOSABS 0.2 10/23/2014 1148   BASOSABS 0.2* 10/23/2014 1148    CMP     Component Value Date/Time   NA 146* 07/09/2015 1000   K 5.0 07/09/2015 1000   CL 106 07/09/2015 1000   CO2 28 07/09/2015 1000   GLUCOSE 64* 07/09/2015 1000   BUN 14 07/09/2015 1000   BUN 21 09/22/2011  CREATININE 1.29* 07/09/2015 1000   CREATININE 1.8* 09/22/2011   CREATININE 1.65* 03/11/2011 0006   CALCIUM 9.7 07/09/2015 1000   PROT 6.4 07/09/2015 1000   ALBUMIN 4.0 07/09/2015 1000   AST 29 07/09/2015 1000   ALT 20 07/09/2015 1000   ALKPHOS 69 07/09/2015  1000   BILITOT 0.6 07/09/2015 1000   GFRNONAA 38* 07/09/2015 1000   GFRNONAA 39* 02/04/2010 0540   GFRAA 44* 07/09/2015 1000   GFRAA * 02/04/2010 0540    48        The eGFR has been calculated using the MDRD equation. This calculation has not been validated in all clinical situations. eGFR's persistently <60 mL/min signify possible Chronic Kidney Disease.    Assessment and Plan  Pt is d/c to home with HH/OT/PT. DME are hospital bed, standard wheelchair, extra wide commode. Coumadin - INR went high on 70m , high on 4 mg, on 2 mg INR was 1.1 and 1.0. So on 10/6 3 mg daily started. PT/INR will be needed 10/06/2015 with results to PCP.  Time spent > 35 min  AHennie Duos MD

## 2015-10-05 DIAGNOSIS — E119 Type 2 diabetes mellitus without complications: Secondary | ICD-10-CM | POA: Diagnosis not present

## 2015-10-05 DIAGNOSIS — Z9181 History of falling: Secondary | ICD-10-CM | POA: Diagnosis not present

## 2015-10-05 DIAGNOSIS — M1830 Unilateral post-traumatic osteoarthritis of first carpometacarpal joint, unspecified hand: Secondary | ICD-10-CM | POA: Diagnosis not present

## 2015-10-05 DIAGNOSIS — I129 Hypertensive chronic kidney disease with stage 1 through stage 4 chronic kidney disease, or unspecified chronic kidney disease: Secondary | ICD-10-CM | POA: Diagnosis not present

## 2015-10-05 DIAGNOSIS — S72351D Displaced comminuted fracture of shaft of right femur, subsequent encounter for closed fracture with routine healing: Secondary | ICD-10-CM | POA: Diagnosis not present

## 2015-10-05 DIAGNOSIS — G8929 Other chronic pain: Secondary | ICD-10-CM | POA: Diagnosis not present

## 2015-10-05 DIAGNOSIS — Z950 Presence of cardiac pacemaker: Secondary | ICD-10-CM | POA: Diagnosis not present

## 2015-10-05 DIAGNOSIS — N183 Chronic kidney disease, stage 3 (moderate): Secondary | ICD-10-CM | POA: Diagnosis not present

## 2015-10-05 DIAGNOSIS — K219 Gastro-esophageal reflux disease without esophagitis: Secondary | ICD-10-CM | POA: Diagnosis not present

## 2015-10-05 DIAGNOSIS — M6281 Muscle weakness (generalized): Secondary | ICD-10-CM | POA: Diagnosis not present

## 2015-10-05 DIAGNOSIS — Z794 Long term (current) use of insulin: Secondary | ICD-10-CM | POA: Diagnosis not present

## 2015-10-05 DIAGNOSIS — E039 Hypothyroidism, unspecified: Secondary | ICD-10-CM | POA: Diagnosis not present

## 2015-10-05 DIAGNOSIS — E785 Hyperlipidemia, unspecified: Secondary | ICD-10-CM | POA: Diagnosis not present

## 2015-10-05 DIAGNOSIS — I482 Chronic atrial fibrillation: Secondary | ICD-10-CM | POA: Diagnosis not present

## 2015-10-06 ENCOUNTER — Telehealth: Payer: Self-pay | Admitting: Cardiovascular Disease

## 2015-10-06 ENCOUNTER — Telehealth: Payer: Self-pay | Admitting: *Deleted

## 2015-10-06 ENCOUNTER — Ambulatory Visit: Payer: Medicare Other | Admitting: Pharmacist Clinician (PhC)/ Clinical Pharmacy Specialist

## 2015-10-06 DIAGNOSIS — Z7901 Long term (current) use of anticoagulants: Secondary | ICD-10-CM

## 2015-10-06 DIAGNOSIS — S72491D Other fracture of lower end of right femur, subsequent encounter for closed fracture with routine healing: Secondary | ICD-10-CM

## 2015-10-06 DIAGNOSIS — D62 Acute posthemorrhagic anemia: Secondary | ICD-10-CM

## 2015-10-06 MED ORDER — AMBULATORY NON FORMULARY MEDICATION: Status: DC

## 2015-10-06 NOTE — Telephone Encounter (Signed)
[name] called in requesting that some home health orders for her to have her PT INR checked at home due to her recent hospitalization from a fall. She provided a fax number of [phone]. Please f/u with her if you need to  Thanks

## 2015-10-06 NOTE — Telephone Encounter (Signed)
[name]  Pt from [place] called and states she needs an order for Home Health to do PT  3 x a week for for 1 week and then 2 x a week for 8 weeks. She also would like an order for a home health aid to come in  I will put the order in for this. Pt's family is going to call Dr.Ramos' office to schedule a f/u appt with them.  Claiborne Billings states if we just send the order to her then she can note on the order how often the home health aid needs to come out to her house.

## 2015-10-07 ENCOUNTER — Telehealth: Payer: Self-pay | Admitting: Cardiovascular Disease

## 2015-10-07 DIAGNOSIS — G8929 Other chronic pain: Secondary | ICD-10-CM | POA: Diagnosis not present

## 2015-10-07 DIAGNOSIS — I482 Chronic atrial fibrillation: Secondary | ICD-10-CM | POA: Diagnosis not present

## 2015-10-07 DIAGNOSIS — E039 Hypothyroidism, unspecified: Secondary | ICD-10-CM | POA: Diagnosis not present

## 2015-10-07 DIAGNOSIS — I129 Hypertensive chronic kidney disease with stage 1 through stage 4 chronic kidney disease, or unspecified chronic kidney disease: Secondary | ICD-10-CM | POA: Diagnosis not present

## 2015-10-07 DIAGNOSIS — K219 Gastro-esophageal reflux disease without esophagitis: Secondary | ICD-10-CM | POA: Diagnosis not present

## 2015-10-07 DIAGNOSIS — N183 Chronic kidney disease, stage 3 (moderate): Secondary | ICD-10-CM | POA: Diagnosis not present

## 2015-10-07 DIAGNOSIS — S72351D Displaced comminuted fracture of shaft of right femur, subsequent encounter for closed fracture with routine healing: Secondary | ICD-10-CM | POA: Diagnosis not present

## 2015-10-07 DIAGNOSIS — Z794 Long term (current) use of insulin: Secondary | ICD-10-CM | POA: Diagnosis not present

## 2015-10-07 DIAGNOSIS — E785 Hyperlipidemia, unspecified: Secondary | ICD-10-CM | POA: Diagnosis not present

## 2015-10-07 DIAGNOSIS — Z950 Presence of cardiac pacemaker: Secondary | ICD-10-CM | POA: Diagnosis not present

## 2015-10-07 DIAGNOSIS — M6281 Muscle weakness (generalized): Secondary | ICD-10-CM | POA: Diagnosis not present

## 2015-10-07 DIAGNOSIS — Z9181 History of falling: Secondary | ICD-10-CM | POA: Diagnosis not present

## 2015-10-07 DIAGNOSIS — E119 Type 2 diabetes mellitus without complications: Secondary | ICD-10-CM | POA: Diagnosis not present

## 2015-10-07 LAB — POCT INR: INR: 1.4

## 2015-10-07 NOTE — Telephone Encounter (Signed)
Ovid Curd - could you please fax an order for INR to be drawn sometime this week and call results to my phone x [phone].  Thank you  Erasmo Downer

## 2015-10-07 NOTE — Telephone Encounter (Signed)
[facility] called w/ INR of 1.4 taken today.  Wanted to know how often to recheck, as well as dose change recommendations. [name] requests call back to her regarding recheck frequency.  Pt currently taking 5mg  warfarin M, W, F and 2.5 on other days.   >> Routed to [place].

## 2015-10-07 NOTE — Telephone Encounter (Signed)
Order faxed w/ instructions.

## 2015-10-08 ENCOUNTER — Ambulatory Visit (INDEPENDENT_AMBULATORY_CARE_PROVIDER_SITE_OTHER): Payer: Medicare Other | Admitting: Pharmacist

## 2015-10-08 DIAGNOSIS — Z7901 Long term (current) use of anticoagulants: Secondary | ICD-10-CM

## 2015-10-08 NOTE — Telephone Encounter (Signed)
Spoke with Mickel Baas.  INR addressed.  See anti-coag note for details.

## 2015-10-09 DIAGNOSIS — S72351D Displaced comminuted fracture of shaft of right femur, subsequent encounter for closed fracture with routine healing: Secondary | ICD-10-CM | POA: Diagnosis not present

## 2015-10-09 DIAGNOSIS — Z794 Long term (current) use of insulin: Secondary | ICD-10-CM | POA: Diagnosis not present

## 2015-10-09 DIAGNOSIS — K219 Gastro-esophageal reflux disease without esophagitis: Secondary | ICD-10-CM | POA: Diagnosis not present

## 2015-10-09 DIAGNOSIS — N183 Chronic kidney disease, stage 3 (moderate): Secondary | ICD-10-CM | POA: Diagnosis not present

## 2015-10-09 DIAGNOSIS — Z9181 History of falling: Secondary | ICD-10-CM | POA: Diagnosis not present

## 2015-10-09 DIAGNOSIS — I129 Hypertensive chronic kidney disease with stage 1 through stage 4 chronic kidney disease, or unspecified chronic kidney disease: Secondary | ICD-10-CM | POA: Diagnosis not present

## 2015-10-09 DIAGNOSIS — Z950 Presence of cardiac pacemaker: Secondary | ICD-10-CM | POA: Diagnosis not present

## 2015-10-09 DIAGNOSIS — I482 Chronic atrial fibrillation: Secondary | ICD-10-CM | POA: Diagnosis not present

## 2015-10-09 DIAGNOSIS — E039 Hypothyroidism, unspecified: Secondary | ICD-10-CM | POA: Diagnosis not present

## 2015-10-09 DIAGNOSIS — M6281 Muscle weakness (generalized): Secondary | ICD-10-CM | POA: Diagnosis not present

## 2015-10-09 DIAGNOSIS — E119 Type 2 diabetes mellitus without complications: Secondary | ICD-10-CM | POA: Diagnosis not present

## 2015-10-09 DIAGNOSIS — G8929 Other chronic pain: Secondary | ICD-10-CM | POA: Diagnosis not present

## 2015-10-09 DIAGNOSIS — E785 Hyperlipidemia, unspecified: Secondary | ICD-10-CM | POA: Diagnosis not present

## 2015-10-13 ENCOUNTER — Ambulatory Visit: Payer: Medicare Other | Admitting: Family Medicine

## 2015-10-14 ENCOUNTER — Other Ambulatory Visit: Payer: Self-pay | Admitting: *Deleted

## 2015-10-14 ENCOUNTER — Ambulatory Visit (INDEPENDENT_AMBULATORY_CARE_PROVIDER_SITE_OTHER): Payer: Medicare Other | Admitting: Pharmacist Clinician (PhC)/ Clinical Pharmacy Specialist

## 2015-10-14 DIAGNOSIS — S72351D Displaced comminuted fracture of shaft of right femur, subsequent encounter for closed fracture with routine healing: Secondary | ICD-10-CM | POA: Diagnosis not present

## 2015-10-14 DIAGNOSIS — I482 Chronic atrial fibrillation: Secondary | ICD-10-CM | POA: Diagnosis not present

## 2015-10-14 DIAGNOSIS — E785 Hyperlipidemia, unspecified: Secondary | ICD-10-CM | POA: Diagnosis not present

## 2015-10-14 DIAGNOSIS — E039 Hypothyroidism, unspecified: Secondary | ICD-10-CM | POA: Diagnosis not present

## 2015-10-14 DIAGNOSIS — K219 Gastro-esophageal reflux disease without esophagitis: Secondary | ICD-10-CM | POA: Diagnosis not present

## 2015-10-14 DIAGNOSIS — M6281 Muscle weakness (generalized): Secondary | ICD-10-CM | POA: Diagnosis not present

## 2015-10-14 DIAGNOSIS — Z950 Presence of cardiac pacemaker: Secondary | ICD-10-CM | POA: Diagnosis not present

## 2015-10-14 DIAGNOSIS — Z9181 History of falling: Secondary | ICD-10-CM | POA: Diagnosis not present

## 2015-10-14 DIAGNOSIS — G8929 Other chronic pain: Secondary | ICD-10-CM | POA: Diagnosis not present

## 2015-10-14 DIAGNOSIS — I129 Hypertensive chronic kidney disease with stage 1 through stage 4 chronic kidney disease, or unspecified chronic kidney disease: Secondary | ICD-10-CM | POA: Diagnosis not present

## 2015-10-14 DIAGNOSIS — N183 Chronic kidney disease, stage 3 (moderate): Secondary | ICD-10-CM | POA: Diagnosis not present

## 2015-10-14 DIAGNOSIS — E119 Type 2 diabetes mellitus without complications: Secondary | ICD-10-CM | POA: Diagnosis not present

## 2015-10-14 DIAGNOSIS — Z794 Long term (current) use of insulin: Secondary | ICD-10-CM | POA: Diagnosis not present

## 2015-10-14 DIAGNOSIS — Z7901 Long term (current) use of anticoagulants: Secondary | ICD-10-CM

## 2015-10-14 LAB — POCT INR: INR: 3.1

## 2015-10-14 MED ORDER — AMBULATORY NON FORMULARY MEDICATION: Status: DC

## 2015-10-15 DIAGNOSIS — S72351D Displaced comminuted fracture of shaft of right femur, subsequent encounter for closed fracture with routine healing: Secondary | ICD-10-CM | POA: Diagnosis not present

## 2015-10-16 ENCOUNTER — Telehealth: Payer: Self-pay

## 2015-10-16 DIAGNOSIS — Z794 Long term (current) use of insulin: Secondary | ICD-10-CM | POA: Diagnosis not present

## 2015-10-16 DIAGNOSIS — I482 Chronic atrial fibrillation: Secondary | ICD-10-CM | POA: Diagnosis not present

## 2015-10-16 DIAGNOSIS — Z9181 History of falling: Secondary | ICD-10-CM | POA: Diagnosis not present

## 2015-10-16 DIAGNOSIS — S72351D Displaced comminuted fracture of shaft of right femur, subsequent encounter for closed fracture with routine healing: Secondary | ICD-10-CM | POA: Diagnosis not present

## 2015-10-16 DIAGNOSIS — N183 Chronic kidney disease, stage 3 (moderate): Secondary | ICD-10-CM | POA: Diagnosis not present

## 2015-10-16 DIAGNOSIS — K219 Gastro-esophageal reflux disease without esophagitis: Secondary | ICD-10-CM | POA: Diagnosis not present

## 2015-10-16 DIAGNOSIS — E119 Type 2 diabetes mellitus without complications: Secondary | ICD-10-CM | POA: Diagnosis not present

## 2015-10-16 DIAGNOSIS — G8929 Other chronic pain: Secondary | ICD-10-CM | POA: Diagnosis not present

## 2015-10-16 DIAGNOSIS — M6281 Muscle weakness (generalized): Secondary | ICD-10-CM | POA: Diagnosis not present

## 2015-10-16 DIAGNOSIS — Z950 Presence of cardiac pacemaker: Secondary | ICD-10-CM | POA: Diagnosis not present

## 2015-10-16 DIAGNOSIS — E039 Hypothyroidism, unspecified: Secondary | ICD-10-CM | POA: Diagnosis not present

## 2015-10-16 DIAGNOSIS — I129 Hypertensive chronic kidney disease with stage 1 through stage 4 chronic kidney disease, or unspecified chronic kidney disease: Secondary | ICD-10-CM | POA: Diagnosis not present

## 2015-10-16 DIAGNOSIS — E785 Hyperlipidemia, unspecified: Secondary | ICD-10-CM | POA: Diagnosis not present

## 2015-10-16 NOTE — Telephone Encounter (Signed)
Physical Therapist Baxter Flattery) called asking to remove staples from a right femur fracture repair. States pt is in a knee immobilizer so staples were overlooked. Asked Dr. Madilyn Fireman for permission and received a verbal order for removal.

## 2015-10-20 ENCOUNTER — Telehealth: Payer: Self-pay | Admitting: *Deleted

## 2015-10-20 DIAGNOSIS — G8929 Other chronic pain: Secondary | ICD-10-CM | POA: Diagnosis not present

## 2015-10-20 DIAGNOSIS — M6281 Muscle weakness (generalized): Secondary | ICD-10-CM | POA: Diagnosis not present

## 2015-10-20 DIAGNOSIS — Z950 Presence of cardiac pacemaker: Secondary | ICD-10-CM | POA: Diagnosis not present

## 2015-10-20 DIAGNOSIS — K219 Gastro-esophageal reflux disease without esophagitis: Secondary | ICD-10-CM | POA: Diagnosis not present

## 2015-10-20 DIAGNOSIS — S72351D Displaced comminuted fracture of shaft of right femur, subsequent encounter for closed fracture with routine healing: Secondary | ICD-10-CM | POA: Diagnosis not present

## 2015-10-20 DIAGNOSIS — Z794 Long term (current) use of insulin: Secondary | ICD-10-CM | POA: Diagnosis not present

## 2015-10-20 DIAGNOSIS — E785 Hyperlipidemia, unspecified: Secondary | ICD-10-CM | POA: Diagnosis not present

## 2015-10-20 DIAGNOSIS — I482 Chronic atrial fibrillation: Secondary | ICD-10-CM | POA: Diagnosis not present

## 2015-10-20 DIAGNOSIS — E039 Hypothyroidism, unspecified: Secondary | ICD-10-CM | POA: Diagnosis not present

## 2015-10-20 DIAGNOSIS — I129 Hypertensive chronic kidney disease with stage 1 through stage 4 chronic kidney disease, or unspecified chronic kidney disease: Secondary | ICD-10-CM | POA: Diagnosis not present

## 2015-10-20 DIAGNOSIS — N183 Chronic kidney disease, stage 3 (moderate): Secondary | ICD-10-CM | POA: Diagnosis not present

## 2015-10-20 DIAGNOSIS — Z9181 History of falling: Secondary | ICD-10-CM | POA: Diagnosis not present

## 2015-10-20 DIAGNOSIS — E119 Type 2 diabetes mellitus without complications: Secondary | ICD-10-CM | POA: Diagnosis not present

## 2015-10-20 NOTE — Telephone Encounter (Signed)
Verbal given continued OT for 2x per week for 4 weeks.[name] [place]

## 2015-10-21 DIAGNOSIS — E785 Hyperlipidemia, unspecified: Secondary | ICD-10-CM | POA: Diagnosis not present

## 2015-10-21 DIAGNOSIS — Z9181 History of falling: Secondary | ICD-10-CM | POA: Diagnosis not present

## 2015-10-21 DIAGNOSIS — I129 Hypertensive chronic kidney disease with stage 1 through stage 4 chronic kidney disease, or unspecified chronic kidney disease: Secondary | ICD-10-CM | POA: Diagnosis not present

## 2015-10-21 DIAGNOSIS — Z950 Presence of cardiac pacemaker: Secondary | ICD-10-CM | POA: Diagnosis not present

## 2015-10-21 DIAGNOSIS — K219 Gastro-esophageal reflux disease without esophagitis: Secondary | ICD-10-CM | POA: Diagnosis not present

## 2015-10-21 DIAGNOSIS — Z794 Long term (current) use of insulin: Secondary | ICD-10-CM | POA: Diagnosis not present

## 2015-10-21 DIAGNOSIS — S72351D Displaced comminuted fracture of shaft of right femur, subsequent encounter for closed fracture with routine healing: Secondary | ICD-10-CM | POA: Diagnosis not present

## 2015-10-21 DIAGNOSIS — I482 Chronic atrial fibrillation: Secondary | ICD-10-CM | POA: Diagnosis not present

## 2015-10-21 DIAGNOSIS — M6281 Muscle weakness (generalized): Secondary | ICD-10-CM | POA: Diagnosis not present

## 2015-10-21 DIAGNOSIS — N183 Chronic kidney disease, stage 3 (moderate): Secondary | ICD-10-CM | POA: Diagnosis not present

## 2015-10-21 DIAGNOSIS — E119 Type 2 diabetes mellitus without complications: Secondary | ICD-10-CM | POA: Diagnosis not present

## 2015-10-21 DIAGNOSIS — G8929 Other chronic pain: Secondary | ICD-10-CM | POA: Diagnosis not present

## 2015-10-21 DIAGNOSIS — E039 Hypothyroidism, unspecified: Secondary | ICD-10-CM | POA: Diagnosis not present

## 2015-10-22 ENCOUNTER — Ambulatory Visit (INDEPENDENT_AMBULATORY_CARE_PROVIDER_SITE_OTHER): Payer: Medicare Other | Admitting: Pharmacist Clinician (PhC)/ Clinical Pharmacy Specialist

## 2015-10-22 DIAGNOSIS — E119 Type 2 diabetes mellitus without complications: Secondary | ICD-10-CM | POA: Diagnosis not present

## 2015-10-22 DIAGNOSIS — Z950 Presence of cardiac pacemaker: Secondary | ICD-10-CM | POA: Diagnosis not present

## 2015-10-22 DIAGNOSIS — Z794 Long term (current) use of insulin: Secondary | ICD-10-CM | POA: Diagnosis not present

## 2015-10-22 DIAGNOSIS — S72351D Displaced comminuted fracture of shaft of right femur, subsequent encounter for closed fracture with routine healing: Secondary | ICD-10-CM | POA: Diagnosis not present

## 2015-10-22 DIAGNOSIS — I482 Chronic atrial fibrillation: Secondary | ICD-10-CM | POA: Diagnosis not present

## 2015-10-22 DIAGNOSIS — G8929 Other chronic pain: Secondary | ICD-10-CM | POA: Diagnosis not present

## 2015-10-22 DIAGNOSIS — I129 Hypertensive chronic kidney disease with stage 1 through stage 4 chronic kidney disease, or unspecified chronic kidney disease: Secondary | ICD-10-CM | POA: Diagnosis not present

## 2015-10-22 DIAGNOSIS — E785 Hyperlipidemia, unspecified: Secondary | ICD-10-CM | POA: Diagnosis not present

## 2015-10-22 DIAGNOSIS — E039 Hypothyroidism, unspecified: Secondary | ICD-10-CM | POA: Diagnosis not present

## 2015-10-22 DIAGNOSIS — M6281 Muscle weakness (generalized): Secondary | ICD-10-CM | POA: Diagnosis not present

## 2015-10-22 DIAGNOSIS — N183 Chronic kidney disease, stage 3 (moderate): Secondary | ICD-10-CM | POA: Diagnosis not present

## 2015-10-22 DIAGNOSIS — Z7901 Long term (current) use of anticoagulants: Secondary | ICD-10-CM

## 2015-10-22 DIAGNOSIS — K219 Gastro-esophageal reflux disease without esophagitis: Secondary | ICD-10-CM | POA: Diagnosis not present

## 2015-10-22 DIAGNOSIS — Z9181 History of falling: Secondary | ICD-10-CM | POA: Diagnosis not present

## 2015-10-22 LAB — POCT INR: INR: 2.2

## 2015-10-24 DIAGNOSIS — E039 Hypothyroidism, unspecified: Secondary | ICD-10-CM | POA: Diagnosis not present

## 2015-10-24 DIAGNOSIS — E785 Hyperlipidemia, unspecified: Secondary | ICD-10-CM | POA: Diagnosis not present

## 2015-10-24 DIAGNOSIS — M6281 Muscle weakness (generalized): Secondary | ICD-10-CM | POA: Diagnosis not present

## 2015-10-24 DIAGNOSIS — Z950 Presence of cardiac pacemaker: Secondary | ICD-10-CM | POA: Diagnosis not present

## 2015-10-24 DIAGNOSIS — K219 Gastro-esophageal reflux disease without esophagitis: Secondary | ICD-10-CM | POA: Diagnosis not present

## 2015-10-24 DIAGNOSIS — Z794 Long term (current) use of insulin: Secondary | ICD-10-CM | POA: Diagnosis not present

## 2015-10-24 DIAGNOSIS — Z9181 History of falling: Secondary | ICD-10-CM | POA: Diagnosis not present

## 2015-10-24 DIAGNOSIS — G8929 Other chronic pain: Secondary | ICD-10-CM | POA: Diagnosis not present

## 2015-10-24 DIAGNOSIS — E119 Type 2 diabetes mellitus without complications: Secondary | ICD-10-CM | POA: Diagnosis not present

## 2015-10-24 DIAGNOSIS — I482 Chronic atrial fibrillation: Secondary | ICD-10-CM | POA: Diagnosis not present

## 2015-10-24 DIAGNOSIS — I129 Hypertensive chronic kidney disease with stage 1 through stage 4 chronic kidney disease, or unspecified chronic kidney disease: Secondary | ICD-10-CM | POA: Diagnosis not present

## 2015-10-24 DIAGNOSIS — S72351D Displaced comminuted fracture of shaft of right femur, subsequent encounter for closed fracture with routine healing: Secondary | ICD-10-CM | POA: Diagnosis not present

## 2015-10-24 DIAGNOSIS — N183 Chronic kidney disease, stage 3 (moderate): Secondary | ICD-10-CM | POA: Diagnosis not present

## 2015-10-28 DIAGNOSIS — Z950 Presence of cardiac pacemaker: Secondary | ICD-10-CM | POA: Diagnosis not present

## 2015-10-28 DIAGNOSIS — I482 Chronic atrial fibrillation: Secondary | ICD-10-CM | POA: Diagnosis not present

## 2015-10-28 DIAGNOSIS — S72351D Displaced comminuted fracture of shaft of right femur, subsequent encounter for closed fracture with routine healing: Secondary | ICD-10-CM | POA: Diagnosis not present

## 2015-10-28 DIAGNOSIS — E785 Hyperlipidemia, unspecified: Secondary | ICD-10-CM | POA: Diagnosis not present

## 2015-10-28 DIAGNOSIS — Z794 Long term (current) use of insulin: Secondary | ICD-10-CM | POA: Diagnosis not present

## 2015-10-28 DIAGNOSIS — K219 Gastro-esophageal reflux disease without esophagitis: Secondary | ICD-10-CM | POA: Diagnosis not present

## 2015-10-28 DIAGNOSIS — E039 Hypothyroidism, unspecified: Secondary | ICD-10-CM | POA: Diagnosis not present

## 2015-10-28 DIAGNOSIS — I129 Hypertensive chronic kidney disease with stage 1 through stage 4 chronic kidney disease, or unspecified chronic kidney disease: Secondary | ICD-10-CM | POA: Diagnosis not present

## 2015-10-28 DIAGNOSIS — E119 Type 2 diabetes mellitus without complications: Secondary | ICD-10-CM | POA: Diagnosis not present

## 2015-10-28 DIAGNOSIS — G8929 Other chronic pain: Secondary | ICD-10-CM | POA: Diagnosis not present

## 2015-10-28 DIAGNOSIS — M6281 Muscle weakness (generalized): Secondary | ICD-10-CM | POA: Diagnosis not present

## 2015-10-28 DIAGNOSIS — Z9181 History of falling: Secondary | ICD-10-CM | POA: Diagnosis not present

## 2015-10-28 DIAGNOSIS — N183 Chronic kidney disease, stage 3 (moderate): Secondary | ICD-10-CM | POA: Diagnosis not present

## 2015-10-29 DIAGNOSIS — S72401A Unspecified fracture of lower end of right femur, initial encounter for closed fracture: Secondary | ICD-10-CM | POA: Diagnosis not present

## 2015-10-30 ENCOUNTER — Other Ambulatory Visit: Payer: Self-pay | Admitting: *Deleted

## 2015-10-30 DIAGNOSIS — I129 Hypertensive chronic kidney disease with stage 1 through stage 4 chronic kidney disease, or unspecified chronic kidney disease: Secondary | ICD-10-CM | POA: Diagnosis not present

## 2015-10-30 DIAGNOSIS — Z794 Long term (current) use of insulin: Secondary | ICD-10-CM | POA: Diagnosis not present

## 2015-10-30 DIAGNOSIS — E039 Hypothyroidism, unspecified: Secondary | ICD-10-CM | POA: Diagnosis not present

## 2015-10-30 DIAGNOSIS — I482 Chronic atrial fibrillation: Secondary | ICD-10-CM | POA: Diagnosis not present

## 2015-10-30 DIAGNOSIS — Z9181 History of falling: Secondary | ICD-10-CM | POA: Diagnosis not present

## 2015-10-30 DIAGNOSIS — E785 Hyperlipidemia, unspecified: Secondary | ICD-10-CM | POA: Diagnosis not present

## 2015-10-30 DIAGNOSIS — E119 Type 2 diabetes mellitus without complications: Secondary | ICD-10-CM | POA: Diagnosis not present

## 2015-10-30 DIAGNOSIS — G8929 Other chronic pain: Secondary | ICD-10-CM | POA: Diagnosis not present

## 2015-10-30 DIAGNOSIS — N183 Chronic kidney disease, stage 3 (moderate): Secondary | ICD-10-CM | POA: Diagnosis not present

## 2015-10-30 DIAGNOSIS — S72351D Displaced comminuted fracture of shaft of right femur, subsequent encounter for closed fracture with routine healing: Secondary | ICD-10-CM | POA: Diagnosis not present

## 2015-10-30 DIAGNOSIS — K219 Gastro-esophageal reflux disease without esophagitis: Secondary | ICD-10-CM | POA: Diagnosis not present

## 2015-10-30 DIAGNOSIS — M6281 Muscle weakness (generalized): Secondary | ICD-10-CM | POA: Diagnosis not present

## 2015-10-30 DIAGNOSIS — Z950 Presence of cardiac pacemaker: Secondary | ICD-10-CM | POA: Diagnosis not present

## 2015-10-30 MED ORDER — METOPROLOL TARTRATE 25 MG PO TABS: 25.0000 mg | ORAL_TABLET | Freq: Two times a day (BID) | ORAL | Status: DC

## 2015-10-31 DIAGNOSIS — M6281 Muscle weakness (generalized): Secondary | ICD-10-CM | POA: Diagnosis not present

## 2015-10-31 DIAGNOSIS — S72351D Displaced comminuted fracture of shaft of right femur, subsequent encounter for closed fracture with routine healing: Secondary | ICD-10-CM | POA: Diagnosis not present

## 2015-10-31 DIAGNOSIS — K219 Gastro-esophageal reflux disease without esophagitis: Secondary | ICD-10-CM | POA: Diagnosis not present

## 2015-10-31 DIAGNOSIS — G8929 Other chronic pain: Secondary | ICD-10-CM | POA: Diagnosis not present

## 2015-10-31 DIAGNOSIS — I482 Chronic atrial fibrillation: Secondary | ICD-10-CM | POA: Diagnosis not present

## 2015-10-31 DIAGNOSIS — E039 Hypothyroidism, unspecified: Secondary | ICD-10-CM | POA: Diagnosis not present

## 2015-10-31 DIAGNOSIS — Z950 Presence of cardiac pacemaker: Secondary | ICD-10-CM | POA: Diagnosis not present

## 2015-10-31 DIAGNOSIS — N183 Chronic kidney disease, stage 3 (moderate): Secondary | ICD-10-CM | POA: Diagnosis not present

## 2015-10-31 DIAGNOSIS — E785 Hyperlipidemia, unspecified: Secondary | ICD-10-CM | POA: Diagnosis not present

## 2015-10-31 DIAGNOSIS — I129 Hypertensive chronic kidney disease with stage 1 through stage 4 chronic kidney disease, or unspecified chronic kidney disease: Secondary | ICD-10-CM | POA: Diagnosis not present

## 2015-10-31 DIAGNOSIS — Z794 Long term (current) use of insulin: Secondary | ICD-10-CM | POA: Diagnosis not present

## 2015-10-31 DIAGNOSIS — Z9181 History of falling: Secondary | ICD-10-CM | POA: Diagnosis not present

## 2015-10-31 DIAGNOSIS — E119 Type 2 diabetes mellitus without complications: Secondary | ICD-10-CM | POA: Diagnosis not present

## 2015-11-03 ENCOUNTER — Ambulatory Visit (INDEPENDENT_AMBULATORY_CARE_PROVIDER_SITE_OTHER): Payer: Medicare Other | Admitting: *Deleted

## 2015-11-03 ENCOUNTER — Telehealth: Payer: Self-pay | Admitting: Cardiology

## 2015-11-03 DIAGNOSIS — S72351D Displaced comminuted fracture of shaft of right femur, subsequent encounter for closed fracture with routine healing: Secondary | ICD-10-CM | POA: Diagnosis not present

## 2015-11-03 DIAGNOSIS — E039 Hypothyroidism, unspecified: Secondary | ICD-10-CM | POA: Diagnosis not present

## 2015-11-03 DIAGNOSIS — N183 Chronic kidney disease, stage 3 (moderate): Secondary | ICD-10-CM | POA: Diagnosis not present

## 2015-11-03 DIAGNOSIS — E785 Hyperlipidemia, unspecified: Secondary | ICD-10-CM | POA: Diagnosis not present

## 2015-11-03 DIAGNOSIS — Z9181 History of falling: Secondary | ICD-10-CM | POA: Diagnosis not present

## 2015-11-03 DIAGNOSIS — M6281 Muscle weakness (generalized): Secondary | ICD-10-CM | POA: Diagnosis not present

## 2015-11-03 DIAGNOSIS — Z794 Long term (current) use of insulin: Secondary | ICD-10-CM | POA: Diagnosis not present

## 2015-11-03 DIAGNOSIS — K219 Gastro-esophageal reflux disease without esophagitis: Secondary | ICD-10-CM | POA: Diagnosis not present

## 2015-11-03 DIAGNOSIS — G8929 Other chronic pain: Secondary | ICD-10-CM | POA: Diagnosis not present

## 2015-11-03 DIAGNOSIS — I129 Hypertensive chronic kidney disease with stage 1 through stage 4 chronic kidney disease, or unspecified chronic kidney disease: Secondary | ICD-10-CM | POA: Diagnosis not present

## 2015-11-03 DIAGNOSIS — I482 Chronic atrial fibrillation: Secondary | ICD-10-CM | POA: Diagnosis not present

## 2015-11-03 DIAGNOSIS — I442 Atrioventricular block, complete: Secondary | ICD-10-CM | POA: Diagnosis not present

## 2015-11-03 DIAGNOSIS — Z950 Presence of cardiac pacemaker: Secondary | ICD-10-CM | POA: Diagnosis not present

## 2015-11-03 DIAGNOSIS — E119 Type 2 diabetes mellitus without complications: Secondary | ICD-10-CM | POA: Diagnosis not present

## 2015-11-03 NOTE — Telephone Encounter (Signed)
Spoke with pt and reminded pt of remote transmission that is due today. Pt verbalized understanding.   

## 2015-11-04 ENCOUNTER — Encounter: Payer: Self-pay | Admitting: Cardiology

## 2015-11-04 DIAGNOSIS — Z9181 History of falling: Secondary | ICD-10-CM | POA: Diagnosis not present

## 2015-11-04 DIAGNOSIS — E785 Hyperlipidemia, unspecified: Secondary | ICD-10-CM | POA: Diagnosis not present

## 2015-11-04 DIAGNOSIS — E039 Hypothyroidism, unspecified: Secondary | ICD-10-CM | POA: Diagnosis not present

## 2015-11-04 DIAGNOSIS — S72351D Displaced comminuted fracture of shaft of right femur, subsequent encounter for closed fracture with routine healing: Secondary | ICD-10-CM | POA: Diagnosis not present

## 2015-11-04 DIAGNOSIS — N183 Chronic kidney disease, stage 3 (moderate): Secondary | ICD-10-CM | POA: Diagnosis not present

## 2015-11-04 DIAGNOSIS — K219 Gastro-esophageal reflux disease without esophagitis: Secondary | ICD-10-CM | POA: Diagnosis not present

## 2015-11-04 DIAGNOSIS — I482 Chronic atrial fibrillation: Secondary | ICD-10-CM | POA: Diagnosis not present

## 2015-11-04 DIAGNOSIS — I129 Hypertensive chronic kidney disease with stage 1 through stage 4 chronic kidney disease, or unspecified chronic kidney disease: Secondary | ICD-10-CM | POA: Diagnosis not present

## 2015-11-04 DIAGNOSIS — M6281 Muscle weakness (generalized): Secondary | ICD-10-CM | POA: Diagnosis not present

## 2015-11-04 DIAGNOSIS — E119 Type 2 diabetes mellitus without complications: Secondary | ICD-10-CM | POA: Diagnosis not present

## 2015-11-04 DIAGNOSIS — Z794 Long term (current) use of insulin: Secondary | ICD-10-CM | POA: Diagnosis not present

## 2015-11-04 DIAGNOSIS — G8929 Other chronic pain: Secondary | ICD-10-CM | POA: Diagnosis not present

## 2015-11-04 DIAGNOSIS — Z950 Presence of cardiac pacemaker: Secondary | ICD-10-CM | POA: Diagnosis not present

## 2015-11-05 ENCOUNTER — Ambulatory Visit: Payer: Medicare Other | Admitting: Family Medicine

## 2015-11-05 ENCOUNTER — Ambulatory Visit (INDEPENDENT_AMBULATORY_CARE_PROVIDER_SITE_OTHER): Payer: Medicare Other | Admitting: Pharmacist Clinician (PhC)/ Clinical Pharmacy Specialist

## 2015-11-05 DIAGNOSIS — G8929 Other chronic pain: Secondary | ICD-10-CM | POA: Diagnosis not present

## 2015-11-05 DIAGNOSIS — Z7901 Long term (current) use of anticoagulants: Secondary | ICD-10-CM

## 2015-11-05 DIAGNOSIS — Z950 Presence of cardiac pacemaker: Secondary | ICD-10-CM | POA: Diagnosis not present

## 2015-11-05 DIAGNOSIS — M6281 Muscle weakness (generalized): Secondary | ICD-10-CM | POA: Diagnosis not present

## 2015-11-05 DIAGNOSIS — E785 Hyperlipidemia, unspecified: Secondary | ICD-10-CM | POA: Diagnosis not present

## 2015-11-05 DIAGNOSIS — Z9181 History of falling: Secondary | ICD-10-CM | POA: Diagnosis not present

## 2015-11-05 DIAGNOSIS — Z794 Long term (current) use of insulin: Secondary | ICD-10-CM | POA: Diagnosis not present

## 2015-11-05 DIAGNOSIS — I482 Chronic atrial fibrillation: Secondary | ICD-10-CM | POA: Diagnosis not present

## 2015-11-05 DIAGNOSIS — E119 Type 2 diabetes mellitus without complications: Secondary | ICD-10-CM | POA: Diagnosis not present

## 2015-11-05 DIAGNOSIS — I129 Hypertensive chronic kidney disease with stage 1 through stage 4 chronic kidney disease, or unspecified chronic kidney disease: Secondary | ICD-10-CM | POA: Diagnosis not present

## 2015-11-05 DIAGNOSIS — S72351D Displaced comminuted fracture of shaft of right femur, subsequent encounter for closed fracture with routine healing: Secondary | ICD-10-CM | POA: Diagnosis not present

## 2015-11-05 DIAGNOSIS — E039 Hypothyroidism, unspecified: Secondary | ICD-10-CM | POA: Diagnosis not present

## 2015-11-05 DIAGNOSIS — N183 Chronic kidney disease, stage 3 (moderate): Secondary | ICD-10-CM | POA: Diagnosis not present

## 2015-11-05 DIAGNOSIS — K219 Gastro-esophageal reflux disease without esophagitis: Secondary | ICD-10-CM | POA: Diagnosis not present

## 2015-11-05 LAB — POCT INR: INR: 1

## 2015-11-06 ENCOUNTER — Ambulatory Visit: Payer: Medicare Other | Admitting: Family Medicine

## 2015-11-06 DIAGNOSIS — K219 Gastro-esophageal reflux disease without esophagitis: Secondary | ICD-10-CM | POA: Diagnosis not present

## 2015-11-06 DIAGNOSIS — E785 Hyperlipidemia, unspecified: Secondary | ICD-10-CM | POA: Diagnosis not present

## 2015-11-06 DIAGNOSIS — Z794 Long term (current) use of insulin: Secondary | ICD-10-CM | POA: Diagnosis not present

## 2015-11-06 DIAGNOSIS — S72351D Displaced comminuted fracture of shaft of right femur, subsequent encounter for closed fracture with routine healing: Secondary | ICD-10-CM | POA: Diagnosis not present

## 2015-11-06 DIAGNOSIS — I129 Hypertensive chronic kidney disease with stage 1 through stage 4 chronic kidney disease, or unspecified chronic kidney disease: Secondary | ICD-10-CM | POA: Diagnosis not present

## 2015-11-06 DIAGNOSIS — G8929 Other chronic pain: Secondary | ICD-10-CM | POA: Diagnosis not present

## 2015-11-06 DIAGNOSIS — M6281 Muscle weakness (generalized): Secondary | ICD-10-CM | POA: Diagnosis not present

## 2015-11-06 DIAGNOSIS — Z950 Presence of cardiac pacemaker: Secondary | ICD-10-CM | POA: Diagnosis not present

## 2015-11-06 DIAGNOSIS — E039 Hypothyroidism, unspecified: Secondary | ICD-10-CM | POA: Diagnosis not present

## 2015-11-06 DIAGNOSIS — Z9181 History of falling: Secondary | ICD-10-CM | POA: Diagnosis not present

## 2015-11-06 DIAGNOSIS — E119 Type 2 diabetes mellitus without complications: Secondary | ICD-10-CM | POA: Diagnosis not present

## 2015-11-06 DIAGNOSIS — N183 Chronic kidney disease, stage 3 (moderate): Secondary | ICD-10-CM | POA: Diagnosis not present

## 2015-11-06 DIAGNOSIS — I482 Chronic atrial fibrillation: Secondary | ICD-10-CM | POA: Diagnosis not present

## 2015-11-06 LAB — CUP PACEART REMOTE DEVICE CHECK
Battery Remaining Longevity: 107 mo
Brady Statistic AP VP Percent: 17 %
Date Time Interrogation Session: 20161109154357
Implantable Lead Location: 753859
Implantable Lead Location: 753860
Lead Channel Impedance Value: 581 Ohm
Lead Channel Pacing Threshold Amplitude: 0.625 V
Lead Channel Sensing Intrinsic Amplitude: 2.8 mV
Lead Channel Setting Pacing Amplitude: 1.5 V
Lead Channel Setting Pacing Pulse Width: 0.4 ms
MDC IDC LEAD IMPLANT DT: 20110208
MDC IDC LEAD IMPLANT DT: 20110208
MDC IDC MSMT BATTERY IMPEDANCE: 373 Ohm
MDC IDC MSMT BATTERY VOLTAGE: 2.79 V
MDC IDC MSMT LEADCHNL RA PACING THRESHOLD AMPLITUDE: 0.625 V
MDC IDC MSMT LEADCHNL RA PACING THRESHOLD PULSEWIDTH: 0.4 ms
MDC IDC MSMT LEADCHNL RV IMPEDANCE VALUE: 604 Ohm
MDC IDC MSMT LEADCHNL RV PACING THRESHOLD PULSEWIDTH: 0.4 ms
MDC IDC SET LEADCHNL RV PACING AMPLITUDE: 2 V
MDC IDC SET LEADCHNL RV SENSING SENSITIVITY: 2.8 mV
MDC IDC STAT BRADY AP VS PERCENT: 0 %
MDC IDC STAT BRADY AS VP PERCENT: 83 %
MDC IDC STAT BRADY AS VS PERCENT: 0 %

## 2015-11-06 NOTE — Progress Notes (Signed)
Remote pacemaker transmission.   

## 2015-11-07 ENCOUNTER — Encounter: Payer: Self-pay | Admitting: Cardiology

## 2015-11-07 DIAGNOSIS — M6281 Muscle weakness (generalized): Secondary | ICD-10-CM | POA: Diagnosis not present

## 2015-11-07 DIAGNOSIS — I129 Hypertensive chronic kidney disease with stage 1 through stage 4 chronic kidney disease, or unspecified chronic kidney disease: Secondary | ICD-10-CM | POA: Diagnosis not present

## 2015-11-07 DIAGNOSIS — Z9181 History of falling: Secondary | ICD-10-CM | POA: Diagnosis not present

## 2015-11-07 DIAGNOSIS — N183 Chronic kidney disease, stage 3 (moderate): Secondary | ICD-10-CM | POA: Diagnosis not present

## 2015-11-07 DIAGNOSIS — Z794 Long term (current) use of insulin: Secondary | ICD-10-CM | POA: Diagnosis not present

## 2015-11-07 DIAGNOSIS — E119 Type 2 diabetes mellitus without complications: Secondary | ICD-10-CM | POA: Diagnosis not present

## 2015-11-07 DIAGNOSIS — K219 Gastro-esophageal reflux disease without esophagitis: Secondary | ICD-10-CM | POA: Diagnosis not present

## 2015-11-07 DIAGNOSIS — S72351D Displaced comminuted fracture of shaft of right femur, subsequent encounter for closed fracture with routine healing: Secondary | ICD-10-CM | POA: Diagnosis not present

## 2015-11-07 DIAGNOSIS — I482 Chronic atrial fibrillation: Secondary | ICD-10-CM | POA: Diagnosis not present

## 2015-11-07 DIAGNOSIS — E785 Hyperlipidemia, unspecified: Secondary | ICD-10-CM | POA: Diagnosis not present

## 2015-11-07 DIAGNOSIS — Z950 Presence of cardiac pacemaker: Secondary | ICD-10-CM | POA: Diagnosis not present

## 2015-11-07 DIAGNOSIS — G8929 Other chronic pain: Secondary | ICD-10-CM | POA: Diagnosis not present

## 2015-11-07 DIAGNOSIS — E039 Hypothyroidism, unspecified: Secondary | ICD-10-CM | POA: Diagnosis not present

## 2015-11-10 DIAGNOSIS — E119 Type 2 diabetes mellitus without complications: Secondary | ICD-10-CM | POA: Diagnosis not present

## 2015-11-10 DIAGNOSIS — N183 Chronic kidney disease, stage 3 (moderate): Secondary | ICD-10-CM | POA: Diagnosis not present

## 2015-11-10 DIAGNOSIS — E785 Hyperlipidemia, unspecified: Secondary | ICD-10-CM | POA: Diagnosis not present

## 2015-11-10 DIAGNOSIS — G8929 Other chronic pain: Secondary | ICD-10-CM | POA: Diagnosis not present

## 2015-11-10 DIAGNOSIS — Z794 Long term (current) use of insulin: Secondary | ICD-10-CM | POA: Diagnosis not present

## 2015-11-10 DIAGNOSIS — M6281 Muscle weakness (generalized): Secondary | ICD-10-CM | POA: Diagnosis not present

## 2015-11-10 DIAGNOSIS — Z9181 History of falling: Secondary | ICD-10-CM | POA: Diagnosis not present

## 2015-11-10 DIAGNOSIS — E039 Hypothyroidism, unspecified: Secondary | ICD-10-CM | POA: Diagnosis not present

## 2015-11-10 DIAGNOSIS — K219 Gastro-esophageal reflux disease without esophagitis: Secondary | ICD-10-CM | POA: Diagnosis not present

## 2015-11-10 DIAGNOSIS — S72351D Displaced comminuted fracture of shaft of right femur, subsequent encounter for closed fracture with routine healing: Secondary | ICD-10-CM | POA: Diagnosis not present

## 2015-11-10 DIAGNOSIS — Z950 Presence of cardiac pacemaker: Secondary | ICD-10-CM | POA: Diagnosis not present

## 2015-11-10 DIAGNOSIS — I482 Chronic atrial fibrillation: Secondary | ICD-10-CM | POA: Diagnosis not present

## 2015-11-10 DIAGNOSIS — I129 Hypertensive chronic kidney disease with stage 1 through stage 4 chronic kidney disease, or unspecified chronic kidney disease: Secondary | ICD-10-CM | POA: Diagnosis not present

## 2015-11-11 DIAGNOSIS — E119 Type 2 diabetes mellitus without complications: Secondary | ICD-10-CM | POA: Diagnosis not present

## 2015-11-11 DIAGNOSIS — Z9181 History of falling: Secondary | ICD-10-CM | POA: Diagnosis not present

## 2015-11-11 DIAGNOSIS — E039 Hypothyroidism, unspecified: Secondary | ICD-10-CM | POA: Diagnosis not present

## 2015-11-11 DIAGNOSIS — N183 Chronic kidney disease, stage 3 (moderate): Secondary | ICD-10-CM | POA: Diagnosis not present

## 2015-11-11 DIAGNOSIS — E785 Hyperlipidemia, unspecified: Secondary | ICD-10-CM | POA: Diagnosis not present

## 2015-11-11 DIAGNOSIS — G8929 Other chronic pain: Secondary | ICD-10-CM | POA: Diagnosis not present

## 2015-11-11 DIAGNOSIS — K219 Gastro-esophageal reflux disease without esophagitis: Secondary | ICD-10-CM | POA: Diagnosis not present

## 2015-11-11 DIAGNOSIS — Z794 Long term (current) use of insulin: Secondary | ICD-10-CM | POA: Diagnosis not present

## 2015-11-11 DIAGNOSIS — I129 Hypertensive chronic kidney disease with stage 1 through stage 4 chronic kidney disease, or unspecified chronic kidney disease: Secondary | ICD-10-CM | POA: Diagnosis not present

## 2015-11-11 DIAGNOSIS — I482 Chronic atrial fibrillation: Secondary | ICD-10-CM | POA: Diagnosis not present

## 2015-11-11 DIAGNOSIS — S72351D Displaced comminuted fracture of shaft of right femur, subsequent encounter for closed fracture with routine healing: Secondary | ICD-10-CM | POA: Diagnosis not present

## 2015-11-11 DIAGNOSIS — Z950 Presence of cardiac pacemaker: Secondary | ICD-10-CM | POA: Diagnosis not present

## 2015-11-11 DIAGNOSIS — M6281 Muscle weakness (generalized): Secondary | ICD-10-CM | POA: Diagnosis not present

## 2015-11-12 ENCOUNTER — Ambulatory Visit (INDEPENDENT_AMBULATORY_CARE_PROVIDER_SITE_OTHER): Payer: Medicare Other | Admitting: Pharmacist Clinician (PhC)/ Clinical Pharmacy Specialist

## 2015-11-12 DIAGNOSIS — Z950 Presence of cardiac pacemaker: Secondary | ICD-10-CM | POA: Diagnosis not present

## 2015-11-12 DIAGNOSIS — G8929 Other chronic pain: Secondary | ICD-10-CM | POA: Diagnosis not present

## 2015-11-12 DIAGNOSIS — Z9181 History of falling: Secondary | ICD-10-CM | POA: Diagnosis not present

## 2015-11-12 DIAGNOSIS — E039 Hypothyroidism, unspecified: Secondary | ICD-10-CM | POA: Diagnosis not present

## 2015-11-12 DIAGNOSIS — Z7901 Long term (current) use of anticoagulants: Secondary | ICD-10-CM

## 2015-11-12 DIAGNOSIS — I482 Chronic atrial fibrillation: Secondary | ICD-10-CM | POA: Diagnosis not present

## 2015-11-12 DIAGNOSIS — E785 Hyperlipidemia, unspecified: Secondary | ICD-10-CM | POA: Diagnosis not present

## 2015-11-12 DIAGNOSIS — I129 Hypertensive chronic kidney disease with stage 1 through stage 4 chronic kidney disease, or unspecified chronic kidney disease: Secondary | ICD-10-CM | POA: Diagnosis not present

## 2015-11-12 DIAGNOSIS — K219 Gastro-esophageal reflux disease without esophagitis: Secondary | ICD-10-CM | POA: Diagnosis not present

## 2015-11-12 DIAGNOSIS — S72351D Displaced comminuted fracture of shaft of right femur, subsequent encounter for closed fracture with routine healing: Secondary | ICD-10-CM | POA: Diagnosis not present

## 2015-11-12 DIAGNOSIS — E119 Type 2 diabetes mellitus without complications: Secondary | ICD-10-CM | POA: Diagnosis not present

## 2015-11-12 DIAGNOSIS — Z794 Long term (current) use of insulin: Secondary | ICD-10-CM | POA: Diagnosis not present

## 2015-11-12 DIAGNOSIS — N183 Chronic kidney disease, stage 3 (moderate): Secondary | ICD-10-CM | POA: Diagnosis not present

## 2015-11-12 DIAGNOSIS — M6281 Muscle weakness (generalized): Secondary | ICD-10-CM | POA: Diagnosis not present

## 2015-11-12 LAB — POCT INR: INR: 2.6

## 2015-11-13 ENCOUNTER — Telehealth: Payer: Self-pay | Admitting: *Deleted

## 2015-11-13 DIAGNOSIS — N390 Urinary tract infection, site not specified: Secondary | ICD-10-CM

## 2015-11-13 DIAGNOSIS — Z794 Long term (current) use of insulin: Secondary | ICD-10-CM | POA: Diagnosis not present

## 2015-11-13 DIAGNOSIS — I129 Hypertensive chronic kidney disease with stage 1 through stage 4 chronic kidney disease, or unspecified chronic kidney disease: Secondary | ICD-10-CM | POA: Diagnosis not present

## 2015-11-13 DIAGNOSIS — I482 Chronic atrial fibrillation: Secondary | ICD-10-CM | POA: Diagnosis not present

## 2015-11-13 DIAGNOSIS — N183 Chronic kidney disease, stage 3 (moderate): Secondary | ICD-10-CM | POA: Diagnosis not present

## 2015-11-13 DIAGNOSIS — E039 Hypothyroidism, unspecified: Secondary | ICD-10-CM | POA: Diagnosis not present

## 2015-11-13 DIAGNOSIS — G8929 Other chronic pain: Secondary | ICD-10-CM | POA: Diagnosis not present

## 2015-11-13 DIAGNOSIS — Z950 Presence of cardiac pacemaker: Secondary | ICD-10-CM | POA: Diagnosis not present

## 2015-11-13 DIAGNOSIS — S72351D Displaced comminuted fracture of shaft of right femur, subsequent encounter for closed fracture with routine healing: Secondary | ICD-10-CM | POA: Diagnosis not present

## 2015-11-13 DIAGNOSIS — E119 Type 2 diabetes mellitus without complications: Secondary | ICD-10-CM | POA: Diagnosis not present

## 2015-11-13 DIAGNOSIS — M6281 Muscle weakness (generalized): Secondary | ICD-10-CM | POA: Diagnosis not present

## 2015-11-13 DIAGNOSIS — K219 Gastro-esophageal reflux disease without esophagitis: Secondary | ICD-10-CM | POA: Diagnosis not present

## 2015-11-13 DIAGNOSIS — E785 Hyperlipidemia, unspecified: Secondary | ICD-10-CM | POA: Diagnosis not present

## 2015-11-13 DIAGNOSIS — Z9181 History of falling: Secondary | ICD-10-CM | POA: Diagnosis not present

## 2015-11-13 NOTE — Telephone Encounter (Signed)
[name] w/[facility] called and stated that pt is experiencing UTI sxs and would like to get an order faxed for her to have a UA and Ucx done. She would like to have this faxed to [phone]

## 2015-11-14 DIAGNOSIS — G8929 Other chronic pain: Secondary | ICD-10-CM | POA: Diagnosis not present

## 2015-11-14 DIAGNOSIS — E785 Hyperlipidemia, unspecified: Secondary | ICD-10-CM | POA: Diagnosis not present

## 2015-11-14 DIAGNOSIS — K219 Gastro-esophageal reflux disease without esophagitis: Secondary | ICD-10-CM | POA: Diagnosis not present

## 2015-11-14 DIAGNOSIS — Z9181 History of falling: Secondary | ICD-10-CM | POA: Diagnosis not present

## 2015-11-14 DIAGNOSIS — S72351D Displaced comminuted fracture of shaft of right femur, subsequent encounter for closed fracture with routine healing: Secondary | ICD-10-CM | POA: Diagnosis not present

## 2015-11-14 DIAGNOSIS — Z794 Long term (current) use of insulin: Secondary | ICD-10-CM | POA: Diagnosis not present

## 2015-11-14 DIAGNOSIS — Z950 Presence of cardiac pacemaker: Secondary | ICD-10-CM | POA: Diagnosis not present

## 2015-11-14 DIAGNOSIS — E119 Type 2 diabetes mellitus without complications: Secondary | ICD-10-CM | POA: Diagnosis not present

## 2015-11-14 DIAGNOSIS — I482 Chronic atrial fibrillation: Secondary | ICD-10-CM | POA: Diagnosis not present

## 2015-11-14 DIAGNOSIS — M6281 Muscle weakness (generalized): Secondary | ICD-10-CM | POA: Diagnosis not present

## 2015-11-14 DIAGNOSIS — E039 Hypothyroidism, unspecified: Secondary | ICD-10-CM | POA: Diagnosis not present

## 2015-11-14 DIAGNOSIS — I129 Hypertensive chronic kidney disease with stage 1 through stage 4 chronic kidney disease, or unspecified chronic kidney disease: Secondary | ICD-10-CM | POA: Diagnosis not present

## 2015-11-14 DIAGNOSIS — N183 Chronic kidney disease, stage 3 (moderate): Secondary | ICD-10-CM | POA: Diagnosis not present

## 2015-11-15 DIAGNOSIS — S72351D Displaced comminuted fracture of shaft of right femur, subsequent encounter for closed fracture with routine healing: Secondary | ICD-10-CM | POA: Diagnosis not present

## 2015-11-17 ENCOUNTER — Telehealth: Payer: Self-pay | Admitting: Family Medicine

## 2015-11-17 DIAGNOSIS — Z950 Presence of cardiac pacemaker: Secondary | ICD-10-CM | POA: Diagnosis not present

## 2015-11-17 DIAGNOSIS — Z794 Long term (current) use of insulin: Secondary | ICD-10-CM | POA: Diagnosis not present

## 2015-11-17 DIAGNOSIS — G8929 Other chronic pain: Secondary | ICD-10-CM | POA: Diagnosis not present

## 2015-11-17 DIAGNOSIS — I129 Hypertensive chronic kidney disease with stage 1 through stage 4 chronic kidney disease, or unspecified chronic kidney disease: Secondary | ICD-10-CM | POA: Diagnosis not present

## 2015-11-17 DIAGNOSIS — K219 Gastro-esophageal reflux disease without esophagitis: Secondary | ICD-10-CM | POA: Diagnosis not present

## 2015-11-17 DIAGNOSIS — S72351D Displaced comminuted fracture of shaft of right femur, subsequent encounter for closed fracture with routine healing: Secondary | ICD-10-CM | POA: Diagnosis not present

## 2015-11-17 DIAGNOSIS — N183 Chronic kidney disease, stage 3 (moderate): Secondary | ICD-10-CM | POA: Diagnosis not present

## 2015-11-17 DIAGNOSIS — E039 Hypothyroidism, unspecified: Secondary | ICD-10-CM | POA: Diagnosis not present

## 2015-11-17 DIAGNOSIS — Z9181 History of falling: Secondary | ICD-10-CM | POA: Diagnosis not present

## 2015-11-17 DIAGNOSIS — E785 Hyperlipidemia, unspecified: Secondary | ICD-10-CM | POA: Diagnosis not present

## 2015-11-17 DIAGNOSIS — E119 Type 2 diabetes mellitus without complications: Secondary | ICD-10-CM | POA: Diagnosis not present

## 2015-11-17 DIAGNOSIS — I482 Chronic atrial fibrillation: Secondary | ICD-10-CM | POA: Diagnosis not present

## 2015-11-17 DIAGNOSIS — M6281 Muscle weakness (generalized): Secondary | ICD-10-CM | POA: Diagnosis not present

## 2015-11-17 MED ORDER — CIPROFLOXACIN HCL 500 MG PO TABS: 500.0000 mg | ORAL_TABLET | Freq: Two times a day (BID) | ORAL | Status: AC

## 2015-11-17 NOTE — Telephone Encounter (Signed)
Call patient: Urine culture is positive for Klebsiella. I will send over an antibiotic called ciprofloxacin to help clear this up.

## 2015-11-17 NOTE — Telephone Encounter (Signed)
Patient notified of rx for UTI called to [facility]. pak

## 2015-11-18 DIAGNOSIS — E119 Type 2 diabetes mellitus without complications: Secondary | ICD-10-CM | POA: Diagnosis not present

## 2015-11-18 DIAGNOSIS — G8929 Other chronic pain: Secondary | ICD-10-CM | POA: Diagnosis not present

## 2015-11-18 DIAGNOSIS — N183 Chronic kidney disease, stage 3 (moderate): Secondary | ICD-10-CM | POA: Diagnosis not present

## 2015-11-18 DIAGNOSIS — Z950 Presence of cardiac pacemaker: Secondary | ICD-10-CM | POA: Diagnosis not present

## 2015-11-18 DIAGNOSIS — Z9181 History of falling: Secondary | ICD-10-CM | POA: Diagnosis not present

## 2015-11-18 DIAGNOSIS — E039 Hypothyroidism, unspecified: Secondary | ICD-10-CM | POA: Diagnosis not present

## 2015-11-18 DIAGNOSIS — I482 Chronic atrial fibrillation: Secondary | ICD-10-CM | POA: Diagnosis not present

## 2015-11-18 DIAGNOSIS — S72351D Displaced comminuted fracture of shaft of right femur, subsequent encounter for closed fracture with routine healing: Secondary | ICD-10-CM | POA: Diagnosis not present

## 2015-11-18 DIAGNOSIS — Z794 Long term (current) use of insulin: Secondary | ICD-10-CM | POA: Diagnosis not present

## 2015-11-18 DIAGNOSIS — M6281 Muscle weakness (generalized): Secondary | ICD-10-CM | POA: Diagnosis not present

## 2015-11-18 DIAGNOSIS — I129 Hypertensive chronic kidney disease with stage 1 through stage 4 chronic kidney disease, or unspecified chronic kidney disease: Secondary | ICD-10-CM | POA: Diagnosis not present

## 2015-11-18 DIAGNOSIS — K219 Gastro-esophageal reflux disease without esophagitis: Secondary | ICD-10-CM | POA: Diagnosis not present

## 2015-11-18 DIAGNOSIS — E785 Hyperlipidemia, unspecified: Secondary | ICD-10-CM | POA: Diagnosis not present

## 2015-11-19 ENCOUNTER — Ambulatory Visit (INDEPENDENT_AMBULATORY_CARE_PROVIDER_SITE_OTHER): Payer: Medicare Other | Admitting: Pharmacist Clinician (PhC)/ Clinical Pharmacy Specialist

## 2015-11-19 DIAGNOSIS — I482 Chronic atrial fibrillation: Secondary | ICD-10-CM | POA: Diagnosis not present

## 2015-11-19 DIAGNOSIS — E785 Hyperlipidemia, unspecified: Secondary | ICD-10-CM | POA: Diagnosis not present

## 2015-11-19 DIAGNOSIS — I129 Hypertensive chronic kidney disease with stage 1 through stage 4 chronic kidney disease, or unspecified chronic kidney disease: Secondary | ICD-10-CM | POA: Diagnosis not present

## 2015-11-19 DIAGNOSIS — Z9181 History of falling: Secondary | ICD-10-CM | POA: Diagnosis not present

## 2015-11-19 DIAGNOSIS — Z950 Presence of cardiac pacemaker: Secondary | ICD-10-CM | POA: Diagnosis not present

## 2015-11-19 DIAGNOSIS — M6281 Muscle weakness (generalized): Secondary | ICD-10-CM | POA: Diagnosis not present

## 2015-11-19 DIAGNOSIS — S72351D Displaced comminuted fracture of shaft of right femur, subsequent encounter for closed fracture with routine healing: Secondary | ICD-10-CM | POA: Diagnosis not present

## 2015-11-19 DIAGNOSIS — Z7901 Long term (current) use of anticoagulants: Secondary | ICD-10-CM

## 2015-11-19 DIAGNOSIS — N183 Chronic kidney disease, stage 3 (moderate): Secondary | ICD-10-CM | POA: Diagnosis not present

## 2015-11-19 DIAGNOSIS — E119 Type 2 diabetes mellitus without complications: Secondary | ICD-10-CM | POA: Diagnosis not present

## 2015-11-19 DIAGNOSIS — K219 Gastro-esophageal reflux disease without esophagitis: Secondary | ICD-10-CM | POA: Diagnosis not present

## 2015-11-19 DIAGNOSIS — G8929 Other chronic pain: Secondary | ICD-10-CM | POA: Diagnosis not present

## 2015-11-19 DIAGNOSIS — E039 Hypothyroidism, unspecified: Secondary | ICD-10-CM | POA: Diagnosis not present

## 2015-11-19 DIAGNOSIS — Z794 Long term (current) use of insulin: Secondary | ICD-10-CM | POA: Diagnosis not present

## 2015-11-19 LAB — POCT INR: INR: 3.2

## 2015-11-24 ENCOUNTER — Other Ambulatory Visit: Payer: Self-pay | Admitting: Family Medicine

## 2015-11-25 DIAGNOSIS — Z794 Long term (current) use of insulin: Secondary | ICD-10-CM | POA: Diagnosis not present

## 2015-11-25 DIAGNOSIS — E785 Hyperlipidemia, unspecified: Secondary | ICD-10-CM | POA: Diagnosis not present

## 2015-11-25 DIAGNOSIS — S72351D Displaced comminuted fracture of shaft of right femur, subsequent encounter for closed fracture with routine healing: Secondary | ICD-10-CM | POA: Diagnosis not present

## 2015-11-25 DIAGNOSIS — M6281 Muscle weakness (generalized): Secondary | ICD-10-CM | POA: Diagnosis not present

## 2015-11-25 DIAGNOSIS — Z9181 History of falling: Secondary | ICD-10-CM | POA: Diagnosis not present

## 2015-11-25 DIAGNOSIS — E039 Hypothyroidism, unspecified: Secondary | ICD-10-CM | POA: Diagnosis not present

## 2015-11-25 DIAGNOSIS — K219 Gastro-esophageal reflux disease without esophagitis: Secondary | ICD-10-CM | POA: Diagnosis not present

## 2015-11-25 DIAGNOSIS — E119 Type 2 diabetes mellitus without complications: Secondary | ICD-10-CM | POA: Diagnosis not present

## 2015-11-25 DIAGNOSIS — I129 Hypertensive chronic kidney disease with stage 1 through stage 4 chronic kidney disease, or unspecified chronic kidney disease: Secondary | ICD-10-CM | POA: Diagnosis not present

## 2015-11-25 DIAGNOSIS — Z950 Presence of cardiac pacemaker: Secondary | ICD-10-CM | POA: Diagnosis not present

## 2015-11-25 DIAGNOSIS — G8929 Other chronic pain: Secondary | ICD-10-CM | POA: Diagnosis not present

## 2015-11-25 DIAGNOSIS — N183 Chronic kidney disease, stage 3 (moderate): Secondary | ICD-10-CM | POA: Diagnosis not present

## 2015-11-25 DIAGNOSIS — I482 Chronic atrial fibrillation: Secondary | ICD-10-CM | POA: Diagnosis not present

## 2015-11-26 ENCOUNTER — Ambulatory Visit (INDEPENDENT_AMBULATORY_CARE_PROVIDER_SITE_OTHER): Payer: Medicare Other | Admitting: Pharmacist Clinician (PhC)/ Clinical Pharmacy Specialist

## 2015-11-26 DIAGNOSIS — Z794 Long term (current) use of insulin: Secondary | ICD-10-CM | POA: Diagnosis not present

## 2015-11-26 DIAGNOSIS — E039 Hypothyroidism, unspecified: Secondary | ICD-10-CM | POA: Diagnosis not present

## 2015-11-26 DIAGNOSIS — S72351D Displaced comminuted fracture of shaft of right femur, subsequent encounter for closed fracture with routine healing: Secondary | ICD-10-CM | POA: Diagnosis not present

## 2015-11-26 DIAGNOSIS — Z950 Presence of cardiac pacemaker: Secondary | ICD-10-CM | POA: Diagnosis not present

## 2015-11-26 DIAGNOSIS — G8929 Other chronic pain: Secondary | ICD-10-CM | POA: Diagnosis not present

## 2015-11-26 DIAGNOSIS — I129 Hypertensive chronic kidney disease with stage 1 through stage 4 chronic kidney disease, or unspecified chronic kidney disease: Secondary | ICD-10-CM | POA: Diagnosis not present

## 2015-11-26 DIAGNOSIS — Z7901 Long term (current) use of anticoagulants: Secondary | ICD-10-CM

## 2015-11-26 DIAGNOSIS — K219 Gastro-esophageal reflux disease without esophagitis: Secondary | ICD-10-CM | POA: Diagnosis not present

## 2015-11-26 DIAGNOSIS — E119 Type 2 diabetes mellitus without complications: Secondary | ICD-10-CM | POA: Diagnosis not present

## 2015-11-26 DIAGNOSIS — Z9181 History of falling: Secondary | ICD-10-CM | POA: Diagnosis not present

## 2015-11-26 DIAGNOSIS — E785 Hyperlipidemia, unspecified: Secondary | ICD-10-CM | POA: Diagnosis not present

## 2015-11-26 DIAGNOSIS — N183 Chronic kidney disease, stage 3 (moderate): Secondary | ICD-10-CM | POA: Diagnosis not present

## 2015-11-26 DIAGNOSIS — M6281 Muscle weakness (generalized): Secondary | ICD-10-CM | POA: Diagnosis not present

## 2015-11-26 DIAGNOSIS — I482 Chronic atrial fibrillation: Secondary | ICD-10-CM | POA: Diagnosis not present

## 2015-11-26 LAB — POCT INR: INR: 4.3

## 2015-11-27 DIAGNOSIS — Z950 Presence of cardiac pacemaker: Secondary | ICD-10-CM | POA: Diagnosis not present

## 2015-11-27 DIAGNOSIS — Z9181 History of falling: Secondary | ICD-10-CM | POA: Diagnosis not present

## 2015-11-27 DIAGNOSIS — Z794 Long term (current) use of insulin: Secondary | ICD-10-CM | POA: Diagnosis not present

## 2015-11-27 DIAGNOSIS — M6281 Muscle weakness (generalized): Secondary | ICD-10-CM | POA: Diagnosis not present

## 2015-11-27 DIAGNOSIS — N183 Chronic kidney disease, stage 3 (moderate): Secondary | ICD-10-CM | POA: Diagnosis not present

## 2015-11-27 DIAGNOSIS — G8929 Other chronic pain: Secondary | ICD-10-CM | POA: Diagnosis not present

## 2015-11-27 DIAGNOSIS — K219 Gastro-esophageal reflux disease without esophagitis: Secondary | ICD-10-CM | POA: Diagnosis not present

## 2015-11-27 DIAGNOSIS — E119 Type 2 diabetes mellitus without complications: Secondary | ICD-10-CM | POA: Diagnosis not present

## 2015-11-27 DIAGNOSIS — E039 Hypothyroidism, unspecified: Secondary | ICD-10-CM | POA: Diagnosis not present

## 2015-11-27 DIAGNOSIS — E785 Hyperlipidemia, unspecified: Secondary | ICD-10-CM | POA: Diagnosis not present

## 2015-11-27 DIAGNOSIS — I129 Hypertensive chronic kidney disease with stage 1 through stage 4 chronic kidney disease, or unspecified chronic kidney disease: Secondary | ICD-10-CM | POA: Diagnosis not present

## 2015-11-27 DIAGNOSIS — S72351D Displaced comminuted fracture of shaft of right femur, subsequent encounter for closed fracture with routine healing: Secondary | ICD-10-CM | POA: Diagnosis not present

## 2015-11-27 DIAGNOSIS — I482 Chronic atrial fibrillation: Secondary | ICD-10-CM | POA: Diagnosis not present

## 2015-12-02 DIAGNOSIS — I482 Chronic atrial fibrillation: Secondary | ICD-10-CM | POA: Diagnosis not present

## 2015-12-02 DIAGNOSIS — E119 Type 2 diabetes mellitus without complications: Secondary | ICD-10-CM | POA: Diagnosis not present

## 2015-12-02 DIAGNOSIS — I129 Hypertensive chronic kidney disease with stage 1 through stage 4 chronic kidney disease, or unspecified chronic kidney disease: Secondary | ICD-10-CM | POA: Diagnosis not present

## 2015-12-02 DIAGNOSIS — N183 Chronic kidney disease, stage 3 (moderate): Secondary | ICD-10-CM | POA: Diagnosis not present

## 2015-12-03 ENCOUNTER — Ambulatory Visit (INDEPENDENT_AMBULATORY_CARE_PROVIDER_SITE_OTHER): Payer: Medicare Other | Admitting: Pharmacist Clinician (PhC)/ Clinical Pharmacy Specialist

## 2015-12-03 DIAGNOSIS — I482 Chronic atrial fibrillation: Secondary | ICD-10-CM | POA: Diagnosis not present

## 2015-12-03 DIAGNOSIS — E119 Type 2 diabetes mellitus without complications: Secondary | ICD-10-CM | POA: Diagnosis not present

## 2015-12-03 DIAGNOSIS — S72351D Displaced comminuted fracture of shaft of right femur, subsequent encounter for closed fracture with routine healing: Secondary | ICD-10-CM | POA: Diagnosis not present

## 2015-12-03 DIAGNOSIS — N183 Chronic kidney disease, stage 3 (moderate): Secondary | ICD-10-CM | POA: Diagnosis not present

## 2015-12-03 DIAGNOSIS — I129 Hypertensive chronic kidney disease with stage 1 through stage 4 chronic kidney disease, or unspecified chronic kidney disease: Secondary | ICD-10-CM | POA: Diagnosis not present

## 2015-12-03 DIAGNOSIS — Z7901 Long term (current) use of anticoagulants: Secondary | ICD-10-CM

## 2015-12-03 LAB — POCT INR: INR: 3.1

## 2015-12-04 ENCOUNTER — Telehealth: Payer: Self-pay | Admitting: *Deleted

## 2015-12-04 DIAGNOSIS — N183 Chronic kidney disease, stage 3 (moderate): Secondary | ICD-10-CM | POA: Diagnosis not present

## 2015-12-04 DIAGNOSIS — I129 Hypertensive chronic kidney disease with stage 1 through stage 4 chronic kidney disease, or unspecified chronic kidney disease: Secondary | ICD-10-CM | POA: Diagnosis not present

## 2015-12-04 DIAGNOSIS — E119 Type 2 diabetes mellitus without complications: Secondary | ICD-10-CM | POA: Diagnosis not present

## 2015-12-04 DIAGNOSIS — I482 Chronic atrial fibrillation: Secondary | ICD-10-CM | POA: Diagnosis not present

## 2015-12-04 NOTE — Telephone Encounter (Signed)
Pam from [place] left vm wanting to know if pt needs a follow up UA since completing her abx.  Please advise.

## 2015-12-04 NOTE — Telephone Encounter (Signed)
Pam notified. 

## 2015-12-04 NOTE — Telephone Encounter (Signed)
No repeat culture needed unless she is still having any symptoms.

## 2015-12-09 ENCOUNTER — Other Ambulatory Visit: Payer: Self-pay | Admitting: Family Medicine

## 2015-12-09 ENCOUNTER — Telehealth: Payer: Self-pay | Admitting: Cardiovascular Disease

## 2015-12-09 NOTE — Telephone Encounter (Signed)
Informed [name] with Cira Rue of Dr. Kennon Holter order.

## 2015-12-09 NOTE — Telephone Encounter (Signed)
Please continue your order for home health to check INRs

## 2015-12-09 NOTE — Telephone Encounter (Signed)
[name] with [name] needs an order to continue checking [name]'s PT/INR while she is still home bound

## 2015-12-09 NOTE — Telephone Encounter (Signed)
Please advise 

## 2015-12-10 DIAGNOSIS — S72491D Other fracture of lower end of right femur, subsequent encounter for closed fracture with routine healing: Secondary | ICD-10-CM | POA: Diagnosis not present

## 2015-12-11 DIAGNOSIS — E119 Type 2 diabetes mellitus without complications: Secondary | ICD-10-CM | POA: Diagnosis not present

## 2015-12-11 DIAGNOSIS — I129 Hypertensive chronic kidney disease with stage 1 through stage 4 chronic kidney disease, or unspecified chronic kidney disease: Secondary | ICD-10-CM | POA: Diagnosis not present

## 2015-12-11 DIAGNOSIS — N183 Chronic kidney disease, stage 3 (moderate): Secondary | ICD-10-CM | POA: Diagnosis not present

## 2015-12-11 DIAGNOSIS — I482 Chronic atrial fibrillation: Secondary | ICD-10-CM | POA: Diagnosis not present

## 2015-12-12 ENCOUNTER — Ambulatory Visit (INDEPENDENT_AMBULATORY_CARE_PROVIDER_SITE_OTHER): Payer: Medicare Other | Admitting: Pharmacist Clinician (PhC)/ Clinical Pharmacy Specialist

## 2015-12-12 DIAGNOSIS — I129 Hypertensive chronic kidney disease with stage 1 through stage 4 chronic kidney disease, or unspecified chronic kidney disease: Secondary | ICD-10-CM | POA: Diagnosis not present

## 2015-12-12 DIAGNOSIS — N183 Chronic kidney disease, stage 3 (moderate): Secondary | ICD-10-CM | POA: Diagnosis not present

## 2015-12-12 DIAGNOSIS — R350 Frequency of micturition: Secondary | ICD-10-CM | POA: Diagnosis not present

## 2015-12-12 DIAGNOSIS — Z7901 Long term (current) use of anticoagulants: Secondary | ICD-10-CM

## 2015-12-12 DIAGNOSIS — I482 Chronic atrial fibrillation: Secondary | ICD-10-CM | POA: Diagnosis not present

## 2015-12-12 DIAGNOSIS — E119 Type 2 diabetes mellitus without complications: Secondary | ICD-10-CM | POA: Diagnosis not present

## 2015-12-12 LAB — PROTIME-INR: INR: 3.6 — AB (ref <=1.1)

## 2015-12-15 DIAGNOSIS — S72351D Displaced comminuted fracture of shaft of right femur, subsequent encounter for closed fracture with routine healing: Secondary | ICD-10-CM | POA: Diagnosis not present

## 2015-12-16 DIAGNOSIS — N183 Chronic kidney disease, stage 3 (moderate): Secondary | ICD-10-CM | POA: Diagnosis not present

## 2015-12-16 DIAGNOSIS — I482 Chronic atrial fibrillation: Secondary | ICD-10-CM | POA: Diagnosis not present

## 2015-12-16 DIAGNOSIS — E119 Type 2 diabetes mellitus without complications: Secondary | ICD-10-CM | POA: Diagnosis not present

## 2015-12-16 DIAGNOSIS — I129 Hypertensive chronic kidney disease with stage 1 through stage 4 chronic kidney disease, or unspecified chronic kidney disease: Secondary | ICD-10-CM | POA: Diagnosis not present

## 2015-12-18 ENCOUNTER — Ambulatory Visit (INDEPENDENT_AMBULATORY_CARE_PROVIDER_SITE_OTHER): Payer: Self-pay | Admitting: Pharmacist Clinician (PhC)/ Clinical Pharmacy Specialist

## 2015-12-18 DIAGNOSIS — I482 Chronic atrial fibrillation: Secondary | ICD-10-CM | POA: Diagnosis not present

## 2015-12-18 DIAGNOSIS — N183 Chronic kidney disease, stage 3 (moderate): Secondary | ICD-10-CM | POA: Diagnosis not present

## 2015-12-18 DIAGNOSIS — I129 Hypertensive chronic kidney disease with stage 1 through stage 4 chronic kidney disease, or unspecified chronic kidney disease: Secondary | ICD-10-CM | POA: Diagnosis not present

## 2015-12-18 DIAGNOSIS — E119 Type 2 diabetes mellitus without complications: Secondary | ICD-10-CM | POA: Diagnosis not present

## 2015-12-18 DIAGNOSIS — Z7901 Long term (current) use of anticoagulants: Secondary | ICD-10-CM

## 2015-12-18 LAB — POCT INR: INR: 2.3

## 2015-12-24 ENCOUNTER — Encounter: Payer: Self-pay | Admitting: Family Medicine

## 2015-12-24 ENCOUNTER — Ambulatory Visit (INDEPENDENT_AMBULATORY_CARE_PROVIDER_SITE_OTHER): Payer: Medicare Other | Admitting: Family Medicine

## 2015-12-24 VITALS — BP 112/70 | HR 74

## 2015-12-24 DIAGNOSIS — R350 Frequency of micturition: Secondary | ICD-10-CM | POA: Diagnosis not present

## 2015-12-24 DIAGNOSIS — E1142 Type 2 diabetes mellitus with diabetic polyneuropathy: Secondary | ICD-10-CM

## 2015-12-24 DIAGNOSIS — I10 Essential (primary) hypertension: Secondary | ICD-10-CM

## 2015-12-24 DIAGNOSIS — S51011A Laceration without foreign body of right elbow, initial encounter: Secondary | ICD-10-CM

## 2015-12-24 LAB — POCT GLYCOSYLATED HEMOGLOBIN (HGB A1C): Hemoglobin A1C: 8

## 2015-12-24 MED ORDER — INSULIN LISPRO 100 UNIT/ML (KWIKPEN): 8.0000 [IU] | PEN_INJECTOR | Freq: Three times a day (TID) | SUBCUTANEOUS | Status: DC

## 2015-12-24 MED ORDER — GLIPIZIDE ER 5 MG PO TB24: 5.0000 mg | ORAL_TABLET | Freq: Every day | ORAL | Status: DC

## 2015-12-24 NOTE — Progress Notes (Signed)
   Subjective:    Patient ID: [name], female    DOB: [birth year], 81 y.o.   MRN: [id number]  HPI  Diabetes - no hypoglycemic events. No wounds or sores that are not healing well. No increased thirst or urination. Checking glucose at home. Taking medications as prescribed without any side effects.  Currently on Januvia and needs to change to cheaper option.  Using 22 units of Lantus.   She injured her arm on the doorway leaving her home this AM. She has a large abrasion  Had  a repeat Urine culture from last UTI done through [facility] but says she never received a call about the results.  Her sxs have resolved.  .   She is not walking well. She is using a Probation officer.    Hypertension- Pt denies chest pain, SOB, dizziness, or heart palpitations.  Taking meds as directed w/o problems.  Denies medication side effects.     Review of Systems     Objective:   Physical Exam  Constitutional: She is oriented to person, place, and time. She appears well-developed and well-nourished.  HENT:  Head: Normocephalic and atraumatic.  Cardiovascular: Normal rate, regular rhythm and normal heart sounds.   Pulmonary/Chest: Effort normal and breath sounds normal.  Neurological: She is alert and oriented to person, place, and time.  Skin: Skin is warm and dry.  Psychiatric: She has a normal mood and affect. Her behavior is normal.    There is a larger flap of abrased  skin approx 3 x 3 cm attached at one corner on the right elbow.  Free bleeding. No drianage.        Assessment & Plan:  DM- uncontrolled. A1C of 8.0 today.  Will d/c Januvia bc of cost. Will try glipizide. Did warn about potential hypoglycemia.   Foot Exam peformed today.  She is on a statin. Need ot get urine microalbumin.    Abrasion to right elbow. cleaned with sterile saline.  Re-approximated the skin flap and used Dermabond to re-approximate the skin.  She tolerated this well. Monitor for infection.  Apply gauze once the  solution was dry with Coban dressing.    HTN - well controlled.

## 2015-12-25 ENCOUNTER — Ambulatory Visit (INDEPENDENT_AMBULATORY_CARE_PROVIDER_SITE_OTHER): Payer: Medicare Other | Admitting: Pharmacist Clinician (PhC)/ Clinical Pharmacy Specialist

## 2015-12-25 DIAGNOSIS — I129 Hypertensive chronic kidney disease with stage 1 through stage 4 chronic kidney disease, or unspecified chronic kidney disease: Secondary | ICD-10-CM | POA: Diagnosis not present

## 2015-12-25 DIAGNOSIS — E119 Type 2 diabetes mellitus without complications: Secondary | ICD-10-CM | POA: Diagnosis not present

## 2015-12-25 DIAGNOSIS — Z7901 Long term (current) use of anticoagulants: Secondary | ICD-10-CM

## 2015-12-25 DIAGNOSIS — I482 Chronic atrial fibrillation: Secondary | ICD-10-CM | POA: Diagnosis not present

## 2015-12-25 DIAGNOSIS — N183 Chronic kidney disease, stage 3 (moderate): Secondary | ICD-10-CM | POA: Diagnosis not present

## 2015-12-25 LAB — POCT INR: INR: 2.6

## 2015-12-30 DIAGNOSIS — N183 Chronic kidney disease, stage 3 (moderate): Secondary | ICD-10-CM | POA: Diagnosis not present

## 2015-12-30 DIAGNOSIS — I482 Chronic atrial fibrillation: Secondary | ICD-10-CM | POA: Diagnosis not present

## 2015-12-30 DIAGNOSIS — E119 Type 2 diabetes mellitus without complications: Secondary | ICD-10-CM | POA: Diagnosis not present

## 2015-12-30 DIAGNOSIS — I129 Hypertensive chronic kidney disease with stage 1 through stage 4 chronic kidney disease, or unspecified chronic kidney disease: Secondary | ICD-10-CM | POA: Diagnosis not present

## 2016-01-01 ENCOUNTER — Ambulatory Visit (INDEPENDENT_AMBULATORY_CARE_PROVIDER_SITE_OTHER): Payer: Medicare Other | Admitting: Pharmacist Clinician (PhC)/ Clinical Pharmacy Specialist

## 2016-01-01 DIAGNOSIS — I129 Hypertensive chronic kidney disease with stage 1 through stage 4 chronic kidney disease, or unspecified chronic kidney disease: Secondary | ICD-10-CM | POA: Diagnosis not present

## 2016-01-01 DIAGNOSIS — E119 Type 2 diabetes mellitus without complications: Secondary | ICD-10-CM | POA: Diagnosis not present

## 2016-01-01 DIAGNOSIS — N183 Chronic kidney disease, stage 3 (moderate): Secondary | ICD-10-CM | POA: Diagnosis not present

## 2016-01-01 DIAGNOSIS — Z7901 Long term (current) use of anticoagulants: Secondary | ICD-10-CM

## 2016-01-01 DIAGNOSIS — I482 Chronic atrial fibrillation: Secondary | ICD-10-CM | POA: Diagnosis not present

## 2016-01-01 LAB — POCT INR: INR: 1.6

## 2016-01-02 ENCOUNTER — Ambulatory Visit: Payer: Medicare Other | Admitting: Family Medicine

## 2016-01-02 ENCOUNTER — Telehealth: Payer: Self-pay

## 2016-01-02 LAB — MICROALBUMIN / CREATININE URINE RATIO
Creatinine, Urine: 98 mg/dL (ref 20–320)
MICROALB UR: 1.5 mg/dL
MICROALB/CREAT RATIO: 15 ug/mg{creat} (ref <30)

## 2016-01-02 NOTE — Telephone Encounter (Signed)
Patient is going to go to UC this evening.

## 2016-01-02 NOTE — Telephone Encounter (Signed)
Patient still has a fever and is confused. Daughter is worried about her going through the weekend without an antibiotic. I called the lab to ask about the urine culture and they were unable to run urine culture because they didn't know it was there and now it is too late.   Patient also has swelling and pain in her leg. I advised the daughter that she will need to be evaluated.

## 2016-01-03 DIAGNOSIS — E1140 Type 2 diabetes mellitus with diabetic neuropathy, unspecified: Secondary | ICD-10-CM | POA: Diagnosis not present

## 2016-01-03 DIAGNOSIS — S72351D Displaced comminuted fracture of shaft of right femur, subsequent encounter for closed fracture with routine healing: Secondary | ICD-10-CM | POA: Diagnosis not present

## 2016-01-03 DIAGNOSIS — Z9049 Acquired absence of other specified parts of digestive tract: Secondary | ICD-10-CM | POA: Diagnosis not present

## 2016-01-03 DIAGNOSIS — Z7901 Long term (current) use of anticoagulants: Secondary | ICD-10-CM | POA: Diagnosis not present

## 2016-01-03 DIAGNOSIS — I10 Essential (primary) hypertension: Secondary | ICD-10-CM | POA: Diagnosis not present

## 2016-01-03 DIAGNOSIS — Z79899 Other long term (current) drug therapy: Secondary | ICD-10-CM | POA: Diagnosis not present

## 2016-01-03 DIAGNOSIS — M7989 Other specified soft tissue disorders: Secondary | ICD-10-CM | POA: Diagnosis not present

## 2016-01-03 DIAGNOSIS — R509 Fever, unspecified: Secondary | ICD-10-CM | POA: Diagnosis not present

## 2016-01-03 DIAGNOSIS — K219 Gastro-esophageal reflux disease without esophagitis: Secondary | ICD-10-CM | POA: Diagnosis not present

## 2016-01-03 DIAGNOSIS — M25561 Pain in right knee: Secondary | ICD-10-CM | POA: Diagnosis not present

## 2016-01-03 DIAGNOSIS — Z79891 Long term (current) use of opiate analgesic: Secondary | ICD-10-CM | POA: Diagnosis not present

## 2016-01-03 DIAGNOSIS — E079 Disorder of thyroid, unspecified: Secondary | ICD-10-CM | POA: Diagnosis not present

## 2016-01-03 DIAGNOSIS — I700 Atherosclerosis of aorta: Secondary | ICD-10-CM | POA: Diagnosis not present

## 2016-01-03 DIAGNOSIS — R5383 Other fatigue: Secondary | ICD-10-CM | POA: Diagnosis not present

## 2016-01-03 DIAGNOSIS — Z794 Long term (current) use of insulin: Secondary | ICD-10-CM | POA: Diagnosis not present

## 2016-01-03 DIAGNOSIS — Z950 Presence of cardiac pacemaker: Secondary | ICD-10-CM | POA: Diagnosis not present

## 2016-01-03 DIAGNOSIS — Z7982 Long term (current) use of aspirin: Secondary | ICD-10-CM | POA: Diagnosis not present

## 2016-01-03 DIAGNOSIS — N390 Urinary tract infection, site not specified: Secondary | ICD-10-CM | POA: Diagnosis not present

## 2016-01-04 LAB — URINE CULTURE: Colony Count: >=100000

## 2016-01-07 ENCOUNTER — Telehealth: Payer: Self-pay | Admitting: Cardiovascular Disease

## 2016-01-07 DIAGNOSIS — N183 Chronic kidney disease, stage 3 (moderate): Secondary | ICD-10-CM | POA: Diagnosis not present

## 2016-01-07 DIAGNOSIS — S72491D Other fracture of lower end of right femur, subsequent encounter for closed fracture with routine healing: Secondary | ICD-10-CM | POA: Diagnosis not present

## 2016-01-07 DIAGNOSIS — Z4789 Encounter for other orthopedic aftercare: Secondary | ICD-10-CM | POA: Diagnosis not present

## 2016-01-07 DIAGNOSIS — I482 Chronic atrial fibrillation: Secondary | ICD-10-CM | POA: Diagnosis not present

## 2016-01-07 DIAGNOSIS — I129 Hypertensive chronic kidney disease with stage 1 through stage 4 chronic kidney disease, or unspecified chronic kidney disease: Secondary | ICD-10-CM | POA: Diagnosis not present

## 2016-01-07 DIAGNOSIS — E119 Type 2 diabetes mellitus without complications: Secondary | ICD-10-CM | POA: Diagnosis not present

## 2016-01-07 LAB — POCT INR: INR: 2.7

## 2016-01-07 NOTE — Telephone Encounter (Signed)
Received a call from [place] with [facility] calling to report patient's INR 2.7.PT 32.7.Message sent to [facility].

## 2016-01-08 ENCOUNTER — Ambulatory Visit (INDEPENDENT_AMBULATORY_CARE_PROVIDER_SITE_OTHER): Payer: Medicare Other | Admitting: Pharmacist Clinician (PhC)/ Clinical Pharmacy Specialist

## 2016-01-08 DIAGNOSIS — E119 Type 2 diabetes mellitus without complications: Secondary | ICD-10-CM | POA: Diagnosis not present

## 2016-01-08 DIAGNOSIS — Z7901 Long term (current) use of anticoagulants: Secondary | ICD-10-CM

## 2016-01-08 DIAGNOSIS — I129 Hypertensive chronic kidney disease with stage 1 through stage 4 chronic kidney disease, or unspecified chronic kidney disease: Secondary | ICD-10-CM | POA: Diagnosis not present

## 2016-01-08 DIAGNOSIS — I482 Chronic atrial fibrillation: Secondary | ICD-10-CM | POA: Diagnosis not present

## 2016-01-08 DIAGNOSIS — N183 Chronic kidney disease, stage 3 (moderate): Secondary | ICD-10-CM | POA: Diagnosis not present

## 2016-01-08 NOTE — Telephone Encounter (Signed)
See anticoag note

## 2016-01-09 DIAGNOSIS — E119 Type 2 diabetes mellitus without complications: Secondary | ICD-10-CM | POA: Diagnosis not present

## 2016-01-09 DIAGNOSIS — I482 Chronic atrial fibrillation: Secondary | ICD-10-CM | POA: Diagnosis not present

## 2016-01-09 DIAGNOSIS — I129 Hypertensive chronic kidney disease with stage 1 through stage 4 chronic kidney disease, or unspecified chronic kidney disease: Secondary | ICD-10-CM | POA: Diagnosis not present

## 2016-01-09 DIAGNOSIS — N183 Chronic kidney disease, stage 3 (moderate): Secondary | ICD-10-CM | POA: Diagnosis not present

## 2016-01-13 DIAGNOSIS — E119 Type 2 diabetes mellitus without complications: Secondary | ICD-10-CM | POA: Diagnosis not present

## 2016-01-13 DIAGNOSIS — N183 Chronic kidney disease, stage 3 (moderate): Secondary | ICD-10-CM | POA: Diagnosis not present

## 2016-01-13 DIAGNOSIS — I482 Chronic atrial fibrillation: Secondary | ICD-10-CM | POA: Diagnosis not present

## 2016-01-13 DIAGNOSIS — I129 Hypertensive chronic kidney disease with stage 1 through stage 4 chronic kidney disease, or unspecified chronic kidney disease: Secondary | ICD-10-CM | POA: Diagnosis not present

## 2016-01-14 ENCOUNTER — Ambulatory Visit (INDEPENDENT_AMBULATORY_CARE_PROVIDER_SITE_OTHER): Payer: Medicare Other

## 2016-01-14 ENCOUNTER — Encounter: Payer: Self-pay | Admitting: Family Medicine

## 2016-01-14 ENCOUNTER — Ambulatory Visit (INDEPENDENT_AMBULATORY_CARE_PROVIDER_SITE_OTHER): Payer: Medicare Other | Admitting: Family Medicine

## 2016-01-14 VITALS — BP 109/43 | HR 85 | Temp 97.9°F

## 2016-01-14 DIAGNOSIS — M7989 Other specified soft tissue disorders: Secondary | ICD-10-CM | POA: Diagnosis not present

## 2016-01-14 DIAGNOSIS — M25571 Pain in right ankle and joints of right foot: Secondary | ICD-10-CM | POA: Diagnosis not present

## 2016-01-14 DIAGNOSIS — M79671 Pain in right foot: Secondary | ICD-10-CM | POA: Diagnosis not present

## 2016-01-14 DIAGNOSIS — N3000 Acute cystitis without hematuria: Secondary | ICD-10-CM | POA: Diagnosis not present

## 2016-01-14 DIAGNOSIS — R300 Dysuria: Secondary | ICD-10-CM | POA: Diagnosis not present

## 2016-01-14 DIAGNOSIS — M85871 Other specified disorders of bone density and structure, right ankle and foot: Secondary | ICD-10-CM | POA: Diagnosis not present

## 2016-01-14 NOTE — Progress Notes (Signed)
Subjective:    Patient ID: [name], female    DOB: 02/25/1928, 82 y.o.   MRN: [id number]  HPI Patient is coming in today to follow-up for emergency department visit for UTI. She had had a fever for approximately 2-3 days. In fact, she called our office and we have recommended that she come in for Rocephin injection. Unfortunately she was not able to make it into our office. She went to up ED at [facility]. They gave her IVs Rocephin and started on Keflex 500 mg 3 times a day. At the time she was also worried about a possible blood clot in her leg and so they requested an ultrasound. The Doppler was negative for DVT. She tested negative for influenza.    Hx of femur  fracture 9/13 and followed at [facility]. This has been healing well and in fact she has a follow-up in about 2 weeks with Dr Alvan Dame [facility] orthopedics. Then this past  Sat she hurt her foot (3 days ago).  He was about 6:15 in the morning and she was getting up from her bed to use the potty chair beside her bed and she fell and twisted her ankle. She has been able to bear weight on it. The following day on Monday a proximally 2 days ago she fell again in a very similar situation but was unable to get up for almost an hour. Check she landed on top of her foot this time. It's now swollen and bruised and she is worried that it may be fractured. I have called the orthopedic office and they recommended that she have x-rays done   Review of Systems     Objective:   Physical Exam  Constitutional: She is oriented to person, place, and time. She appears well-developed and well-nourished.  HENT:  Head: Normocephalic and atraumatic.  Eyes: Conjunctivae and EOM are normal.  Cardiovascular: Normal rate, regular rhythm and normal heart sounds.   Pulmonary/Chest: Effort normal and breath sounds normal.  Musculoskeletal:  Right ankle with some bruising over the distal portion of the foot. She is very tender along the distal end of the  metatarsals of the second, third, fourth, and fifth metatarsals. She is also tender just superior and posterior to the medial malleolus and has a fair amount of swelling around the medial malleolus and along the distal foot. She's able to flex and extend. She has some pain with pronation and supination. No increased laxity with anterior toward the ankle. No rash or break in the skin.  Neurological: She is alert and oriented to person, place, and time.  Skin: Skin is warm and dry. No pallor.  Psychiatric: She has a normal mood and affect. Her behavior is normal.  Vitals reviewed.         Assessment & Plan:  UTI-we'll repeat urine culture to confirm that the UTI is cleared.  We will tinker everywhere to see if there was a final urine culture available. It was not accessible. Will call to verify that the Keflex that she was given was adequate to clear the infection. We will also repeat a urinalysis and urine culture today as well. Next  Right ankle pain/swelling-I am concerned about potential for fracture that she is able to bear weight on it. She's very tender all along the posterior edge of the needle malleolus and over the distal second third fourth and fifth metatarsals. X-ray was negative for fracture of the ankle and metatarsals. We will notify her  orthopedist. Recommended Ace wrap since it is still quite swollen. Once the swelling goes down she might be a good candidate for an ASO.

## 2016-01-15 DIAGNOSIS — N183 Chronic kidney disease, stage 3 (moderate): Secondary | ICD-10-CM | POA: Diagnosis not present

## 2016-01-15 DIAGNOSIS — I129 Hypertensive chronic kidney disease with stage 1 through stage 4 chronic kidney disease, or unspecified chronic kidney disease: Secondary | ICD-10-CM | POA: Diagnosis not present

## 2016-01-15 DIAGNOSIS — S72351D Displaced comminuted fracture of shaft of right femur, subsequent encounter for closed fracture with routine healing: Secondary | ICD-10-CM | POA: Diagnosis not present

## 2016-01-15 DIAGNOSIS — I482 Chronic atrial fibrillation: Secondary | ICD-10-CM | POA: Diagnosis not present

## 2016-01-15 DIAGNOSIS — E119 Type 2 diabetes mellitus without complications: Secondary | ICD-10-CM | POA: Diagnosis not present

## 2016-01-16 ENCOUNTER — Ambulatory Visit (INDEPENDENT_AMBULATORY_CARE_PROVIDER_SITE_OTHER): Payer: Medicare Other | Admitting: Pharmacist Clinician (PhC)/ Clinical Pharmacy Specialist

## 2016-01-16 DIAGNOSIS — I129 Hypertensive chronic kidney disease with stage 1 through stage 4 chronic kidney disease, or unspecified chronic kidney disease: Secondary | ICD-10-CM | POA: Diagnosis not present

## 2016-01-16 DIAGNOSIS — N183 Chronic kidney disease, stage 3 (moderate): Secondary | ICD-10-CM | POA: Diagnosis not present

## 2016-01-16 DIAGNOSIS — I482 Chronic atrial fibrillation: Secondary | ICD-10-CM | POA: Diagnosis not present

## 2016-01-16 DIAGNOSIS — Z7901 Long term (current) use of anticoagulants: Secondary | ICD-10-CM

## 2016-01-16 DIAGNOSIS — E119 Type 2 diabetes mellitus without complications: Secondary | ICD-10-CM | POA: Diagnosis not present

## 2016-01-16 LAB — URINE CULTURE: Colony Count: 50000

## 2016-01-16 LAB — POCT INR: INR: 3.1

## 2016-01-21 DIAGNOSIS — N183 Chronic kidney disease, stage 3 (moderate): Secondary | ICD-10-CM | POA: Diagnosis not present

## 2016-01-21 DIAGNOSIS — I129 Hypertensive chronic kidney disease with stage 1 through stage 4 chronic kidney disease, or unspecified chronic kidney disease: Secondary | ICD-10-CM | POA: Diagnosis not present

## 2016-01-21 DIAGNOSIS — I482 Chronic atrial fibrillation: Secondary | ICD-10-CM | POA: Diagnosis not present

## 2016-01-21 DIAGNOSIS — E119 Type 2 diabetes mellitus without complications: Secondary | ICD-10-CM | POA: Diagnosis not present

## 2016-01-23 ENCOUNTER — Ambulatory Visit (INDEPENDENT_AMBULATORY_CARE_PROVIDER_SITE_OTHER): Payer: Medicare Other | Admitting: Pharmacist Clinician (PhC)/ Clinical Pharmacy Specialist

## 2016-01-23 ENCOUNTER — Telehealth: Payer: Self-pay | Admitting: Cardiovascular Disease

## 2016-01-23 DIAGNOSIS — N183 Chronic kidney disease, stage 3 (moderate): Secondary | ICD-10-CM | POA: Diagnosis not present

## 2016-01-23 DIAGNOSIS — E119 Type 2 diabetes mellitus without complications: Secondary | ICD-10-CM | POA: Diagnosis not present

## 2016-01-23 DIAGNOSIS — Z7901 Long term (current) use of anticoagulants: Secondary | ICD-10-CM

## 2016-01-23 DIAGNOSIS — I482 Chronic atrial fibrillation: Secondary | ICD-10-CM | POA: Diagnosis not present

## 2016-01-23 DIAGNOSIS — I129 Hypertensive chronic kidney disease with stage 1 through stage 4 chronic kidney disease, or unspecified chronic kidney disease: Secondary | ICD-10-CM | POA: Diagnosis not present

## 2016-01-23 LAB — PROTIME-INR: INR: 1.6 — AB (ref <=1.1)

## 2016-01-23 NOTE — Telephone Encounter (Signed)
[name] is calling to give a low pt/inr . Please call    Thanks

## 2016-01-23 NOTE — Telephone Encounter (Signed)
See anticoag note

## 2016-01-27 ENCOUNTER — Encounter: Payer: Self-pay | Admitting: Family Medicine

## 2016-01-28 DIAGNOSIS — S72491G Other fracture of lower end of right femur, subsequent encounter for closed fracture with delayed healing: Secondary | ICD-10-CM | POA: Diagnosis not present

## 2016-01-28 DIAGNOSIS — M25571 Pain in right ankle and joints of right foot: Secondary | ICD-10-CM | POA: Diagnosis not present

## 2016-01-28 DIAGNOSIS — S8254XA Nondisplaced fracture of medial malleolus of right tibia, initial encounter for closed fracture: Secondary | ICD-10-CM | POA: Diagnosis not present

## 2016-01-29 ENCOUNTER — Ambulatory Visit (INDEPENDENT_AMBULATORY_CARE_PROVIDER_SITE_OTHER): Payer: Medicare Other | Admitting: Pharmacist Clinician (PhC)/ Clinical Pharmacy Specialist

## 2016-01-29 DIAGNOSIS — E119 Type 2 diabetes mellitus without complications: Secondary | ICD-10-CM | POA: Diagnosis not present

## 2016-01-29 DIAGNOSIS — N390 Urinary tract infection, site not specified: Secondary | ICD-10-CM | POA: Diagnosis not present

## 2016-01-29 DIAGNOSIS — I482 Chronic atrial fibrillation: Secondary | ICD-10-CM | POA: Diagnosis not present

## 2016-01-29 DIAGNOSIS — N183 Chronic kidney disease, stage 3 (moderate): Secondary | ICD-10-CM | POA: Diagnosis not present

## 2016-01-29 DIAGNOSIS — Z7901 Long term (current) use of anticoagulants: Secondary | ICD-10-CM

## 2016-01-29 DIAGNOSIS — I129 Hypertensive chronic kidney disease with stage 1 through stage 4 chronic kidney disease, or unspecified chronic kidney disease: Secondary | ICD-10-CM | POA: Diagnosis not present

## 2016-01-29 LAB — POCT INR: INR: 2.5

## 2016-01-30 DIAGNOSIS — S72401K Unspecified fracture of lower end of right femur, subsequent encounter for closed fracture with nonunion: Secondary | ICD-10-CM | POA: Diagnosis not present

## 2016-02-03 DIAGNOSIS — S72351D Displaced comminuted fracture of shaft of right femur, subsequent encounter for closed fracture with routine healing: Secondary | ICD-10-CM | POA: Diagnosis not present

## 2016-02-04 ENCOUNTER — Telehealth: Payer: Self-pay | Admitting: Family Medicine

## 2016-02-04 MED ORDER — CIPROFLOXACIN HCL 500 MG PO TABS: 500.0000 mg | ORAL_TABLET | Freq: Two times a day (BID) | ORAL | Status: AC

## 2016-02-04 NOTE — Telephone Encounter (Signed)
Daughter advised.

## 2016-02-04 NOTE — Telephone Encounter (Signed)
Call patient: Received a urine culture for [name] it looks like it was positive for Klebsiella. I will send over an antibiotic to try to clear this up. Because she's had some any urinary tract infections that really like to her on prophylaxis. This would be a type of antibiotic that she would take every day to help suppress urinary tract infections. First we need to clear up the current infection and then we can maybe start a prophylactic medication if she is okay with that.

## 2016-02-05 ENCOUNTER — Ambulatory Visit (INDEPENDENT_AMBULATORY_CARE_PROVIDER_SITE_OTHER): Payer: Medicare Other | Admitting: Pharmacist Clinician (PhC)/ Clinical Pharmacy Specialist

## 2016-02-05 ENCOUNTER — Telehealth: Payer: Self-pay | Admitting: Cardiovascular Disease

## 2016-02-05 DIAGNOSIS — Z7901 Long term (current) use of anticoagulants: Secondary | ICD-10-CM

## 2016-02-05 DIAGNOSIS — I482 Chronic atrial fibrillation: Secondary | ICD-10-CM | POA: Diagnosis not present

## 2016-02-05 DIAGNOSIS — N183 Chronic kidney disease, stage 3 (moderate): Secondary | ICD-10-CM | POA: Diagnosis not present

## 2016-02-05 DIAGNOSIS — E119 Type 2 diabetes mellitus without complications: Secondary | ICD-10-CM | POA: Diagnosis not present

## 2016-02-05 DIAGNOSIS — I129 Hypertensive chronic kidney disease with stage 1 through stage 4 chronic kidney disease, or unspecified chronic kidney disease: Secondary | ICD-10-CM | POA: Diagnosis not present

## 2016-02-05 LAB — PROTIME-INR
INR: 4 — AB (ref 0.9–1.1)
INR: 4.4 — AB (ref <=1.1)

## 2016-02-05 NOTE — Telephone Encounter (Signed)
error 

## 2016-02-07 ENCOUNTER — Other Ambulatory Visit: Payer: Self-pay | Admitting: Sports Medicine

## 2016-02-12 DIAGNOSIS — I482 Chronic atrial fibrillation: Secondary | ICD-10-CM | POA: Diagnosis not present

## 2016-02-12 DIAGNOSIS — N183 Chronic kidney disease, stage 3 (moderate): Secondary | ICD-10-CM | POA: Diagnosis not present

## 2016-02-12 DIAGNOSIS — E119 Type 2 diabetes mellitus without complications: Secondary | ICD-10-CM | POA: Diagnosis not present

## 2016-02-12 DIAGNOSIS — I129 Hypertensive chronic kidney disease with stage 1 through stage 4 chronic kidney disease, or unspecified chronic kidney disease: Secondary | ICD-10-CM | POA: Diagnosis not present

## 2016-02-12 LAB — PROTIME-INR: INR: 1.3 — AB (ref <=1.1)

## 2016-02-13 ENCOUNTER — Ambulatory Visit (INDEPENDENT_AMBULATORY_CARE_PROVIDER_SITE_OTHER): Payer: Medicare Other | Admitting: Pharmacist Clinician (PhC)/ Clinical Pharmacy Specialist

## 2016-02-13 DIAGNOSIS — Z7901 Long term (current) use of anticoagulants: Secondary | ICD-10-CM

## 2016-02-15 DIAGNOSIS — S72351D Displaced comminuted fracture of shaft of right femur, subsequent encounter for closed fracture with routine healing: Secondary | ICD-10-CM | POA: Diagnosis not present

## 2016-02-19 ENCOUNTER — Ambulatory Visit (INDEPENDENT_AMBULATORY_CARE_PROVIDER_SITE_OTHER): Payer: Medicare Other | Admitting: Pharmacist Clinician (PhC)/ Clinical Pharmacy Specialist

## 2016-02-19 DIAGNOSIS — I129 Hypertensive chronic kidney disease with stage 1 through stage 4 chronic kidney disease, or unspecified chronic kidney disease: Secondary | ICD-10-CM | POA: Diagnosis not present

## 2016-02-19 DIAGNOSIS — Z7901 Long term (current) use of anticoagulants: Secondary | ICD-10-CM

## 2016-02-19 DIAGNOSIS — N183 Chronic kidney disease, stage 3 (moderate): Secondary | ICD-10-CM | POA: Diagnosis not present

## 2016-02-19 DIAGNOSIS — E119 Type 2 diabetes mellitus without complications: Secondary | ICD-10-CM | POA: Diagnosis not present

## 2016-02-19 DIAGNOSIS — I482 Chronic atrial fibrillation: Secondary | ICD-10-CM | POA: Diagnosis not present

## 2016-02-19 LAB — POCT INR: INR: 2.6

## 2016-02-22 ENCOUNTER — Other Ambulatory Visit: Payer: Self-pay | Admitting: Family Medicine

## 2016-02-23 ENCOUNTER — Telehealth: Payer: Self-pay

## 2016-02-23 MED ORDER — ESTRADIOL 0.1 MG/GM VA CREA: 1.0000 | TOPICAL_CREAM | VAGINAL | Status: DC

## 2016-02-23 NOTE — Telephone Encounter (Signed)
[name], Mrs Wyricks daughter, called and states she has finished the antibiotic. She states she was supposed to call so we could send in a prophylactic antibiotic.

## 2016-02-23 NOTE — Telephone Encounter (Signed)
The best  treatment in post menopausal women is estrogen cream. This is applied around the urethra and outside vaginal area 3 times per week.  I sent a Rx to [facility].  It says insert vaginally on the rx but really just apply small amouth externallly and one tube will last her severeal months.  If she doesn't want sent to [facility] we can cancel and send locally.

## 2016-02-24 NOTE — Telephone Encounter (Signed)
Patient's daughter advised.  

## 2016-02-24 NOTE — Telephone Encounter (Signed)
Left detailed message advising of recommendations.  

## 2016-02-25 ENCOUNTER — Ambulatory Visit (INDEPENDENT_AMBULATORY_CARE_PROVIDER_SITE_OTHER): Payer: Medicare Other | Admitting: Pharmacist Clinician (PhC)/ Clinical Pharmacy Specialist

## 2016-02-25 ENCOUNTER — Encounter: Payer: Self-pay | Admitting: *Deleted

## 2016-02-25 DIAGNOSIS — N183 Chronic kidney disease, stage 3 (moderate): Secondary | ICD-10-CM | POA: Diagnosis not present

## 2016-02-25 DIAGNOSIS — G8929 Other chronic pain: Secondary | ICD-10-CM | POA: Diagnosis not present

## 2016-02-25 DIAGNOSIS — S8254XD Nondisplaced fracture of medial malleolus of right tibia, subsequent encounter for closed fracture with routine healing: Secondary | ICD-10-CM | POA: Diagnosis not present

## 2016-02-25 DIAGNOSIS — Z7901 Long term (current) use of anticoagulants: Secondary | ICD-10-CM | POA: Diagnosis not present

## 2016-02-25 DIAGNOSIS — Z5181 Encounter for therapeutic drug level monitoring: Secondary | ICD-10-CM | POA: Diagnosis not present

## 2016-02-25 DIAGNOSIS — E119 Type 2 diabetes mellitus without complications: Secondary | ICD-10-CM | POA: Diagnosis not present

## 2016-02-25 DIAGNOSIS — S72351S Displaced comminuted fracture of shaft of right femur, sequela: Secondary | ICD-10-CM | POA: Diagnosis not present

## 2016-02-25 DIAGNOSIS — Z9181 History of falling: Secondary | ICD-10-CM | POA: Diagnosis not present

## 2016-02-25 DIAGNOSIS — I129 Hypertensive chronic kidney disease with stage 1 through stage 4 chronic kidney disease, or unspecified chronic kidney disease: Secondary | ICD-10-CM | POA: Diagnosis not present

## 2016-02-25 DIAGNOSIS — M6281 Muscle weakness (generalized): Secondary | ICD-10-CM | POA: Diagnosis not present

## 2016-02-25 DIAGNOSIS — K219 Gastro-esophageal reflux disease without esophagitis: Secondary | ICD-10-CM | POA: Diagnosis not present

## 2016-02-25 DIAGNOSIS — E039 Hypothyroidism, unspecified: Secondary | ICD-10-CM | POA: Diagnosis not present

## 2016-02-25 DIAGNOSIS — S72491G Other fracture of lower end of right femur, subsequent encounter for closed fracture with delayed healing: Secondary | ICD-10-CM | POA: Diagnosis not present

## 2016-02-25 DIAGNOSIS — I482 Chronic atrial fibrillation: Secondary | ICD-10-CM | POA: Diagnosis not present

## 2016-02-25 DIAGNOSIS — E785 Hyperlipidemia, unspecified: Secondary | ICD-10-CM | POA: Diagnosis not present

## 2016-02-25 LAB — POCT INR: INR: 3.6

## 2016-03-02 DIAGNOSIS — S72351D Displaced comminuted fracture of shaft of right femur, subsequent encounter for closed fracture with routine healing: Secondary | ICD-10-CM | POA: Diagnosis not present

## 2016-03-03 DIAGNOSIS — S72401K Unspecified fracture of lower end of right femur, subsequent encounter for closed fracture with nonunion: Secondary | ICD-10-CM | POA: Diagnosis not present

## 2016-03-04 ENCOUNTER — Ambulatory Visit (INDEPENDENT_AMBULATORY_CARE_PROVIDER_SITE_OTHER): Payer: Medicare Other | Admitting: Pharmacist Clinician (PhC)/ Clinical Pharmacy Specialist

## 2016-03-04 DIAGNOSIS — K219 Gastro-esophageal reflux disease without esophagitis: Secondary | ICD-10-CM | POA: Diagnosis not present

## 2016-03-04 DIAGNOSIS — Z9181 History of falling: Secondary | ICD-10-CM | POA: Diagnosis not present

## 2016-03-04 DIAGNOSIS — G8929 Other chronic pain: Secondary | ICD-10-CM | POA: Diagnosis not present

## 2016-03-04 DIAGNOSIS — E039 Hypothyroidism, unspecified: Secondary | ICD-10-CM | POA: Diagnosis not present

## 2016-03-04 DIAGNOSIS — Z7901 Long term (current) use of anticoagulants: Secondary | ICD-10-CM | POA: Diagnosis not present

## 2016-03-04 DIAGNOSIS — S72351S Displaced comminuted fracture of shaft of right femur, sequela: Secondary | ICD-10-CM | POA: Diagnosis not present

## 2016-03-04 DIAGNOSIS — E785 Hyperlipidemia, unspecified: Secondary | ICD-10-CM | POA: Diagnosis not present

## 2016-03-04 DIAGNOSIS — N183 Chronic kidney disease, stage 3 (moderate): Secondary | ICD-10-CM | POA: Diagnosis not present

## 2016-03-04 DIAGNOSIS — Z5181 Encounter for therapeutic drug level monitoring: Secondary | ICD-10-CM | POA: Diagnosis not present

## 2016-03-04 DIAGNOSIS — M6281 Muscle weakness (generalized): Secondary | ICD-10-CM | POA: Diagnosis not present

## 2016-03-04 DIAGNOSIS — E119 Type 2 diabetes mellitus without complications: Secondary | ICD-10-CM | POA: Diagnosis not present

## 2016-03-04 DIAGNOSIS — I482 Chronic atrial fibrillation: Secondary | ICD-10-CM | POA: Diagnosis not present

## 2016-03-04 DIAGNOSIS — I129 Hypertensive chronic kidney disease with stage 1 through stage 4 chronic kidney disease, or unspecified chronic kidney disease: Secondary | ICD-10-CM | POA: Diagnosis not present

## 2016-03-04 LAB — PROTIME-INR: INR: 2.2 — AB (ref <=1.1)

## 2016-03-08 ENCOUNTER — Other Ambulatory Visit: Payer: Self-pay | Admitting: Family Medicine

## 2016-03-08 DIAGNOSIS — S72351A Displaced comminuted fracture of shaft of right femur, initial encounter for closed fracture: Secondary | ICD-10-CM | POA: Diagnosis not present

## 2016-03-08 DIAGNOSIS — M810 Age-related osteoporosis without current pathological fracture: Secondary | ICD-10-CM | POA: Diagnosis not present

## 2016-03-08 DIAGNOSIS — N189 Chronic kidney disease, unspecified: Secondary | ICD-10-CM | POA: Diagnosis not present

## 2016-03-08 DIAGNOSIS — E119 Type 2 diabetes mellitus without complications: Secondary | ICD-10-CM | POA: Diagnosis not present

## 2016-03-08 DIAGNOSIS — S72401K Unspecified fracture of lower end of right femur, subsequent encounter for closed fracture with nonunion: Secondary | ICD-10-CM | POA: Diagnosis not present

## 2016-03-08 DIAGNOSIS — Z01818 Encounter for other preprocedural examination: Secondary | ICD-10-CM | POA: Diagnosis not present

## 2016-03-08 DIAGNOSIS — I129 Hypertensive chronic kidney disease with stage 1 through stage 4 chronic kidney disease, or unspecified chronic kidney disease: Secondary | ICD-10-CM | POA: Diagnosis not present

## 2016-03-08 DIAGNOSIS — I480 Paroxysmal atrial fibrillation: Secondary | ICD-10-CM | POA: Diagnosis not present

## 2016-03-08 DIAGNOSIS — Z794 Long term (current) use of insulin: Secondary | ICD-10-CM | POA: Diagnosis not present

## 2016-03-08 DIAGNOSIS — Z7901 Long term (current) use of anticoagulants: Secondary | ICD-10-CM | POA: Diagnosis not present

## 2016-03-09 ENCOUNTER — Telehealth: Payer: Self-pay

## 2016-03-11 ENCOUNTER — Ambulatory Visit (INDEPENDENT_AMBULATORY_CARE_PROVIDER_SITE_OTHER): Payer: Medicare Other | Admitting: Pharmacist Clinician (PhC)/ Clinical Pharmacy Specialist

## 2016-03-11 DIAGNOSIS — M6281 Muscle weakness (generalized): Secondary | ICD-10-CM | POA: Diagnosis not present

## 2016-03-11 DIAGNOSIS — Z5181 Encounter for therapeutic drug level monitoring: Secondary | ICD-10-CM | POA: Diagnosis not present

## 2016-03-11 DIAGNOSIS — Z7901 Long term (current) use of anticoagulants: Secondary | ICD-10-CM | POA: Diagnosis not present

## 2016-03-11 DIAGNOSIS — E785 Hyperlipidemia, unspecified: Secondary | ICD-10-CM | POA: Diagnosis not present

## 2016-03-11 DIAGNOSIS — N183 Chronic kidney disease, stage 3 (moderate): Secondary | ICD-10-CM | POA: Diagnosis not present

## 2016-03-11 DIAGNOSIS — G8929 Other chronic pain: Secondary | ICD-10-CM | POA: Diagnosis not present

## 2016-03-11 DIAGNOSIS — S72351S Displaced comminuted fracture of shaft of right femur, sequela: Secondary | ICD-10-CM | POA: Diagnosis not present

## 2016-03-11 DIAGNOSIS — I129 Hypertensive chronic kidney disease with stage 1 through stage 4 chronic kidney disease, or unspecified chronic kidney disease: Secondary | ICD-10-CM | POA: Diagnosis not present

## 2016-03-11 DIAGNOSIS — E119 Type 2 diabetes mellitus without complications: Secondary | ICD-10-CM | POA: Diagnosis not present

## 2016-03-11 DIAGNOSIS — K219 Gastro-esophageal reflux disease without esophagitis: Secondary | ICD-10-CM | POA: Diagnosis not present

## 2016-03-11 DIAGNOSIS — Z9181 History of falling: Secondary | ICD-10-CM | POA: Diagnosis not present

## 2016-03-11 DIAGNOSIS — I482 Chronic atrial fibrillation: Secondary | ICD-10-CM | POA: Diagnosis not present

## 2016-03-11 DIAGNOSIS — E039 Hypothyroidism, unspecified: Secondary | ICD-10-CM | POA: Diagnosis not present

## 2016-03-11 LAB — POCT INR: INR: 3

## 2016-03-14 DIAGNOSIS — S72351D Displaced comminuted fracture of shaft of right femur, subsequent encounter for closed fracture with routine healing: Secondary | ICD-10-CM | POA: Diagnosis not present

## 2016-03-16 DIAGNOSIS — T83511A Infection and inflammatory reaction due to indwelling urethral catheter, initial encounter: Secondary | ICD-10-CM | POA: Diagnosis not present

## 2016-03-16 DIAGNOSIS — Z7409 Other reduced mobility: Secondary | ICD-10-CM | POA: Diagnosis not present

## 2016-03-16 DIAGNOSIS — E860 Dehydration: Secondary | ICD-10-CM | POA: Diagnosis not present

## 2016-03-16 DIAGNOSIS — I808 Phlebitis and thrombophlebitis of other sites: Secondary | ICD-10-CM | POA: Diagnosis not present

## 2016-03-16 DIAGNOSIS — E1140 Type 2 diabetes mellitus with diabetic neuropathy, unspecified: Secondary | ICD-10-CM | POA: Diagnosis not present

## 2016-03-16 DIAGNOSIS — I129 Hypertensive chronic kidney disease with stage 1 through stage 4 chronic kidney disease, or unspecified chronic kidney disease: Secondary | ICD-10-CM | POA: Diagnosis not present

## 2016-03-16 DIAGNOSIS — R404 Transient alteration of awareness: Secondary | ICD-10-CM | POA: Diagnosis not present

## 2016-03-16 DIAGNOSIS — M7989 Other specified soft tissue disorders: Secondary | ICD-10-CM | POA: Diagnosis not present

## 2016-03-16 DIAGNOSIS — E8770 Fluid overload, unspecified: Secondary | ICD-10-CM | POA: Diagnosis not present

## 2016-03-16 DIAGNOSIS — S7224XA Nondisplaced subtrochanteric fracture of right femur, initial encounter for closed fracture: Secondary | ICD-10-CM | POA: Diagnosis not present

## 2016-03-16 DIAGNOSIS — M810 Age-related osteoporosis without current pathological fracture: Secondary | ICD-10-CM | POA: Diagnosis not present

## 2016-03-16 DIAGNOSIS — D62 Acute posthemorrhagic anemia: Secondary | ICD-10-CM | POA: Diagnosis not present

## 2016-03-16 DIAGNOSIS — R0602 Shortness of breath: Secondary | ICD-10-CM | POA: Diagnosis not present

## 2016-03-16 DIAGNOSIS — Z4789 Encounter for other orthopedic aftercare: Secondary | ICD-10-CM | POA: Diagnosis not present

## 2016-03-16 DIAGNOSIS — E871 Hypo-osmolality and hyponatremia: Secondary | ICD-10-CM | POA: Diagnosis not present

## 2016-03-16 DIAGNOSIS — I130 Hypertensive heart and chronic kidney disease with heart failure and stage 1 through stage 4 chronic kidney disease, or unspecified chronic kidney disease: Secondary | ICD-10-CM | POA: Diagnosis not present

## 2016-03-16 DIAGNOSIS — L7632 Postprocedural hematoma of skin and subcutaneous tissue following other procedure: Secondary | ICD-10-CM | POA: Diagnosis not present

## 2016-03-16 DIAGNOSIS — M79651 Pain in right thigh: Secondary | ICD-10-CM | POA: Diagnosis not present

## 2016-03-16 DIAGNOSIS — G8918 Other acute postprocedural pain: Secondary | ICD-10-CM | POA: Diagnosis not present

## 2016-03-16 DIAGNOSIS — E039 Hypothyroidism, unspecified: Secondary | ICD-10-CM | POA: Diagnosis not present

## 2016-03-16 DIAGNOSIS — L7602 Intraoperative hemorrhage and hematoma of skin and subcutaneous tissue complicating other procedure: Secondary | ICD-10-CM | POA: Diagnosis not present

## 2016-03-16 DIAGNOSIS — E1142 Type 2 diabetes mellitus with diabetic polyneuropathy: Secondary | ICD-10-CM | POA: Diagnosis not present

## 2016-03-16 DIAGNOSIS — M96661 Fracture of femur following insertion of orthopedic implant, joint prosthesis, or bone plate, right leg: Secondary | ICD-10-CM | POA: Diagnosis not present

## 2016-03-16 DIAGNOSIS — E222 Syndrome of inappropriate secretion of antidiuretic hormone: Secondary | ICD-10-CM | POA: Diagnosis not present

## 2016-03-16 DIAGNOSIS — Z472 Encounter for removal of internal fixation device: Secondary | ICD-10-CM | POA: Diagnosis not present

## 2016-03-16 DIAGNOSIS — L89159 Pressure ulcer of sacral region, unspecified stage: Secondary | ICD-10-CM | POA: Diagnosis not present

## 2016-03-16 DIAGNOSIS — E1122 Type 2 diabetes mellitus with diabetic chronic kidney disease: Secondary | ICD-10-CM | POA: Diagnosis not present

## 2016-03-16 DIAGNOSIS — R262 Difficulty in walking, not elsewhere classified: Secondary | ICD-10-CM | POA: Diagnosis not present

## 2016-03-16 DIAGNOSIS — M6281 Muscle weakness (generalized): Secondary | ICD-10-CM | POA: Diagnosis not present

## 2016-03-16 DIAGNOSIS — S72451K Displaced supracondylar fracture without intracondylar extension of lower end of right femur, subsequent encounter for closed fracture with nonunion: Secondary | ICD-10-CM | POA: Diagnosis not present

## 2016-03-16 DIAGNOSIS — I480 Paroxysmal atrial fibrillation: Secondary | ICD-10-CM | POA: Diagnosis not present

## 2016-03-16 DIAGNOSIS — S7290XA Unspecified fracture of unspecified femur, initial encounter for closed fracture: Secondary | ICD-10-CM | POA: Insufficient documentation

## 2016-03-16 DIAGNOSIS — I495 Sick sinus syndrome: Secondary | ICD-10-CM | POA: Diagnosis not present

## 2016-03-16 DIAGNOSIS — S72301K Unspecified fracture of shaft of right femur, subsequent encounter for closed fracture with nonunion: Secondary | ICD-10-CM | POA: Diagnosis not present

## 2016-03-16 DIAGNOSIS — E1121 Type 2 diabetes mellitus with diabetic nephropathy: Secondary | ICD-10-CM | POA: Diagnosis not present

## 2016-03-16 DIAGNOSIS — K219 Gastro-esophageal reflux disease without esophagitis: Secondary | ICD-10-CM | POA: Diagnosis not present

## 2016-03-16 DIAGNOSIS — S7011XA Contusion of right thigh, initial encounter: Secondary | ICD-10-CM | POA: Diagnosis not present

## 2016-03-16 DIAGNOSIS — I5033 Acute on chronic diastolic (congestive) heart failure: Secondary | ICD-10-CM | POA: Diagnosis not present

## 2016-03-16 DIAGNOSIS — I071 Rheumatic tricuspid insufficiency: Secondary | ICD-10-CM | POA: Diagnosis not present

## 2016-03-16 DIAGNOSIS — K6289 Other specified diseases of anus and rectum: Secondary | ICD-10-CM | POA: Diagnosis not present

## 2016-03-16 DIAGNOSIS — M79604 Pain in right leg: Secondary | ICD-10-CM | POA: Diagnosis not present

## 2016-03-16 DIAGNOSIS — S7224XK Nondisplaced subtrochanteric fracture of right femur, subsequent encounter for closed fracture with nonunion: Secondary | ICD-10-CM | POA: Diagnosis not present

## 2016-03-16 DIAGNOSIS — R278 Other lack of coordination: Secondary | ICD-10-CM | POA: Diagnosis not present

## 2016-03-16 DIAGNOSIS — N183 Chronic kidney disease, stage 3 (moderate): Secondary | ICD-10-CM | POA: Diagnosis not present

## 2016-03-16 DIAGNOSIS — S72491K Other fracture of lower end of right femur, subsequent encounter for closed fracture with nonunion: Secondary | ICD-10-CM | POA: Diagnosis not present

## 2016-03-16 DIAGNOSIS — N3020 Other chronic cystitis without hematuria: Secondary | ICD-10-CM | POA: Diagnosis not present

## 2016-03-16 DIAGNOSIS — N390 Urinary tract infection, site not specified: Secondary | ICD-10-CM | POA: Diagnosis not present

## 2016-03-16 DIAGNOSIS — R1310 Dysphagia, unspecified: Secondary | ICD-10-CM | POA: Diagnosis not present

## 2016-03-16 DIAGNOSIS — S7291XD Unspecified fracture of right femur, subsequent encounter for closed fracture with routine healing: Secondary | ICD-10-CM | POA: Diagnosis not present

## 2016-03-16 DIAGNOSIS — Z950 Presence of cardiac pacemaker: Secondary | ICD-10-CM | POA: Diagnosis not present

## 2016-03-16 DIAGNOSIS — S72451A Displaced supracondylar fracture without intracondylar extension of lower end of right femur, initial encounter for closed fracture: Secondary | ICD-10-CM | POA: Diagnosis not present

## 2016-03-16 DIAGNOSIS — B958 Unspecified staphylococcus as the cause of diseases classified elsewhere: Secondary | ICD-10-CM | POA: Diagnosis not present

## 2016-03-22 ENCOUNTER — Other Ambulatory Visit: Payer: Self-pay

## 2016-03-22 MED ORDER — ESTRADIOL 0.1 MG/GM VA CREA: 1.0000 | TOPICAL_CREAM | VAGINAL | Status: DC

## 2016-03-24 ENCOUNTER — Ambulatory Visit: Payer: Medicare Other | Admitting: Family Medicine

## 2016-03-24 LAB — CBC AND DIFFERENTIAL
HEMATOCRIT: 23 % — AB (ref 36–46)
Hemoglobin: 7.4 g/dL — AB (ref 12.0–16.0)
PLATELETS: 446 10*3/uL — AB (ref 150–399)
WBC: 15.9 10^3/mL

## 2016-03-24 LAB — BASIC METABOLIC PANEL
BUN: 23 mg/dL — AB (ref 4–21)
CREATININE: 1 mg/dL (ref 0.5–1.1)
Glucose: 205 mg/dL
Potassium: 4.7 mmol/L (ref 3.4–5.3)
SODIUM: 131 mmol/L — AB (ref 137–147)

## 2016-03-29 DIAGNOSIS — R195 Other fecal abnormalities: Secondary | ICD-10-CM | POA: Diagnosis not present

## 2016-03-29 DIAGNOSIS — Z79899 Other long term (current) drug therapy: Secondary | ICD-10-CM | POA: Diagnosis not present

## 2016-03-29 DIAGNOSIS — M79604 Pain in right leg: Secondary | ICD-10-CM | POA: Diagnosis not present

## 2016-03-29 DIAGNOSIS — S72351D Displaced comminuted fracture of shaft of right femur, subsequent encounter for closed fracture with routine healing: Secondary | ICD-10-CM | POA: Diagnosis not present

## 2016-03-29 DIAGNOSIS — S72491K Other fracture of lower end of right femur, subsequent encounter for closed fracture with nonunion: Secondary | ICD-10-CM | POA: Diagnosis not present

## 2016-03-29 DIAGNOSIS — M6281 Muscle weakness (generalized): Secondary | ICD-10-CM | POA: Diagnosis not present

## 2016-03-29 DIAGNOSIS — R278 Other lack of coordination: Secondary | ICD-10-CM | POA: Diagnosis not present

## 2016-03-29 DIAGNOSIS — Z7409 Other reduced mobility: Secondary | ICD-10-CM | POA: Diagnosis not present

## 2016-03-29 DIAGNOSIS — L89159 Pressure ulcer of sacral region, unspecified stage: Secondary | ICD-10-CM | POA: Diagnosis not present

## 2016-03-29 DIAGNOSIS — I110 Hypertensive heart disease with heart failure: Secondary | ICD-10-CM | POA: Diagnosis not present

## 2016-03-29 DIAGNOSIS — M7989 Other specified soft tissue disorders: Secondary | ICD-10-CM | POA: Diagnosis not present

## 2016-03-29 DIAGNOSIS — S7011XA Contusion of right thigh, initial encounter: Secondary | ICD-10-CM | POA: Diagnosis not present

## 2016-03-29 DIAGNOSIS — R262 Difficulty in walking, not elsewhere classified: Secondary | ICD-10-CM | POA: Diagnosis not present

## 2016-03-29 DIAGNOSIS — D62 Acute posthemorrhagic anemia: Secondary | ICD-10-CM | POA: Diagnosis not present

## 2016-03-29 DIAGNOSIS — S7291XD Unspecified fracture of right femur, subsequent encounter for closed fracture with routine healing: Secondary | ICD-10-CM | POA: Diagnosis not present

## 2016-03-29 DIAGNOSIS — R05 Cough: Secondary | ICD-10-CM | POA: Diagnosis not present

## 2016-03-29 DIAGNOSIS — K219 Gastro-esophageal reflux disease without esophagitis: Secondary | ICD-10-CM | POA: Diagnosis not present

## 2016-03-29 DIAGNOSIS — R5381 Other malaise: Secondary | ICD-10-CM | POA: Diagnosis not present

## 2016-03-29 DIAGNOSIS — R404 Transient alteration of awareness: Secondary | ICD-10-CM | POA: Diagnosis not present

## 2016-03-29 DIAGNOSIS — L89150 Pressure ulcer of sacral region, unstageable: Secondary | ICD-10-CM | POA: Diagnosis not present

## 2016-03-29 DIAGNOSIS — K589 Irritable bowel syndrome without diarrhea: Secondary | ICD-10-CM | POA: Diagnosis not present

## 2016-03-29 DIAGNOSIS — D72829 Elevated white blood cell count, unspecified: Secondary | ICD-10-CM | POA: Diagnosis not present

## 2016-03-29 DIAGNOSIS — S72351S Displaced comminuted fracture of shaft of right femur, sequela: Secondary | ICD-10-CM | POA: Diagnosis not present

## 2016-03-29 DIAGNOSIS — Z4789 Encounter for other orthopedic aftercare: Secondary | ICD-10-CM | POA: Diagnosis not present

## 2016-03-29 DIAGNOSIS — R300 Dysuria: Secondary | ICD-10-CM | POA: Diagnosis not present

## 2016-03-29 DIAGNOSIS — I480 Paroxysmal atrial fibrillation: Secondary | ICD-10-CM | POA: Diagnosis not present

## 2016-03-29 DIAGNOSIS — E039 Hypothyroidism, unspecified: Secondary | ICD-10-CM | POA: Diagnosis not present

## 2016-03-29 DIAGNOSIS — S72301K Unspecified fracture of shaft of right femur, subsequent encounter for closed fracture with nonunion: Secondary | ICD-10-CM | POA: Diagnosis not present

## 2016-03-30 ENCOUNTER — Encounter: Payer: Self-pay | Admitting: Internal Medicine

## 2016-03-30 ENCOUNTER — Non-Acute Institutional Stay (SKILLED_NURSING_FACILITY): Payer: Medicare Other | Admitting: Internal Medicine

## 2016-03-30 DIAGNOSIS — F0390 Unspecified dementia without behavioral disturbance: Secondary | ICD-10-CM

## 2016-03-30 DIAGNOSIS — D62 Acute posthemorrhagic anemia: Secondary | ICD-10-CM

## 2016-03-30 DIAGNOSIS — R609 Edema, unspecified: Secondary | ICD-10-CM | POA: Diagnosis not present

## 2016-03-30 DIAGNOSIS — K219 Gastro-esophageal reflux disease without esophagitis: Secondary | ICD-10-CM

## 2016-03-30 DIAGNOSIS — E1142 Type 2 diabetes mellitus with diabetic polyneuropathy: Secondary | ICD-10-CM | POA: Diagnosis not present

## 2016-03-30 DIAGNOSIS — R059 Cough, unspecified: Secondary | ICD-10-CM

## 2016-03-30 DIAGNOSIS — S72351S Displaced comminuted fracture of shaft of right femur, sequela: Secondary | ICD-10-CM

## 2016-03-30 DIAGNOSIS — R339 Retention of urine, unspecified: Secondary | ICD-10-CM

## 2016-03-30 DIAGNOSIS — R5381 Other malaise: Secondary | ICD-10-CM | POA: Diagnosis not present

## 2016-03-30 DIAGNOSIS — L89150 Pressure ulcer of sacral region, unstageable: Secondary | ICD-10-CM | POA: Diagnosis not present

## 2016-03-30 DIAGNOSIS — N183 Chronic kidney disease, stage 3 unspecified: Secondary | ICD-10-CM

## 2016-03-30 DIAGNOSIS — R05 Cough: Secondary | ICD-10-CM

## 2016-03-30 DIAGNOSIS — I10 Essential (primary) hypertension: Secondary | ICD-10-CM | POA: Diagnosis not present

## 2016-03-30 DIAGNOSIS — I482 Chronic atrial fibrillation, unspecified: Secondary | ICD-10-CM

## 2016-03-30 DIAGNOSIS — R195 Other fecal abnormalities: Secondary | ICD-10-CM | POA: Diagnosis not present

## 2016-03-30 DIAGNOSIS — R600 Localized edema: Secondary | ICD-10-CM

## 2016-03-30 DIAGNOSIS — N390 Urinary tract infection, site not specified: Secondary | ICD-10-CM

## 2016-03-30 NOTE — Progress Notes (Signed)
LOCATION: [name]  PCP: Beatrice Lecher, MD   Code Status: Full Code  Goals of care: Advanced Directive information No flowsheet data found.     Extended Emergency Contact Information Primary Emergency Contact: [facility] Address: [address]          [place], [place] [address] [name] of [place] Phone: [phone] Mobile Phone: [phone] Relation: Daughter   Allergies  Allergen Reactions  . Ace Inhibitors     REACTION: cough  . Angiotensin Receptor Blockers Other (See Comments)    Renal dx.  Nephrology has recommended against use bc of hyperkalemia  . Fosinopril Sodium     REACTION: Cough  . Sulfamethoxazole-Trimethoprim     REACTION: Unspecified    Chief Complaint  Patient presents with  . New Admit To SNF    New Admission     HPI:  Patient is a 82 y.o. 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 She underwent ORIF and removal of deep buried hardware consisting of previously placed right sided retrograde femoral nail. She had acute blood loss anemia and received 4 u prbc transfusion. She was treated for staphylococcus UTI. She also had LUE thrombophlebitis and was treated with clindamycin. She had dyspnea thought to be from fluid overload in the hospital. CXR was negative for acute infiltrates. She has PMH of ckd stage 3, DM 2, peripheral neuropathy, HTN, GERD among others. She is seen in her room today with her daughter at bedside. She complaints of pain in her right leg and grades it as 5/10. She is due for her pain medication.   Review of Systems:  Constitutional: Negative for fever, chills, diaphoresis. Low energy level, slowly returning HENT: Negative for congestion, nasal discharge, hearing loss, sore throat, difficulty swallowing. Positive for headache.  Eyes: Negative for blurred vision, double  vision and discharge.  Respiratory: Negative for  shortness of breath and wheezing. Positive for cough but unable to cough up phlegm.   Cardiovascular: Negative for chest pain, palpitations, leg swelling.  Gastrointestinal: Negative for heartburn, nausea, vomiting, abdominal pain. Last bowel movement was yesterday. Has been having loose stool for last 2 days.  Genitourinary: Negative for dysuria.  Musculoskeletal: Negative for back pain, fall in the facility.  Skin: Negative for itching, rash.  Neurological: positive for weakness. Positive for dizziness when changing her position. Psychiatric/Behavioral: Negative for depression.    Past Medical History  Diagnosis Date  . History of kidney stones   . Atrial fibrillation ([place]) 1/11     Dr Gwenlyn Found  . Ulcer     Gastric  . Esophageal stricture   . Hiatal hernia   . Diarrhea   . Memory loss   . Heart failure     chronic diatolic- 2 D echo 123XX123 EF 60%  . Microalbuminuria   . Anemia     Pernicious  . Diabetes mellitus     w/neuro manifestations, type II  . Chronic kidney disease     chronic, stage III  . Cystitis   . Osteoarthrosis involving multiple sites   . Swelling     limb, left more than right ankle   . Back pain   . Peptic ulcer     unspec. w/o obstruction  . Senile osteoporosis   . IBS (irritable bowel syndrome)   . Hypertension   . Tubular adenoma of colon   . GERD (gastroesophageal reflux  disease)   . Heart murmur   . Hyperlipidemia   . Thyroid disease   . Pancreatitis   . H/O cardiac catheterization 2010    normal coronary arteries, EF 60%  . Hx of echocardiogram 04/04/13    EF 55-60%, mild MR  . PAF (paroxysmal atrial fibrillation) ([place])     Last DCCV 12/29/2009  . Chronic anticoagulation     on coumadin  . Symptomatic bradycardia 01/2010    CHB, temp pacer and then Medtronic PPM  . RBBB   . Non-stress test nonreactive 11/14    Lexiscan myoview negative for ischemia  . Complete heart block ([place]) 01/31/2015    Past Surgical History  Procedure Laterality Date  . Appendectomy    . Egd- esophageal stricture      gastric ulcer  . Carotid dopplers- no significant stenosis  2007  . Tonsillectomy and adenoidectomy    . Cataract extraction      both eyes  . Pacemaker placement  2/11    medtronic dual chamber  . Cardiac catheterization  2010    normal coronary arteries   Social History:   reports that she has never smoked. She does not have any smokeless tobacco history on file. She reports that she does not drink alcohol or use illicit drugs.  Family History  Problem Relation Age of Onset  . Diabetes Mother   . Prostate cancer Father   . Heart attack Father   . Colon cancer Sister   . Esophageal cancer Neg Hx   . Rectal cancer Neg Hx   . Stomach cancer Neg Hx   . Alzheimer's disease Sister   . Heart disease Brother     Medications:   Medication List       This list is accurate as of: 03/30/16  4:16 PM.  Always use your most recent med list.               ACCU-CHEK COMPACT PLUS test strip  Generic drug:  glucose blood  Use to check blood sugar 3  times daily     AMBULATORY NON FORMULARY MEDICATION  Accu-Check Compact Plus Test Strips in Drum - Diagnosis: E10.65, Blood sugars need to be checked 3 times daily     [facility]  For bathing and dressing DX: D62, [id number]     AMBULATORY NON FORMULARY MEDICATION  Physical therapy  3 times a week for 1 week, and then 2 times a week for 8 weeks Dx:[id number] D62 l11.0     AMBULATORY NON FORMULARY MEDICATION  Medication Name: R Leg extender for wheelchair. Fax:[phone]     ascorbic acid 500 MG tablet  Commonly known as:  VITAMIN C  Take 500 mg by mouth 3 (three) times daily with meals.     b complex vitamins capsule  Take 1 capsule by mouth daily.     calcium carbonate 500 MG chewable tablet  Commonly known as:  TUMS - dosed in mg elemental calcium  Chew 1 tablet by mouth daily.      calcium citrate 950 MG tablet  Commonly known as:  CALCITRATE - dosed in mg elemental calcium  Take 950 mg by mouth daily.     CERTAVITE SENIOR/ANTIOXIDANT Tabs  Take 1 tablet by mouth daily.     EASY COMFORT INSULIN SYRINGE 30G X 5/16" 0.5 ML Misc  Generic drug:  Insulin Syringe-Needle U-100  by Does not apply route as directed.     enoxaparin  30 MG/0.3ML injection  Commonly known as:  LOVENOX  Inject into the skin every 12 (twelve) hours. Inject 0.3 MLS every 12 hours. Stop date 05/10/16     gabapentin 100 MG capsule  Commonly known as:  NEURONTIN  Take 100 mg by mouth daily. Take 2 capsules ( 200 mg) by mouth daily     gabapentin 600 MG tablet  Commonly known as:  NEURONTIN  Take 600 mg by mouth at bedtime.     gabapentin 400 MG capsule  Commonly known as:  NEURONTIN  Take 400 mg by mouth daily with lunch.     glipiZIDE 5 MG 24 hr tablet  Commonly known as:  GLUCOTROL XL  Take 1 tablet (5 mg total) by mouth daily with breakfast.     insulin aspart 100 UNIT/ML injection  Commonly known as:  novoLOG  Inject into the skin 2 (two) times daily. 150-250= 5 units, 251-300= 8 units, 301-350= 10 units, > than 350 CALL MD     insulin glargine 100 UNIT/ML injection  Commonly known as:  LANTUS  Inject 17 Units into the skin 2 (two) times daily.     iron polysaccharides 150 MG capsule  Commonly known as:  NIFEREX  Take 150 mg by mouth daily.     losartan 25 MG tablet  Commonly known as:  COZAAR  Take 25 mg by mouth daily.     memantine 5 MG tablet  Commonly known as:  NAMENDA  Take 5 mg by mouth at bedtime.     metaxalone 800 MG tablet  Commonly known as:  SKELAXIN  Take 400 mg by mouth every 4 (four) hours as needed for muscle spasms (Stop date 04/07/16).     metoprolol tartrate 25 MG tablet  Commonly known as:  LOPRESSOR  Take 1 tablet (25 mg total) by mouth 2 (two) times daily.     oxyCODONE 5 MG immediate release tablet  Commonly known as:  Oxy IR/ROXICODONE   Take 5 mg by mouth every 4 (four) hours as needed for moderate pain or severe pain (Take 1-2 tablets every 4 hours as needed for moderate to severe pain).     pantoprazole 40 MG tablet  Commonly known as:  PROTONIX  TAKE 1 TABLET (40 MG TOTAL) BY MOUTH DAILY. FOLLOW UP APPOINTMENT IS NEEDED     senna 8.6 MG tablet  Commonly known as:  SENOKOT  Take 2 tablets by mouth daily.     zinc oxide 20 % ointment  Apply 1 application topically 2 (two) times daily as needed for irritation (to external rectum and buttocks).        Immunizations: Immunization History  Administered Date(s) Administered  . H1N1 01/22/2009  . Influenza Split 09/17/2011, 10/27/2012  . Influenza Whole 10/03/2006, 10/30/2007, 10/02/2008, 12/03/2009, 10/07/2010  . Influenza,inj,Quad PF,36+ Mos 09/19/2013, 09/04/2014  . Influenza-Unspecified 09/01/2015  . PPD Test 03/29/2016  . Pneumococcal Conjugate-13 03/26/2015  . Pneumococcal Polysaccharide-23 02/25/1996  . Td 10/03/2006     Physical Exam: Filed Vitals:   03/30/16 1315  BP: 157/81  Pulse: 92  Temp: 98.9 F (37.2 C)  TempSrc: Oral  Resp: 18  SpO2: 96%    General- elderly female, frail, in no acute distress Head- normocephalic, atraumatic Nose- no maxillary or frontal sinus tenderness, no nasal discharge Throat- moist mucus membrane, poor dentition  Eyes- PERRLA, EOMI, no pallor, no icterus, no discharge, normal conjunctiva, normal sclera Neck- no cervical lymphadenopathy Cardiovascular- normal s1,s2, no murmur, + left leg edema Respiratory-  bilateral poor air entry, no wheeze, no rhonchi, no crackles, no use of accessory muscles Abdomen- bowel sounds present, soft, non tender, foley catheter in place draining urine Musculoskeletal- able to move all 4 extremities, limited right leg range of motion with brace in place, left arm edema + Neurological- alert and oriented to person, place and time Skin- warm and dry, right leg has 7 surgical incision  with dressing in place, chronic skin changes to legs, large 7 x 5 cm unstageable pressure ulcer to coccyx Psychiatry- normal mood and affect    Labs reviewed: Basic Metabolic Panel:  Recent Labs  05/14/15 1149 07/09/15 1000 03/24/16  NA  --  146* 131*  K  --  5.0 4.7  CL  --  106  --   CO2  --  28  --   GLUCOSE  --  64*  --   BUN  --  14 23*  CREATININE  --  1.29* 1.0  CALCIUM  --  9.7  --   MG 1.8  --   --    Liver Function Tests:  Recent Labs  07/09/15 1000  AST 29  ALT 20  ALKPHOS 69  BILITOT 0.6  PROT 6.4  ALBUMIN 4.0   CBC:  Recent Labs  05/14/15 1149 03/24/16  WBC 7.9 15.9  HGB 12.3 7.4*  HCT 37.9 23*  MCV 98.2  --   PLT 259 446*     Assessment/Plan  Physical deconditioning Will have her work with physical therapy and occupational therapy team to help with gait training and muscle strengthening exercises.fall precautions. Skin care. Encourage to be out of bed.      unstageable pressure ulcer To the coccyx, continue wound care, air mattress to help prevent further pressure ulcer.   Comminuted fracture of shaft of right femur S/p ORIF. Will have patient work with PT/OT as tolerated to regain strength and restore function.  Fall precautions are in place. Has follow up with orthopedics. Continue oxyIR 5 mg 1-2 tab q4h prn pain. Continue lovenox for DVT prophylaxis. Continue metaxalone 400 mg q6h prn muscle spasm. NWB to RLE.   Blood loss anemia Post op, s/p prbc transfusion. Continue iron supplement with vitamin c. Check cbc  Peripheral neuropathy Continue gabapentin 200 mg am, 400 mg noon and 600 mg at bedtime and monitor  Cough Add mucinex dm 10 cc tid x 5 days and duoneb bid x 5 days to help with her cough and monitor. If no improvement, get CXR  DM Lab Results  Component Value Date   HGBA1C 8.0 12/24/2015  continue glipizide 5 mg daily with lantus 17 u bid and SSI novlog. Monitor cbg.   HTN Elevated BP, her pain could be contributing  some. Continue losartan and lopressor. Check bp bid x 1 week  gerd Stable, continue protonix  Loose stool Discontinue senokot for now. Add probiotic daily x 2 weeks. Start senokot 2 tab daily as needed only with her on iron and pain meds  Left upper extremity edema With thrombophlebitis. Completed her clindamycin. Keep arms elevated at rest and ice pack tid x 1 week.   afib Rate controlled. Continue lopressor. Currently on lovenox. Coumadin on hold until her f/u with cardiology  ckd stage 3 Monitor bmp  Staph UTI Completed antibiotics, hydration encouraged  Urinary retention Continue foley in place. Voiding trial in 1 week. Continue foley care.  Dementia Continue supportive care. Continue namenda   Goals of care: short term rehabilitation   Labs/tests ordered:  cbc, cmp  Family/ staff Communication: reviewed care plan with patient, her daughter and nursing supervisor    Blanchie Serve, MD Internal Medicine [place], [place] [address] Cell Phone (Monday-Friday 8 am - 5 pm): [phone] On Call: [phone] and follow prompts after 5 pm and on weekends Office Phone: [phone] Office Fax: [phone]

## 2016-03-31 ENCOUNTER — Ambulatory Visit: Payer: Medicare Other | Admitting: Cardiovascular Disease

## 2016-04-01 LAB — CBC AND DIFFERENTIAL
HEMATOCRIT: 31 % — AB (ref 36–46)
Hemoglobin: 9.1 g/dL — AB (ref 12.0–16.0)
PLATELETS: 704 10*3/uL — AB (ref 150–399)
WBC: 18.6 10*3/mL

## 2016-04-01 LAB — BASIC METABOLIC PANEL
BUN: 10 mg/dL (ref 4–21)
CREATININE: 0.9 mg/dL (ref 0.5–1.1)
GLUCOSE: 15 mg/dL
Potassium: 4.8 mmol/L (ref 3.4–5.3)
Sodium: 143 mmol/L (ref 137–147)

## 2016-04-01 LAB — HEPATIC FUNCTION PANEL
ALT: 10 U/L (ref 7–35)
AST: 29 U/L (ref 13–35)
Alkaline Phosphatase: 105 U/L (ref 25–125)
BILIRUBIN, TOTAL: 0.8 mg/dL

## 2016-04-02 ENCOUNTER — Non-Acute Institutional Stay (SKILLED_NURSING_FACILITY): Payer: Medicare Other | Admitting: Family

## 2016-04-02 ENCOUNTER — Encounter: Payer: Self-pay | Admitting: Family

## 2016-04-02 DIAGNOSIS — D72829 Elevated white blood cell count, unspecified: Secondary | ICD-10-CM | POA: Diagnosis not present

## 2016-04-02 DIAGNOSIS — R05 Cough: Secondary | ICD-10-CM | POA: Diagnosis not present

## 2016-04-02 DIAGNOSIS — R059 Cough, unspecified: Secondary | ICD-10-CM

## 2016-04-02 NOTE — Progress Notes (Signed)
Location:  [facility] Room Number: [place] of Service:  SNF [phone]) Provider:  Dinah Ngetich, FNP-C  METHENEY,CATHERINE, MD  Patient Care Team: Hali Marry, MD as PCP - General (Family Medicine) Lorretta Harp, MD as Attending Physician (Cardiology) Elwyn Lade, MD as Referring Physician (Nephrology)  Extended Emergency Contact Information Primary Emergency Contact: [facility] Address: [address]          [place], [place] [address] [name] of [place] Phone: [phone] Mobile Phone: [phone] Relation: Daughter  Code Status: Full Code  Goals of care: Advanced Directive information No flowsheet data found.   Chief Complaint  Patient presents with  . Acute Visit    Abnormal labs    HPI:  Pt is a 82 y.o. female seen today at [facility]  for an acute visit for evaluation of abnormal lab results. She has a medical history of HTN, TIA, CHF, Hypothyroidism, IBS, CKD among others. She is seen in her room today with her daughter at bedside. She complains of non-productive cough. She states felt warm early prior to visit had to turn the [facility] on.She denies any fever or chills. She has used Robitussin DM and Nebulizer treatments but cough persist. Patient's daughter states patient was able to eat a little bit today but did not finish her lunch. Her recent lab results showed WBC 18.6,Neutrophils 12.9, Hgb 9.1/31.2 ( 04/01/2016)   Past Medical History  Diagnosis Date  . History of kidney stones   . Atrial fibrillation ([place]) 1/11     Dr Gwenlyn Found  . Ulcer     Gastric  . Esophageal stricture   . Hiatal hernia   . Diarrhea   . Memory loss   . Heart failure     chronic diatolic- 2 D echo 123XX123 EF 60%  . Microalbuminuria   . Anemia     Pernicious  . Diabetes mellitus     w/neuro manifestations, type II  . Chronic kidney disease     chronic, stage III  . Cystitis   . Osteoarthrosis involving multiple  sites   . Swelling     limb, left more than right ankle   . Back pain   . Peptic ulcer     unspec. w/o obstruction  . Senile osteoporosis   . IBS (irritable bowel syndrome)   . Hypertension   . Tubular adenoma of colon   . GERD (gastroesophageal reflux disease)   . Heart murmur   . Hyperlipidemia   . Thyroid disease   . Pancreatitis   . H/O cardiac catheterization 2010    normal coronary arteries, EF 60%  . Hx of echocardiogram 04/04/13    EF 55-60%, mild MR  . PAF (paroxysmal atrial fibrillation) ([place])     Last DCCV 12/29/2009  . Chronic anticoagulation     on coumadin  . Symptomatic bradycardia 01/2010    CHB, temp pacer and then Medtronic PPM  . RBBB   . Non-stress test nonreactive 11/14    Lexiscan myoview negative for ischemia  . Complete heart block ([place]) 01/31/2015   Past Surgical History  Procedure Laterality Date  . Appendectomy    . Egd- esophageal stricture      gastric ulcer  . Carotid dopplers- no significant stenosis  2007  . Tonsillectomy and adenoidectomy    . Cataract extraction      both eyes  . Pacemaker placement  2/11    medtronic dual chamber  .  Cardiac catheterization  2010    normal coronary arteries    Allergies  Allergen Reactions  . Ace Inhibitors     REACTION: cough  . Angiotensin Receptor Blockers Other (See Comments)    Renal dx.  Nephrology has recommended against use bc of hyperkalemia  . Fosinopril Sodium     REACTION: Cough  . Sulfamethoxazole-Trimethoprim     REACTION: Unspecified      Medication List       This list is accurate as of: 04/02/16  2:02 PM.  Always use your most recent med list.               ascorbic acid 500 MG tablet  Commonly known as:  VITAMIN C  Take 500 mg by mouth 3 (three) times daily with meals.     b complex vitamins capsule  Take 1 capsule by mouth daily.     calcium carbonate 500 MG chewable tablet  Commonly known as:  TUMS - dosed in mg elemental calcium  Chew 1 tablet by mouth daily.       calcium citrate 950 MG tablet  Commonly known as:  CALCITRATE - dosed in mg elemental calcium  Take 950 mg by mouth daily.     CERTAVITE SENIOR/ANTIOXIDANT Tabs  Take 1 tablet by mouth daily.     EASY COMFORT INSULIN SYRINGE 30G X 5/16" 0.5 ML Misc  Generic drug:  Insulin Syringe-Needle U-100  by Does not apply route as directed.     enoxaparin 30 MG/0.3ML injection  Commonly known as:  LOVENOX  Inject into the skin every 12 (twelve) hours. Inject 0.3 MLS every 12 hours. Stop date 05/10/16     gabapentin 100 MG capsule  Commonly known as:  NEURONTIN  Take 100 mg by mouth daily. Take 2 capsules ( 200 mg) by mouth daily     gabapentin 600 MG tablet  Commonly known as:  NEURONTIN  Take 600 mg by mouth at bedtime.     gabapentin 400 MG capsule  Commonly known as:  NEURONTIN  Take 400 mg by mouth daily with lunch.     glipiZIDE 5 MG 24 hr tablet  Commonly known as:  GLUCOTROL XL  Take 1 tablet (5 mg total) by mouth daily with breakfast.     Dextromethorphan-Guaifenesin 5-100 MG/5ML Liqd  Take 10 mLs by mouth 3 (three) times daily.     guaiFENesin-dextromethorphan 100-10 MG/5ML syrup  Commonly known as:  ROBITUSSIN DM  Take 10 mLs by mouth 3 (three) times daily.     insulin glargine 100 UNIT/ML injection  Commonly known as:  LANTUS  Inject 17 Units into the skin 2 (two) times daily.     ipratropium-albuterol 0.5-2.5 (3) MG/3ML Soln  Commonly known as:  DUONEB  Take 3 mLs by nebulization 2 (two) times daily.     iron polysaccharides 150 MG capsule  Commonly known as:  NIFEREX  Take 150 mg by mouth daily.     losartan 25 MG tablet  Commonly known as:  COZAAR  Take 25 mg by mouth daily.     memantine 5 MG tablet  Commonly known as:  NAMENDA  Take 5 mg by mouth at bedtime.     metaxalone 800 MG tablet  Commonly known as:  SKELAXIN  Take 400 mg by mouth every 4 (four) hours as needed for muscle spasms (Stop date 04/07/16).     metoprolol 50 MG tablet  Commonly  known as:  LOPRESSOR  Half tablet twice  a day     oxyCODONE 5 MG immediate release tablet  Commonly known as:  Oxy IR/ROXICODONE  Take 5 mg by mouth every 4 (four) hours as needed for moderate pain or severe pain (Take 1-2 tablets every 4 hours as needed for moderate to severe pain).     pantoprazole 40 MG tablet  Commonly known as:  PROTONIX  TAKE 1 TABLET (40 MG TOTAL) BY MOUTH DAILY. FOLLOW UP APPOINTMENT IS NEEDED     saccharomyces boulardii 250 MG capsule  Commonly known as:  FLORASTOR  Take 250 mg by mouth 2 (two) times daily.     sennosides-docusate sodium 8.6-50 MG tablet  Commonly known as:  SENOKOT-S  Take 2 tablets by mouth daily. Hold for diarrhea     zinc oxide 20 % ointment  Apply 1 application topically 2 (two) times daily as needed for irritation (to external rectum and buttocks).        Review of Systems  Constitutional: Negative for fever, chills, activity change, appetite change and fatigue.  HENT: Negative for congestion, rhinorrhea, sinus pressure, sneezing and sore throat.   Eyes: Negative.   Respiratory: Negative for chest tightness, shortness of breath and wheezing.        Non-productive cough   Cardiovascular: Negative for chest pain, palpitations and leg swelling.  Gastrointestinal: Negative for nausea, abdominal pain, diarrhea, constipation and abdominal distention.  Genitourinary: Negative for flank pain.       Foley Catheter   Skin: Negative.   Psychiatric/Behavioral: Negative for hallucinations, confusion and agitation. The patient is not nervous/anxious.     Immunization History  Administered Date(s) Administered  . H1N1 01/22/2009  . Influenza Split 09/17/2011, 10/27/2012  . Influenza Whole 10/03/2006, 10/30/2007, 10/02/2008, 12/03/2009, 10/07/2010  . Influenza,inj,Quad PF,36+ Mos 09/19/2013, 09/04/2014  . Influenza-Unspecified 09/01/2015  . PPD Test 03/29/2016  . Pneumococcal Conjugate-13 03/26/2015  . Pneumococcal Polysaccharide-23  02/25/1996  . Td 10/03/2006   Pertinent  Health Maintenance Due  Topic Date Due  . HEMOGLOBIN A1C  06/23/2016  . INFLUENZA VACCINE  07/27/2016  . OPHTHALMOLOGY EXAM  08/03/2016  . FOOT EXAM  12/23/2016  . DEXA SCAN  Completed  . PNA vac Low Risk Adult  Completed   Fall Risk  09/04/2014 09/04/2014 09/04/2014 08/15/2013 08/15/2013  Falls in the past year? No No No No No   Functional Status Survey:    Filed Vitals:   04/02/16 1034  BP: 144/61  Pulse: 89  Temp: 98.8 F (37.1 C)  Resp: 21  SpO2: 99%   There is no weight on file to calculate BMI. Physical Exam  Constitutional: She is oriented to person, place, and time. She appears well-developed and well-nourished. No distress.  HENT:  Head: Normocephalic.  Mouth/Throat: Oropharynx is clear and moist.  Eyes: Conjunctivae and EOM are normal. Pupils are equal, round, and reactive to light. Right eye exhibits no discharge. Left eye exhibits no discharge. No scleral icterus.  Neck: Normal range of motion. No JVD present.  Cardiovascular: Normal rate, regular rhythm, normal heart sounds and intact distal pulses.   Pulmonary/Chest: Effort normal. No respiratory distress. She has no wheezes.  RUL rales to Auscultation.   Abdominal: Soft. Bowel sounds are normal. She exhibits no distension and no mass. There is no tenderness. There is no rebound and no guarding.  Genitourinary:  Foley catheter draining amber color urine. Small amounts noted on Bag. Emptied by facility NA prior to visit.   Musculoskeletal: She exhibits no edema or tenderness.  Lymphadenopathy:  She has no cervical adenopathy.  Neurological: She is oriented to person, place, and time.  Skin: Skin is warm and dry. No rash noted. No erythema. No pallor.  Right hip/leg DRSG dry clean and intact. Surround area without any redness, swelling or tenderness to touch.    Psychiatric: She has a normal mood and affect.    Labs reviewed:  Recent Labs  05/14/15 1149  07/09/15 1000 03/24/16 04/01/16  NA  --  146* 131* 143  K  --  5.0 4.7 4.8  CL  --  106  --   --   CO2  --  28  --   --   GLUCOSE  --  64*  --   --   BUN  --  14 23* 10  CREATININE  --  1.29* 1.0 0.9  CALCIUM  --  9.7  --   --   MG 1.8  --   --   --     Recent Labs  07/09/15 1000 04/01/16  AST 29 29  ALT 20 10  ALKPHOS 69 105  BILITOT 0.6  --   PROT 6.4  --   ALBUMIN 4.0  --     Recent Labs  05/14/15 1149 03/24/16 04/01/16  WBC 7.9 15.9 18.6  HGB 12.3 7.4* 9.1*  HCT 37.9 23* 31*  MCV 98.2  --   --   PLT 259 446* 704*   Lab Results  Component Value Date   TSH 0.858 05/14/2015   Lab Results  Component Value Date   HGBA1C 8.0 12/24/2015   Lab Results  Component Value Date   CHOL 128 07/09/2015   HDL 50 07/09/2015   LDLCALC 53 07/09/2015   LDLDIRECT 73 08/07/2009   TRIG 125 07/09/2015   CHOLHDL 2.6 07/09/2015    Significant Diagnostic Results in last 30 days:  No results found.  Assessment/Plan 1.Cough  Non-productive. Afebrile.Exam findings negative for edema, shortness of breath, wheezing. RUL rales.Continue with Robitussin DM, Duoneb treatment. Portable CXR 2 views stat   2. Leukocytosis  Afebrile. Recent lab results showed WBC 18.6,Neutrophils 12.9. Exam findings negative except for RUL rales to Auscultation. Portable CXR 2 views  Stat. Urine specimen for U/A and C/S   Family/ staff Communication: Reviewed plan of care with patient, Patient's daughter and facility Nurse supervisor.   Labs/tests ordered: Portable CXR 2 views stat.Urine specimen for U/A and C/S 04/02/2016. Recheck CBC/diff 04/05/2016

## 2016-04-07 DIAGNOSIS — M79604 Pain in right leg: Secondary | ICD-10-CM | POA: Diagnosis not present

## 2016-04-07 DIAGNOSIS — S72491K Other fracture of lower end of right femur, subsequent encounter for closed fracture with nonunion: Secondary | ICD-10-CM | POA: Diagnosis not present

## 2016-04-12 ENCOUNTER — Other Ambulatory Visit: Payer: Self-pay | Admitting: Family Medicine

## 2016-04-15 ENCOUNTER — Encounter: Payer: Self-pay | Admitting: Family

## 2016-04-15 ENCOUNTER — Non-Acute Institutional Stay (SKILLED_NURSING_FACILITY): Payer: Medicare Other | Admitting: Family

## 2016-04-15 DIAGNOSIS — K589 Irritable bowel syndrome without diarrhea: Secondary | ICD-10-CM

## 2016-04-15 DIAGNOSIS — N183 Chronic kidney disease, stage 3 unspecified: Secondary | ICD-10-CM

## 2016-04-15 DIAGNOSIS — Z7901 Long term (current) use of anticoagulants: Secondary | ICD-10-CM

## 2016-04-15 DIAGNOSIS — I110 Hypertensive heart disease with heart failure: Secondary | ICD-10-CM | POA: Diagnosis not present

## 2016-04-15 DIAGNOSIS — S72409D Unspecified fracture of lower end of unspecified femur, subsequent encounter for closed fracture with routine healing: Secondary | ICD-10-CM | POA: Diagnosis not present

## 2016-04-15 DIAGNOSIS — I480 Paroxysmal atrial fibrillation: Secondary | ICD-10-CM

## 2016-04-15 DIAGNOSIS — E1143 Type 2 diabetes mellitus with diabetic autonomic (poly)neuropathy: Secondary | ICD-10-CM

## 2016-04-15 DIAGNOSIS — E039 Hypothyroidism, unspecified: Secondary | ICD-10-CM

## 2016-04-15 DIAGNOSIS — E1142 Type 2 diabetes mellitus with diabetic polyneuropathy: Secondary | ICD-10-CM | POA: Diagnosis not present

## 2016-04-15 DIAGNOSIS — Z794 Long term (current) use of insulin: Secondary | ICD-10-CM

## 2016-04-15 DIAGNOSIS — K219 Gastro-esophageal reflux disease without esophagitis: Secondary | ICD-10-CM | POA: Diagnosis not present

## 2016-04-15 NOTE — Progress Notes (Signed)
Patient ID: [name], female   DOB: 24-Oct-1928, 82 y.o.   MRN: [id number]  Location:  [facility] Room Number: [id number] Place of Service:  SNF (31)  Provider: Marlowe Sax, FNP-C Blanchie Serve, MD   PCP: Beatrice Lecher, MD Patient Care Team: Hali Marry, MD as PCP - General (Family Medicine) Lorretta Harp, MD as Attending Physician (Cardiology) Elwyn Lade, MD as Referring Physician (Nephrology)  Extended Emergency Contact Information Primary Emergency Contact: [facility] Address: [address]          [place], [place] [address] [name] of [place] Phone: [phone] Mobile Phone: [phone] Relation: Daughter  Code Status: Full Code  Goals of care:  Advanced Directive information No flowsheet data found.   Allergies  Allergen Reactions  . Ace Inhibitors     REACTION: cough  . Angiotensin Receptor Blockers Other (See Comments)    Renal dx.  Nephrology has recommended against use bc of hyperkalemia  . Fosinopril Sodium     REACTION: Cough  . Sulfamethoxazole-Trimethoprim     REACTION: Unspecified    Chief Complaint  Patient presents with  . Discharge Note    Discharge from SNF     HPI:  82 y.o. female seen today at  [facility] for discharge home. 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 She underwent ORIF and removal of deep buried hardware consisting of previously placed right sided retrograde femoral nail. She had acute blood loss anemia and received 4 units PRBC transfusion. She was treated for staphylococcus UTI. She also had LUE thrombophlebitis and was treated with clindamycin. She is has a medical history of HTN, DM, CKD, afib, Hypothyroidism, Neuropathy among others. She is seen in her room today. She states right hip and knee pain under controlled  with current pain regimen. She is NWB to right leg. She has worked with PT/OT now stable for discharge home to continue with ROM, exercise, Gait stability and muscle strengthening. She is currently on Lovenox SQ injection for until seen by Cardiologist [facility]. She will require a [place] Aid to assist with ADL's. [place] RN for medication management and coumadin INR check when resumed. No DME required has own walker.      Past Medical History  Diagnosis Date  . History of kidney stones   . Atrial fibrillation ([place]) 1/11     Dr Gwenlyn Found  . Ulcer     Gastric  . Esophageal stricture   . Hiatal hernia   . Diarrhea   . Memory loss   . Heart failure     chronic diatolic- 2 D echo 123XX123 EF 60%  . Microalbuminuria   . Anemia     Pernicious  . Diabetes mellitus     w/neuro manifestations, type II  . Chronic kidney disease     chronic, stage III  . Cystitis   . Osteoarthrosis involving multiple sites   . Swelling     limb, left more than right ankle   . Back pain   . Peptic ulcer     unspec. w/o obstruction  . Senile osteoporosis   . IBS (irritable bowel syndrome)   . Hypertension   . Tubular adenoma of colon   . GERD (gastroesophageal reflux disease)   . Heart murmur   . Hyperlipidemia   . Thyroid disease   . Pancreatitis   .  H/O cardiac catheterization 2010    normal coronary arteries, EF 60%  . Hx of echocardiogram 04/04/13    EF 55-60%, mild MR  . PAF (paroxysmal atrial fibrillation) ([place])     Last DCCV 12/29/2009  . Chronic anticoagulation     on coumadin  . Symptomatic bradycardia 01/2010    CHB, temp pacer and then Medtronic PPM  . RBBB   . Non-stress test nonreactive 11/14    Lexiscan myoview negative for ischemia  . Complete heart block ([place]) 01/31/2015    Past Surgical History  Procedure Laterality Date  . Appendectomy    . Egd- esophageal stricture      gastric ulcer  . Carotid dopplers- no significant stenosis  2007  . Tonsillectomy and adenoidectomy    . Cataract  extraction      both eyes  . Pacemaker placement  2/11    medtronic dual chamber  . Cardiac catheterization  2010    normal coronary arteries      reports that she has never smoked. She does not have any smokeless tobacco history on file. She reports that she does not drink alcohol or use illicit drugs. Social History   Social History  . Marital Status: Widowed    Spouse Name: N/A  . Number of Children: N/A  . Years of Education: N/A   Occupational History  . Not on file.   Social History Main Topics  . Smoking status: Never Smoker   . Smokeless tobacco: Not on file  . Alcohol Use: No  . Drug Use: No  . Sexual Activity: No   Other Topics Concern  . Not on file   Social History Narrative   Functional Status Survey:    Allergies  Allergen Reactions  . Ace Inhibitors     REACTION: cough  . Angiotensin Receptor Blockers Other (See Comments)    Renal dx.  Nephrology has recommended against use bc of hyperkalemia  . Fosinopril Sodium     REACTION: Cough  . Sulfamethoxazole-Trimethoprim     REACTION: Unspecified    Pertinent  Health Maintenance Due  Topic Date Due  . HEMOGLOBIN A1C  06/23/2016  . INFLUENZA VACCINE  07/27/2016  . OPHTHALMOLOGY EXAM  08/03/2016  . FOOT EXAM  12/23/2016  . DEXA SCAN  Completed  . PNA vac Low Risk Adult  Completed    Medications:   Medication List       This list is accurate as of: 04/15/16  1:39 PM.  Always use your most recent med list.               ascorbic acid 500 MG tablet  Commonly known as:  VITAMIN C  Take 500 mg by mouth 3 (three) times daily with meals.     b complex vitamins capsule  Take 1 capsule by mouth daily.     calcium carbonate 500 MG chewable tablet  Commonly known as:  TUMS - dosed in mg elemental calcium  Chew 1 tablet by mouth daily.     calcium citrate 950 MG tablet  Commonly known as:  CALCITRATE - dosed in mg elemental calcium  Take 950 mg by mouth daily.     CERTAVITE  SENIOR/ANTIOXIDANT Tabs  Take 1 tablet by mouth daily.     EASY COMFORT INSULIN SYRINGE 30G X 5/16" 0.5 ML Misc  Generic drug:  Insulin Syringe-Needle U-100  by Does not apply route as directed. Reported on 04/15/2016     enoxaparin 30 MG/0.3ML injection  Commonly known as:  LOVENOX  Inject into the skin every 12 (twelve) hours. Inject 0.3 MLS every 12 hours. Stop date 05/10/16     FLORASTOR 250 MG capsule  Generic drug:  saccharomyces boulardii  Take 250 mg by mouth daily.     gabapentin 100 MG capsule  Commonly known as:  NEURONTIN  Take 2 capsules ( 200 mg) by mouth daily     gabapentin 600 MG tablet  Commonly known as:  NEURONTIN  Take 600 mg by mouth at bedtime.     gabapentin 400 MG capsule  Commonly known as:  NEURONTIN  Take 400 mg by mouth daily with lunch.     glipiZIDE 5 MG 24 hr tablet  Commonly known as:  GLUCOTROL XL  Take 1 tablet (5 mg total) by mouth daily with breakfast.     glucose blood test strip  Commonly known as:  ACCU-CHEK COMPACT PLUS  Check blood sugars 3 times daily Dx: E11.9     insulin glargine 100 UNIT/ML injection  Commonly known as:  LANTUS  Inject 17 Units into the skin 2 (two) times daily.     ipratropium-albuterol 0.5-2.5 (3) MG/3ML Soln  Commonly known as:  DUONEB  Take 3 mLs by nebulization 2 (two) times daily.     iron polysaccharides 150 MG capsule  Commonly known as:  NIFEREX  Take 150 mg by mouth daily.     losartan 25 MG tablet  Commonly known as:  COZAAR  Take 25 mg by mouth daily.     memantine 5 MG tablet  Commonly known as:  NAMENDA  Take 5 mg by mouth at bedtime.     metoprolol 50 MG tablet  Commonly known as:  LOPRESSOR  Half tablet twice a day     oxyCODONE 5 MG immediate release tablet  Commonly known as:  Oxy IR/ROXICODONE  1 by mouth every 4 hours as needed from as needed for mild to moderate pain, 2 by mouth every 4 hours as needed for severepain     pantoprazole 40 MG tablet  Commonly known as:   PROTONIX  TAKE 1 TABLET (40 MG TOTAL) BY MOUTH DAILY. FOLLOW UP APPOINTMENT IS NEEDED     sennosides-docusate sodium 8.6-50 MG tablet  Commonly known as:  SENOKOT-S  Take 2 tablets by mouth daily as needed for constipation. Hold for diarrhea     zinc oxide 20 % ointment  Apply 1 application topically 2 (two) times daily as needed for irritation (to external rectum and buttocks).        Review of Systems  Constitutional: Negative for fever, chills, activity change and fatigue.  HENT: Negative.   Eyes: Negative.   Respiratory: Negative for cough, chest tightness, shortness of breath and wheezing.   Cardiovascular: Negative for chest pain and palpitations.  Gastrointestinal: Negative.   Endocrine: Negative.   Genitourinary: Negative.   Musculoskeletal: Positive for gait problem.       Right leg NWB   Skin: Negative.        Right knee incision   Neurological: Negative.   Psychiatric/Behavioral: Negative.     Filed Vitals:   04/15/16 1316  BP: 146/70  Pulse: 80  Temp: 97.2 F (36.2 C)  Resp: 20  SpO2: 97%   There is no weight on file to calculate BMI. Physical Exam  Constitutional: She is oriented to person, place, and time. She appears well-developed and well-nourished. No distress.  HENT:  Head: Normocephalic.  Mouth/Throat: Oropharynx is clear and  moist.  Eyes: Conjunctivae and EOM are normal. Pupils are equal, round, and reactive to light. Right eye exhibits no discharge. Left eye exhibits no discharge. No scleral icterus.  Neck: Normal range of motion. No JVD present. No thyromegaly present.  Cardiovascular: Exam reveals no gallop and no friction rub.   No murmur heard. Pacemaker   Pulmonary/Chest: Effort normal and breath sounds normal. No respiratory distress. She has no wheezes. She has no rales.  Abdominal: Soft. She exhibits no distension and no mass. There is no tenderness. There is no rebound and no guarding.  Musculoskeletal: She exhibits no edema or  tenderness.  Right leg NWB  Lymphadenopathy:    She has no cervical adenopathy.  Neurological: She is oriented to person, place, and time.  Skin: Skin is warm and dry. No rash noted. She is not diaphoretic. No erythema. No pallor.  Surgical incision dry, clean and intact. No signs of redness, swelling or drainage noted.   Psychiatric: She has a normal mood and affect.    Labs reviewed: Basic Metabolic Panel:  Recent Labs  05/14/15 1149 07/09/15 1000 03/24/16 04/01/16  NA  --  146* 131* 143  K  --  5.0 4.7 4.8  CL  --  106  --   --   CO2  --  28  --   --   GLUCOSE  --  64*  --   --   BUN  --  14 23* 10  CREATININE  --  1.29* 1.0 0.9  CALCIUM  --  9.7  --   --   MG 1.8  --   --   --    Liver Function Tests:  Recent Labs  07/09/15 1000 04/01/16  AST 29 29  ALT 20 10  ALKPHOS 69 105  BILITOT 0.6  --   PROT 6.4  --   ALBUMIN 4.0  --    No results for input(s): LIPASE, AMYLASE in the last 8760 hours. No results for input(s): AMMONIA in the last 8760 hours. CBC:  Recent Labs  05/14/15 1149 03/24/16 04/01/16  WBC 7.9 15.9 18.6  HGB 12.3 7.4* 9.1*  HCT 37.9 23* 31*  MCV 98.2  --   --   PLT 259 446* 704*   Cardiac Enzymes: No results for input(s): CKTOTAL, CKMB, CKMBINDEX, TROPONINI in the last 8760 hours. BNP: Invalid input(s): POCBNP CBG: No results for input(s): GLUCAP in the last 8760 hours.  Procedures and Imaging Studies During Stay: No results found.   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 She underwent ORIF and removal of deep buried hardware consisting of previously placed right sided retrograde femoral nail. NWB to right leg. Continue oxycodone 5 mg IR 1-2 tabs Q4 Hrs PRN. Har script for one month supply written. Continue Lovenox until 05/10/2016.Self  administration education will be provided prior to discharge. Follow with ortho Dr. Kayleen Memos 05/12/2016 at 1:45 pm   CKD stage 3  Avoid nephrotoxins and dose all other meds for renal clearance. PCP to monitor BMP  HTN B/p stable. Continue on metoprolol. PCP to  Monitor BMP  Afib Rate controlled. Continue on metoprolol and Lovenox. Coumadin on hold until follow up with Cardiologist.   GERD Asymptomatic. Continue on Protonix.   IBS Stable. Continue current stool softeners.   Type  2 DM Recent Hgb A1C 8.0 ( 12/24/2015) Testing CBG's twice daily averages 100's-200's. Continue on Humalog SSI 5 units -10 units with meals, Lantus 17 units bid and Glipizide 5 mg Tablet .Follow up with PCP to monitor Hgb A1C.  Diabetic Neuropathy Continue Gabapentin 200 mg in am, 400 mg noon and 600 mg Capsule at bedtime.   Long Term use of anticoagulants Coumadin current on hold until seen by Cardiologist. Continue on Lovenox SQ. [place] RN for medication management and INR check when coumadin is restarted.   Patient is being discharged with the following home health services:    PT /OT for ROM, exercise, Gait stability and muscle strengthening.   [place] Aid to assist with ADL's.  [place] RN for medication management and coumadin INR check when resumed. She is currently on Lovenox SQ injection for until seen by Cardiologist [facility].  Patient is being discharged with the following durable medical equipment:     No DME required has own walker.  Rx written  X 1 month supply   Patient has been advised to f/u with their PCP in 1-2 weeks to bring them up to date on their rehab stay.  Social services at facility was responsible for arranging this appointment.  Pt was provided with a 30 day supply of prescriptions for medications and refills must be obtained from their PCP.  For controlled substances, a more limited supply may be provided adequate until PCP appointment only.  Future labs/tests needed: CBC, BMP, Hgb A1C  with PCP

## 2016-04-16 ENCOUNTER — Other Ambulatory Visit: Payer: Self-pay | Admitting: Family Medicine

## 2016-04-18 DIAGNOSIS — I509 Heart failure, unspecified: Secondary | ICD-10-CM | POA: Diagnosis not present

## 2016-04-18 DIAGNOSIS — L89153 Pressure ulcer of sacral region, stage 3: Secondary | ICD-10-CM | POA: Diagnosis not present

## 2016-04-18 DIAGNOSIS — S72301K Unspecified fracture of shaft of right femur, subsequent encounter for closed fracture with nonunion: Secondary | ICD-10-CM | POA: Diagnosis not present

## 2016-04-18 DIAGNOSIS — M869 Osteomyelitis, unspecified: Secondary | ICD-10-CM | POA: Diagnosis not present

## 2016-04-18 DIAGNOSIS — R531 Weakness: Secondary | ICD-10-CM | POA: Diagnosis not present

## 2016-04-18 DIAGNOSIS — Z9181 History of falling: Secondary | ICD-10-CM | POA: Diagnosis not present

## 2016-04-18 DIAGNOSIS — I110 Hypertensive heart disease with heart failure: Secondary | ICD-10-CM | POA: Diagnosis not present

## 2016-04-18 DIAGNOSIS — R2689 Other abnormalities of gait and mobility: Secondary | ICD-10-CM | POA: Diagnosis not present

## 2016-04-18 DIAGNOSIS — E119 Type 2 diabetes mellitus without complications: Secondary | ICD-10-CM | POA: Diagnosis not present

## 2016-04-18 DIAGNOSIS — Z96651 Presence of right artificial knee joint: Secondary | ICD-10-CM | POA: Diagnosis not present

## 2016-04-18 DIAGNOSIS — Z5181 Encounter for therapeutic drug level monitoring: Secondary | ICD-10-CM | POA: Diagnosis not present

## 2016-04-18 DIAGNOSIS — Z792 Long term (current) use of antibiotics: Secondary | ICD-10-CM | POA: Diagnosis not present

## 2016-04-18 DIAGNOSIS — T8451XD Infection and inflammatory reaction due to internal right hip prosthesis, subsequent encounter: Secondary | ICD-10-CM | POA: Diagnosis not present

## 2016-04-18 DIAGNOSIS — Z452 Encounter for adjustment and management of vascular access device: Secondary | ICD-10-CM | POA: Diagnosis not present

## 2016-04-18 DIAGNOSIS — Z7901 Long term (current) use of anticoagulants: Secondary | ICD-10-CM | POA: Diagnosis not present

## 2016-04-21 DIAGNOSIS — R2689 Other abnormalities of gait and mobility: Secondary | ICD-10-CM | POA: Diagnosis not present

## 2016-04-21 DIAGNOSIS — Z794 Long term (current) use of insulin: Secondary | ICD-10-CM | POA: Diagnosis not present

## 2016-04-21 DIAGNOSIS — L89153 Pressure ulcer of sacral region, stage 3: Secondary | ICD-10-CM | POA: Diagnosis not present

## 2016-04-21 DIAGNOSIS — Z96651 Presence of right artificial knee joint: Secondary | ICD-10-CM | POA: Diagnosis not present

## 2016-04-21 DIAGNOSIS — Z7901 Long term (current) use of anticoagulants: Secondary | ICD-10-CM | POA: Diagnosis not present

## 2016-04-21 DIAGNOSIS — Z4800 Encounter for change or removal of nonsurgical wound dressing: Secondary | ICD-10-CM | POA: Diagnosis not present

## 2016-04-21 DIAGNOSIS — I110 Hypertensive heart disease with heart failure: Secondary | ICD-10-CM | POA: Diagnosis not present

## 2016-04-21 DIAGNOSIS — R531 Weakness: Secondary | ICD-10-CM | POA: Diagnosis not present

## 2016-04-21 DIAGNOSIS — I509 Heart failure, unspecified: Secondary | ICD-10-CM | POA: Diagnosis not present

## 2016-04-21 DIAGNOSIS — Z5181 Encounter for therapeutic drug level monitoring: Secondary | ICD-10-CM | POA: Diagnosis not present

## 2016-04-21 DIAGNOSIS — E119 Type 2 diabetes mellitus without complications: Secondary | ICD-10-CM | POA: Diagnosis not present

## 2016-04-21 DIAGNOSIS — Z9181 History of falling: Secondary | ICD-10-CM | POA: Diagnosis not present

## 2016-04-21 DIAGNOSIS — S72301K Unspecified fracture of shaft of right femur, subsequent encounter for closed fracture with nonunion: Secondary | ICD-10-CM | POA: Diagnosis not present

## 2016-04-23 ENCOUNTER — Telehealth: Payer: Self-pay

## 2016-04-23 ENCOUNTER — Ambulatory Visit: Payer: Medicare Other | Admitting: Cardiovascular Disease

## 2016-04-23 DIAGNOSIS — Z96651 Presence of right artificial knee joint: Secondary | ICD-10-CM | POA: Diagnosis not present

## 2016-04-23 DIAGNOSIS — R2689 Other abnormalities of gait and mobility: Secondary | ICD-10-CM | POA: Diagnosis not present

## 2016-04-23 DIAGNOSIS — Z5181 Encounter for therapeutic drug level monitoring: Secondary | ICD-10-CM | POA: Diagnosis not present

## 2016-04-23 DIAGNOSIS — S72301K Unspecified fracture of shaft of right femur, subsequent encounter for closed fracture with nonunion: Secondary | ICD-10-CM | POA: Diagnosis not present

## 2016-04-23 DIAGNOSIS — Z7901 Long term (current) use of anticoagulants: Secondary | ICD-10-CM | POA: Diagnosis not present

## 2016-04-23 DIAGNOSIS — E119 Type 2 diabetes mellitus without complications: Secondary | ICD-10-CM | POA: Diagnosis not present

## 2016-04-23 DIAGNOSIS — I110 Hypertensive heart disease with heart failure: Secondary | ICD-10-CM | POA: Diagnosis not present

## 2016-04-23 DIAGNOSIS — Z4800 Encounter for change or removal of nonsurgical wound dressing: Secondary | ICD-10-CM | POA: Diagnosis not present

## 2016-04-23 DIAGNOSIS — Z794 Long term (current) use of insulin: Secondary | ICD-10-CM | POA: Diagnosis not present

## 2016-04-23 DIAGNOSIS — Z9181 History of falling: Secondary | ICD-10-CM | POA: Diagnosis not present

## 2016-04-23 DIAGNOSIS — L89153 Pressure ulcer of sacral region, stage 3: Secondary | ICD-10-CM | POA: Diagnosis not present

## 2016-04-23 DIAGNOSIS — I509 Heart failure, unspecified: Secondary | ICD-10-CM | POA: Diagnosis not present

## 2016-04-23 DIAGNOSIS — R531 Weakness: Secondary | ICD-10-CM | POA: Diagnosis not present

## 2016-04-23 MED ORDER — COLLAGENASE 250 UNIT/GM EX OINT: 1.0000 "application " | TOPICAL_OINTMENT | Freq: Every day | CUTANEOUS | Status: DC

## 2016-04-23 NOTE — Telephone Encounter (Signed)
Notified home health

## 2016-04-23 NOTE — Telephone Encounter (Signed)
[facility]. She should be able to pick it up today.

## 2016-04-24 ENCOUNTER — Other Ambulatory Visit: Payer: Self-pay | Admitting: Family Medicine

## 2016-04-26 ENCOUNTER — Telehealth: Payer: Self-pay

## 2016-04-26 DIAGNOSIS — I110 Hypertensive heart disease with heart failure: Secondary | ICD-10-CM | POA: Diagnosis not present

## 2016-04-26 DIAGNOSIS — Z9181 History of falling: Secondary | ICD-10-CM | POA: Diagnosis not present

## 2016-04-26 DIAGNOSIS — S72301K Unspecified fracture of shaft of right femur, subsequent encounter for closed fracture with nonunion: Secondary | ICD-10-CM | POA: Diagnosis not present

## 2016-04-26 DIAGNOSIS — Z5181 Encounter for therapeutic drug level monitoring: Secondary | ICD-10-CM | POA: Diagnosis not present

## 2016-04-26 DIAGNOSIS — R531 Weakness: Secondary | ICD-10-CM | POA: Diagnosis not present

## 2016-04-26 DIAGNOSIS — Z4800 Encounter for change or removal of nonsurgical wound dressing: Secondary | ICD-10-CM | POA: Diagnosis not present

## 2016-04-26 DIAGNOSIS — Z794 Long term (current) use of insulin: Secondary | ICD-10-CM | POA: Diagnosis not present

## 2016-04-26 DIAGNOSIS — Z96651 Presence of right artificial knee joint: Secondary | ICD-10-CM | POA: Diagnosis not present

## 2016-04-26 DIAGNOSIS — E119 Type 2 diabetes mellitus without complications: Secondary | ICD-10-CM | POA: Diagnosis not present

## 2016-04-26 DIAGNOSIS — L89153 Pressure ulcer of sacral region, stage 3: Secondary | ICD-10-CM | POA: Diagnosis not present

## 2016-04-26 DIAGNOSIS — Z7901 Long term (current) use of anticoagulants: Secondary | ICD-10-CM | POA: Diagnosis not present

## 2016-04-26 DIAGNOSIS — R2689 Other abnormalities of gait and mobility: Secondary | ICD-10-CM | POA: Diagnosis not present

## 2016-04-26 DIAGNOSIS — I509 Heart failure, unspecified: Secondary | ICD-10-CM | POA: Diagnosis not present

## 2016-04-26 MED ORDER — NYSTATIN 100000 UNIT/GM EX POWD: Freq: Three times a day (TID) | CUTANEOUS | Status: DC

## 2016-04-26 MED ORDER — LANTUS SOLOSTAR 100 UNIT/ML ~~LOC~~ SOPN: PEN_INJECTOR | SUBCUTANEOUS | Status: DC

## 2016-04-26 NOTE — Telephone Encounter (Signed)
Script sent to [facility].

## 2016-04-26 NOTE — Telephone Encounter (Signed)
Lantus was resent  To pharmacy with 15 ml 6 refills. Rhonda Cunningham,CMA

## 2016-04-26 NOTE — Telephone Encounter (Signed)
She has a rash on her buttock up her back. [name] with [facility] states it is a fungus and would like Nystatin powder sent to pharmacy.

## 2016-04-27 ENCOUNTER — Ambulatory Visit (INDEPENDENT_AMBULATORY_CARE_PROVIDER_SITE_OTHER): Payer: Medicare Other | Admitting: Family Medicine

## 2016-04-27 ENCOUNTER — Encounter: Payer: Self-pay | Admitting: Family Medicine

## 2016-04-27 VITALS — BP 122/62 | HR 68

## 2016-04-27 DIAGNOSIS — D509 Iron deficiency anemia, unspecified: Secondary | ICD-10-CM

## 2016-04-27 DIAGNOSIS — S728X1G Other fracture of right femur, subsequent encounter for closed fracture with delayed healing: Secondary | ICD-10-CM

## 2016-04-27 DIAGNOSIS — Z299 Encounter for prophylactic measures, unspecified: Secondary | ICD-10-CM

## 2016-04-27 DIAGNOSIS — Z7901 Long term (current) use of anticoagulants: Secondary | ICD-10-CM

## 2016-04-27 DIAGNOSIS — G4710 Hypersomnia, unspecified: Secondary | ICD-10-CM

## 2016-04-27 DIAGNOSIS — D510 Vitamin B12 deficiency anemia due to intrinsic factor deficiency: Secondary | ICD-10-CM

## 2016-04-27 DIAGNOSIS — E1142 Type 2 diabetes mellitus with diabetic polyneuropathy: Secondary | ICD-10-CM

## 2016-04-27 DIAGNOSIS — L89302 Pressure ulcer of unspecified buttock, stage 2: Secondary | ICD-10-CM

## 2016-04-27 NOTE — Progress Notes (Addendum)
Subjective:    Patient ID: [name], female    DOB: [birth year], 82 y.o.   MRN: [id number]  HPI  82 y.o. female seen todayHospital/nursing home follow-up. She was recently discharged from  [facility]. 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 She underwent ORIF and removal of deep buried hardware consisting of previously placed right sided retrograde femoral nail. She had acute blood loss anemia and received 4 units PRBC transfusion. She was treated for staphylococcus UTI. She also had LUE thrombophlebitis and was treated with clindamycin. She is has a medical history of HTN, DM, CKD, afib, Hypothyroidism, Neuropathy among others.  She has worked with PT/OT for ROM, exercise, Gait stability and muscle strengthening.   She is currently on Lovenox SQ injection for until seen by Cardiologist [facility]. She will require a [place] Aid to assist with ADL's. [place] RN for medication management and coumadin INR check when resumed. No DME required has own walker.  She has follow-up with orthopedist on May 17 and until then she is supposed to be nonweightbearing on that right leg.  She is now experiencing some bedsores on her bottom. She hasn't felt good. She's been sleeping a lot. She has been talking about things that don't make sense.. They also changed her gabapentin in the hospital and her feet have become more painful. She has a history of neuropathy. They have actually decreased her gabapentin down to 200 mg at the nursing home and her feet were hurting so bad that it would keep her up at night and she was in tears. When she got discharged home she went back to taking 1 in the morning 2 in the afternoon and 3 at bedtime. And has been extremely sleepy and sedated since then.   Review of Systems  BP 122/62 mmHg  Pulse 68  Wt   SpO2 93%    Allergies  Allergen Reactions  . Ace Inhibitors      REACTION: cough  . Angiotensin Receptor Blockers Other (See Comments)    Renal dx.  Nephrology has recommended against use bc of hyperkalemia  . Fosinopril Sodium     REACTION: Cough  . Sulfamethoxazole-Trimethoprim     REACTION: Unspecified    Past Medical History  Diagnosis Date  . History of kidney stones   . Atrial fibrillation ([place]) 1/11     Dr Gwenlyn Found  . Ulcer     Gastric  . Esophageal stricture   . Hiatal hernia   . Diarrhea   . Memory loss   . Heart failure     chronic diatolic- 2 D echo 123XX123 EF 60%  . Microalbuminuria   . Anemia     Pernicious  . Diabetes mellitus     w/neuro manifestations, type II  . Chronic kidney disease     chronic, stage III  . Cystitis   . Osteoarthrosis involving multiple sites   . Swelling     limb, left more than right ankle   . Back pain   . Peptic ulcer     unspec. w/o obstruction  . Senile osteoporosis   . IBS (irritable bowel syndrome)   . Hypertension   . Tubular adenoma of colon   . GERD (gastroesophageal reflux disease)   . Heart murmur   . Hyperlipidemia   . Thyroid disease   . Pancreatitis   . H/O cardiac catheterization 2010  normal coronary arteries, EF 60%  . Hx of echocardiogram 04/04/13    EF 55-60%, mild MR  . PAF (paroxysmal atrial fibrillation) ([place])     Last DCCV 12/29/2009  . Chronic anticoagulation     on coumadin  . Symptomatic bradycardia 01/2010    CHB, temp pacer and then Medtronic PPM  . RBBB   . Non-stress test nonreactive 11/14    Lexiscan myoview negative for ischemia  . Complete heart block ([place]) 01/31/2015    Past Surgical History  Procedure Laterality Date  . Appendectomy    . Egd- esophageal stricture      gastric ulcer  . Carotid dopplers- no significant stenosis  2007  . Tonsillectomy and adenoidectomy    . Cataract extraction      both eyes  . Pacemaker placement  2/11    medtronic dual chamber  . Cardiac catheterization  2010    normal coronary arteries    Social History    Social History  . Marital Status: Widowed    Spouse Name: N/A  . Number of Children: N/A  . Years of Education: N/A   Occupational History  . Not on file.   Social History Main Topics  . Smoking status: Never Smoker   . Smokeless tobacco: Not on file  . Alcohol Use: No  . Drug Use: No  . Sexual Activity: No   Other Topics Concern  . Not on file   Social History Narrative    Family History  Problem Relation Age of Onset  . Diabetes Mother   . Prostate cancer Father   . Heart attack Father   . Colon cancer Sister   . Esophageal cancer Neg Hx   . Rectal cancer Neg Hx   . Stomach cancer Neg Hx   . Alzheimer's disease Sister   . Heart disease Brother     Outpatient Encounter Prescriptions as of 04/27/2016  Medication Sig  . ascorbic acid (VITAMIN C) 500 MG tablet Take 500 mg by mouth 3 (three) times daily with meals.  Marland Kitchen b complex vitamins capsule Take 1 capsule by mouth daily.  . calcium citrate (CALCITRATE - DOSED IN MG ELEMENTAL CALCIUM) 950 MG tablet Take 950 mg by mouth daily.  . collagenase (SANTYL) ointment Apply 1 application topically daily.  Marland Kitchen enoxaparin (LOVENOX) 30 MG/0.3ML injection Inject into the skin every 12 (twelve) hours. Inject 0.3 MLS every 12 hours. Stop date 05/10/16  . gabapentin (NEURONTIN) 600 MG tablet Take 600 mg by mouth at bedtime.  Marland Kitchen glipiZIDE (GLUCOTROL XL) 5 MG 24 hr tablet Take 1 tablet (5 mg total) by mouth daily with breakfast.  . glucose blood (ACCU-CHEK COMPACT PLUS) test strip Check blood sugars 3 times daily Dx: E11.9  . insulin glargine (LANTUS) 100 UNIT/ML injection Inject 17 Units into the skin 2 (two) times daily.  . Insulin Syringe-Needle U-100 (EASY COMFORT INSULIN SYRINGE) 30G X 5/16" 0.5 ML MISC by Does not apply route as directed. Reported on 04/15/2016  . iron polysaccharides (NIFEREX) 150 MG capsule Take 150 mg by mouth daily.  Marland Kitchen LANTUS SOLOSTAR 100 UNIT/ML Solostar Pen INJECT 28 UNITS INTO THE SKIN 2 (TWO) TIMES DAILY.  Marland Kitchen  levothyroxine (SYNTHROID, LEVOTHROID) 100 MCG tablet TAKE ONE TABLET BY MOUTH DAILY  . losartan (COZAAR) 25 MG tablet Take 25 mg by mouth daily.  . memantine (NAMENDA) 5 MG tablet Take 5 mg by mouth at bedtime.  . metoprolol (LOPRESSOR) 50 MG tablet Half tablet twice a day  .  Multiple Vitamins-Minerals (CERTAVITE SENIOR/ANTIOXIDANT) TABS Take 1 tablet by mouth daily.  Marland Kitchen nystatin (NYSTATIN) powder Apply topically 3 (three) times daily.  Marland Kitchen oxyCODONE (OXY IR/ROXICODONE) 5 MG immediate release tablet 1 by mouth every 4 hours as needed from as needed for mild to moderate pain, 2 by mouth every 4 hours as needed for severepain  . pantoprazole (PROTONIX) 40 MG tablet TAKE 1 TABLET (40 MG TOTAL) BY MOUTH DAILY. FOLLOW UP APPOINTMENT IS NEEDED  . sennosides-docusate sodium (SENOKOT-S) 8.6-50 MG tablet Take 2 tablets by mouth daily as needed for constipation. Hold for diarrhea  . zinc oxide 20 % ointment Apply 1 application topically 2 (two) times daily as needed for irritation (to external rectum and buttocks).  Marland Kitchen ipratropium-albuterol (DUONEB) 0.5-2.5 (3) MG/3ML SOLN Take 3 mLs by nebulization 2 (two) times daily.  . [DISCONTINUED] calcium carbonate (TUMS - DOSED IN MG ELEMENTAL CALCIUM) 500 MG chewable tablet Chew 1 tablet by mouth daily.  . [DISCONTINUED] gabapentin (NEURONTIN) 100 MG capsule Take 2 capsules ( 200 mg) by mouth daily  . [DISCONTINUED] gabapentin (NEURONTIN) 400 MG capsule Take 400 mg by mouth daily with lunch.   No facility-administered encounter medications on file as of 04/27/2016.          Objective:   Physical Exam  Constitutional: She is oriented to person, place, and time. She appears well-developed and well-nourished.  HENT:  Head: Normocephalic and atraumatic.  Cardiovascular: Normal rate, regular rhythm and normal heart sounds.   Pulmonary/Chest: Effort normal and breath sounds normal.  Neurological: She is alert and oriented to person, place, and time.  Skin: Skin is  warm and dry.  Age wanting to breakdown of the skin at the area of the coccyx right in the center sending to the right and left buttock cheek area. She also looks like she has a fair amount of bruising over the lumbar spine but it appears to be healing.  Psychiatric: She has a normal mood and affect. Her behavior is normal. Judgment and thought content normal.          Assessment & Plan:  Excess sedation-I believe it's probably from the dramatic increase of the gabapentin. I explained it can be extremely sedating. Then have her decrease her middle ear day dose to see if this helps and maybe even decrease her evening dose if she tolerates it. But I want to make sure that her neuropathy pain is also well controlled to that she can rest and heal.  Decubitus venous ulcer, stage II-I was unable to apply any type of dressing to the wound here in the office because she started to get weak in her leg. It was difficult to get worse to stand because she is not allowed to bear any weight at all on her right leg. I did give a sample of DuoDERM to her daughter to put on this evening when she is lying in bed. There was also perception for a [facility] which she can pick up this evening as well. Discussed the importance of being able to get pressure off that area. She's actually been sitting in her wheelchair for hours at a time. They can get her onto the couch etc. it might be easier for her to change position and use pillows to do that take pressure off her bottom. Home health is coming out and there should be a occupational therapist and physical therapist coming later this week to hopefully they can show her how to transition.  DVT prophylaxis-continue with subcutaneous Lovenox until she sees the orthopedist and is able to bear weight on her leg.  Iron deficiency anemia-we will recheck hemoglobin to make sure it's not contributing to the fatigue. But she did have 5 units of packed red blood cells.  Hemoglobin got as low as 6.7 on admission.  Hip fracture-we'll see orthopedist on May 17. She does still use the oxycodone occasionally but is not taking it regularly.  Diabetic peripheral neuropathy-we'll decrease her gabapentin as Azelex causing some excess sedation.

## 2016-04-27 NOTE — Patient Instructions (Signed)
Decrease gabapentin to 1 in the morning, 1 in the afternoon and 3 at bedtime. See if this still controls the pain in your feet but you don't feel a sleepy during the daytime.

## 2016-04-28 DIAGNOSIS — S72301K Unspecified fracture of shaft of right femur, subsequent encounter for closed fracture with nonunion: Secondary | ICD-10-CM | POA: Diagnosis not present

## 2016-04-28 DIAGNOSIS — R531 Weakness: Secondary | ICD-10-CM | POA: Diagnosis not present

## 2016-04-28 DIAGNOSIS — Z9181 History of falling: Secondary | ICD-10-CM | POA: Diagnosis not present

## 2016-04-28 DIAGNOSIS — R2689 Other abnormalities of gait and mobility: Secondary | ICD-10-CM | POA: Diagnosis not present

## 2016-04-28 DIAGNOSIS — Z5181 Encounter for therapeutic drug level monitoring: Secondary | ICD-10-CM | POA: Diagnosis not present

## 2016-04-28 DIAGNOSIS — Z96651 Presence of right artificial knee joint: Secondary | ICD-10-CM | POA: Diagnosis not present

## 2016-04-28 DIAGNOSIS — E119 Type 2 diabetes mellitus without complications: Secondary | ICD-10-CM | POA: Diagnosis not present

## 2016-04-28 DIAGNOSIS — Z4800 Encounter for change or removal of nonsurgical wound dressing: Secondary | ICD-10-CM | POA: Diagnosis not present

## 2016-04-28 DIAGNOSIS — L89153 Pressure ulcer of sacral region, stage 3: Secondary | ICD-10-CM | POA: Diagnosis not present

## 2016-04-28 DIAGNOSIS — Z7901 Long term (current) use of anticoagulants: Secondary | ICD-10-CM | POA: Diagnosis not present

## 2016-04-28 DIAGNOSIS — Z794 Long term (current) use of insulin: Secondary | ICD-10-CM | POA: Diagnosis not present

## 2016-04-28 DIAGNOSIS — I110 Hypertensive heart disease with heart failure: Secondary | ICD-10-CM | POA: Diagnosis not present

## 2016-04-28 DIAGNOSIS — I509 Heart failure, unspecified: Secondary | ICD-10-CM | POA: Diagnosis not present

## 2016-04-29 ENCOUNTER — Telehealth: Payer: Self-pay

## 2016-04-29 DIAGNOSIS — Z5181 Encounter for therapeutic drug level monitoring: Secondary | ICD-10-CM | POA: Diagnosis not present

## 2016-04-29 DIAGNOSIS — I509 Heart failure, unspecified: Secondary | ICD-10-CM | POA: Diagnosis not present

## 2016-04-29 DIAGNOSIS — I110 Hypertensive heart disease with heart failure: Secondary | ICD-10-CM | POA: Diagnosis not present

## 2016-04-29 DIAGNOSIS — R300 Dysuria: Secondary | ICD-10-CM | POA: Diagnosis not present

## 2016-04-29 DIAGNOSIS — Z96651 Presence of right artificial knee joint: Secondary | ICD-10-CM | POA: Diagnosis not present

## 2016-04-29 DIAGNOSIS — Z7901 Long term (current) use of anticoagulants: Secondary | ICD-10-CM | POA: Diagnosis not present

## 2016-04-29 DIAGNOSIS — Z4800 Encounter for change or removal of nonsurgical wound dressing: Secondary | ICD-10-CM | POA: Diagnosis not present

## 2016-04-29 DIAGNOSIS — L89153 Pressure ulcer of sacral region, stage 3: Secondary | ICD-10-CM | POA: Diagnosis not present

## 2016-04-29 DIAGNOSIS — Z794 Long term (current) use of insulin: Secondary | ICD-10-CM | POA: Diagnosis not present

## 2016-04-29 DIAGNOSIS — R531 Weakness: Secondary | ICD-10-CM | POA: Diagnosis not present

## 2016-04-29 DIAGNOSIS — E119 Type 2 diabetes mellitus without complications: Secondary | ICD-10-CM | POA: Diagnosis not present

## 2016-04-29 DIAGNOSIS — R2689 Other abnormalities of gait and mobility: Secondary | ICD-10-CM | POA: Diagnosis not present

## 2016-04-29 DIAGNOSIS — Z9181 History of falling: Secondary | ICD-10-CM | POA: Diagnosis not present

## 2016-04-29 DIAGNOSIS — S72301K Unspecified fracture of shaft of right femur, subsequent encounter for closed fracture with nonunion: Secondary | ICD-10-CM | POA: Diagnosis not present

## 2016-04-29 NOTE — Telephone Encounter (Signed)
The lab and home health was unable to draw blood for the labs.

## 2016-04-30 DIAGNOSIS — Z5181 Encounter for therapeutic drug level monitoring: Secondary | ICD-10-CM | POA: Diagnosis not present

## 2016-04-30 DIAGNOSIS — S72301K Unspecified fracture of shaft of right femur, subsequent encounter for closed fracture with nonunion: Secondary | ICD-10-CM | POA: Diagnosis not present

## 2016-04-30 DIAGNOSIS — I509 Heart failure, unspecified: Secondary | ICD-10-CM | POA: Diagnosis not present

## 2016-04-30 DIAGNOSIS — R2689 Other abnormalities of gait and mobility: Secondary | ICD-10-CM | POA: Diagnosis not present

## 2016-04-30 DIAGNOSIS — Z7901 Long term (current) use of anticoagulants: Secondary | ICD-10-CM | POA: Diagnosis not present

## 2016-04-30 DIAGNOSIS — R531 Weakness: Secondary | ICD-10-CM | POA: Diagnosis not present

## 2016-04-30 DIAGNOSIS — L89153 Pressure ulcer of sacral region, stage 3: Secondary | ICD-10-CM | POA: Diagnosis not present

## 2016-04-30 DIAGNOSIS — E119 Type 2 diabetes mellitus without complications: Secondary | ICD-10-CM | POA: Diagnosis not present

## 2016-04-30 DIAGNOSIS — Z794 Long term (current) use of insulin: Secondary | ICD-10-CM | POA: Diagnosis not present

## 2016-04-30 DIAGNOSIS — I110 Hypertensive heart disease with heart failure: Secondary | ICD-10-CM | POA: Diagnosis not present

## 2016-04-30 DIAGNOSIS — Z96651 Presence of right artificial knee joint: Secondary | ICD-10-CM | POA: Diagnosis not present

## 2016-04-30 DIAGNOSIS — Z9181 History of falling: Secondary | ICD-10-CM | POA: Diagnosis not present

## 2016-04-30 DIAGNOSIS — Z4800 Encounter for change or removal of nonsurgical wound dressing: Secondary | ICD-10-CM | POA: Diagnosis not present

## 2016-04-30 NOTE — Telephone Encounter (Signed)
Okay, see if they can try again on Monday.

## 2016-04-30 NOTE — Telephone Encounter (Signed)
Home Health advised.  

## 2016-05-02 DIAGNOSIS — S72351D Displaced comminuted fracture of shaft of right femur, subsequent encounter for closed fracture with routine healing: Secondary | ICD-10-CM | POA: Diagnosis not present

## 2016-05-03 DIAGNOSIS — S72301K Unspecified fracture of shaft of right femur, subsequent encounter for closed fracture with nonunion: Secondary | ICD-10-CM | POA: Diagnosis not present

## 2016-05-03 DIAGNOSIS — Z9181 History of falling: Secondary | ICD-10-CM | POA: Diagnosis not present

## 2016-05-03 DIAGNOSIS — Z7901 Long term (current) use of anticoagulants: Secondary | ICD-10-CM | POA: Diagnosis not present

## 2016-05-03 DIAGNOSIS — E119 Type 2 diabetes mellitus without complications: Secondary | ICD-10-CM | POA: Diagnosis not present

## 2016-05-03 DIAGNOSIS — I509 Heart failure, unspecified: Secondary | ICD-10-CM | POA: Diagnosis not present

## 2016-05-03 DIAGNOSIS — Z4800 Encounter for change or removal of nonsurgical wound dressing: Secondary | ICD-10-CM | POA: Diagnosis not present

## 2016-05-03 DIAGNOSIS — L89153 Pressure ulcer of sacral region, stage 3: Secondary | ICD-10-CM | POA: Diagnosis not present

## 2016-05-03 DIAGNOSIS — Z96651 Presence of right artificial knee joint: Secondary | ICD-10-CM | POA: Diagnosis not present

## 2016-05-03 DIAGNOSIS — Z5181 Encounter for therapeutic drug level monitoring: Secondary | ICD-10-CM | POA: Diagnosis not present

## 2016-05-03 DIAGNOSIS — I110 Hypertensive heart disease with heart failure: Secondary | ICD-10-CM | POA: Diagnosis not present

## 2016-05-03 DIAGNOSIS — R2689 Other abnormalities of gait and mobility: Secondary | ICD-10-CM | POA: Diagnosis not present

## 2016-05-03 DIAGNOSIS — R531 Weakness: Secondary | ICD-10-CM | POA: Diagnosis not present

## 2016-05-03 DIAGNOSIS — Z794 Long term (current) use of insulin: Secondary | ICD-10-CM | POA: Diagnosis not present

## 2016-05-05 ENCOUNTER — Telehealth: Payer: Self-pay | Admitting: *Deleted

## 2016-05-05 DIAGNOSIS — S72301K Unspecified fracture of shaft of right femur, subsequent encounter for closed fracture with nonunion: Secondary | ICD-10-CM | POA: Diagnosis not present

## 2016-05-05 DIAGNOSIS — I110 Hypertensive heart disease with heart failure: Secondary | ICD-10-CM | POA: Diagnosis not present

## 2016-05-05 DIAGNOSIS — Z4800 Encounter for change or removal of nonsurgical wound dressing: Secondary | ICD-10-CM | POA: Diagnosis not present

## 2016-05-05 DIAGNOSIS — Z5181 Encounter for therapeutic drug level monitoring: Secondary | ICD-10-CM | POA: Diagnosis not present

## 2016-05-05 DIAGNOSIS — L89153 Pressure ulcer of sacral region, stage 3: Secondary | ICD-10-CM | POA: Diagnosis not present

## 2016-05-05 DIAGNOSIS — I509 Heart failure, unspecified: Secondary | ICD-10-CM | POA: Diagnosis not present

## 2016-05-05 DIAGNOSIS — R531 Weakness: Secondary | ICD-10-CM | POA: Diagnosis not present

## 2016-05-05 DIAGNOSIS — Z96651 Presence of right artificial knee joint: Secondary | ICD-10-CM | POA: Diagnosis not present

## 2016-05-05 DIAGNOSIS — R2689 Other abnormalities of gait and mobility: Secondary | ICD-10-CM | POA: Diagnosis not present

## 2016-05-05 DIAGNOSIS — Z7901 Long term (current) use of anticoagulants: Secondary | ICD-10-CM | POA: Diagnosis not present

## 2016-05-05 DIAGNOSIS — Z794 Long term (current) use of insulin: Secondary | ICD-10-CM | POA: Diagnosis not present

## 2016-05-05 DIAGNOSIS — E119 Type 2 diabetes mellitus without complications: Secondary | ICD-10-CM | POA: Diagnosis not present

## 2016-05-05 DIAGNOSIS — Z9181 History of falling: Secondary | ICD-10-CM | POA: Diagnosis not present

## 2016-05-05 MED ORDER — CIPROFLOXACIN HCL 500 MG PO TABS: 500.0000 mg | ORAL_TABLET | Freq: Two times a day (BID) | ORAL | Status: DC

## 2016-05-05 MED ORDER — FLUCONAZOLE 150 MG PO TABS: 150.0000 mg | ORAL_TABLET | Freq: Once | ORAL | Status: DC

## 2016-05-05 MED ORDER — CLOTRIMAZOLE 1 % EX CREA: 1.0000 "application " | TOPICAL_CREAM | Freq: Two times a day (BID) | CUTANEOUS | Status: AC

## 2016-05-05 NOTE — Telephone Encounter (Addendum)
Pam w/[facility] called and stated that she was unable to collect [name] labs. She would have someone else to do this. She also wanted to know if we received the report for her U/A. I informed her that we had. Donnelly Stager PA-C looked over this and made recommendations.[name] [place]

## 2016-05-06 DIAGNOSIS — S72301K Unspecified fracture of shaft of right femur, subsequent encounter for closed fracture with nonunion: Secondary | ICD-10-CM | POA: Diagnosis not present

## 2016-05-06 DIAGNOSIS — Z5181 Encounter for therapeutic drug level monitoring: Secondary | ICD-10-CM | POA: Diagnosis not present

## 2016-05-06 DIAGNOSIS — I509 Heart failure, unspecified: Secondary | ICD-10-CM | POA: Diagnosis not present

## 2016-05-06 DIAGNOSIS — Z9181 History of falling: Secondary | ICD-10-CM | POA: Diagnosis not present

## 2016-05-06 DIAGNOSIS — L89153 Pressure ulcer of sacral region, stage 3: Secondary | ICD-10-CM | POA: Diagnosis not present

## 2016-05-06 DIAGNOSIS — Z794 Long term (current) use of insulin: Secondary | ICD-10-CM | POA: Diagnosis not present

## 2016-05-06 DIAGNOSIS — E119 Type 2 diabetes mellitus without complications: Secondary | ICD-10-CM | POA: Diagnosis not present

## 2016-05-06 DIAGNOSIS — Z96651 Presence of right artificial knee joint: Secondary | ICD-10-CM | POA: Diagnosis not present

## 2016-05-06 DIAGNOSIS — R531 Weakness: Secondary | ICD-10-CM | POA: Diagnosis not present

## 2016-05-06 DIAGNOSIS — I110 Hypertensive heart disease with heart failure: Secondary | ICD-10-CM | POA: Diagnosis not present

## 2016-05-06 DIAGNOSIS — Z7901 Long term (current) use of anticoagulants: Secondary | ICD-10-CM | POA: Diagnosis not present

## 2016-05-06 DIAGNOSIS — R2689 Other abnormalities of gait and mobility: Secondary | ICD-10-CM | POA: Diagnosis not present

## 2016-05-06 DIAGNOSIS — Z4800 Encounter for change or removal of nonsurgical wound dressing: Secondary | ICD-10-CM | POA: Diagnosis not present

## 2016-05-06 LAB — CBC AND DIFFERENTIAL
HCT: 34 % — AB (ref 36–46)
HEMOGLOBIN: 10.6 g/dL — AB (ref 12.0–16.0)
NEUTROS ABS: 6 /uL
PLATELETS: 17 10*3/uL — AB (ref 150–399)
WBC: 9.2 10^3/mL

## 2016-05-06 LAB — BASIC METABOLIC PANEL
BUN: 12 mg/dL (ref 4–21)
Creatinine: 1 mg/dL (ref 0.5–1.1)
Glucose: 51 mg/dL
Potassium: 5.3 mmol/L (ref 3.4–5.3)
Sodium: 145 mmol/L (ref 137–147)

## 2016-05-07 DIAGNOSIS — Z794 Long term (current) use of insulin: Secondary | ICD-10-CM | POA: Diagnosis not present

## 2016-05-07 DIAGNOSIS — R531 Weakness: Secondary | ICD-10-CM | POA: Diagnosis not present

## 2016-05-07 DIAGNOSIS — Z5181 Encounter for therapeutic drug level monitoring: Secondary | ICD-10-CM | POA: Diagnosis not present

## 2016-05-07 DIAGNOSIS — I110 Hypertensive heart disease with heart failure: Secondary | ICD-10-CM | POA: Diagnosis not present

## 2016-05-07 DIAGNOSIS — Z9181 History of falling: Secondary | ICD-10-CM | POA: Diagnosis not present

## 2016-05-07 DIAGNOSIS — R2689 Other abnormalities of gait and mobility: Secondary | ICD-10-CM | POA: Diagnosis not present

## 2016-05-07 DIAGNOSIS — E119 Type 2 diabetes mellitus without complications: Secondary | ICD-10-CM | POA: Diagnosis not present

## 2016-05-07 DIAGNOSIS — Z4800 Encounter for change or removal of nonsurgical wound dressing: Secondary | ICD-10-CM | POA: Diagnosis not present

## 2016-05-07 DIAGNOSIS — Z7901 Long term (current) use of anticoagulants: Secondary | ICD-10-CM | POA: Diagnosis not present

## 2016-05-07 DIAGNOSIS — Z96651 Presence of right artificial knee joint: Secondary | ICD-10-CM | POA: Diagnosis not present

## 2016-05-07 DIAGNOSIS — S72301K Unspecified fracture of shaft of right femur, subsequent encounter for closed fracture with nonunion: Secondary | ICD-10-CM | POA: Diagnosis not present

## 2016-05-07 DIAGNOSIS — I509 Heart failure, unspecified: Secondary | ICD-10-CM | POA: Diagnosis not present

## 2016-05-07 DIAGNOSIS — L89153 Pressure ulcer of sacral region, stage 3: Secondary | ICD-10-CM | POA: Diagnosis not present

## 2016-05-10 DIAGNOSIS — Z7901 Long term (current) use of anticoagulants: Secondary | ICD-10-CM | POA: Diagnosis not present

## 2016-05-10 DIAGNOSIS — I110 Hypertensive heart disease with heart failure: Secondary | ICD-10-CM | POA: Diagnosis not present

## 2016-05-10 DIAGNOSIS — S72301K Unspecified fracture of shaft of right femur, subsequent encounter for closed fracture with nonunion: Secondary | ICD-10-CM | POA: Diagnosis not present

## 2016-05-10 DIAGNOSIS — Z794 Long term (current) use of insulin: Secondary | ICD-10-CM | POA: Diagnosis not present

## 2016-05-10 DIAGNOSIS — Z4800 Encounter for change or removal of nonsurgical wound dressing: Secondary | ICD-10-CM | POA: Diagnosis not present

## 2016-05-10 DIAGNOSIS — Z5181 Encounter for therapeutic drug level monitoring: Secondary | ICD-10-CM | POA: Diagnosis not present

## 2016-05-10 DIAGNOSIS — I509 Heart failure, unspecified: Secondary | ICD-10-CM | POA: Diagnosis not present

## 2016-05-10 DIAGNOSIS — E119 Type 2 diabetes mellitus without complications: Secondary | ICD-10-CM | POA: Diagnosis not present

## 2016-05-10 DIAGNOSIS — R531 Weakness: Secondary | ICD-10-CM | POA: Diagnosis not present

## 2016-05-10 DIAGNOSIS — Z9181 History of falling: Secondary | ICD-10-CM | POA: Diagnosis not present

## 2016-05-10 DIAGNOSIS — R2689 Other abnormalities of gait and mobility: Secondary | ICD-10-CM | POA: Diagnosis not present

## 2016-05-10 DIAGNOSIS — L89153 Pressure ulcer of sacral region, stage 3: Secondary | ICD-10-CM | POA: Diagnosis not present

## 2016-05-10 DIAGNOSIS — Z96651 Presence of right artificial knee joint: Secondary | ICD-10-CM | POA: Diagnosis not present

## 2016-05-12 DIAGNOSIS — S72301K Unspecified fracture of shaft of right femur, subsequent encounter for closed fracture with nonunion: Secondary | ICD-10-CM | POA: Diagnosis not present

## 2016-05-12 DIAGNOSIS — Z5181 Encounter for therapeutic drug level monitoring: Secondary | ICD-10-CM | POA: Diagnosis not present

## 2016-05-12 DIAGNOSIS — Z79891 Long term (current) use of opiate analgesic: Secondary | ICD-10-CM | POA: Diagnosis not present

## 2016-05-12 DIAGNOSIS — Z950 Presence of cardiac pacemaker: Secondary | ICD-10-CM | POA: Diagnosis not present

## 2016-05-12 DIAGNOSIS — Z4789 Encounter for other orthopedic aftercare: Secondary | ICD-10-CM | POA: Diagnosis not present

## 2016-05-12 DIAGNOSIS — Z9181 History of falling: Secondary | ICD-10-CM | POA: Diagnosis not present

## 2016-05-12 DIAGNOSIS — Z794 Long term (current) use of insulin: Secondary | ICD-10-CM | POA: Diagnosis not present

## 2016-05-12 DIAGNOSIS — Z79899 Other long term (current) drug therapy: Secondary | ICD-10-CM | POA: Diagnosis not present

## 2016-05-12 DIAGNOSIS — E785 Hyperlipidemia, unspecified: Secondary | ICD-10-CM | POA: Diagnosis not present

## 2016-05-12 DIAGNOSIS — I110 Hypertensive heart disease with heart failure: Secondary | ICD-10-CM | POA: Diagnosis not present

## 2016-05-12 DIAGNOSIS — R2689 Other abnormalities of gait and mobility: Secondary | ICD-10-CM | POA: Diagnosis not present

## 2016-05-12 DIAGNOSIS — R531 Weakness: Secondary | ICD-10-CM | POA: Diagnosis not present

## 2016-05-12 DIAGNOSIS — I509 Heart failure, unspecified: Secondary | ICD-10-CM | POA: Diagnosis not present

## 2016-05-12 DIAGNOSIS — E119 Type 2 diabetes mellitus without complications: Secondary | ICD-10-CM | POA: Diagnosis not present

## 2016-05-12 DIAGNOSIS — Z4800 Encounter for change or removal of nonsurgical wound dressing: Secondary | ICD-10-CM | POA: Diagnosis not present

## 2016-05-12 DIAGNOSIS — I4891 Unspecified atrial fibrillation: Secondary | ICD-10-CM | POA: Diagnosis not present

## 2016-05-12 DIAGNOSIS — L89153 Pressure ulcer of sacral region, stage 3: Secondary | ICD-10-CM | POA: Diagnosis not present

## 2016-05-12 DIAGNOSIS — S72351K Displaced comminuted fracture of shaft of right femur, subsequent encounter for closed fracture with nonunion: Secondary | ICD-10-CM | POA: Diagnosis not present

## 2016-05-12 DIAGNOSIS — S72491D Other fracture of lower end of right femur, subsequent encounter for closed fracture with routine healing: Secondary | ICD-10-CM | POA: Diagnosis not present

## 2016-05-12 DIAGNOSIS — Z7901 Long term (current) use of anticoagulants: Secondary | ICD-10-CM | POA: Diagnosis not present

## 2016-05-12 DIAGNOSIS — Z888 Allergy status to other drugs, medicaments and biological substances status: Secondary | ICD-10-CM | POA: Diagnosis not present

## 2016-05-12 DIAGNOSIS — Z96651 Presence of right artificial knee joint: Secondary | ICD-10-CM | POA: Diagnosis not present

## 2016-05-12 DIAGNOSIS — I119 Hypertensive heart disease without heart failure: Secondary | ICD-10-CM | POA: Diagnosis not present

## 2016-05-13 DIAGNOSIS — S72301K Unspecified fracture of shaft of right femur, subsequent encounter for closed fracture with nonunion: Secondary | ICD-10-CM | POA: Diagnosis not present

## 2016-05-13 DIAGNOSIS — R2689 Other abnormalities of gait and mobility: Secondary | ICD-10-CM | POA: Diagnosis not present

## 2016-05-13 DIAGNOSIS — Z9181 History of falling: Secondary | ICD-10-CM | POA: Diagnosis not present

## 2016-05-13 DIAGNOSIS — Z4800 Encounter for change or removal of nonsurgical wound dressing: Secondary | ICD-10-CM | POA: Diagnosis not present

## 2016-05-13 DIAGNOSIS — E119 Type 2 diabetes mellitus without complications: Secondary | ICD-10-CM | POA: Diagnosis not present

## 2016-05-13 DIAGNOSIS — Z7901 Long term (current) use of anticoagulants: Secondary | ICD-10-CM | POA: Diagnosis not present

## 2016-05-13 DIAGNOSIS — I509 Heart failure, unspecified: Secondary | ICD-10-CM | POA: Diagnosis not present

## 2016-05-13 DIAGNOSIS — I110 Hypertensive heart disease with heart failure: Secondary | ICD-10-CM | POA: Diagnosis not present

## 2016-05-13 DIAGNOSIS — R531 Weakness: Secondary | ICD-10-CM | POA: Diagnosis not present

## 2016-05-13 DIAGNOSIS — Z96651 Presence of right artificial knee joint: Secondary | ICD-10-CM | POA: Diagnosis not present

## 2016-05-13 DIAGNOSIS — Z794 Long term (current) use of insulin: Secondary | ICD-10-CM | POA: Diagnosis not present

## 2016-05-13 DIAGNOSIS — L89153 Pressure ulcer of sacral region, stage 3: Secondary | ICD-10-CM | POA: Diagnosis not present

## 2016-05-13 DIAGNOSIS — Z5181 Encounter for therapeutic drug level monitoring: Secondary | ICD-10-CM | POA: Diagnosis not present

## 2016-05-14 DIAGNOSIS — L89153 Pressure ulcer of sacral region, stage 3: Secondary | ICD-10-CM | POA: Diagnosis not present

## 2016-05-14 DIAGNOSIS — S72351D Displaced comminuted fracture of shaft of right femur, subsequent encounter for closed fracture with routine healing: Secondary | ICD-10-CM | POA: Diagnosis not present

## 2016-05-17 ENCOUNTER — Encounter: Payer: Self-pay | Admitting: Family Medicine

## 2016-05-17 DIAGNOSIS — Z794 Long term (current) use of insulin: Secondary | ICD-10-CM | POA: Diagnosis not present

## 2016-05-17 DIAGNOSIS — R531 Weakness: Secondary | ICD-10-CM | POA: Diagnosis not present

## 2016-05-17 DIAGNOSIS — S72301K Unspecified fracture of shaft of right femur, subsequent encounter for closed fracture with nonunion: Secondary | ICD-10-CM | POA: Diagnosis not present

## 2016-05-17 DIAGNOSIS — I509 Heart failure, unspecified: Secondary | ICD-10-CM | POA: Diagnosis not present

## 2016-05-17 DIAGNOSIS — L89153 Pressure ulcer of sacral region, stage 3: Secondary | ICD-10-CM | POA: Diagnosis not present

## 2016-05-17 DIAGNOSIS — I110 Hypertensive heart disease with heart failure: Secondary | ICD-10-CM | POA: Diagnosis not present

## 2016-05-17 DIAGNOSIS — E119 Type 2 diabetes mellitus without complications: Secondary | ICD-10-CM | POA: Diagnosis not present

## 2016-05-17 DIAGNOSIS — Z4800 Encounter for change or removal of nonsurgical wound dressing: Secondary | ICD-10-CM | POA: Diagnosis not present

## 2016-05-17 DIAGNOSIS — Z7901 Long term (current) use of anticoagulants: Secondary | ICD-10-CM | POA: Diagnosis not present

## 2016-05-17 DIAGNOSIS — R2689 Other abnormalities of gait and mobility: Secondary | ICD-10-CM | POA: Diagnosis not present

## 2016-05-17 DIAGNOSIS — Z9181 History of falling: Secondary | ICD-10-CM | POA: Diagnosis not present

## 2016-05-17 DIAGNOSIS — Z96651 Presence of right artificial knee joint: Secondary | ICD-10-CM | POA: Diagnosis not present

## 2016-05-17 DIAGNOSIS — Z5181 Encounter for therapeutic drug level monitoring: Secondary | ICD-10-CM | POA: Diagnosis not present

## 2016-05-19 ENCOUNTER — Other Ambulatory Visit: Payer: Self-pay | Admitting: Family Medicine

## 2016-05-19 DIAGNOSIS — Z5181 Encounter for therapeutic drug level monitoring: Secondary | ICD-10-CM | POA: Diagnosis not present

## 2016-05-19 DIAGNOSIS — Z4800 Encounter for change or removal of nonsurgical wound dressing: Secondary | ICD-10-CM | POA: Diagnosis not present

## 2016-05-19 DIAGNOSIS — I509 Heart failure, unspecified: Secondary | ICD-10-CM | POA: Diagnosis not present

## 2016-05-19 DIAGNOSIS — Z96651 Presence of right artificial knee joint: Secondary | ICD-10-CM | POA: Diagnosis not present

## 2016-05-19 DIAGNOSIS — Z794 Long term (current) use of insulin: Secondary | ICD-10-CM | POA: Diagnosis not present

## 2016-05-19 DIAGNOSIS — R531 Weakness: Secondary | ICD-10-CM | POA: Diagnosis not present

## 2016-05-19 DIAGNOSIS — Z9181 History of falling: Secondary | ICD-10-CM | POA: Diagnosis not present

## 2016-05-19 DIAGNOSIS — R2689 Other abnormalities of gait and mobility: Secondary | ICD-10-CM | POA: Diagnosis not present

## 2016-05-19 DIAGNOSIS — L89153 Pressure ulcer of sacral region, stage 3: Secondary | ICD-10-CM | POA: Diagnosis not present

## 2016-05-19 DIAGNOSIS — S72301K Unspecified fracture of shaft of right femur, subsequent encounter for closed fracture with nonunion: Secondary | ICD-10-CM | POA: Diagnosis not present

## 2016-05-19 DIAGNOSIS — E119 Type 2 diabetes mellitus without complications: Secondary | ICD-10-CM | POA: Diagnosis not present

## 2016-05-19 DIAGNOSIS — Z7901 Long term (current) use of anticoagulants: Secondary | ICD-10-CM | POA: Diagnosis not present

## 2016-05-19 DIAGNOSIS — I110 Hypertensive heart disease with heart failure: Secondary | ICD-10-CM | POA: Diagnosis not present

## 2016-05-20 ENCOUNTER — Encounter: Payer: Self-pay | Admitting: Family Medicine

## 2016-05-20 DIAGNOSIS — Z4800 Encounter for change or removal of nonsurgical wound dressing: Secondary | ICD-10-CM | POA: Diagnosis not present

## 2016-05-20 DIAGNOSIS — R2689 Other abnormalities of gait and mobility: Secondary | ICD-10-CM | POA: Diagnosis not present

## 2016-05-20 DIAGNOSIS — Z7901 Long term (current) use of anticoagulants: Secondary | ICD-10-CM | POA: Diagnosis not present

## 2016-05-20 DIAGNOSIS — E119 Type 2 diabetes mellitus without complications: Secondary | ICD-10-CM | POA: Diagnosis not present

## 2016-05-20 DIAGNOSIS — L89153 Pressure ulcer of sacral region, stage 3: Secondary | ICD-10-CM | POA: Diagnosis not present

## 2016-05-20 DIAGNOSIS — I509 Heart failure, unspecified: Secondary | ICD-10-CM | POA: Diagnosis not present

## 2016-05-20 DIAGNOSIS — R531 Weakness: Secondary | ICD-10-CM | POA: Diagnosis not present

## 2016-05-20 DIAGNOSIS — Z9181 History of falling: Secondary | ICD-10-CM | POA: Diagnosis not present

## 2016-05-20 DIAGNOSIS — Z794 Long term (current) use of insulin: Secondary | ICD-10-CM | POA: Diagnosis not present

## 2016-05-20 DIAGNOSIS — Z5181 Encounter for therapeutic drug level monitoring: Secondary | ICD-10-CM | POA: Diagnosis not present

## 2016-05-20 DIAGNOSIS — S72301K Unspecified fracture of shaft of right femur, subsequent encounter for closed fracture with nonunion: Secondary | ICD-10-CM | POA: Diagnosis not present

## 2016-05-20 DIAGNOSIS — Z96651 Presence of right artificial knee joint: Secondary | ICD-10-CM | POA: Diagnosis not present

## 2016-05-20 DIAGNOSIS — I110 Hypertensive heart disease with heart failure: Secondary | ICD-10-CM | POA: Diagnosis not present

## 2016-05-25 DIAGNOSIS — I509 Heart failure, unspecified: Secondary | ICD-10-CM | POA: Diagnosis not present

## 2016-05-25 DIAGNOSIS — E119 Type 2 diabetes mellitus without complications: Secondary | ICD-10-CM | POA: Diagnosis not present

## 2016-05-25 DIAGNOSIS — Z794 Long term (current) use of insulin: Secondary | ICD-10-CM | POA: Diagnosis not present

## 2016-05-25 DIAGNOSIS — Z7901 Long term (current) use of anticoagulants: Secondary | ICD-10-CM | POA: Diagnosis not present

## 2016-05-25 DIAGNOSIS — L89153 Pressure ulcer of sacral region, stage 3: Secondary | ICD-10-CM | POA: Diagnosis not present

## 2016-05-25 DIAGNOSIS — Z4800 Encounter for change or removal of nonsurgical wound dressing: Secondary | ICD-10-CM | POA: Diagnosis not present

## 2016-05-25 DIAGNOSIS — Z96651 Presence of right artificial knee joint: Secondary | ICD-10-CM | POA: Diagnosis not present

## 2016-05-25 DIAGNOSIS — Z9181 History of falling: Secondary | ICD-10-CM | POA: Diagnosis not present

## 2016-05-25 DIAGNOSIS — Z5181 Encounter for therapeutic drug level monitoring: Secondary | ICD-10-CM | POA: Diagnosis not present

## 2016-05-25 DIAGNOSIS — I110 Hypertensive heart disease with heart failure: Secondary | ICD-10-CM | POA: Diagnosis not present

## 2016-05-25 DIAGNOSIS — R531 Weakness: Secondary | ICD-10-CM | POA: Diagnosis not present

## 2016-05-25 DIAGNOSIS — R2689 Other abnormalities of gait and mobility: Secondary | ICD-10-CM | POA: Diagnosis not present

## 2016-05-25 DIAGNOSIS — S72301K Unspecified fracture of shaft of right femur, subsequent encounter for closed fracture with nonunion: Secondary | ICD-10-CM | POA: Diagnosis not present

## 2016-05-26 ENCOUNTER — Ambulatory Visit (INDEPENDENT_AMBULATORY_CARE_PROVIDER_SITE_OTHER): Payer: Medicare Other | Admitting: Pharmacist Clinician (PhC)/ Clinical Pharmacy Specialist

## 2016-05-26 ENCOUNTER — Telehealth: Payer: Self-pay | Admitting: *Deleted

## 2016-05-26 ENCOUNTER — Encounter: Payer: Self-pay | Admitting: Family Medicine

## 2016-05-26 ENCOUNTER — Ambulatory Visit (INDEPENDENT_AMBULATORY_CARE_PROVIDER_SITE_OTHER): Payer: Medicare Other | Admitting: Family Medicine

## 2016-05-26 VITALS — BP 131/75 | HR 69

## 2016-05-26 DIAGNOSIS — Z5181 Encounter for therapeutic drug level monitoring: Secondary | ICD-10-CM | POA: Diagnosis not present

## 2016-05-26 DIAGNOSIS — R531 Weakness: Secondary | ICD-10-CM | POA: Diagnosis not present

## 2016-05-26 DIAGNOSIS — I110 Hypertensive heart disease with heart failure: Secondary | ICD-10-CM | POA: Diagnosis not present

## 2016-05-26 DIAGNOSIS — Z87440 Personal history of urinary (tract) infections: Secondary | ICD-10-CM

## 2016-05-26 DIAGNOSIS — I480 Paroxysmal atrial fibrillation: Secondary | ICD-10-CM

## 2016-05-26 DIAGNOSIS — R2689 Other abnormalities of gait and mobility: Secondary | ICD-10-CM | POA: Diagnosis not present

## 2016-05-26 DIAGNOSIS — Z9181 History of falling: Secondary | ICD-10-CM | POA: Diagnosis not present

## 2016-05-26 DIAGNOSIS — E1143 Type 2 diabetes mellitus with diabetic autonomic (poly)neuropathy: Secondary | ICD-10-CM

## 2016-05-26 DIAGNOSIS — Z299 Encounter for prophylactic measures, unspecified: Secondary | ICD-10-CM

## 2016-05-26 DIAGNOSIS — M79604 Pain in right leg: Secondary | ICD-10-CM

## 2016-05-26 DIAGNOSIS — Z7901 Long term (current) use of anticoagulants: Secondary | ICD-10-CM

## 2016-05-26 DIAGNOSIS — Z794 Long term (current) use of insulin: Secondary | ICD-10-CM | POA: Diagnosis not present

## 2016-05-26 DIAGNOSIS — Z4800 Encounter for change or removal of nonsurgical wound dressing: Secondary | ICD-10-CM | POA: Diagnosis not present

## 2016-05-26 DIAGNOSIS — L89153 Pressure ulcer of sacral region, stage 3: Secondary | ICD-10-CM | POA: Diagnosis not present

## 2016-05-26 DIAGNOSIS — S72301K Unspecified fracture of shaft of right femur, subsequent encounter for closed fracture with nonunion: Secondary | ICD-10-CM | POA: Diagnosis not present

## 2016-05-26 DIAGNOSIS — S72141D Displaced intertrochanteric fracture of right femur, subsequent encounter for closed fracture with routine healing: Secondary | ICD-10-CM

## 2016-05-26 DIAGNOSIS — E119 Type 2 diabetes mellitus without complications: Secondary | ICD-10-CM | POA: Diagnosis not present

## 2016-05-26 DIAGNOSIS — I509 Heart failure, unspecified: Secondary | ICD-10-CM | POA: Diagnosis not present

## 2016-05-26 DIAGNOSIS — Z96651 Presence of right artificial knee joint: Secondary | ICD-10-CM | POA: Diagnosis not present

## 2016-05-26 LAB — POCT INR: INR: 1.8

## 2016-05-26 LAB — POCT GLYCOSYLATED HEMOGLOBIN (HGB A1C): HEMOGLOBIN A1C: 6.7

## 2016-05-26 NOTE — Telephone Encounter (Signed)
Called and informed [name] with [facility] that I was unable to collect a UA due to the pt being non weight bearing and not having a exam table that she could get on to obtain sample. She informed me that she would collect this.[name]

## 2016-05-26 NOTE — Patient Instructions (Signed)
If your sugar remains elevated over the next couple days and go ahead and increase her Lantus to 24 units twice a day.

## 2016-05-26 NOTE — Progress Notes (Signed)
Subjective:    CC: Sedation  HPI:  C/O sleepinesss - At a recent visit she had increase her dose of gabapentin it was causing some excess sedation. We discussed decreasing it back down to see if that was helpful. She feels much more alert and is not taking any daytime naps. She feels like she is actually sleeping well at night.  Diabetes - no hypoglycemic events. No wounds or sores that are not healing well. No increased thirst or urination. Checking glucose at home. Taking medications as prescribed without any side effects.'  Atrial fibrillation-she's now back on Coumadin and needs an INR check. They need to be faxed to Erasmo Downer also stated at cardiology.  Hip fracture-she is still nonweightbearing. She saw the orthopedist about 2 weeks ago Week she will actually be able to start wearing some weight. She is getting some assistance through home physical therapy. She has had more pain in her right leg today then over the last several weeks. She's not exactly sure why. No injury or trauma. Her daughter reports that her knee has been swelling a little bit more than usual over the last week or so. She is on Coumadin and has been on Lovenox injections.  She does feel like she starting to get some urinary symptoms again. Home health and requested that we try to get a urine sample here today in the office. Unfortunately, she is still unable to bear weight on that right hip. No fevers chills or sweats. No back pain. No hematuria.  Past medical history, Surgical history, Family history not pertinant except as noted below, Social history, Allergies, and medications have been entered into the medical record, reviewed, and corrections made.   Review of Systems: No fevers, chills, night sweats, weight loss, chest pain, or shortness of breath.   Objective:    General: Well Developed, well nourished, and in no acute distress.  Neuro: Alert and oriented x3, extra-ocular muscles intact, sensation grossly  intact.  HEENT: Normocephalic, atraumatic  Skin: Warm and dry, no rashes. Cardiac: Regular rate and rhythm, no murmurs rubs or gallops, no lower extremity edema.  Respiratory: Clear to auscultation bilaterally. Not using accessory muscles, speaking in full sentences.   Impression and Recommendations:   Sedation - Much improved on the reduced dose of gabapentin. She is now taking 1 in the morning and 3 at bedtime. Occasionally she'll take 1 the afternoon but not often.  DM- Much improved. Hemoglobin A1c down to 6.7 which is from previous of 8.0. Great work in getting this back down. She is now currently on glipizide and Lantus. Her blood sugar has been a little higher over the last couple days but her daughter says she's also been eating more as her appetite is slowly coming back. If her sugars remain a little elevated over the next couple days and encouraged her to increase her Lantus to 24 units twice a day. She's currently using 22 units twice a day.  Frequent urinary tract infections-we'll see if home health can collect an in and out catheter urine at home since we're not able to get her up on the table today since she is not allowed to be weightbearing.  Right leg pain-she says the oxycodone doesn't work as well as the [employer] that she was given previously. I just explained that she concerned he take a Tylenol with the oxycodone and it should work just as well. She has had more pain which is a little unusual. Certainly could be related to spasms  etc. DVT would be unlikely since she's on Lovenox and Coumadin.  A-FIB/post op anticoagulation-we'll fax INR over to cardiology for them to adjust her levels. She is sub theapeutic today.

## 2016-05-26 NOTE — Telephone Encounter (Signed)
Pt's INR result routed thru epic to [facility] and pt's cardiologist.Domenica Weightman, [name]

## 2016-05-27 DIAGNOSIS — Z4800 Encounter for change or removal of nonsurgical wound dressing: Secondary | ICD-10-CM | POA: Diagnosis not present

## 2016-05-27 DIAGNOSIS — I110 Hypertensive heart disease with heart failure: Secondary | ICD-10-CM | POA: Diagnosis not present

## 2016-05-27 DIAGNOSIS — Z794 Long term (current) use of insulin: Secondary | ICD-10-CM | POA: Diagnosis not present

## 2016-05-27 DIAGNOSIS — S72301K Unspecified fracture of shaft of right femur, subsequent encounter for closed fracture with nonunion: Secondary | ICD-10-CM | POA: Diagnosis not present

## 2016-05-27 DIAGNOSIS — R2689 Other abnormalities of gait and mobility: Secondary | ICD-10-CM | POA: Diagnosis not present

## 2016-05-27 DIAGNOSIS — Z5181 Encounter for therapeutic drug level monitoring: Secondary | ICD-10-CM | POA: Diagnosis not present

## 2016-05-27 DIAGNOSIS — R531 Weakness: Secondary | ICD-10-CM | POA: Diagnosis not present

## 2016-05-27 DIAGNOSIS — Z7901 Long term (current) use of anticoagulants: Secondary | ICD-10-CM | POA: Diagnosis not present

## 2016-05-27 DIAGNOSIS — E119 Type 2 diabetes mellitus without complications: Secondary | ICD-10-CM | POA: Diagnosis not present

## 2016-05-27 DIAGNOSIS — Z9181 History of falling: Secondary | ICD-10-CM | POA: Diagnosis not present

## 2016-05-27 DIAGNOSIS — Z96651 Presence of right artificial knee joint: Secondary | ICD-10-CM | POA: Diagnosis not present

## 2016-05-27 DIAGNOSIS — L89153 Pressure ulcer of sacral region, stage 3: Secondary | ICD-10-CM | POA: Diagnosis not present

## 2016-05-27 DIAGNOSIS — I509 Heart failure, unspecified: Secondary | ICD-10-CM | POA: Diagnosis not present

## 2016-05-28 DIAGNOSIS — L89153 Pressure ulcer of sacral region, stage 3: Secondary | ICD-10-CM | POA: Diagnosis not present

## 2016-05-28 DIAGNOSIS — Z9181 History of falling: Secondary | ICD-10-CM | POA: Diagnosis not present

## 2016-05-28 DIAGNOSIS — Z96651 Presence of right artificial knee joint: Secondary | ICD-10-CM | POA: Diagnosis not present

## 2016-05-28 DIAGNOSIS — Z5181 Encounter for therapeutic drug level monitoring: Secondary | ICD-10-CM | POA: Diagnosis not present

## 2016-05-28 DIAGNOSIS — I509 Heart failure, unspecified: Secondary | ICD-10-CM | POA: Diagnosis not present

## 2016-05-28 DIAGNOSIS — R2689 Other abnormalities of gait and mobility: Secondary | ICD-10-CM | POA: Diagnosis not present

## 2016-05-28 DIAGNOSIS — Z794 Long term (current) use of insulin: Secondary | ICD-10-CM | POA: Diagnosis not present

## 2016-05-28 DIAGNOSIS — I110 Hypertensive heart disease with heart failure: Secondary | ICD-10-CM | POA: Diagnosis not present

## 2016-05-28 DIAGNOSIS — Z7901 Long term (current) use of anticoagulants: Secondary | ICD-10-CM | POA: Diagnosis not present

## 2016-05-28 DIAGNOSIS — Z4800 Encounter for change or removal of nonsurgical wound dressing: Secondary | ICD-10-CM | POA: Diagnosis not present

## 2016-05-28 DIAGNOSIS — E119 Type 2 diabetes mellitus without complications: Secondary | ICD-10-CM | POA: Diagnosis not present

## 2016-05-28 DIAGNOSIS — S72301K Unspecified fracture of shaft of right femur, subsequent encounter for closed fracture with nonunion: Secondary | ICD-10-CM | POA: Diagnosis not present

## 2016-05-28 DIAGNOSIS — R531 Weakness: Secondary | ICD-10-CM | POA: Diagnosis not present

## 2016-05-31 DIAGNOSIS — I509 Heart failure, unspecified: Secondary | ICD-10-CM | POA: Diagnosis not present

## 2016-05-31 DIAGNOSIS — R2689 Other abnormalities of gait and mobility: Secondary | ICD-10-CM | POA: Diagnosis not present

## 2016-05-31 DIAGNOSIS — Z5181 Encounter for therapeutic drug level monitoring: Secondary | ICD-10-CM | POA: Diagnosis not present

## 2016-05-31 DIAGNOSIS — Z96651 Presence of right artificial knee joint: Secondary | ICD-10-CM | POA: Diagnosis not present

## 2016-05-31 DIAGNOSIS — L89153 Pressure ulcer of sacral region, stage 3: Secondary | ICD-10-CM | POA: Diagnosis not present

## 2016-05-31 DIAGNOSIS — S72301K Unspecified fracture of shaft of right femur, subsequent encounter for closed fracture with nonunion: Secondary | ICD-10-CM | POA: Diagnosis not present

## 2016-05-31 DIAGNOSIS — I110 Hypertensive heart disease with heart failure: Secondary | ICD-10-CM | POA: Diagnosis not present

## 2016-05-31 DIAGNOSIS — Z9181 History of falling: Secondary | ICD-10-CM | POA: Diagnosis not present

## 2016-05-31 DIAGNOSIS — Z794 Long term (current) use of insulin: Secondary | ICD-10-CM | POA: Diagnosis not present

## 2016-05-31 DIAGNOSIS — Z7901 Long term (current) use of anticoagulants: Secondary | ICD-10-CM | POA: Diagnosis not present

## 2016-05-31 DIAGNOSIS — E119 Type 2 diabetes mellitus without complications: Secondary | ICD-10-CM | POA: Diagnosis not present

## 2016-05-31 DIAGNOSIS — Z4800 Encounter for change or removal of nonsurgical wound dressing: Secondary | ICD-10-CM | POA: Diagnosis not present

## 2016-05-31 DIAGNOSIS — R531 Weakness: Secondary | ICD-10-CM | POA: Diagnosis not present

## 2016-06-02 ENCOUNTER — Ambulatory Visit (INDEPENDENT_AMBULATORY_CARE_PROVIDER_SITE_OTHER): Payer: Medicare Other | Admitting: Pharmacist Clinician (PhC)/ Clinical Pharmacy Specialist

## 2016-06-02 DIAGNOSIS — L89153 Pressure ulcer of sacral region, stage 3: Secondary | ICD-10-CM | POA: Diagnosis not present

## 2016-06-02 DIAGNOSIS — Z96651 Presence of right artificial knee joint: Secondary | ICD-10-CM | POA: Diagnosis not present

## 2016-06-02 DIAGNOSIS — Z7901 Long term (current) use of anticoagulants: Secondary | ICD-10-CM

## 2016-06-02 DIAGNOSIS — Z4800 Encounter for change or removal of nonsurgical wound dressing: Secondary | ICD-10-CM | POA: Diagnosis not present

## 2016-06-02 DIAGNOSIS — Z794 Long term (current) use of insulin: Secondary | ICD-10-CM | POA: Diagnosis not present

## 2016-06-02 DIAGNOSIS — I110 Hypertensive heart disease with heart failure: Secondary | ICD-10-CM | POA: Diagnosis not present

## 2016-06-02 DIAGNOSIS — R531 Weakness: Secondary | ICD-10-CM | POA: Diagnosis not present

## 2016-06-02 DIAGNOSIS — I509 Heart failure, unspecified: Secondary | ICD-10-CM | POA: Diagnosis not present

## 2016-06-02 DIAGNOSIS — Z5181 Encounter for therapeutic drug level monitoring: Secondary | ICD-10-CM | POA: Diagnosis not present

## 2016-06-02 DIAGNOSIS — Z9181 History of falling: Secondary | ICD-10-CM | POA: Diagnosis not present

## 2016-06-02 DIAGNOSIS — E119 Type 2 diabetes mellitus without complications: Secondary | ICD-10-CM | POA: Diagnosis not present

## 2016-06-02 DIAGNOSIS — S72351D Displaced comminuted fracture of shaft of right femur, subsequent encounter for closed fracture with routine healing: Secondary | ICD-10-CM | POA: Diagnosis not present

## 2016-06-02 DIAGNOSIS — S72301K Unspecified fracture of shaft of right femur, subsequent encounter for closed fracture with nonunion: Secondary | ICD-10-CM | POA: Diagnosis not present

## 2016-06-02 DIAGNOSIS — R2689 Other abnormalities of gait and mobility: Secondary | ICD-10-CM | POA: Diagnosis not present

## 2016-06-02 LAB — POCT INR: INR: 2.9

## 2016-06-04 ENCOUNTER — Telehealth: Payer: Self-pay | Admitting: Family Medicine

## 2016-06-04 DIAGNOSIS — Z5181 Encounter for therapeutic drug level monitoring: Secondary | ICD-10-CM | POA: Diagnosis not present

## 2016-06-04 DIAGNOSIS — Z96651 Presence of right artificial knee joint: Secondary | ICD-10-CM | POA: Diagnosis not present

## 2016-06-04 DIAGNOSIS — I10 Essential (primary) hypertension: Secondary | ICD-10-CM | POA: Diagnosis not present

## 2016-06-04 DIAGNOSIS — Z7901 Long term (current) use of anticoagulants: Secondary | ICD-10-CM | POA: Diagnosis not present

## 2016-06-04 DIAGNOSIS — R2689 Other abnormalities of gait and mobility: Secondary | ICD-10-CM | POA: Diagnosis not present

## 2016-06-04 DIAGNOSIS — E119 Type 2 diabetes mellitus without complications: Secondary | ICD-10-CM | POA: Diagnosis not present

## 2016-06-04 DIAGNOSIS — E039 Hypothyroidism, unspecified: Secondary | ICD-10-CM | POA: Diagnosis not present

## 2016-06-04 DIAGNOSIS — S72301K Unspecified fracture of shaft of right femur, subsequent encounter for closed fracture with nonunion: Secondary | ICD-10-CM | POA: Diagnosis not present

## 2016-06-04 DIAGNOSIS — I509 Heart failure, unspecified: Secondary | ICD-10-CM | POA: Diagnosis not present

## 2016-06-04 DIAGNOSIS — Z794 Long term (current) use of insulin: Secondary | ICD-10-CM | POA: Diagnosis not present

## 2016-06-04 DIAGNOSIS — Z86010 Personal history of colonic polyps: Secondary | ICD-10-CM | POA: Diagnosis not present

## 2016-06-04 DIAGNOSIS — I5032 Chronic diastolic (congestive) heart failure: Secondary | ICD-10-CM | POA: Diagnosis not present

## 2016-06-04 DIAGNOSIS — M810 Age-related osteoporosis without current pathological fracture: Secondary | ICD-10-CM | POA: Diagnosis not present

## 2016-06-04 DIAGNOSIS — T814XXA Infection following a procedure, initial encounter: Secondary | ICD-10-CM | POA: Diagnosis not present

## 2016-06-04 DIAGNOSIS — Z9689 Presence of other specified functional implants: Secondary | ICD-10-CM | POA: Diagnosis not present

## 2016-06-04 DIAGNOSIS — E785 Hyperlipidemia, unspecified: Secondary | ICD-10-CM | POA: Diagnosis not present

## 2016-06-04 DIAGNOSIS — E1169 Type 2 diabetes mellitus with other specified complication: Secondary | ICD-10-CM | POA: Diagnosis not present

## 2016-06-04 DIAGNOSIS — M869 Osteomyelitis, unspecified: Secondary | ICD-10-CM | POA: Diagnosis not present

## 2016-06-04 DIAGNOSIS — T8149XA Infection following a procedure, other surgical site, initial encounter: Secondary | ICD-10-CM | POA: Insufficient documentation

## 2016-06-04 DIAGNOSIS — Z4800 Encounter for change or removal of nonsurgical wound dressing: Secondary | ICD-10-CM | POA: Diagnosis not present

## 2016-06-04 DIAGNOSIS — Z96641 Presence of right artificial hip joint: Secondary | ICD-10-CM | POA: Diagnosis not present

## 2016-06-04 DIAGNOSIS — B952 Enterococcus as the cause of diseases classified elsewhere: Secondary | ICD-10-CM | POA: Diagnosis not present

## 2016-06-04 DIAGNOSIS — T8451XA Infection and inflammatory reaction due to internal right hip prosthesis, initial encounter: Secondary | ICD-10-CM | POA: Diagnosis not present

## 2016-06-04 DIAGNOSIS — E1122 Type 2 diabetes mellitus with diabetic chronic kidney disease: Secondary | ICD-10-CM | POA: Diagnosis not present

## 2016-06-04 DIAGNOSIS — L89153 Pressure ulcer of sacral region, stage 3: Secondary | ICD-10-CM | POA: Diagnosis not present

## 2016-06-04 DIAGNOSIS — R531 Weakness: Secondary | ICD-10-CM | POA: Diagnosis not present

## 2016-06-04 DIAGNOSIS — Z950 Presence of cardiac pacemaker: Secondary | ICD-10-CM | POA: Diagnosis not present

## 2016-06-04 DIAGNOSIS — L0889 Other specified local infections of the skin and subcutaneous tissue: Secondary | ICD-10-CM | POA: Diagnosis not present

## 2016-06-04 DIAGNOSIS — T847XXA Infection and inflammatory reaction due to other internal orthopedic prosthetic devices, implants and grafts, initial encounter: Secondary | ICD-10-CM | POA: Insufficient documentation

## 2016-06-04 DIAGNOSIS — I110 Hypertensive heart disease with heart failure: Secondary | ICD-10-CM | POA: Diagnosis not present

## 2016-06-04 DIAGNOSIS — I130 Hypertensive heart and chronic kidney disease with heart failure and stage 1 through stage 4 chronic kidney disease, or unspecified chronic kidney disease: Secondary | ICD-10-CM | POA: Diagnosis not present

## 2016-06-04 DIAGNOSIS — Z9181 History of falling: Secondary | ICD-10-CM | POA: Diagnosis not present

## 2016-06-04 DIAGNOSIS — K219 Gastro-esophageal reflux disease without esophagitis: Secondary | ICD-10-CM | POA: Diagnosis not present

## 2016-06-04 DIAGNOSIS — N183 Chronic kidney disease, stage 3 (moderate): Secondary | ICD-10-CM | POA: Diagnosis not present

## 2016-06-04 DIAGNOSIS — Z8711 Personal history of peptic ulcer disease: Secondary | ICD-10-CM | POA: Diagnosis not present

## 2016-06-04 DIAGNOSIS — M868X6 Other osteomyelitis, lower leg: Secondary | ICD-10-CM | POA: Diagnosis not present

## 2016-06-04 DIAGNOSIS — Z792 Long term (current) use of antibiotics: Secondary | ICD-10-CM | POA: Diagnosis not present

## 2016-06-04 DIAGNOSIS — S72491D Other fracture of lower end of right femur, subsequent encounter for closed fracture with routine healing: Secondary | ICD-10-CM | POA: Diagnosis not present

## 2016-06-04 NOTE — Telephone Encounter (Signed)
Called and informed pt's daughter. She informed me that they found a spot at her incision site and she went to the orthopedist and they sent them to the hospital and they are going to take out her hardware and debride it and put the hardware back in. She said that they will be there for the next 3 days. Will fwd to pcp .[name] [place]

## 2016-06-04 NOTE — Telephone Encounter (Signed)
Call patient's daughter: The urinalysis done through [facility] not show any acute infection. Please see how she is feeling.

## 2016-06-05 ENCOUNTER — Other Ambulatory Visit: Payer: Self-pay | Admitting: Family Medicine

## 2016-06-08 ENCOUNTER — Telehealth: Payer: Self-pay | Admitting: Pharmacist Clinician (PhC)/ Clinical Pharmacy Specialist

## 2016-06-08 DIAGNOSIS — T814XXA Infection following a procedure, initial encounter: Secondary | ICD-10-CM | POA: Diagnosis not present

## 2016-06-08 NOTE — Telephone Encounter (Signed)
Remus Loffler NP at [facility] called to let us know that they would be discharging [name] today or tomorrow.  She will go home on IV antibiotics after infection in femur.  INR this am was 1.53 after warfatin restarted yesterday.   They will make arrangements for [facility] RN to draw INR at first home visit and call results directly to [place] for follow up

## 2016-06-09 ENCOUNTER — Ambulatory Visit (INDEPENDENT_AMBULATORY_CARE_PROVIDER_SITE_OTHER): Payer: Medicare Other | Admitting: Pharmacist

## 2016-06-09 DIAGNOSIS — Z9181 History of falling: Secondary | ICD-10-CM | POA: Diagnosis not present

## 2016-06-09 DIAGNOSIS — Z794 Long term (current) use of insulin: Secondary | ICD-10-CM | POA: Diagnosis not present

## 2016-06-09 DIAGNOSIS — S72301K Unspecified fracture of shaft of right femur, subsequent encounter for closed fracture with nonunion: Secondary | ICD-10-CM | POA: Diagnosis not present

## 2016-06-09 DIAGNOSIS — E119 Type 2 diabetes mellitus without complications: Secondary | ICD-10-CM | POA: Diagnosis not present

## 2016-06-09 DIAGNOSIS — L89153 Pressure ulcer of sacral region, stage 3: Secondary | ICD-10-CM | POA: Diagnosis not present

## 2016-06-09 DIAGNOSIS — Z7901 Long term (current) use of anticoagulants: Secondary | ICD-10-CM | POA: Diagnosis not present

## 2016-06-09 DIAGNOSIS — R2689 Other abnormalities of gait and mobility: Secondary | ICD-10-CM | POA: Diagnosis not present

## 2016-06-09 DIAGNOSIS — I509 Heart failure, unspecified: Secondary | ICD-10-CM | POA: Diagnosis not present

## 2016-06-09 DIAGNOSIS — I110 Hypertensive heart disease with heart failure: Secondary | ICD-10-CM | POA: Diagnosis not present

## 2016-06-09 DIAGNOSIS — R531 Weakness: Secondary | ICD-10-CM | POA: Diagnosis not present

## 2016-06-09 DIAGNOSIS — Z96651 Presence of right artificial knee joint: Secondary | ICD-10-CM | POA: Diagnosis not present

## 2016-06-09 DIAGNOSIS — Z4800 Encounter for change or removal of nonsurgical wound dressing: Secondary | ICD-10-CM | POA: Diagnosis not present

## 2016-06-09 DIAGNOSIS — Z5181 Encounter for therapeutic drug level monitoring: Secondary | ICD-10-CM | POA: Diagnosis not present

## 2016-06-09 LAB — POCT INR: INR: 2.2

## 2016-06-10 ENCOUNTER — Telehealth: Payer: Self-pay

## 2016-06-10 DIAGNOSIS — Z5181 Encounter for therapeutic drug level monitoring: Secondary | ICD-10-CM | POA: Diagnosis not present

## 2016-06-10 DIAGNOSIS — Z4800 Encounter for change or removal of nonsurgical wound dressing: Secondary | ICD-10-CM | POA: Diagnosis not present

## 2016-06-10 DIAGNOSIS — Z9181 History of falling: Secondary | ICD-10-CM | POA: Diagnosis not present

## 2016-06-10 DIAGNOSIS — Z7901 Long term (current) use of anticoagulants: Secondary | ICD-10-CM | POA: Diagnosis not present

## 2016-06-10 DIAGNOSIS — E119 Type 2 diabetes mellitus without complications: Secondary | ICD-10-CM | POA: Diagnosis not present

## 2016-06-10 DIAGNOSIS — S72301K Unspecified fracture of shaft of right femur, subsequent encounter for closed fracture with nonunion: Secondary | ICD-10-CM | POA: Diagnosis not present

## 2016-06-10 DIAGNOSIS — Z96651 Presence of right artificial knee joint: Secondary | ICD-10-CM | POA: Diagnosis not present

## 2016-06-10 DIAGNOSIS — R2689 Other abnormalities of gait and mobility: Secondary | ICD-10-CM | POA: Diagnosis not present

## 2016-06-10 DIAGNOSIS — R531 Weakness: Secondary | ICD-10-CM | POA: Diagnosis not present

## 2016-06-10 DIAGNOSIS — I110 Hypertensive heart disease with heart failure: Secondary | ICD-10-CM | POA: Diagnosis not present

## 2016-06-10 DIAGNOSIS — L89153 Pressure ulcer of sacral region, stage 3: Secondary | ICD-10-CM | POA: Diagnosis not present

## 2016-06-10 DIAGNOSIS — Z794 Long term (current) use of insulin: Secondary | ICD-10-CM | POA: Diagnosis not present

## 2016-06-10 DIAGNOSIS — I509 Heart failure, unspecified: Secondary | ICD-10-CM | POA: Diagnosis not present

## 2016-06-10 NOTE — Telephone Encounter (Signed)
Ok for order?  

## 2016-06-11 DIAGNOSIS — Z8679 Personal history of other diseases of the circulatory system: Secondary | ICD-10-CM | POA: Diagnosis not present

## 2016-06-11 DIAGNOSIS — D72829 Elevated white blood cell count, unspecified: Secondary | ICD-10-CM | POA: Diagnosis not present

## 2016-06-11 DIAGNOSIS — Z96651 Presence of right artificial knee joint: Secondary | ICD-10-CM | POA: Diagnosis not present

## 2016-06-11 DIAGNOSIS — Z794 Long term (current) use of insulin: Secondary | ICD-10-CM | POA: Diagnosis not present

## 2016-06-11 DIAGNOSIS — R2689 Other abnormalities of gait and mobility: Secondary | ICD-10-CM | POA: Diagnosis not present

## 2016-06-11 DIAGNOSIS — Z8619 Personal history of other infectious and parasitic diseases: Secondary | ICD-10-CM | POA: Diagnosis not present

## 2016-06-11 DIAGNOSIS — S72301K Unspecified fracture of shaft of right femur, subsequent encounter for closed fracture with nonunion: Secondary | ICD-10-CM | POA: Diagnosis not present

## 2016-06-11 DIAGNOSIS — S40021A Contusion of right upper arm, initial encounter: Secondary | ICD-10-CM | POA: Diagnosis not present

## 2016-06-11 DIAGNOSIS — I110 Hypertensive heart disease with heart failure: Secondary | ICD-10-CM | POA: Diagnosis not present

## 2016-06-11 DIAGNOSIS — S4991XA Unspecified injury of right shoulder and upper arm, initial encounter: Secondary | ICD-10-CM | POA: Diagnosis not present

## 2016-06-11 DIAGNOSIS — Z4800 Encounter for change or removal of nonsurgical wound dressing: Secondary | ICD-10-CM | POA: Diagnosis not present

## 2016-06-11 DIAGNOSIS — Z959 Presence of cardiac and vascular implant and graft, unspecified: Secondary | ICD-10-CM | POA: Diagnosis not present

## 2016-06-11 DIAGNOSIS — Z8639 Personal history of other endocrine, nutritional and metabolic disease: Secondary | ICD-10-CM | POA: Diagnosis not present

## 2016-06-11 DIAGNOSIS — I509 Heart failure, unspecified: Secondary | ICD-10-CM | POA: Diagnosis not present

## 2016-06-11 DIAGNOSIS — Z9181 History of falling: Secondary | ICD-10-CM | POA: Diagnosis not present

## 2016-06-11 DIAGNOSIS — Z792 Long term (current) use of antibiotics: Secondary | ICD-10-CM | POA: Diagnosis not present

## 2016-06-11 DIAGNOSIS — J90 Pleural effusion, not elsewhere classified: Secondary | ICD-10-CM | POA: Diagnosis not present

## 2016-06-11 DIAGNOSIS — L89153 Pressure ulcer of sacral region, stage 3: Secondary | ICD-10-CM | POA: Diagnosis not present

## 2016-06-11 DIAGNOSIS — R531 Weakness: Secondary | ICD-10-CM | POA: Diagnosis not present

## 2016-06-11 DIAGNOSIS — Z452 Encounter for adjustment and management of vascular access device: Secondary | ICD-10-CM | POA: Diagnosis not present

## 2016-06-11 DIAGNOSIS — E119 Type 2 diabetes mellitus without complications: Secondary | ICD-10-CM | POA: Diagnosis not present

## 2016-06-11 DIAGNOSIS — Z5181 Encounter for therapeutic drug level monitoring: Secondary | ICD-10-CM | POA: Diagnosis not present

## 2016-06-11 DIAGNOSIS — Z7901 Long term (current) use of anticoagulants: Secondary | ICD-10-CM | POA: Diagnosis not present

## 2016-06-11 DIAGNOSIS — Z9689 Presence of other specified functional implants: Secondary | ICD-10-CM | POA: Diagnosis not present

## 2016-06-11 NOTE — Telephone Encounter (Signed)
Notified [name] at [facility].

## 2016-06-12 DIAGNOSIS — S72301K Unspecified fracture of shaft of right femur, subsequent encounter for closed fracture with nonunion: Secondary | ICD-10-CM | POA: Diagnosis not present

## 2016-06-12 DIAGNOSIS — I509 Heart failure, unspecified: Secondary | ICD-10-CM | POA: Diagnosis not present

## 2016-06-12 DIAGNOSIS — R531 Weakness: Secondary | ICD-10-CM | POA: Diagnosis not present

## 2016-06-12 DIAGNOSIS — J90 Pleural effusion, not elsewhere classified: Secondary | ICD-10-CM | POA: Diagnosis not present

## 2016-06-12 DIAGNOSIS — Z794 Long term (current) use of insulin: Secondary | ICD-10-CM | POA: Diagnosis not present

## 2016-06-12 DIAGNOSIS — L89153 Pressure ulcer of sacral region, stage 3: Secondary | ICD-10-CM | POA: Diagnosis not present

## 2016-06-12 DIAGNOSIS — Z9181 History of falling: Secondary | ICD-10-CM | POA: Diagnosis not present

## 2016-06-12 DIAGNOSIS — Z7901 Long term (current) use of anticoagulants: Secondary | ICD-10-CM | POA: Diagnosis not present

## 2016-06-12 DIAGNOSIS — Z452 Encounter for adjustment and management of vascular access device: Secondary | ICD-10-CM | POA: Diagnosis not present

## 2016-06-12 DIAGNOSIS — E119 Type 2 diabetes mellitus without complications: Secondary | ICD-10-CM | POA: Diagnosis not present

## 2016-06-12 DIAGNOSIS — R2689 Other abnormalities of gait and mobility: Secondary | ICD-10-CM | POA: Diagnosis not present

## 2016-06-12 DIAGNOSIS — Z5181 Encounter for therapeutic drug level monitoring: Secondary | ICD-10-CM | POA: Diagnosis not present

## 2016-06-12 DIAGNOSIS — I110 Hypertensive heart disease with heart failure: Secondary | ICD-10-CM | POA: Diagnosis not present

## 2016-06-12 DIAGNOSIS — Z4800 Encounter for change or removal of nonsurgical wound dressing: Secondary | ICD-10-CM | POA: Diagnosis not present

## 2016-06-12 DIAGNOSIS — Z96651 Presence of right artificial knee joint: Secondary | ICD-10-CM | POA: Diagnosis not present

## 2016-06-14 ENCOUNTER — Encounter: Payer: Self-pay | Admitting: Family Medicine

## 2016-06-14 DIAGNOSIS — T814XXA Infection following a procedure, initial encounter: Secondary | ICD-10-CM | POA: Diagnosis not present

## 2016-06-14 DIAGNOSIS — Z9181 History of falling: Secondary | ICD-10-CM | POA: Diagnosis not present

## 2016-06-14 DIAGNOSIS — I509 Heart failure, unspecified: Secondary | ICD-10-CM | POA: Diagnosis not present

## 2016-06-14 DIAGNOSIS — Z7901 Long term (current) use of anticoagulants: Secondary | ICD-10-CM | POA: Diagnosis not present

## 2016-06-14 DIAGNOSIS — R2689 Other abnormalities of gait and mobility: Secondary | ICD-10-CM | POA: Diagnosis not present

## 2016-06-14 DIAGNOSIS — Z96651 Presence of right artificial knee joint: Secondary | ICD-10-CM | POA: Diagnosis not present

## 2016-06-14 DIAGNOSIS — S72301K Unspecified fracture of shaft of right femur, subsequent encounter for closed fracture with nonunion: Secondary | ICD-10-CM | POA: Diagnosis not present

## 2016-06-14 DIAGNOSIS — Z794 Long term (current) use of insulin: Secondary | ICD-10-CM | POA: Diagnosis not present

## 2016-06-14 DIAGNOSIS — Z5181 Encounter for therapeutic drug level monitoring: Secondary | ICD-10-CM | POA: Diagnosis not present

## 2016-06-14 DIAGNOSIS — L89153 Pressure ulcer of sacral region, stage 3: Secondary | ICD-10-CM | POA: Diagnosis not present

## 2016-06-14 DIAGNOSIS — S72351D Displaced comminuted fracture of shaft of right femur, subsequent encounter for closed fracture with routine healing: Secondary | ICD-10-CM | POA: Diagnosis not present

## 2016-06-14 DIAGNOSIS — E119 Type 2 diabetes mellitus without complications: Secondary | ICD-10-CM | POA: Diagnosis not present

## 2016-06-14 DIAGNOSIS — I110 Hypertensive heart disease with heart failure: Secondary | ICD-10-CM | POA: Diagnosis not present

## 2016-06-14 DIAGNOSIS — Z4800 Encounter for change or removal of nonsurgical wound dressing: Secondary | ICD-10-CM | POA: Diagnosis not present

## 2016-06-14 DIAGNOSIS — R531 Weakness: Secondary | ICD-10-CM | POA: Diagnosis not present

## 2016-06-14 LAB — CBC AND DIFFERENTIAL
HCT: 32 % — AB (ref 36–46)
HEMOGLOBIN: 10.4 g/dL — AB (ref 12.0–16.0)
Platelets: 380 10*3/uL (ref 150–399)
WBC: 8.8 10^3/mL

## 2016-06-14 LAB — BASIC METABOLIC PANEL: BUN: 8 mg/dL (ref 4–21)

## 2016-06-15 DIAGNOSIS — T814XXA Infection following a procedure, initial encounter: Secondary | ICD-10-CM | POA: Diagnosis not present

## 2016-06-16 DIAGNOSIS — Z452 Encounter for adjustment and management of vascular access device: Secondary | ICD-10-CM | POA: Diagnosis not present

## 2016-06-16 DIAGNOSIS — S72301K Unspecified fracture of shaft of right femur, subsequent encounter for closed fracture with nonunion: Secondary | ICD-10-CM | POA: Diagnosis not present

## 2016-06-16 DIAGNOSIS — Z9181 History of falling: Secondary | ICD-10-CM | POA: Diagnosis not present

## 2016-06-16 DIAGNOSIS — I110 Hypertensive heart disease with heart failure: Secondary | ICD-10-CM | POA: Diagnosis not present

## 2016-06-16 DIAGNOSIS — E119 Type 2 diabetes mellitus without complications: Secondary | ICD-10-CM | POA: Diagnosis not present

## 2016-06-16 DIAGNOSIS — Z4800 Encounter for change or removal of nonsurgical wound dressing: Secondary | ICD-10-CM | POA: Diagnosis not present

## 2016-06-16 DIAGNOSIS — L89153 Pressure ulcer of sacral region, stage 3: Secondary | ICD-10-CM | POA: Diagnosis not present

## 2016-06-16 DIAGNOSIS — R531 Weakness: Secondary | ICD-10-CM | POA: Diagnosis not present

## 2016-06-16 DIAGNOSIS — Z7901 Long term (current) use of anticoagulants: Secondary | ICD-10-CM | POA: Diagnosis not present

## 2016-06-16 DIAGNOSIS — Z96651 Presence of right artificial knee joint: Secondary | ICD-10-CM | POA: Diagnosis not present

## 2016-06-16 DIAGNOSIS — I509 Heart failure, unspecified: Secondary | ICD-10-CM | POA: Diagnosis not present

## 2016-06-16 DIAGNOSIS — Z5181 Encounter for therapeutic drug level monitoring: Secondary | ICD-10-CM | POA: Diagnosis not present

## 2016-06-16 DIAGNOSIS — Z794 Long term (current) use of insulin: Secondary | ICD-10-CM | POA: Diagnosis not present

## 2016-06-16 DIAGNOSIS — R2689 Other abnormalities of gait and mobility: Secondary | ICD-10-CM | POA: Diagnosis not present

## 2016-06-16 LAB — PROTIME-INR: INR: 1.2 — AB (ref 0.9–1.1)

## 2016-06-17 ENCOUNTER — Ambulatory Visit (INDEPENDENT_AMBULATORY_CARE_PROVIDER_SITE_OTHER): Payer: Medicare Other | Admitting: Pharmacist Clinician (PhC)/ Clinical Pharmacy Specialist

## 2016-06-17 ENCOUNTER — Other Ambulatory Visit: Payer: Self-pay | Admitting: Family Medicine

## 2016-06-17 DIAGNOSIS — Z7901 Long term (current) use of anticoagulants: Secondary | ICD-10-CM

## 2016-06-21 DIAGNOSIS — Z7901 Long term (current) use of anticoagulants: Secondary | ICD-10-CM | POA: Diagnosis not present

## 2016-06-21 DIAGNOSIS — Z9181 History of falling: Secondary | ICD-10-CM | POA: Diagnosis not present

## 2016-06-21 DIAGNOSIS — T8451XD Infection and inflammatory reaction due to internal right hip prosthesis, subsequent encounter: Secondary | ICD-10-CM | POA: Diagnosis not present

## 2016-06-21 DIAGNOSIS — R2689 Other abnormalities of gait and mobility: Secondary | ICD-10-CM | POA: Diagnosis not present

## 2016-06-21 DIAGNOSIS — R531 Weakness: Secondary | ICD-10-CM | POA: Diagnosis not present

## 2016-06-21 DIAGNOSIS — Z96651 Presence of right artificial knee joint: Secondary | ICD-10-CM | POA: Diagnosis not present

## 2016-06-21 DIAGNOSIS — Z452 Encounter for adjustment and management of vascular access device: Secondary | ICD-10-CM | POA: Diagnosis not present

## 2016-06-21 DIAGNOSIS — I509 Heart failure, unspecified: Secondary | ICD-10-CM | POA: Diagnosis not present

## 2016-06-21 DIAGNOSIS — Z5181 Encounter for therapeutic drug level monitoring: Secondary | ICD-10-CM | POA: Diagnosis not present

## 2016-06-21 DIAGNOSIS — I110 Hypertensive heart disease with heart failure: Secondary | ICD-10-CM | POA: Diagnosis not present

## 2016-06-21 DIAGNOSIS — T8451XA Infection and inflammatory reaction due to internal right hip prosthesis, initial encounter: Secondary | ICD-10-CM | POA: Diagnosis not present

## 2016-06-21 DIAGNOSIS — E119 Type 2 diabetes mellitus without complications: Secondary | ICD-10-CM | POA: Diagnosis not present

## 2016-06-21 DIAGNOSIS — Z792 Long term (current) use of antibiotics: Secondary | ICD-10-CM | POA: Diagnosis not present

## 2016-06-21 DIAGNOSIS — M869 Osteomyelitis, unspecified: Secondary | ICD-10-CM | POA: Diagnosis not present

## 2016-06-21 LAB — BASIC METABOLIC PANEL
BUN: 11 mg/dL (ref 4–21)
Creatinine: 1 mg/dL (ref 0.5–1.1)

## 2016-06-21 LAB — CBC AND DIFFERENTIAL
HEMATOCRIT: 42 % (ref 36–46)
Hemoglobin: 13.4 g/dL (ref 12.0–16.0)
Neutrophils Absolute: 8 /uL
PLATELETS: 506 10*3/uL — AB (ref 150–399)
WBC: 11.7 10*3/mL

## 2016-06-21 LAB — C-REACTIVE PROTEIN, QUANT: CRP: 1.6

## 2016-06-22 ENCOUNTER — Telehealth: Payer: Self-pay | Admitting: Family Medicine

## 2016-06-22 DIAGNOSIS — Z96651 Presence of right artificial knee joint: Secondary | ICD-10-CM | POA: Diagnosis not present

## 2016-06-22 DIAGNOSIS — T8451XD Infection and inflammatory reaction due to internal right hip prosthesis, subsequent encounter: Secondary | ICD-10-CM | POA: Diagnosis not present

## 2016-06-22 DIAGNOSIS — I509 Heart failure, unspecified: Secondary | ICD-10-CM | POA: Diagnosis not present

## 2016-06-22 DIAGNOSIS — M869 Osteomyelitis, unspecified: Secondary | ICD-10-CM | POA: Diagnosis not present

## 2016-06-22 DIAGNOSIS — R2689 Other abnormalities of gait and mobility: Secondary | ICD-10-CM | POA: Diagnosis not present

## 2016-06-22 DIAGNOSIS — E119 Type 2 diabetes mellitus without complications: Secondary | ICD-10-CM | POA: Diagnosis not present

## 2016-06-22 DIAGNOSIS — I110 Hypertensive heart disease with heart failure: Secondary | ICD-10-CM | POA: Diagnosis not present

## 2016-06-22 DIAGNOSIS — Z792 Long term (current) use of antibiotics: Secondary | ICD-10-CM | POA: Diagnosis not present

## 2016-06-22 DIAGNOSIS — Z5181 Encounter for therapeutic drug level monitoring: Secondary | ICD-10-CM | POA: Diagnosis not present

## 2016-06-22 DIAGNOSIS — Z9181 History of falling: Secondary | ICD-10-CM | POA: Diagnosis not present

## 2016-06-22 DIAGNOSIS — Z7901 Long term (current) use of anticoagulants: Secondary | ICD-10-CM | POA: Diagnosis not present

## 2016-06-22 DIAGNOSIS — R531 Weakness: Secondary | ICD-10-CM | POA: Diagnosis not present

## 2016-06-22 DIAGNOSIS — T814XXA Infection following a procedure, initial encounter: Secondary | ICD-10-CM | POA: Diagnosis not present

## 2016-06-22 DIAGNOSIS — Z452 Encounter for adjustment and management of vascular access device: Secondary | ICD-10-CM | POA: Diagnosis not present

## 2016-06-22 NOTE — Telephone Encounter (Signed)
Call patient: Hemoglobin is a little bit low at 10.4. This is not surprising after surgery. One of the inflammatory markers is elevated and one is normal. INR was 1.2

## 2016-06-23 ENCOUNTER — Encounter: Payer: Self-pay | Admitting: Family Medicine

## 2016-06-23 ENCOUNTER — Telehealth: Payer: Self-pay | Admitting: Family Medicine

## 2016-06-23 ENCOUNTER — Ambulatory Visit (INDEPENDENT_AMBULATORY_CARE_PROVIDER_SITE_OTHER): Payer: Medicare Other | Admitting: Pharmacist

## 2016-06-23 DIAGNOSIS — T8451XD Infection and inflammatory reaction due to internal right hip prosthesis, subsequent encounter: Secondary | ICD-10-CM | POA: Diagnosis not present

## 2016-06-23 DIAGNOSIS — E785 Hyperlipidemia, unspecified: Secondary | ICD-10-CM | POA: Diagnosis not present

## 2016-06-23 DIAGNOSIS — Z9181 History of falling: Secondary | ICD-10-CM | POA: Diagnosis not present

## 2016-06-23 DIAGNOSIS — Z959 Presence of cardiac and vascular implant and graft, unspecified: Secondary | ICD-10-CM | POA: Diagnosis not present

## 2016-06-23 DIAGNOSIS — I509 Heart failure, unspecified: Secondary | ICD-10-CM | POA: Diagnosis not present

## 2016-06-23 DIAGNOSIS — Z5181 Encounter for therapeutic drug level monitoring: Secondary | ICD-10-CM | POA: Diagnosis not present

## 2016-06-23 DIAGNOSIS — E039 Hypothyroidism, unspecified: Secondary | ICD-10-CM | POA: Diagnosis not present

## 2016-06-23 DIAGNOSIS — M868X6 Other osteomyelitis, lower leg: Secondary | ICD-10-CM | POA: Diagnosis not present

## 2016-06-23 DIAGNOSIS — B9562 Methicillin resistant Staphylococcus aureus infection as the cause of diseases classified elsewhere: Secondary | ICD-10-CM | POA: Diagnosis not present

## 2016-06-23 DIAGNOSIS — N183 Chronic kidney disease, stage 3 (moderate): Secondary | ICD-10-CM | POA: Diagnosis not present

## 2016-06-23 DIAGNOSIS — T814XXD Infection following a procedure, subsequent encounter: Secondary | ICD-10-CM | POA: Diagnosis not present

## 2016-06-23 DIAGNOSIS — Z888 Allergy status to other drugs, medicaments and biological substances status: Secondary | ICD-10-CM | POA: Diagnosis not present

## 2016-06-23 DIAGNOSIS — Z792 Long term (current) use of antibiotics: Secondary | ICD-10-CM | POA: Diagnosis not present

## 2016-06-23 DIAGNOSIS — Z794 Long term (current) use of insulin: Secondary | ICD-10-CM | POA: Diagnosis not present

## 2016-06-23 DIAGNOSIS — E1140 Type 2 diabetes mellitus with diabetic neuropathy, unspecified: Secondary | ICD-10-CM | POA: Diagnosis not present

## 2016-06-23 DIAGNOSIS — Z7901 Long term (current) use of anticoagulants: Secondary | ICD-10-CM | POA: Diagnosis not present

## 2016-06-23 DIAGNOSIS — Z79899 Other long term (current) drug therapy: Secondary | ICD-10-CM | POA: Diagnosis not present

## 2016-06-23 DIAGNOSIS — E1122 Type 2 diabetes mellitus with diabetic chronic kidney disease: Secondary | ICD-10-CM | POA: Diagnosis not present

## 2016-06-23 DIAGNOSIS — E119 Type 2 diabetes mellitus without complications: Secondary | ICD-10-CM | POA: Diagnosis not present

## 2016-06-23 DIAGNOSIS — M869 Osteomyelitis, unspecified: Secondary | ICD-10-CM | POA: Diagnosis not present

## 2016-06-23 DIAGNOSIS — Z96651 Presence of right artificial knee joint: Secondary | ICD-10-CM | POA: Diagnosis not present

## 2016-06-23 DIAGNOSIS — Z09 Encounter for follow-up examination after completed treatment for conditions other than malignant neoplasm: Secondary | ICD-10-CM | POA: Diagnosis not present

## 2016-06-23 DIAGNOSIS — I110 Hypertensive heart disease with heart failure: Secondary | ICD-10-CM | POA: Diagnosis not present

## 2016-06-23 DIAGNOSIS — I480 Paroxysmal atrial fibrillation: Secondary | ICD-10-CM | POA: Diagnosis not present

## 2016-06-23 DIAGNOSIS — Z452 Encounter for adjustment and management of vascular access device: Secondary | ICD-10-CM | POA: Diagnosis not present

## 2016-06-23 DIAGNOSIS — R2689 Other abnormalities of gait and mobility: Secondary | ICD-10-CM | POA: Diagnosis not present

## 2016-06-23 DIAGNOSIS — I129 Hypertensive chronic kidney disease with stage 1 through stage 4 chronic kidney disease, or unspecified chronic kidney disease: Secondary | ICD-10-CM | POA: Diagnosis not present

## 2016-06-23 DIAGNOSIS — R531 Weakness: Secondary | ICD-10-CM | POA: Diagnosis not present

## 2016-06-23 LAB — PROTIME-INR: INR: 1.7 — AB (ref <=1.1)

## 2016-06-23 NOTE — Telephone Encounter (Signed)
Numerous labs from June 26 showed that the hemoglobin is actually back up to 13.4 which is fantastic. White blood cell counts just borderline elevated. Kidney function is stable.

## 2016-06-23 NOTE — Telephone Encounter (Signed)
[name] informed.[name] [place]  

## 2016-06-23 NOTE — Telephone Encounter (Signed)
[name] informed.[name] [name]  

## 2016-06-24 DIAGNOSIS — I110 Hypertensive heart disease with heart failure: Secondary | ICD-10-CM | POA: Diagnosis not present

## 2016-06-24 DIAGNOSIS — Z452 Encounter for adjustment and management of vascular access device: Secondary | ICD-10-CM | POA: Diagnosis not present

## 2016-06-24 DIAGNOSIS — Z5181 Encounter for therapeutic drug level monitoring: Secondary | ICD-10-CM | POA: Diagnosis not present

## 2016-06-24 DIAGNOSIS — Z7901 Long term (current) use of anticoagulants: Secondary | ICD-10-CM | POA: Diagnosis not present

## 2016-06-24 DIAGNOSIS — E119 Type 2 diabetes mellitus without complications: Secondary | ICD-10-CM | POA: Diagnosis not present

## 2016-06-24 DIAGNOSIS — I509 Heart failure, unspecified: Secondary | ICD-10-CM | POA: Diagnosis not present

## 2016-06-24 DIAGNOSIS — M869 Osteomyelitis, unspecified: Secondary | ICD-10-CM | POA: Diagnosis not present

## 2016-06-24 DIAGNOSIS — Z96651 Presence of right artificial knee joint: Secondary | ICD-10-CM | POA: Diagnosis not present

## 2016-06-24 DIAGNOSIS — Z9181 History of falling: Secondary | ICD-10-CM | POA: Diagnosis not present

## 2016-06-24 DIAGNOSIS — R2689 Other abnormalities of gait and mobility: Secondary | ICD-10-CM | POA: Diagnosis not present

## 2016-06-24 DIAGNOSIS — T8451XD Infection and inflammatory reaction due to internal right hip prosthesis, subsequent encounter: Secondary | ICD-10-CM | POA: Diagnosis not present

## 2016-06-24 DIAGNOSIS — R531 Weakness: Secondary | ICD-10-CM | POA: Diagnosis not present

## 2016-06-24 DIAGNOSIS — Z792 Long term (current) use of antibiotics: Secondary | ICD-10-CM | POA: Diagnosis not present

## 2016-06-25 DIAGNOSIS — R2689 Other abnormalities of gait and mobility: Secondary | ICD-10-CM | POA: Diagnosis not present

## 2016-06-25 DIAGNOSIS — I110 Hypertensive heart disease with heart failure: Secondary | ICD-10-CM | POA: Diagnosis not present

## 2016-06-25 DIAGNOSIS — R531 Weakness: Secondary | ICD-10-CM | POA: Diagnosis not present

## 2016-06-25 DIAGNOSIS — Z7901 Long term (current) use of anticoagulants: Secondary | ICD-10-CM | POA: Diagnosis not present

## 2016-06-25 DIAGNOSIS — M869 Osteomyelitis, unspecified: Secondary | ICD-10-CM | POA: Diagnosis not present

## 2016-06-25 DIAGNOSIS — I509 Heart failure, unspecified: Secondary | ICD-10-CM | POA: Diagnosis not present

## 2016-06-25 DIAGNOSIS — Z792 Long term (current) use of antibiotics: Secondary | ICD-10-CM | POA: Diagnosis not present

## 2016-06-25 DIAGNOSIS — T8451XD Infection and inflammatory reaction due to internal right hip prosthesis, subsequent encounter: Secondary | ICD-10-CM | POA: Diagnosis not present

## 2016-06-25 DIAGNOSIS — Z9181 History of falling: Secondary | ICD-10-CM | POA: Diagnosis not present

## 2016-06-25 DIAGNOSIS — Z5181 Encounter for therapeutic drug level monitoring: Secondary | ICD-10-CM | POA: Diagnosis not present

## 2016-06-25 DIAGNOSIS — Z452 Encounter for adjustment and management of vascular access device: Secondary | ICD-10-CM | POA: Diagnosis not present

## 2016-06-25 DIAGNOSIS — Z96651 Presence of right artificial knee joint: Secondary | ICD-10-CM | POA: Diagnosis not present

## 2016-06-25 DIAGNOSIS — E119 Type 2 diabetes mellitus without complications: Secondary | ICD-10-CM | POA: Diagnosis not present

## 2016-06-26 DIAGNOSIS — M869 Osteomyelitis, unspecified: Secondary | ICD-10-CM | POA: Insufficient documentation

## 2016-06-28 ENCOUNTER — Encounter: Payer: Self-pay | Admitting: Family Medicine

## 2016-06-28 DIAGNOSIS — I110 Hypertensive heart disease with heart failure: Secondary | ICD-10-CM | POA: Diagnosis not present

## 2016-06-28 DIAGNOSIS — E119 Type 2 diabetes mellitus without complications: Secondary | ICD-10-CM | POA: Diagnosis not present

## 2016-06-28 DIAGNOSIS — Z5181 Encounter for therapeutic drug level monitoring: Secondary | ICD-10-CM | POA: Diagnosis not present

## 2016-06-28 DIAGNOSIS — Z9181 History of falling: Secondary | ICD-10-CM | POA: Diagnosis not present

## 2016-06-28 DIAGNOSIS — M869 Osteomyelitis, unspecified: Secondary | ICD-10-CM | POA: Diagnosis not present

## 2016-06-28 DIAGNOSIS — Z792 Long term (current) use of antibiotics: Secondary | ICD-10-CM | POA: Diagnosis not present

## 2016-06-28 DIAGNOSIS — Z96651 Presence of right artificial knee joint: Secondary | ICD-10-CM | POA: Diagnosis not present

## 2016-06-28 DIAGNOSIS — R2689 Other abnormalities of gait and mobility: Secondary | ICD-10-CM | POA: Diagnosis not present

## 2016-06-28 DIAGNOSIS — I509 Heart failure, unspecified: Secondary | ICD-10-CM | POA: Diagnosis not present

## 2016-06-28 DIAGNOSIS — R531 Weakness: Secondary | ICD-10-CM | POA: Diagnosis not present

## 2016-06-28 DIAGNOSIS — T8451XD Infection and inflammatory reaction due to internal right hip prosthesis, subsequent encounter: Secondary | ICD-10-CM | POA: Diagnosis not present

## 2016-06-28 DIAGNOSIS — Z452 Encounter for adjustment and management of vascular access device: Secondary | ICD-10-CM | POA: Diagnosis not present

## 2016-06-28 DIAGNOSIS — Z7901 Long term (current) use of anticoagulants: Secondary | ICD-10-CM | POA: Diagnosis not present

## 2016-06-28 LAB — POCT ERYTHROCYTE SEDIMENTATION RATE, NON-AUTOMATED: Sed Rate: 33 mm

## 2016-06-28 LAB — CBC AND DIFFERENTIAL
HCT: 39 % (ref 36–46)
HEMOGLOBIN: 12.8 g/dL (ref 12.0–16.0)
NEUTROS ABS: 7 /uL
PLATELETS: 358 10*3/uL (ref 150–399)
WBC: 9.5 10^3/mL

## 2016-06-28 LAB — BASIC METABOLIC PANEL: BUN: 12 mg/dL (ref 4–21)

## 2016-06-29 DIAGNOSIS — T814XXA Infection following a procedure, initial encounter: Secondary | ICD-10-CM | POA: Diagnosis not present

## 2016-06-30 ENCOUNTER — Ambulatory Visit (INDEPENDENT_AMBULATORY_CARE_PROVIDER_SITE_OTHER): Payer: Medicare Other | Admitting: Pharmacist

## 2016-06-30 DIAGNOSIS — Z794 Long term (current) use of insulin: Secondary | ICD-10-CM | POA: Diagnosis not present

## 2016-06-30 DIAGNOSIS — R2689 Other abnormalities of gait and mobility: Secondary | ICD-10-CM | POA: Diagnosis not present

## 2016-06-30 DIAGNOSIS — Z5181 Encounter for therapeutic drug level monitoring: Secondary | ICD-10-CM | POA: Diagnosis not present

## 2016-06-30 DIAGNOSIS — Z452 Encounter for adjustment and management of vascular access device: Secondary | ICD-10-CM | POA: Diagnosis not present

## 2016-06-30 DIAGNOSIS — Z7901 Long term (current) use of anticoagulants: Secondary | ICD-10-CM

## 2016-06-30 DIAGNOSIS — Z950 Presence of cardiac pacemaker: Secondary | ICD-10-CM | POA: Diagnosis not present

## 2016-06-30 DIAGNOSIS — I4891 Unspecified atrial fibrillation: Secondary | ICD-10-CM | POA: Diagnosis not present

## 2016-06-30 DIAGNOSIS — Z96651 Presence of right artificial knee joint: Secondary | ICD-10-CM | POA: Diagnosis not present

## 2016-06-30 DIAGNOSIS — T8451XD Infection and inflammatory reaction due to internal right hip prosthesis, subsequent encounter: Secondary | ICD-10-CM | POA: Diagnosis not present

## 2016-06-30 DIAGNOSIS — E119 Type 2 diabetes mellitus without complications: Secondary | ICD-10-CM | POA: Diagnosis not present

## 2016-06-30 DIAGNOSIS — M869 Osteomyelitis, unspecified: Secondary | ICD-10-CM | POA: Diagnosis not present

## 2016-06-30 DIAGNOSIS — Z79899 Other long term (current) drug therapy: Secondary | ICD-10-CM | POA: Diagnosis not present

## 2016-06-30 DIAGNOSIS — I110 Hypertensive heart disease with heart failure: Secondary | ICD-10-CM | POA: Diagnosis not present

## 2016-06-30 DIAGNOSIS — R531 Weakness: Secondary | ICD-10-CM | POA: Diagnosis not present

## 2016-06-30 DIAGNOSIS — Z888 Allergy status to other drugs, medicaments and biological substances status: Secondary | ICD-10-CM | POA: Diagnosis not present

## 2016-06-30 DIAGNOSIS — I119 Hypertensive heart disease without heart failure: Secondary | ICD-10-CM | POA: Diagnosis not present

## 2016-06-30 DIAGNOSIS — E785 Hyperlipidemia, unspecified: Secondary | ICD-10-CM | POA: Diagnosis not present

## 2016-06-30 DIAGNOSIS — I509 Heart failure, unspecified: Secondary | ICD-10-CM | POA: Diagnosis not present

## 2016-06-30 DIAGNOSIS — S72301D Unspecified fracture of shaft of right femur, subsequent encounter for closed fracture with routine healing: Secondary | ICD-10-CM | POA: Diagnosis not present

## 2016-06-30 DIAGNOSIS — Z9181 History of falling: Secondary | ICD-10-CM | POA: Diagnosis not present

## 2016-06-30 DIAGNOSIS — M810 Age-related osteoporosis without current pathological fracture: Secondary | ICD-10-CM | POA: Diagnosis not present

## 2016-06-30 DIAGNOSIS — Z9889 Other specified postprocedural states: Secondary | ICD-10-CM | POA: Diagnosis not present

## 2016-06-30 DIAGNOSIS — S72401D Unspecified fracture of lower end of right femur, subsequent encounter for closed fracture with routine healing: Secondary | ICD-10-CM | POA: Diagnosis not present

## 2016-06-30 DIAGNOSIS — Z792 Long term (current) use of antibiotics: Secondary | ICD-10-CM | POA: Diagnosis not present

## 2016-06-30 DIAGNOSIS — Z8679 Personal history of other diseases of the circulatory system: Secondary | ICD-10-CM | POA: Diagnosis not present

## 2016-06-30 LAB — POCT INR: INR: 1.2

## 2016-07-02 DIAGNOSIS — T8451XD Infection and inflammatory reaction due to internal right hip prosthesis, subsequent encounter: Secondary | ICD-10-CM | POA: Diagnosis not present

## 2016-07-02 DIAGNOSIS — Z452 Encounter for adjustment and management of vascular access device: Secondary | ICD-10-CM | POA: Diagnosis not present

## 2016-07-02 DIAGNOSIS — M869 Osteomyelitis, unspecified: Secondary | ICD-10-CM | POA: Diagnosis not present

## 2016-07-02 DIAGNOSIS — Z5181 Encounter for therapeutic drug level monitoring: Secondary | ICD-10-CM | POA: Diagnosis not present

## 2016-07-02 DIAGNOSIS — I110 Hypertensive heart disease with heart failure: Secondary | ICD-10-CM | POA: Diagnosis not present

## 2016-07-02 DIAGNOSIS — R2689 Other abnormalities of gait and mobility: Secondary | ICD-10-CM | POA: Diagnosis not present

## 2016-07-02 DIAGNOSIS — Z9181 History of falling: Secondary | ICD-10-CM | POA: Diagnosis not present

## 2016-07-02 DIAGNOSIS — Z96651 Presence of right artificial knee joint: Secondary | ICD-10-CM | POA: Diagnosis not present

## 2016-07-02 DIAGNOSIS — S72351D Displaced comminuted fracture of shaft of right femur, subsequent encounter for closed fracture with routine healing: Secondary | ICD-10-CM | POA: Diagnosis not present

## 2016-07-02 DIAGNOSIS — Z7901 Long term (current) use of anticoagulants: Secondary | ICD-10-CM | POA: Diagnosis not present

## 2016-07-02 DIAGNOSIS — E119 Type 2 diabetes mellitus without complications: Secondary | ICD-10-CM | POA: Diagnosis not present

## 2016-07-02 DIAGNOSIS — Z792 Long term (current) use of antibiotics: Secondary | ICD-10-CM | POA: Diagnosis not present

## 2016-07-02 DIAGNOSIS — R531 Weakness: Secondary | ICD-10-CM | POA: Diagnosis not present

## 2016-07-02 DIAGNOSIS — I509 Heart failure, unspecified: Secondary | ICD-10-CM | POA: Diagnosis not present

## 2016-07-05 DIAGNOSIS — E119 Type 2 diabetes mellitus without complications: Secondary | ICD-10-CM | POA: Diagnosis not present

## 2016-07-05 DIAGNOSIS — T8451XD Infection and inflammatory reaction due to internal right hip prosthesis, subsequent encounter: Secondary | ICD-10-CM | POA: Diagnosis not present

## 2016-07-05 DIAGNOSIS — I509 Heart failure, unspecified: Secondary | ICD-10-CM | POA: Diagnosis not present

## 2016-07-05 DIAGNOSIS — Z452 Encounter for adjustment and management of vascular access device: Secondary | ICD-10-CM | POA: Diagnosis not present

## 2016-07-05 DIAGNOSIS — R531 Weakness: Secondary | ICD-10-CM | POA: Diagnosis not present

## 2016-07-05 DIAGNOSIS — M869 Osteomyelitis, unspecified: Secondary | ICD-10-CM | POA: Diagnosis not present

## 2016-07-05 DIAGNOSIS — I110 Hypertensive heart disease with heart failure: Secondary | ICD-10-CM | POA: Diagnosis not present

## 2016-07-05 DIAGNOSIS — Z96651 Presence of right artificial knee joint: Secondary | ICD-10-CM | POA: Diagnosis not present

## 2016-07-05 DIAGNOSIS — Z9181 History of falling: Secondary | ICD-10-CM | POA: Diagnosis not present

## 2016-07-05 DIAGNOSIS — Z7901 Long term (current) use of anticoagulants: Secondary | ICD-10-CM | POA: Diagnosis not present

## 2016-07-05 DIAGNOSIS — Z792 Long term (current) use of antibiotics: Secondary | ICD-10-CM | POA: Diagnosis not present

## 2016-07-05 DIAGNOSIS — R2689 Other abnormalities of gait and mobility: Secondary | ICD-10-CM | POA: Diagnosis not present

## 2016-07-05 DIAGNOSIS — Z5181 Encounter for therapeutic drug level monitoring: Secondary | ICD-10-CM | POA: Diagnosis not present

## 2016-07-06 DIAGNOSIS — T814XXA Infection following a procedure, initial encounter: Secondary | ICD-10-CM | POA: Diagnosis not present

## 2016-07-07 ENCOUNTER — Telehealth: Payer: Self-pay | Admitting: Family Medicine

## 2016-07-07 ENCOUNTER — Ambulatory Visit (INDEPENDENT_AMBULATORY_CARE_PROVIDER_SITE_OTHER): Payer: Medicare Other | Admitting: Pharmacist

## 2016-07-07 DIAGNOSIS — E119 Type 2 diabetes mellitus without complications: Secondary | ICD-10-CM | POA: Diagnosis not present

## 2016-07-07 DIAGNOSIS — R2689 Other abnormalities of gait and mobility: Secondary | ICD-10-CM | POA: Diagnosis not present

## 2016-07-07 DIAGNOSIS — T8451XD Infection and inflammatory reaction due to internal right hip prosthesis, subsequent encounter: Secondary | ICD-10-CM | POA: Diagnosis not present

## 2016-07-07 DIAGNOSIS — M869 Osteomyelitis, unspecified: Secondary | ICD-10-CM | POA: Diagnosis not present

## 2016-07-07 DIAGNOSIS — Z792 Long term (current) use of antibiotics: Secondary | ICD-10-CM | POA: Diagnosis not present

## 2016-07-07 DIAGNOSIS — Z9181 History of falling: Secondary | ICD-10-CM | POA: Diagnosis not present

## 2016-07-07 DIAGNOSIS — Z7901 Long term (current) use of anticoagulants: Secondary | ICD-10-CM

## 2016-07-07 DIAGNOSIS — I110 Hypertensive heart disease with heart failure: Secondary | ICD-10-CM | POA: Diagnosis not present

## 2016-07-07 DIAGNOSIS — R531 Weakness: Secondary | ICD-10-CM | POA: Diagnosis not present

## 2016-07-07 DIAGNOSIS — Z5181 Encounter for therapeutic drug level monitoring: Secondary | ICD-10-CM | POA: Diagnosis not present

## 2016-07-07 DIAGNOSIS — Z452 Encounter for adjustment and management of vascular access device: Secondary | ICD-10-CM | POA: Diagnosis not present

## 2016-07-07 DIAGNOSIS — I509 Heart failure, unspecified: Secondary | ICD-10-CM | POA: Diagnosis not present

## 2016-07-07 DIAGNOSIS — Z96651 Presence of right artificial knee joint: Secondary | ICD-10-CM | POA: Diagnosis not present

## 2016-07-07 LAB — POCT INR: INR: 1.7

## 2016-07-07 NOTE — Telephone Encounter (Signed)
Called pt's daughter and lvm w/results of her mothers labs.[name] [place]

## 2016-07-07 NOTE — Telephone Encounter (Signed)
Call pt: labs from 06/28/16 are normal

## 2016-07-09 DIAGNOSIS — I509 Heart failure, unspecified: Secondary | ICD-10-CM | POA: Diagnosis not present

## 2016-07-09 DIAGNOSIS — T8451XD Infection and inflammatory reaction due to internal right hip prosthesis, subsequent encounter: Secondary | ICD-10-CM | POA: Diagnosis not present

## 2016-07-09 DIAGNOSIS — M869 Osteomyelitis, unspecified: Secondary | ICD-10-CM | POA: Diagnosis not present

## 2016-07-09 DIAGNOSIS — Z792 Long term (current) use of antibiotics: Secondary | ICD-10-CM | POA: Diagnosis not present

## 2016-07-09 DIAGNOSIS — Z9181 History of falling: Secondary | ICD-10-CM | POA: Diagnosis not present

## 2016-07-09 DIAGNOSIS — R531 Weakness: Secondary | ICD-10-CM | POA: Diagnosis not present

## 2016-07-09 DIAGNOSIS — Z7901 Long term (current) use of anticoagulants: Secondary | ICD-10-CM | POA: Diagnosis not present

## 2016-07-09 DIAGNOSIS — I110 Hypertensive heart disease with heart failure: Secondary | ICD-10-CM | POA: Diagnosis not present

## 2016-07-09 DIAGNOSIS — Z96651 Presence of right artificial knee joint: Secondary | ICD-10-CM | POA: Diagnosis not present

## 2016-07-09 DIAGNOSIS — E119 Type 2 diabetes mellitus without complications: Secondary | ICD-10-CM | POA: Diagnosis not present

## 2016-07-09 DIAGNOSIS — Z452 Encounter for adjustment and management of vascular access device: Secondary | ICD-10-CM | POA: Diagnosis not present

## 2016-07-09 DIAGNOSIS — R2689 Other abnormalities of gait and mobility: Secondary | ICD-10-CM | POA: Diagnosis not present

## 2016-07-09 DIAGNOSIS — Z5181 Encounter for therapeutic drug level monitoring: Secondary | ICD-10-CM | POA: Diagnosis not present

## 2016-07-12 ENCOUNTER — Encounter: Payer: Self-pay | Admitting: Family Medicine

## 2016-07-12 ENCOUNTER — Telehealth: Payer: Self-pay

## 2016-07-12 ENCOUNTER — Other Ambulatory Visit: Payer: Self-pay | Admitting: Family Medicine

## 2016-07-12 DIAGNOSIS — Z792 Long term (current) use of antibiotics: Secondary | ICD-10-CM | POA: Diagnosis not present

## 2016-07-12 DIAGNOSIS — I110 Hypertensive heart disease with heart failure: Secondary | ICD-10-CM | POA: Diagnosis not present

## 2016-07-12 DIAGNOSIS — R531 Weakness: Secondary | ICD-10-CM | POA: Diagnosis not present

## 2016-07-12 DIAGNOSIS — Z96651 Presence of right artificial knee joint: Secondary | ICD-10-CM | POA: Diagnosis not present

## 2016-07-12 DIAGNOSIS — Z5181 Encounter for therapeutic drug level monitoring: Secondary | ICD-10-CM | POA: Diagnosis not present

## 2016-07-12 DIAGNOSIS — M869 Osteomyelitis, unspecified: Secondary | ICD-10-CM | POA: Diagnosis not present

## 2016-07-12 DIAGNOSIS — I509 Heart failure, unspecified: Secondary | ICD-10-CM | POA: Diagnosis not present

## 2016-07-12 DIAGNOSIS — Z7901 Long term (current) use of anticoagulants: Secondary | ICD-10-CM | POA: Diagnosis not present

## 2016-07-12 DIAGNOSIS — T8451XD Infection and inflammatory reaction due to internal right hip prosthesis, subsequent encounter: Secondary | ICD-10-CM | POA: Diagnosis not present

## 2016-07-12 DIAGNOSIS — E119 Type 2 diabetes mellitus without complications: Secondary | ICD-10-CM | POA: Diagnosis not present

## 2016-07-12 DIAGNOSIS — Z9181 History of falling: Secondary | ICD-10-CM | POA: Diagnosis not present

## 2016-07-12 DIAGNOSIS — Z452 Encounter for adjustment and management of vascular access device: Secondary | ICD-10-CM | POA: Diagnosis not present

## 2016-07-12 DIAGNOSIS — R2689 Other abnormalities of gait and mobility: Secondary | ICD-10-CM | POA: Diagnosis not present

## 2016-07-12 LAB — BASIC METABOLIC PANEL
BUN: 12 mg/dL (ref 4–21)
Creatinine: 1.1 mg/dL (ref 0.5–1.1)

## 2016-07-12 LAB — CBC AND DIFFERENTIAL
HEMATOCRIT: 36 % (ref 36–46)
HEMOGLOBIN: 11.2 g/dL — AB (ref 12.0–16.0)
PLATELETS: 291 10*3/uL (ref 150–399)
WBC: 7.1 10*3/mL

## 2016-07-12 MED ORDER — FLUCONAZOLE 150 MG PO TABS: 150.0000 mg | ORAL_TABLET | Freq: Once | ORAL | Status: DC

## 2016-07-12 NOTE — Telephone Encounter (Signed)
[name] from [facility] called. She states [name] has a yeast infection because she has been on IV ampicillin for a month. She has redness, denies discharge or pelvic pain. They have tried Nystatin cream without relief. She requested Diflucan.

## 2016-07-12 NOTE — Telephone Encounter (Signed)
rx sent for diflucan

## 2016-07-12 NOTE — Telephone Encounter (Signed)
[name] advised.  

## 2016-07-13 ENCOUNTER — Other Ambulatory Visit: Payer: Self-pay | Admitting: Family Medicine

## 2016-07-14 ENCOUNTER — Ambulatory Visit (INDEPENDENT_AMBULATORY_CARE_PROVIDER_SITE_OTHER): Payer: Medicare Other | Admitting: Pharmacist

## 2016-07-14 DIAGNOSIS — M869 Osteomyelitis, unspecified: Secondary | ICD-10-CM | POA: Diagnosis not present

## 2016-07-14 DIAGNOSIS — T8451XD Infection and inflammatory reaction due to internal right hip prosthesis, subsequent encounter: Secondary | ICD-10-CM | POA: Diagnosis not present

## 2016-07-14 DIAGNOSIS — Z96651 Presence of right artificial knee joint: Secondary | ICD-10-CM | POA: Diagnosis not present

## 2016-07-14 DIAGNOSIS — Z7901 Long term (current) use of anticoagulants: Secondary | ICD-10-CM | POA: Diagnosis not present

## 2016-07-14 DIAGNOSIS — I110 Hypertensive heart disease with heart failure: Secondary | ICD-10-CM | POA: Diagnosis not present

## 2016-07-14 DIAGNOSIS — R531 Weakness: Secondary | ICD-10-CM | POA: Diagnosis not present

## 2016-07-14 DIAGNOSIS — E119 Type 2 diabetes mellitus without complications: Secondary | ICD-10-CM | POA: Diagnosis not present

## 2016-07-14 DIAGNOSIS — I509 Heart failure, unspecified: Secondary | ICD-10-CM | POA: Diagnosis not present

## 2016-07-14 DIAGNOSIS — Z792 Long term (current) use of antibiotics: Secondary | ICD-10-CM | POA: Diagnosis not present

## 2016-07-14 DIAGNOSIS — R2689 Other abnormalities of gait and mobility: Secondary | ICD-10-CM | POA: Diagnosis not present

## 2016-07-14 DIAGNOSIS — S72351D Displaced comminuted fracture of shaft of right femur, subsequent encounter for closed fracture with routine healing: Secondary | ICD-10-CM | POA: Diagnosis not present

## 2016-07-14 DIAGNOSIS — Z9181 History of falling: Secondary | ICD-10-CM | POA: Diagnosis not present

## 2016-07-14 DIAGNOSIS — Z5181 Encounter for therapeutic drug level monitoring: Secondary | ICD-10-CM | POA: Diagnosis not present

## 2016-07-14 DIAGNOSIS — Z452 Encounter for adjustment and management of vascular access device: Secondary | ICD-10-CM | POA: Diagnosis not present

## 2016-07-14 LAB — POCT INR: INR: 3.9

## 2016-07-14 MED ORDER — NYSTATIN 100000 UNIT/ML MT SUSP: 5.0000 mL | Freq: Four times a day (QID) | OROMUCOSAL | Status: DC

## 2016-07-14 MED ORDER — FLUCONAZOLE 150 MG PO TABS: 150.0000 mg | ORAL_TABLET | Freq: Once | ORAL | Status: DC

## 2016-07-14 NOTE — Addendum Note (Signed)
Addended by: Teddy Spike on: 07/14/2016 05:40 PM   Modules accepted: Orders

## 2016-07-14 NOTE — Telephone Encounter (Addendum)
[name] from [facility] called and lvm stating that pt's redness has cleared up some and would like for Dr. Madilyn Fireman to send a refill for diflucan and for mouthwash for her . Diflucan and nystatin mouthwash sent to pharmacy.[name]

## 2016-07-16 DIAGNOSIS — R531 Weakness: Secondary | ICD-10-CM | POA: Diagnosis not present

## 2016-07-16 DIAGNOSIS — M869 Osteomyelitis, unspecified: Secondary | ICD-10-CM | POA: Diagnosis not present

## 2016-07-16 DIAGNOSIS — Z792 Long term (current) use of antibiotics: Secondary | ICD-10-CM | POA: Diagnosis not present

## 2016-07-16 DIAGNOSIS — I110 Hypertensive heart disease with heart failure: Secondary | ICD-10-CM | POA: Diagnosis not present

## 2016-07-16 DIAGNOSIS — Z7901 Long term (current) use of anticoagulants: Secondary | ICD-10-CM | POA: Diagnosis not present

## 2016-07-16 DIAGNOSIS — Z96651 Presence of right artificial knee joint: Secondary | ICD-10-CM | POA: Diagnosis not present

## 2016-07-16 DIAGNOSIS — Z452 Encounter for adjustment and management of vascular access device: Secondary | ICD-10-CM | POA: Diagnosis not present

## 2016-07-16 DIAGNOSIS — R2689 Other abnormalities of gait and mobility: Secondary | ICD-10-CM | POA: Diagnosis not present

## 2016-07-16 DIAGNOSIS — E119 Type 2 diabetes mellitus without complications: Secondary | ICD-10-CM | POA: Diagnosis not present

## 2016-07-16 DIAGNOSIS — I509 Heart failure, unspecified: Secondary | ICD-10-CM | POA: Diagnosis not present

## 2016-07-16 DIAGNOSIS — Z9181 History of falling: Secondary | ICD-10-CM | POA: Diagnosis not present

## 2016-07-16 DIAGNOSIS — Z5181 Encounter for therapeutic drug level monitoring: Secondary | ICD-10-CM | POA: Diagnosis not present

## 2016-07-16 DIAGNOSIS — T8451XD Infection and inflammatory reaction due to internal right hip prosthesis, subsequent encounter: Secondary | ICD-10-CM | POA: Diagnosis not present

## 2016-07-17 DIAGNOSIS — I110 Hypertensive heart disease with heart failure: Secondary | ICD-10-CM | POA: Diagnosis not present

## 2016-07-17 DIAGNOSIS — R531 Weakness: Secondary | ICD-10-CM | POA: Diagnosis not present

## 2016-07-17 DIAGNOSIS — I509 Heart failure, unspecified: Secondary | ICD-10-CM | POA: Diagnosis not present

## 2016-07-17 DIAGNOSIS — Z9181 History of falling: Secondary | ICD-10-CM | POA: Diagnosis not present

## 2016-07-17 DIAGNOSIS — Z452 Encounter for adjustment and management of vascular access device: Secondary | ICD-10-CM | POA: Diagnosis not present

## 2016-07-17 DIAGNOSIS — M869 Osteomyelitis, unspecified: Secondary | ICD-10-CM | POA: Diagnosis not present

## 2016-07-17 DIAGNOSIS — Z96651 Presence of right artificial knee joint: Secondary | ICD-10-CM | POA: Diagnosis not present

## 2016-07-17 DIAGNOSIS — Z792 Long term (current) use of antibiotics: Secondary | ICD-10-CM | POA: Diagnosis not present

## 2016-07-17 DIAGNOSIS — E119 Type 2 diabetes mellitus without complications: Secondary | ICD-10-CM | POA: Diagnosis not present

## 2016-07-17 DIAGNOSIS — Z7901 Long term (current) use of anticoagulants: Secondary | ICD-10-CM | POA: Diagnosis not present

## 2016-07-17 DIAGNOSIS — T8451XD Infection and inflammatory reaction due to internal right hip prosthesis, subsequent encounter: Secondary | ICD-10-CM | POA: Diagnosis not present

## 2016-07-17 DIAGNOSIS — Z5181 Encounter for therapeutic drug level monitoring: Secondary | ICD-10-CM | POA: Diagnosis not present

## 2016-07-17 DIAGNOSIS — R2689 Other abnormalities of gait and mobility: Secondary | ICD-10-CM | POA: Diagnosis not present

## 2016-07-18 DIAGNOSIS — Z792 Long term (current) use of antibiotics: Secondary | ICD-10-CM | POA: Diagnosis not present

## 2016-07-18 DIAGNOSIS — I509 Heart failure, unspecified: Secondary | ICD-10-CM | POA: Diagnosis not present

## 2016-07-18 DIAGNOSIS — Z452 Encounter for adjustment and management of vascular access device: Secondary | ICD-10-CM | POA: Diagnosis not present

## 2016-07-18 DIAGNOSIS — I110 Hypertensive heart disease with heart failure: Secondary | ICD-10-CM | POA: Diagnosis not present

## 2016-07-18 DIAGNOSIS — Z7901 Long term (current) use of anticoagulants: Secondary | ICD-10-CM | POA: Diagnosis not present

## 2016-07-18 DIAGNOSIS — R531 Weakness: Secondary | ICD-10-CM | POA: Diagnosis not present

## 2016-07-18 DIAGNOSIS — Z9181 History of falling: Secondary | ICD-10-CM | POA: Diagnosis not present

## 2016-07-18 DIAGNOSIS — R2689 Other abnormalities of gait and mobility: Secondary | ICD-10-CM | POA: Diagnosis not present

## 2016-07-18 DIAGNOSIS — M869 Osteomyelitis, unspecified: Secondary | ICD-10-CM | POA: Diagnosis not present

## 2016-07-18 DIAGNOSIS — Z5181 Encounter for therapeutic drug level monitoring: Secondary | ICD-10-CM | POA: Diagnosis not present

## 2016-07-18 DIAGNOSIS — Z96651 Presence of right artificial knee joint: Secondary | ICD-10-CM | POA: Diagnosis not present

## 2016-07-18 DIAGNOSIS — T8451XD Infection and inflammatory reaction due to internal right hip prosthesis, subsequent encounter: Secondary | ICD-10-CM | POA: Diagnosis not present

## 2016-07-18 DIAGNOSIS — E119 Type 2 diabetes mellitus without complications: Secondary | ICD-10-CM | POA: Diagnosis not present

## 2016-07-19 DIAGNOSIS — Z9181 History of falling: Secondary | ICD-10-CM | POA: Diagnosis not present

## 2016-07-19 DIAGNOSIS — T8451XD Infection and inflammatory reaction due to internal right hip prosthesis, subsequent encounter: Secondary | ICD-10-CM | POA: Diagnosis not present

## 2016-07-19 DIAGNOSIS — R2689 Other abnormalities of gait and mobility: Secondary | ICD-10-CM | POA: Diagnosis not present

## 2016-07-19 DIAGNOSIS — I110 Hypertensive heart disease with heart failure: Secondary | ICD-10-CM | POA: Diagnosis not present

## 2016-07-19 DIAGNOSIS — Z792 Long term (current) use of antibiotics: Secondary | ICD-10-CM | POA: Diagnosis not present

## 2016-07-19 DIAGNOSIS — Z96651 Presence of right artificial knee joint: Secondary | ICD-10-CM | POA: Diagnosis not present

## 2016-07-19 DIAGNOSIS — E119 Type 2 diabetes mellitus without complications: Secondary | ICD-10-CM | POA: Diagnosis not present

## 2016-07-19 DIAGNOSIS — M869 Osteomyelitis, unspecified: Secondary | ICD-10-CM | POA: Diagnosis not present

## 2016-07-19 DIAGNOSIS — Z452 Encounter for adjustment and management of vascular access device: Secondary | ICD-10-CM | POA: Diagnosis not present

## 2016-07-19 DIAGNOSIS — I509 Heart failure, unspecified: Secondary | ICD-10-CM | POA: Diagnosis not present

## 2016-07-19 DIAGNOSIS — Z5181 Encounter for therapeutic drug level monitoring: Secondary | ICD-10-CM | POA: Diagnosis not present

## 2016-07-19 DIAGNOSIS — R531 Weakness: Secondary | ICD-10-CM | POA: Diagnosis not present

## 2016-07-19 DIAGNOSIS — Z7901 Long term (current) use of anticoagulants: Secondary | ICD-10-CM | POA: Diagnosis not present

## 2016-07-20 DIAGNOSIS — Z452 Encounter for adjustment and management of vascular access device: Secondary | ICD-10-CM | POA: Diagnosis not present

## 2016-07-20 DIAGNOSIS — Z9181 History of falling: Secondary | ICD-10-CM | POA: Diagnosis not present

## 2016-07-20 DIAGNOSIS — T8451XD Infection and inflammatory reaction due to internal right hip prosthesis, subsequent encounter: Secondary | ICD-10-CM | POA: Diagnosis not present

## 2016-07-20 DIAGNOSIS — I110 Hypertensive heart disease with heart failure: Secondary | ICD-10-CM | POA: Diagnosis not present

## 2016-07-20 DIAGNOSIS — Z792 Long term (current) use of antibiotics: Secondary | ICD-10-CM | POA: Diagnosis not present

## 2016-07-20 DIAGNOSIS — E119 Type 2 diabetes mellitus without complications: Secondary | ICD-10-CM | POA: Diagnosis not present

## 2016-07-20 DIAGNOSIS — R2689 Other abnormalities of gait and mobility: Secondary | ICD-10-CM | POA: Diagnosis not present

## 2016-07-20 DIAGNOSIS — Z96651 Presence of right artificial knee joint: Secondary | ICD-10-CM | POA: Diagnosis not present

## 2016-07-20 DIAGNOSIS — R531 Weakness: Secondary | ICD-10-CM | POA: Diagnosis not present

## 2016-07-20 DIAGNOSIS — M869 Osteomyelitis, unspecified: Secondary | ICD-10-CM | POA: Diagnosis not present

## 2016-07-20 DIAGNOSIS — I509 Heart failure, unspecified: Secondary | ICD-10-CM | POA: Diagnosis not present

## 2016-07-20 DIAGNOSIS — Z7901 Long term (current) use of anticoagulants: Secondary | ICD-10-CM | POA: Diagnosis not present

## 2016-07-20 DIAGNOSIS — Z5181 Encounter for therapeutic drug level monitoring: Secondary | ICD-10-CM | POA: Diagnosis not present

## 2016-07-21 ENCOUNTER — Ambulatory Visit (INDEPENDENT_AMBULATORY_CARE_PROVIDER_SITE_OTHER): Payer: Medicare Other | Admitting: Pharmacist Clinician (PhC)/ Clinical Pharmacy Specialist

## 2016-07-21 DIAGNOSIS — Z792 Long term (current) use of antibiotics: Secondary | ICD-10-CM | POA: Diagnosis not present

## 2016-07-21 DIAGNOSIS — Z9181 History of falling: Secondary | ICD-10-CM | POA: Diagnosis not present

## 2016-07-21 DIAGNOSIS — T8451XD Infection and inflammatory reaction due to internal right hip prosthesis, subsequent encounter: Secondary | ICD-10-CM | POA: Diagnosis not present

## 2016-07-21 DIAGNOSIS — I110 Hypertensive heart disease with heart failure: Secondary | ICD-10-CM | POA: Diagnosis not present

## 2016-07-21 DIAGNOSIS — Z7901 Long term (current) use of anticoagulants: Secondary | ICD-10-CM | POA: Diagnosis not present

## 2016-07-21 DIAGNOSIS — Z96651 Presence of right artificial knee joint: Secondary | ICD-10-CM | POA: Diagnosis not present

## 2016-07-21 DIAGNOSIS — I509 Heart failure, unspecified: Secondary | ICD-10-CM | POA: Diagnosis not present

## 2016-07-21 DIAGNOSIS — Z5181 Encounter for therapeutic drug level monitoring: Secondary | ICD-10-CM | POA: Diagnosis not present

## 2016-07-21 DIAGNOSIS — M869 Osteomyelitis, unspecified: Secondary | ICD-10-CM | POA: Diagnosis not present

## 2016-07-21 DIAGNOSIS — R2689 Other abnormalities of gait and mobility: Secondary | ICD-10-CM | POA: Diagnosis not present

## 2016-07-21 DIAGNOSIS — R531 Weakness: Secondary | ICD-10-CM | POA: Diagnosis not present

## 2016-07-21 DIAGNOSIS — E119 Type 2 diabetes mellitus without complications: Secondary | ICD-10-CM | POA: Diagnosis not present

## 2016-07-21 DIAGNOSIS — Z452 Encounter for adjustment and management of vascular access device: Secondary | ICD-10-CM | POA: Diagnosis not present

## 2016-07-21 LAB — PROTIME-INR: INR: 5.2 — AB (ref <=1.1)

## 2016-07-23 DIAGNOSIS — R2689 Other abnormalities of gait and mobility: Secondary | ICD-10-CM | POA: Diagnosis not present

## 2016-07-23 DIAGNOSIS — Z5181 Encounter for therapeutic drug level monitoring: Secondary | ICD-10-CM | POA: Diagnosis not present

## 2016-07-23 DIAGNOSIS — I509 Heart failure, unspecified: Secondary | ICD-10-CM | POA: Diagnosis not present

## 2016-07-23 DIAGNOSIS — M869 Osteomyelitis, unspecified: Secondary | ICD-10-CM | POA: Diagnosis not present

## 2016-07-23 DIAGNOSIS — T8451XD Infection and inflammatory reaction due to internal right hip prosthesis, subsequent encounter: Secondary | ICD-10-CM | POA: Diagnosis not present

## 2016-07-23 DIAGNOSIS — Z792 Long term (current) use of antibiotics: Secondary | ICD-10-CM | POA: Diagnosis not present

## 2016-07-23 DIAGNOSIS — I110 Hypertensive heart disease with heart failure: Secondary | ICD-10-CM | POA: Diagnosis not present

## 2016-07-23 DIAGNOSIS — Z96651 Presence of right artificial knee joint: Secondary | ICD-10-CM | POA: Diagnosis not present

## 2016-07-23 DIAGNOSIS — E119 Type 2 diabetes mellitus without complications: Secondary | ICD-10-CM | POA: Diagnosis not present

## 2016-07-23 DIAGNOSIS — Z452 Encounter for adjustment and management of vascular access device: Secondary | ICD-10-CM | POA: Diagnosis not present

## 2016-07-23 DIAGNOSIS — Z7901 Long term (current) use of anticoagulants: Secondary | ICD-10-CM | POA: Diagnosis not present

## 2016-07-23 DIAGNOSIS — Z9181 History of falling: Secondary | ICD-10-CM | POA: Diagnosis not present

## 2016-07-23 DIAGNOSIS — R531 Weakness: Secondary | ICD-10-CM | POA: Diagnosis not present

## 2016-07-26 DIAGNOSIS — Z452 Encounter for adjustment and management of vascular access device: Secondary | ICD-10-CM | POA: Diagnosis not present

## 2016-07-26 DIAGNOSIS — I509 Heart failure, unspecified: Secondary | ICD-10-CM | POA: Diagnosis not present

## 2016-07-26 DIAGNOSIS — Z96651 Presence of right artificial knee joint: Secondary | ICD-10-CM | POA: Diagnosis not present

## 2016-07-26 DIAGNOSIS — Z792 Long term (current) use of antibiotics: Secondary | ICD-10-CM | POA: Diagnosis not present

## 2016-07-26 DIAGNOSIS — Z5181 Encounter for therapeutic drug level monitoring: Secondary | ICD-10-CM | POA: Diagnosis not present

## 2016-07-26 DIAGNOSIS — T8451XD Infection and inflammatory reaction due to internal right hip prosthesis, subsequent encounter: Secondary | ICD-10-CM | POA: Diagnosis not present

## 2016-07-26 DIAGNOSIS — I110 Hypertensive heart disease with heart failure: Secondary | ICD-10-CM | POA: Diagnosis not present

## 2016-07-26 DIAGNOSIS — Z7901 Long term (current) use of anticoagulants: Secondary | ICD-10-CM | POA: Diagnosis not present

## 2016-07-26 DIAGNOSIS — E119 Type 2 diabetes mellitus without complications: Secondary | ICD-10-CM | POA: Diagnosis not present

## 2016-07-26 DIAGNOSIS — R2689 Other abnormalities of gait and mobility: Secondary | ICD-10-CM | POA: Diagnosis not present

## 2016-07-26 DIAGNOSIS — Z9181 History of falling: Secondary | ICD-10-CM | POA: Diagnosis not present

## 2016-07-26 DIAGNOSIS — M869 Osteomyelitis, unspecified: Secondary | ICD-10-CM | POA: Diagnosis not present

## 2016-07-26 DIAGNOSIS — R531 Weakness: Secondary | ICD-10-CM | POA: Diagnosis not present

## 2016-07-28 ENCOUNTER — Other Ambulatory Visit: Payer: Self-pay | Admitting: Family Medicine

## 2016-07-28 ENCOUNTER — Ambulatory Visit (INDEPENDENT_AMBULATORY_CARE_PROVIDER_SITE_OTHER): Payer: Medicare Other | Admitting: Pharmacist Clinician (PhC)/ Clinical Pharmacy Specialist

## 2016-07-28 ENCOUNTER — Telehealth: Payer: Self-pay | Admitting: *Deleted

## 2016-07-28 DIAGNOSIS — Z9181 History of falling: Secondary | ICD-10-CM | POA: Diagnosis not present

## 2016-07-28 DIAGNOSIS — Z7901 Long term (current) use of anticoagulants: Secondary | ICD-10-CM

## 2016-07-28 DIAGNOSIS — I509 Heart failure, unspecified: Secondary | ICD-10-CM | POA: Diagnosis not present

## 2016-07-28 DIAGNOSIS — R531 Weakness: Secondary | ICD-10-CM | POA: Diagnosis not present

## 2016-07-28 DIAGNOSIS — T8451XD Infection and inflammatory reaction due to internal right hip prosthesis, subsequent encounter: Secondary | ICD-10-CM | POA: Diagnosis not present

## 2016-07-28 DIAGNOSIS — Z792 Long term (current) use of antibiotics: Secondary | ICD-10-CM | POA: Diagnosis not present

## 2016-07-28 DIAGNOSIS — Z452 Encounter for adjustment and management of vascular access device: Secondary | ICD-10-CM | POA: Diagnosis not present

## 2016-07-28 DIAGNOSIS — R2689 Other abnormalities of gait and mobility: Secondary | ICD-10-CM | POA: Diagnosis not present

## 2016-07-28 DIAGNOSIS — E119 Type 2 diabetes mellitus without complications: Secondary | ICD-10-CM | POA: Diagnosis not present

## 2016-07-28 DIAGNOSIS — Z5181 Encounter for therapeutic drug level monitoring: Secondary | ICD-10-CM | POA: Diagnosis not present

## 2016-07-28 DIAGNOSIS — M869 Osteomyelitis, unspecified: Secondary | ICD-10-CM | POA: Diagnosis not present

## 2016-07-28 DIAGNOSIS — Z96651 Presence of right artificial knee joint: Secondary | ICD-10-CM | POA: Diagnosis not present

## 2016-07-28 DIAGNOSIS — I110 Hypertensive heart disease with heart failure: Secondary | ICD-10-CM | POA: Diagnosis not present

## 2016-07-28 LAB — POCT INR: INR: 1.6

## 2016-07-28 NOTE — Telephone Encounter (Signed)
[name] stated that she called last week to report that pt's BS had been running in the 40-60's in the mornings. She stated that she had not heard anything.  She stated that she thought that it may have been because of the antibiotic. This morning her BS was 101. She wanted to know if there was anything that the family should be doing differently. I asked if she was having any other issues she reported that she did have some diarrhea but believes that was from when she was on the antibiotic otherwise she is fine. Will fwd to  [facility] for advice.[name] [place]

## 2016-07-28 NOTE — Telephone Encounter (Signed)
Chart states that patient is taking lantus 28 units twice a day. I suggest backing off night lantus by 2 units every 3 days until average BS are ranging between 100-130.

## 2016-07-28 NOTE — Telephone Encounter (Signed)
Called and informed [name] of recommendations.[name] [place]

## 2016-07-30 DIAGNOSIS — Z96651 Presence of right artificial knee joint: Secondary | ICD-10-CM | POA: Diagnosis not present

## 2016-07-30 DIAGNOSIS — E119 Type 2 diabetes mellitus without complications: Secondary | ICD-10-CM | POA: Diagnosis not present

## 2016-07-30 DIAGNOSIS — Z9181 History of falling: Secondary | ICD-10-CM | POA: Diagnosis not present

## 2016-07-30 DIAGNOSIS — Z792 Long term (current) use of antibiotics: Secondary | ICD-10-CM | POA: Diagnosis not present

## 2016-07-30 DIAGNOSIS — M869 Osteomyelitis, unspecified: Secondary | ICD-10-CM | POA: Diagnosis not present

## 2016-07-30 DIAGNOSIS — I509 Heart failure, unspecified: Secondary | ICD-10-CM | POA: Diagnosis not present

## 2016-07-30 DIAGNOSIS — Z452 Encounter for adjustment and management of vascular access device: Secondary | ICD-10-CM | POA: Diagnosis not present

## 2016-07-30 DIAGNOSIS — R531 Weakness: Secondary | ICD-10-CM | POA: Diagnosis not present

## 2016-07-30 DIAGNOSIS — R2689 Other abnormalities of gait and mobility: Secondary | ICD-10-CM | POA: Diagnosis not present

## 2016-07-30 DIAGNOSIS — I110 Hypertensive heart disease with heart failure: Secondary | ICD-10-CM | POA: Diagnosis not present

## 2016-07-30 DIAGNOSIS — T8451XD Infection and inflammatory reaction due to internal right hip prosthesis, subsequent encounter: Secondary | ICD-10-CM | POA: Diagnosis not present

## 2016-07-30 DIAGNOSIS — Z5181 Encounter for therapeutic drug level monitoring: Secondary | ICD-10-CM | POA: Diagnosis not present

## 2016-07-30 DIAGNOSIS — Z7901 Long term (current) use of anticoagulants: Secondary | ICD-10-CM | POA: Diagnosis not present

## 2016-08-01 ENCOUNTER — Other Ambulatory Visit: Payer: Self-pay | Admitting: Family Medicine

## 2016-08-02 ENCOUNTER — Ambulatory Visit (INDEPENDENT_AMBULATORY_CARE_PROVIDER_SITE_OTHER): Payer: Medicare Other | Admitting: Pharmacist Clinician (PhC)/ Clinical Pharmacy Specialist

## 2016-08-02 DIAGNOSIS — Z7901 Long term (current) use of anticoagulants: Secondary | ICD-10-CM | POA: Diagnosis not present

## 2016-08-02 DIAGNOSIS — R531 Weakness: Secondary | ICD-10-CM | POA: Diagnosis not present

## 2016-08-02 DIAGNOSIS — T8451XD Infection and inflammatory reaction due to internal right hip prosthesis, subsequent encounter: Secondary | ICD-10-CM | POA: Diagnosis not present

## 2016-08-02 DIAGNOSIS — R2689 Other abnormalities of gait and mobility: Secondary | ICD-10-CM | POA: Diagnosis not present

## 2016-08-02 DIAGNOSIS — I110 Hypertensive heart disease with heart failure: Secondary | ICD-10-CM | POA: Diagnosis not present

## 2016-08-02 DIAGNOSIS — E119 Type 2 diabetes mellitus without complications: Secondary | ICD-10-CM | POA: Diagnosis not present

## 2016-08-02 DIAGNOSIS — Z96651 Presence of right artificial knee joint: Secondary | ICD-10-CM | POA: Diagnosis not present

## 2016-08-02 DIAGNOSIS — Z5181 Encounter for therapeutic drug level monitoring: Secondary | ICD-10-CM | POA: Diagnosis not present

## 2016-08-02 DIAGNOSIS — Z9181 History of falling: Secondary | ICD-10-CM | POA: Diagnosis not present

## 2016-08-02 DIAGNOSIS — Z452 Encounter for adjustment and management of vascular access device: Secondary | ICD-10-CM | POA: Diagnosis not present

## 2016-08-02 DIAGNOSIS — I509 Heart failure, unspecified: Secondary | ICD-10-CM | POA: Diagnosis not present

## 2016-08-02 DIAGNOSIS — Z792 Long term (current) use of antibiotics: Secondary | ICD-10-CM | POA: Diagnosis not present

## 2016-08-02 DIAGNOSIS — M869 Osteomyelitis, unspecified: Secondary | ICD-10-CM | POA: Diagnosis not present

## 2016-08-02 LAB — POCT INR: INR: 1.6

## 2016-08-04 DIAGNOSIS — Z5181 Encounter for therapeutic drug level monitoring: Secondary | ICD-10-CM | POA: Diagnosis not present

## 2016-08-04 DIAGNOSIS — I509 Heart failure, unspecified: Secondary | ICD-10-CM | POA: Diagnosis not present

## 2016-08-04 DIAGNOSIS — E119 Type 2 diabetes mellitus without complications: Secondary | ICD-10-CM | POA: Diagnosis not present

## 2016-08-04 DIAGNOSIS — Z7901 Long term (current) use of anticoagulants: Secondary | ICD-10-CM | POA: Diagnosis not present

## 2016-08-04 DIAGNOSIS — M869 Osteomyelitis, unspecified: Secondary | ICD-10-CM | POA: Diagnosis not present

## 2016-08-04 DIAGNOSIS — Z96651 Presence of right artificial knee joint: Secondary | ICD-10-CM | POA: Diagnosis not present

## 2016-08-04 DIAGNOSIS — Z9181 History of falling: Secondary | ICD-10-CM | POA: Diagnosis not present

## 2016-08-04 DIAGNOSIS — I110 Hypertensive heart disease with heart failure: Secondary | ICD-10-CM | POA: Diagnosis not present

## 2016-08-04 DIAGNOSIS — R531 Weakness: Secondary | ICD-10-CM | POA: Diagnosis not present

## 2016-08-04 DIAGNOSIS — R2689 Other abnormalities of gait and mobility: Secondary | ICD-10-CM | POA: Diagnosis not present

## 2016-08-04 DIAGNOSIS — T8451XD Infection and inflammatory reaction due to internal right hip prosthesis, subsequent encounter: Secondary | ICD-10-CM | POA: Diagnosis not present

## 2016-08-04 DIAGNOSIS — Z452 Encounter for adjustment and management of vascular access device: Secondary | ICD-10-CM | POA: Diagnosis not present

## 2016-08-04 DIAGNOSIS — Z792 Long term (current) use of antibiotics: Secondary | ICD-10-CM | POA: Diagnosis not present

## 2016-08-06 ENCOUNTER — Telehealth: Payer: Self-pay

## 2016-08-06 DIAGNOSIS — T8451XD Infection and inflammatory reaction due to internal right hip prosthesis, subsequent encounter: Secondary | ICD-10-CM | POA: Diagnosis not present

## 2016-08-06 DIAGNOSIS — Z5181 Encounter for therapeutic drug level monitoring: Secondary | ICD-10-CM | POA: Diagnosis not present

## 2016-08-06 DIAGNOSIS — E119 Type 2 diabetes mellitus without complications: Secondary | ICD-10-CM | POA: Diagnosis not present

## 2016-08-06 DIAGNOSIS — Z96651 Presence of right artificial knee joint: Secondary | ICD-10-CM | POA: Diagnosis not present

## 2016-08-06 DIAGNOSIS — I509 Heart failure, unspecified: Secondary | ICD-10-CM | POA: Diagnosis not present

## 2016-08-06 DIAGNOSIS — M869 Osteomyelitis, unspecified: Secondary | ICD-10-CM | POA: Diagnosis not present

## 2016-08-06 DIAGNOSIS — Z7901 Long term (current) use of anticoagulants: Secondary | ICD-10-CM | POA: Diagnosis not present

## 2016-08-06 DIAGNOSIS — Z792 Long term (current) use of antibiotics: Secondary | ICD-10-CM | POA: Diagnosis not present

## 2016-08-06 DIAGNOSIS — R2689 Other abnormalities of gait and mobility: Secondary | ICD-10-CM | POA: Diagnosis not present

## 2016-08-06 DIAGNOSIS — Z452 Encounter for adjustment and management of vascular access device: Secondary | ICD-10-CM | POA: Diagnosis not present

## 2016-08-06 DIAGNOSIS — I110 Hypertensive heart disease with heart failure: Secondary | ICD-10-CM | POA: Diagnosis not present

## 2016-08-06 DIAGNOSIS — R531 Weakness: Secondary | ICD-10-CM | POA: Diagnosis not present

## 2016-08-06 DIAGNOSIS — Z9181 History of falling: Secondary | ICD-10-CM | POA: Diagnosis not present

## 2016-08-06 NOTE — Telephone Encounter (Signed)
[name] with [facility] called and reports Talyn's blood pressure was 170/90 and after recheck it was 160/70.   She also reports a problem with patient's blood sugar. She called on 07/28/2016 stating patient's had been running in the 40-60's. Jade responded; suggested backing off night lantus by 2 units every 3 days until average BS are ranging between 100-130. Now her morning readings are 80-104

## 2016-08-09 ENCOUNTER — Ambulatory Visit (INDEPENDENT_AMBULATORY_CARE_PROVIDER_SITE_OTHER): Payer: Medicare Other | Admitting: Pharmacist

## 2016-08-09 DIAGNOSIS — T8451XD Infection and inflammatory reaction due to internal right hip prosthesis, subsequent encounter: Secondary | ICD-10-CM | POA: Diagnosis not present

## 2016-08-09 DIAGNOSIS — I110 Hypertensive heart disease with heart failure: Secondary | ICD-10-CM | POA: Diagnosis not present

## 2016-08-09 DIAGNOSIS — M869 Osteomyelitis, unspecified: Secondary | ICD-10-CM | POA: Diagnosis not present

## 2016-08-09 DIAGNOSIS — R2689 Other abnormalities of gait and mobility: Secondary | ICD-10-CM | POA: Diagnosis not present

## 2016-08-09 DIAGNOSIS — I509 Heart failure, unspecified: Secondary | ICD-10-CM | POA: Diagnosis not present

## 2016-08-09 DIAGNOSIS — Z792 Long term (current) use of antibiotics: Secondary | ICD-10-CM | POA: Diagnosis not present

## 2016-08-09 DIAGNOSIS — Z7901 Long term (current) use of anticoagulants: Secondary | ICD-10-CM

## 2016-08-09 DIAGNOSIS — Z96651 Presence of right artificial knee joint: Secondary | ICD-10-CM | POA: Diagnosis not present

## 2016-08-09 DIAGNOSIS — K521 Toxic gastroenteritis and colitis: Secondary | ICD-10-CM | POA: Insufficient documentation

## 2016-08-09 DIAGNOSIS — T3695XA Adverse effect of unspecified systemic antibiotic, initial encounter: Secondary | ICD-10-CM

## 2016-08-09 DIAGNOSIS — Z9181 History of falling: Secondary | ICD-10-CM | POA: Diagnosis not present

## 2016-08-09 DIAGNOSIS — Z5181 Encounter for therapeutic drug level monitoring: Secondary | ICD-10-CM | POA: Diagnosis not present

## 2016-08-09 DIAGNOSIS — R531 Weakness: Secondary | ICD-10-CM | POA: Diagnosis not present

## 2016-08-09 DIAGNOSIS — E119 Type 2 diabetes mellitus without complications: Secondary | ICD-10-CM | POA: Diagnosis not present

## 2016-08-09 DIAGNOSIS — Z452 Encounter for adjustment and management of vascular access device: Secondary | ICD-10-CM | POA: Diagnosis not present

## 2016-08-09 DIAGNOSIS — T847XXD Infection and inflammatory reaction due to other internal orthopedic prosthetic devices, implants and grafts, subsequent encounter: Secondary | ICD-10-CM | POA: Diagnosis not present

## 2016-08-09 LAB — PROTIME-INR: INR: 2.6 — AB (ref <=1.1)

## 2016-08-10 DIAGNOSIS — E119 Type 2 diabetes mellitus without complications: Secondary | ICD-10-CM | POA: Diagnosis not present

## 2016-08-10 DIAGNOSIS — Z7901 Long term (current) use of anticoagulants: Secondary | ICD-10-CM | POA: Diagnosis not present

## 2016-08-10 DIAGNOSIS — Z96651 Presence of right artificial knee joint: Secondary | ICD-10-CM | POA: Diagnosis not present

## 2016-08-10 DIAGNOSIS — Z9181 History of falling: Secondary | ICD-10-CM | POA: Diagnosis not present

## 2016-08-10 DIAGNOSIS — I509 Heart failure, unspecified: Secondary | ICD-10-CM | POA: Diagnosis not present

## 2016-08-10 DIAGNOSIS — Z452 Encounter for adjustment and management of vascular access device: Secondary | ICD-10-CM | POA: Diagnosis not present

## 2016-08-10 DIAGNOSIS — R531 Weakness: Secondary | ICD-10-CM | POA: Diagnosis not present

## 2016-08-10 DIAGNOSIS — I110 Hypertensive heart disease with heart failure: Secondary | ICD-10-CM | POA: Diagnosis not present

## 2016-08-10 DIAGNOSIS — T8451XD Infection and inflammatory reaction due to internal right hip prosthesis, subsequent encounter: Secondary | ICD-10-CM | POA: Diagnosis not present

## 2016-08-10 DIAGNOSIS — R2689 Other abnormalities of gait and mobility: Secondary | ICD-10-CM | POA: Diagnosis not present

## 2016-08-10 DIAGNOSIS — Z792 Long term (current) use of antibiotics: Secondary | ICD-10-CM | POA: Diagnosis not present

## 2016-08-10 DIAGNOSIS — Z5181 Encounter for therapeutic drug level monitoring: Secondary | ICD-10-CM | POA: Diagnosis not present

## 2016-08-10 DIAGNOSIS — M869 Osteomyelitis, unspecified: Secondary | ICD-10-CM | POA: Diagnosis not present

## 2016-08-12 ENCOUNTER — Encounter: Payer: Self-pay | Admitting: Family Medicine

## 2016-08-12 DIAGNOSIS — Z96651 Presence of right artificial knee joint: Secondary | ICD-10-CM | POA: Diagnosis not present

## 2016-08-12 DIAGNOSIS — R531 Weakness: Secondary | ICD-10-CM | POA: Diagnosis not present

## 2016-08-12 DIAGNOSIS — E119 Type 2 diabetes mellitus without complications: Secondary | ICD-10-CM | POA: Diagnosis not present

## 2016-08-12 DIAGNOSIS — M869 Osteomyelitis, unspecified: Secondary | ICD-10-CM | POA: Diagnosis not present

## 2016-08-12 DIAGNOSIS — Z7901 Long term (current) use of anticoagulants: Secondary | ICD-10-CM | POA: Diagnosis not present

## 2016-08-12 DIAGNOSIS — I110 Hypertensive heart disease with heart failure: Secondary | ICD-10-CM | POA: Diagnosis not present

## 2016-08-12 DIAGNOSIS — Z5181 Encounter for therapeutic drug level monitoring: Secondary | ICD-10-CM | POA: Diagnosis not present

## 2016-08-12 DIAGNOSIS — I509 Heart failure, unspecified: Secondary | ICD-10-CM | POA: Diagnosis not present

## 2016-08-12 DIAGNOSIS — Z792 Long term (current) use of antibiotics: Secondary | ICD-10-CM | POA: Diagnosis not present

## 2016-08-12 DIAGNOSIS — T8451XD Infection and inflammatory reaction due to internal right hip prosthesis, subsequent encounter: Secondary | ICD-10-CM | POA: Diagnosis not present

## 2016-08-12 DIAGNOSIS — R2689 Other abnormalities of gait and mobility: Secondary | ICD-10-CM | POA: Diagnosis not present

## 2016-08-12 DIAGNOSIS — Z452 Encounter for adjustment and management of vascular access device: Secondary | ICD-10-CM | POA: Diagnosis not present

## 2016-08-12 DIAGNOSIS — Z9181 History of falling: Secondary | ICD-10-CM | POA: Diagnosis not present

## 2016-08-13 DIAGNOSIS — Z5181 Encounter for therapeutic drug level monitoring: Secondary | ICD-10-CM | POA: Diagnosis not present

## 2016-08-13 DIAGNOSIS — R531 Weakness: Secondary | ICD-10-CM | POA: Diagnosis not present

## 2016-08-13 DIAGNOSIS — M869 Osteomyelitis, unspecified: Secondary | ICD-10-CM | POA: Diagnosis not present

## 2016-08-13 DIAGNOSIS — Z9181 History of falling: Secondary | ICD-10-CM | POA: Diagnosis not present

## 2016-08-13 DIAGNOSIS — I509 Heart failure, unspecified: Secondary | ICD-10-CM | POA: Diagnosis not present

## 2016-08-13 DIAGNOSIS — I110 Hypertensive heart disease with heart failure: Secondary | ICD-10-CM | POA: Diagnosis not present

## 2016-08-13 DIAGNOSIS — Z96651 Presence of right artificial knee joint: Secondary | ICD-10-CM | POA: Diagnosis not present

## 2016-08-13 DIAGNOSIS — R2689 Other abnormalities of gait and mobility: Secondary | ICD-10-CM | POA: Diagnosis not present

## 2016-08-13 DIAGNOSIS — Z452 Encounter for adjustment and management of vascular access device: Secondary | ICD-10-CM | POA: Diagnosis not present

## 2016-08-13 DIAGNOSIS — Z7901 Long term (current) use of anticoagulants: Secondary | ICD-10-CM | POA: Diagnosis not present

## 2016-08-13 DIAGNOSIS — T8451XD Infection and inflammatory reaction due to internal right hip prosthesis, subsequent encounter: Secondary | ICD-10-CM | POA: Diagnosis not present

## 2016-08-13 DIAGNOSIS — E119 Type 2 diabetes mellitus without complications: Secondary | ICD-10-CM | POA: Diagnosis not present

## 2016-08-13 DIAGNOSIS — Z792 Long term (current) use of antibiotics: Secondary | ICD-10-CM | POA: Diagnosis not present

## 2016-08-16 DIAGNOSIS — Z5181 Encounter for therapeutic drug level monitoring: Secondary | ICD-10-CM | POA: Diagnosis not present

## 2016-08-16 DIAGNOSIS — Z950 Presence of cardiac pacemaker: Secondary | ICD-10-CM | POA: Diagnosis not present

## 2016-08-16 DIAGNOSIS — Z794 Long term (current) use of insulin: Secondary | ICD-10-CM | POA: Diagnosis not present

## 2016-08-16 DIAGNOSIS — Z96651 Presence of right artificial knee joint: Secondary | ICD-10-CM | POA: Diagnosis not present

## 2016-08-16 DIAGNOSIS — R2689 Other abnormalities of gait and mobility: Secondary | ICD-10-CM | POA: Diagnosis not present

## 2016-08-16 DIAGNOSIS — I509 Heart failure, unspecified: Secondary | ICD-10-CM | POA: Diagnosis not present

## 2016-08-16 DIAGNOSIS — Z7901 Long term (current) use of anticoagulants: Secondary | ICD-10-CM | POA: Diagnosis not present

## 2016-08-16 DIAGNOSIS — E119 Type 2 diabetes mellitus without complications: Secondary | ICD-10-CM | POA: Diagnosis not present

## 2016-08-16 DIAGNOSIS — Z9181 History of falling: Secondary | ICD-10-CM | POA: Diagnosis not present

## 2016-08-16 DIAGNOSIS — I110 Hypertensive heart disease with heart failure: Secondary | ICD-10-CM | POA: Diagnosis not present

## 2016-08-18 ENCOUNTER — Encounter: Payer: Self-pay | Admitting: Family Medicine

## 2016-08-18 ENCOUNTER — Telehealth: Payer: Self-pay

## 2016-08-18 DIAGNOSIS — I509 Heart failure, unspecified: Secondary | ICD-10-CM | POA: Diagnosis not present

## 2016-08-18 DIAGNOSIS — Z794 Long term (current) use of insulin: Secondary | ICD-10-CM | POA: Diagnosis not present

## 2016-08-18 DIAGNOSIS — Z950 Presence of cardiac pacemaker: Secondary | ICD-10-CM | POA: Diagnosis not present

## 2016-08-18 DIAGNOSIS — Z96651 Presence of right artificial knee joint: Secondary | ICD-10-CM | POA: Diagnosis not present

## 2016-08-18 DIAGNOSIS — I110 Hypertensive heart disease with heart failure: Secondary | ICD-10-CM | POA: Diagnosis not present

## 2016-08-18 DIAGNOSIS — E119 Type 2 diabetes mellitus without complications: Secondary | ICD-10-CM | POA: Diagnosis not present

## 2016-08-18 DIAGNOSIS — Z9181 History of falling: Secondary | ICD-10-CM | POA: Diagnosis not present

## 2016-08-18 DIAGNOSIS — Z5181 Encounter for therapeutic drug level monitoring: Secondary | ICD-10-CM | POA: Diagnosis not present

## 2016-08-18 DIAGNOSIS — Z7901 Long term (current) use of anticoagulants: Secondary | ICD-10-CM | POA: Diagnosis not present

## 2016-08-18 DIAGNOSIS — R2689 Other abnormalities of gait and mobility: Secondary | ICD-10-CM | POA: Diagnosis not present

## 2016-08-18 NOTE — Telephone Encounter (Signed)
Roxanne from [facility] called and reports; Lecia's fasting glucose in the am is <80 mg/dl and pm glucose is from 280 mg/dl to 426 mg/dl. She is taking Lantus 22 units bid. Please advise.       [facility] in [place], [place]  Address: [address], [place], [place] [address] Phone: [phone]

## 2016-08-19 MED ORDER — SITAGLIPTIN PHOSPHATE 100 MG PO TABS: 100.0000 mg | ORAL_TABLET | Freq: Every day | ORAL | 5 refills | Status: DC

## 2016-08-19 NOTE — Telephone Encounter (Signed)
Ok, lets stop the glipizide since tends to cause hypoglycemia and lets put her on suomthing that will help with postprandial sugars. New prescription sent to pharmacy. Continue 22 units of Lantus. Decrease nightime dose to 20 units of Lantus

## 2016-08-19 NOTE — Telephone Encounter (Signed)
Left message advising of recommendations.  

## 2016-08-20 DIAGNOSIS — Z5181 Encounter for therapeutic drug level monitoring: Secondary | ICD-10-CM | POA: Diagnosis not present

## 2016-08-20 DIAGNOSIS — Z96651 Presence of right artificial knee joint: Secondary | ICD-10-CM | POA: Diagnosis not present

## 2016-08-20 DIAGNOSIS — I110 Hypertensive heart disease with heart failure: Secondary | ICD-10-CM | POA: Diagnosis not present

## 2016-08-20 DIAGNOSIS — Z9181 History of falling: Secondary | ICD-10-CM | POA: Diagnosis not present

## 2016-08-20 DIAGNOSIS — R2689 Other abnormalities of gait and mobility: Secondary | ICD-10-CM | POA: Diagnosis not present

## 2016-08-20 DIAGNOSIS — E119 Type 2 diabetes mellitus without complications: Secondary | ICD-10-CM | POA: Diagnosis not present

## 2016-08-20 DIAGNOSIS — I509 Heart failure, unspecified: Secondary | ICD-10-CM | POA: Diagnosis not present

## 2016-08-20 DIAGNOSIS — Z950 Presence of cardiac pacemaker: Secondary | ICD-10-CM | POA: Diagnosis not present

## 2016-08-20 DIAGNOSIS — Z794 Long term (current) use of insulin: Secondary | ICD-10-CM | POA: Diagnosis not present

## 2016-08-20 DIAGNOSIS — Z7901 Long term (current) use of anticoagulants: Secondary | ICD-10-CM | POA: Diagnosis not present

## 2016-08-23 DIAGNOSIS — E119 Type 2 diabetes mellitus without complications: Secondary | ICD-10-CM | POA: Diagnosis not present

## 2016-08-23 DIAGNOSIS — Z7901 Long term (current) use of anticoagulants: Secondary | ICD-10-CM | POA: Diagnosis not present

## 2016-08-23 DIAGNOSIS — I110 Hypertensive heart disease with heart failure: Secondary | ICD-10-CM | POA: Diagnosis not present

## 2016-08-23 DIAGNOSIS — Z9181 History of falling: Secondary | ICD-10-CM | POA: Diagnosis not present

## 2016-08-23 DIAGNOSIS — R2689 Other abnormalities of gait and mobility: Secondary | ICD-10-CM | POA: Diagnosis not present

## 2016-08-23 DIAGNOSIS — Z96651 Presence of right artificial knee joint: Secondary | ICD-10-CM | POA: Diagnosis not present

## 2016-08-23 DIAGNOSIS — Z950 Presence of cardiac pacemaker: Secondary | ICD-10-CM | POA: Diagnosis not present

## 2016-08-23 DIAGNOSIS — Z794 Long term (current) use of insulin: Secondary | ICD-10-CM | POA: Diagnosis not present

## 2016-08-23 DIAGNOSIS — I509 Heart failure, unspecified: Secondary | ICD-10-CM | POA: Diagnosis not present

## 2016-08-23 DIAGNOSIS — Z5181 Encounter for therapeutic drug level monitoring: Secondary | ICD-10-CM | POA: Diagnosis not present

## 2016-08-24 NOTE — Telephone Encounter (Signed)
Left message to confirm she received the message.

## 2016-08-25 ENCOUNTER — Ambulatory Visit (INDEPENDENT_AMBULATORY_CARE_PROVIDER_SITE_OTHER): Payer: Medicare Other | Admitting: Family Medicine

## 2016-08-25 ENCOUNTER — Encounter: Payer: Self-pay | Admitting: Family Medicine

## 2016-08-25 VITALS — BP 110/57 | HR 75 | Wt 180.0 lb

## 2016-08-25 DIAGNOSIS — Z23 Encounter for immunization: Secondary | ICD-10-CM | POA: Insufficient documentation

## 2016-08-25 DIAGNOSIS — E1143 Type 2 diabetes mellitus with diabetic autonomic (poly)neuropathy: Secondary | ICD-10-CM

## 2016-08-25 DIAGNOSIS — Z794 Long term (current) use of insulin: Secondary | ICD-10-CM | POA: Diagnosis not present

## 2016-08-25 DIAGNOSIS — E119 Type 2 diabetes mellitus without complications: Secondary | ICD-10-CM | POA: Diagnosis not present

## 2016-08-25 DIAGNOSIS — I509 Heart failure, unspecified: Secondary | ICD-10-CM | POA: Diagnosis not present

## 2016-08-25 DIAGNOSIS — I110 Hypertensive heart disease with heart failure: Secondary | ICD-10-CM | POA: Diagnosis not present

## 2016-08-25 DIAGNOSIS — Z5181 Encounter for therapeutic drug level monitoring: Secondary | ICD-10-CM | POA: Diagnosis not present

## 2016-08-25 DIAGNOSIS — Z9181 History of falling: Secondary | ICD-10-CM | POA: Diagnosis not present

## 2016-08-25 DIAGNOSIS — R2689 Other abnormalities of gait and mobility: Secondary | ICD-10-CM | POA: Diagnosis not present

## 2016-08-25 DIAGNOSIS — Z7901 Long term (current) use of anticoagulants: Secondary | ICD-10-CM | POA: Diagnosis not present

## 2016-08-25 DIAGNOSIS — Z950 Presence of cardiac pacemaker: Secondary | ICD-10-CM | POA: Diagnosis not present

## 2016-08-25 DIAGNOSIS — Z96651 Presence of right artificial knee joint: Secondary | ICD-10-CM | POA: Diagnosis not present

## 2016-08-25 LAB — POCT GLYCOSYLATED HEMOGLOBIN (HGB A1C): HEMOGLOBIN A1C: 6.5

## 2016-08-25 MED ORDER — PANTOPRAZOLE SODIUM 40 MG PO TBEC: DELAYED_RELEASE_TABLET | ORAL | 4 refills | Status: DC

## 2016-08-25 NOTE — Progress Notes (Signed)
Subjective:    CC: DM  HPI:  Diabetes - no hypoglycemic events. No wounds or sores that are not healing well. No increased thirst or urination. Checking glucose at home.She brought in her log with her today. Morning blood sugars range from 62 up 239. Evening blood sugars range from 172-426. Taking medications as prescribed without any side effects.  Couldn't get the [place] bc of cost.   She had a right femur fracture in 2016 after a fall. 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 Then on June 9 she started developing redness and swelling and had to go to the OR for irrigation and debridement sure screw out Enterococcus faecalis and she was treated with IV antibiotics. She is now being seen by infectious disease and will be on lifelong antibiotics. She is now had some decreased range of motion but has been participating in physical therapy. He is now on amoxicillin 500 mg 3 times a day for suppressive therapy.  GERD- dong well on protonix.  Needs refill today.     Past medical history, Surgical history, Family history not pertinant except as noted below, Social history, Allergies, and medications have been entered into the medical record, reviewed, and corrections made.   Review of Systems: No fevers, chills, night sweats, weight loss, chest pain, or shortness of breath.   Objective:    General: Well Developed, well nourished, and in no acute distress.  Neuro: Alert and oriented x3, extra-ocular muscles intact, sensation grossly intact.  HEENT: Normocephalic, atraumatic  Skin: Warm and dry, no rashes. Cardiac: Regular rate and rhythm, no murmurs rubs or gallops, no lower extremity edema.  Respiratory: Clear to auscultation bilaterally. Not using accessory muscles, speaking in full sentences.   Impression and Recommendations:   DM- Hemoglobin A1c of 6.5 today Well controlled but she still having some hypoglycemia in the morning. Decrease evening Lantus down to 20 units.  That way she'll do 20 units twice a day. Also see if we have any samples to give her. I also encouraged her to keep track of the nights where her blood sugars are 400 or higher. If she can jot down what she ate for dinner and lunch that day it might help [place] pinpoint if there something specific that she is eating that is really pushing her sugar on those evenings.  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 Following with infectious disease.  GERD- RF med.    Pressure is a little bit low today. She is on losartan for renal protection with her diabetes. Normally her blood pressure runs a little bit higher so we will keep an eye on this. If it continues to drop we may need to decrease the medication.

## 2016-08-25 NOTE — Patient Instructions (Signed)
Decrease Lantus to 20 units twice a day. Your goal for blood sugars in the morning is between 80 and 120.

## 2016-08-27 ENCOUNTER — Ambulatory Visit (INDEPENDENT_AMBULATORY_CARE_PROVIDER_SITE_OTHER): Payer: Medicare Other | Admitting: Pharmacist

## 2016-08-27 DIAGNOSIS — I509 Heart failure, unspecified: Secondary | ICD-10-CM | POA: Diagnosis not present

## 2016-08-27 DIAGNOSIS — R2689 Other abnormalities of gait and mobility: Secondary | ICD-10-CM | POA: Diagnosis not present

## 2016-08-27 DIAGNOSIS — Z9181 History of falling: Secondary | ICD-10-CM | POA: Diagnosis not present

## 2016-08-27 DIAGNOSIS — Z5181 Encounter for therapeutic drug level monitoring: Secondary | ICD-10-CM | POA: Diagnosis not present

## 2016-08-27 DIAGNOSIS — I110 Hypertensive heart disease with heart failure: Secondary | ICD-10-CM | POA: Diagnosis not present

## 2016-08-27 DIAGNOSIS — E119 Type 2 diabetes mellitus without complications: Secondary | ICD-10-CM | POA: Diagnosis not present

## 2016-08-27 DIAGNOSIS — Z96651 Presence of right artificial knee joint: Secondary | ICD-10-CM | POA: Diagnosis not present

## 2016-08-27 DIAGNOSIS — Z794 Long term (current) use of insulin: Secondary | ICD-10-CM | POA: Diagnosis not present

## 2016-08-27 DIAGNOSIS — Z950 Presence of cardiac pacemaker: Secondary | ICD-10-CM | POA: Diagnosis not present

## 2016-08-27 DIAGNOSIS — Z7901 Long term (current) use of anticoagulants: Secondary | ICD-10-CM | POA: Diagnosis not present

## 2016-08-27 LAB — POCT INR: INR: 2.5

## 2016-09-01 DIAGNOSIS — Z79899 Other long term (current) drug therapy: Secondary | ICD-10-CM | POA: Diagnosis not present

## 2016-09-01 DIAGNOSIS — Z7901 Long term (current) use of anticoagulants: Secondary | ICD-10-CM | POA: Diagnosis not present

## 2016-09-01 DIAGNOSIS — Z794 Long term (current) use of insulin: Secondary | ICD-10-CM | POA: Diagnosis not present

## 2016-09-01 DIAGNOSIS — E119 Type 2 diabetes mellitus without complications: Secondary | ICD-10-CM | POA: Diagnosis not present

## 2016-09-01 DIAGNOSIS — R2689 Other abnormalities of gait and mobility: Secondary | ICD-10-CM | POA: Diagnosis not present

## 2016-09-01 DIAGNOSIS — S72401D Unspecified fracture of lower end of right femur, subsequent encounter for closed fracture with routine healing: Secondary | ICD-10-CM | POA: Diagnosis not present

## 2016-09-01 DIAGNOSIS — I509 Heart failure, unspecified: Secondary | ICD-10-CM | POA: Diagnosis not present

## 2016-09-01 DIAGNOSIS — Z96651 Presence of right artificial knee joint: Secondary | ICD-10-CM | POA: Diagnosis not present

## 2016-09-01 DIAGNOSIS — Z950 Presence of cardiac pacemaker: Secondary | ICD-10-CM | POA: Diagnosis not present

## 2016-09-01 DIAGNOSIS — I4891 Unspecified atrial fibrillation: Secondary | ICD-10-CM | POA: Diagnosis not present

## 2016-09-01 DIAGNOSIS — I110 Hypertensive heart disease with heart failure: Secondary | ICD-10-CM | POA: Diagnosis not present

## 2016-09-01 DIAGNOSIS — Z5181 Encounter for therapeutic drug level monitoring: Secondary | ICD-10-CM | POA: Diagnosis not present

## 2016-09-01 DIAGNOSIS — S72491D Other fracture of lower end of right femur, subsequent encounter for closed fracture with routine healing: Secondary | ICD-10-CM | POA: Diagnosis not present

## 2016-09-01 DIAGNOSIS — I10 Essential (primary) hypertension: Secondary | ICD-10-CM | POA: Diagnosis not present

## 2016-09-01 DIAGNOSIS — Z9181 History of falling: Secondary | ICD-10-CM | POA: Diagnosis not present

## 2016-09-01 DIAGNOSIS — E785 Hyperlipidemia, unspecified: Secondary | ICD-10-CM | POA: Diagnosis not present

## 2016-09-02 DIAGNOSIS — R2689 Other abnormalities of gait and mobility: Secondary | ICD-10-CM | POA: Diagnosis not present

## 2016-09-02 DIAGNOSIS — Z96651 Presence of right artificial knee joint: Secondary | ICD-10-CM | POA: Diagnosis not present

## 2016-09-02 DIAGNOSIS — Z950 Presence of cardiac pacemaker: Secondary | ICD-10-CM | POA: Diagnosis not present

## 2016-09-02 DIAGNOSIS — I110 Hypertensive heart disease with heart failure: Secondary | ICD-10-CM | POA: Diagnosis not present

## 2016-09-02 DIAGNOSIS — Z7901 Long term (current) use of anticoagulants: Secondary | ICD-10-CM | POA: Diagnosis not present

## 2016-09-02 DIAGNOSIS — Z9181 History of falling: Secondary | ICD-10-CM | POA: Diagnosis not present

## 2016-09-02 DIAGNOSIS — Z794 Long term (current) use of insulin: Secondary | ICD-10-CM | POA: Diagnosis not present

## 2016-09-02 DIAGNOSIS — Z5181 Encounter for therapeutic drug level monitoring: Secondary | ICD-10-CM | POA: Diagnosis not present

## 2016-09-02 DIAGNOSIS — E119 Type 2 diabetes mellitus without complications: Secondary | ICD-10-CM | POA: Diagnosis not present

## 2016-09-02 DIAGNOSIS — I509 Heart failure, unspecified: Secondary | ICD-10-CM | POA: Diagnosis not present

## 2016-09-03 DIAGNOSIS — Z96651 Presence of right artificial knee joint: Secondary | ICD-10-CM | POA: Diagnosis not present

## 2016-09-03 DIAGNOSIS — Z794 Long term (current) use of insulin: Secondary | ICD-10-CM | POA: Diagnosis not present

## 2016-09-03 DIAGNOSIS — R2689 Other abnormalities of gait and mobility: Secondary | ICD-10-CM | POA: Diagnosis not present

## 2016-09-03 DIAGNOSIS — Z7901 Long term (current) use of anticoagulants: Secondary | ICD-10-CM | POA: Diagnosis not present

## 2016-09-03 DIAGNOSIS — E119 Type 2 diabetes mellitus without complications: Secondary | ICD-10-CM | POA: Diagnosis not present

## 2016-09-03 DIAGNOSIS — Z950 Presence of cardiac pacemaker: Secondary | ICD-10-CM | POA: Diagnosis not present

## 2016-09-03 DIAGNOSIS — I110 Hypertensive heart disease with heart failure: Secondary | ICD-10-CM | POA: Diagnosis not present

## 2016-09-03 DIAGNOSIS — Z5181 Encounter for therapeutic drug level monitoring: Secondary | ICD-10-CM | POA: Diagnosis not present

## 2016-09-03 DIAGNOSIS — I509 Heart failure, unspecified: Secondary | ICD-10-CM | POA: Diagnosis not present

## 2016-09-03 DIAGNOSIS — Z9181 History of falling: Secondary | ICD-10-CM | POA: Diagnosis not present

## 2016-09-10 ENCOUNTER — Ambulatory Visit (INDEPENDENT_AMBULATORY_CARE_PROVIDER_SITE_OTHER): Payer: Medicare Other | Admitting: Pharmacist Clinician (PhC)/ Clinical Pharmacy Specialist

## 2016-09-10 DIAGNOSIS — Z9181 History of falling: Secondary | ICD-10-CM | POA: Diagnosis not present

## 2016-09-10 DIAGNOSIS — Z794 Long term (current) use of insulin: Secondary | ICD-10-CM | POA: Diagnosis not present

## 2016-09-10 DIAGNOSIS — Z9689 Presence of other specified functional implants: Secondary | ICD-10-CM | POA: Diagnosis not present

## 2016-09-10 DIAGNOSIS — S72351D Displaced comminuted fracture of shaft of right femur, subsequent encounter for closed fracture with routine healing: Secondary | ICD-10-CM | POA: Diagnosis not present

## 2016-09-10 DIAGNOSIS — R2689 Other abnormalities of gait and mobility: Secondary | ICD-10-CM | POA: Diagnosis not present

## 2016-09-10 DIAGNOSIS — Z87898 Personal history of other specified conditions: Secondary | ICD-10-CM | POA: Diagnosis not present

## 2016-09-10 DIAGNOSIS — I110 Hypertensive heart disease with heart failure: Secondary | ICD-10-CM | POA: Diagnosis not present

## 2016-09-10 DIAGNOSIS — M25461 Effusion, right knee: Secondary | ICD-10-CM | POA: Diagnosis not present

## 2016-09-10 DIAGNOSIS — I872 Venous insufficiency (chronic) (peripheral): Secondary | ICD-10-CM | POA: Diagnosis not present

## 2016-09-10 DIAGNOSIS — E119 Type 2 diabetes mellitus without complications: Secondary | ICD-10-CM | POA: Diagnosis not present

## 2016-09-10 DIAGNOSIS — S8012XA Contusion of left lower leg, initial encounter: Secondary | ICD-10-CM | POA: Diagnosis not present

## 2016-09-10 DIAGNOSIS — Z7901 Long term (current) use of anticoagulants: Secondary | ICD-10-CM | POA: Diagnosis not present

## 2016-09-10 DIAGNOSIS — R600 Localized edema: Secondary | ICD-10-CM | POA: Diagnosis not present

## 2016-09-10 DIAGNOSIS — Z5181 Encounter for therapeutic drug level monitoring: Secondary | ICD-10-CM | POA: Diagnosis not present

## 2016-09-10 DIAGNOSIS — M7989 Other specified soft tissue disorders: Secondary | ICD-10-CM | POA: Diagnosis not present

## 2016-09-10 DIAGNOSIS — Z950 Presence of cardiac pacemaker: Secondary | ICD-10-CM | POA: Diagnosis not present

## 2016-09-10 DIAGNOSIS — Z96651 Presence of right artificial knee joint: Secondary | ICD-10-CM | POA: Diagnosis not present

## 2016-09-10 DIAGNOSIS — Z8781 Personal history of (healed) traumatic fracture: Secondary | ICD-10-CM | POA: Diagnosis not present

## 2016-09-10 DIAGNOSIS — S8011XA Contusion of right lower leg, initial encounter: Secondary | ICD-10-CM | POA: Diagnosis not present

## 2016-09-10 DIAGNOSIS — Z8639 Personal history of other endocrine, nutritional and metabolic disease: Secondary | ICD-10-CM | POA: Diagnosis not present

## 2016-09-10 DIAGNOSIS — I509 Heart failure, unspecified: Secondary | ICD-10-CM | POA: Diagnosis not present

## 2016-09-10 LAB — POCT INR: INR: 2.8

## 2016-09-13 DIAGNOSIS — I110 Hypertensive heart disease with heart failure: Secondary | ICD-10-CM | POA: Diagnosis not present

## 2016-09-13 DIAGNOSIS — R2689 Other abnormalities of gait and mobility: Secondary | ICD-10-CM | POA: Diagnosis not present

## 2016-09-13 DIAGNOSIS — Z794 Long term (current) use of insulin: Secondary | ICD-10-CM | POA: Diagnosis not present

## 2016-09-13 DIAGNOSIS — Z96651 Presence of right artificial knee joint: Secondary | ICD-10-CM | POA: Diagnosis not present

## 2016-09-13 DIAGNOSIS — Z950 Presence of cardiac pacemaker: Secondary | ICD-10-CM | POA: Diagnosis not present

## 2016-09-13 DIAGNOSIS — I509 Heart failure, unspecified: Secondary | ICD-10-CM | POA: Diagnosis not present

## 2016-09-13 DIAGNOSIS — Z7901 Long term (current) use of anticoagulants: Secondary | ICD-10-CM | POA: Diagnosis not present

## 2016-09-13 DIAGNOSIS — E119 Type 2 diabetes mellitus without complications: Secondary | ICD-10-CM | POA: Diagnosis not present

## 2016-09-13 DIAGNOSIS — Z9181 History of falling: Secondary | ICD-10-CM | POA: Diagnosis not present

## 2016-09-13 DIAGNOSIS — Z5181 Encounter for therapeutic drug level monitoring: Secondary | ICD-10-CM | POA: Diagnosis not present

## 2016-09-15 ENCOUNTER — Telehealth: Payer: Self-pay | Admitting: *Deleted

## 2016-09-15 DIAGNOSIS — E119 Type 2 diabetes mellitus without complications: Secondary | ICD-10-CM | POA: Diagnosis not present

## 2016-09-15 DIAGNOSIS — F05 Delirium due to known physiological condition: Secondary | ICD-10-CM

## 2016-09-15 DIAGNOSIS — Z794 Long term (current) use of insulin: Secondary | ICD-10-CM | POA: Diagnosis not present

## 2016-09-15 DIAGNOSIS — Z96651 Presence of right artificial knee joint: Secondary | ICD-10-CM | POA: Diagnosis not present

## 2016-09-15 DIAGNOSIS — Z7901 Long term (current) use of anticoagulants: Secondary | ICD-10-CM | POA: Diagnosis not present

## 2016-09-15 DIAGNOSIS — I110 Hypertensive heart disease with heart failure: Secondary | ICD-10-CM | POA: Diagnosis not present

## 2016-09-15 DIAGNOSIS — Z5181 Encounter for therapeutic drug level monitoring: Secondary | ICD-10-CM | POA: Diagnosis not present

## 2016-09-15 DIAGNOSIS — Z87440 Personal history of urinary (tract) infections: Secondary | ICD-10-CM

## 2016-09-15 DIAGNOSIS — R2689 Other abnormalities of gait and mobility: Secondary | ICD-10-CM | POA: Diagnosis not present

## 2016-09-15 DIAGNOSIS — Z950 Presence of cardiac pacemaker: Secondary | ICD-10-CM | POA: Diagnosis not present

## 2016-09-15 DIAGNOSIS — Z9181 History of falling: Secondary | ICD-10-CM | POA: Diagnosis not present

## 2016-09-15 DIAGNOSIS — I509 Heart failure, unspecified: Secondary | ICD-10-CM | POA: Diagnosis not present

## 2016-09-15 MED ORDER — AMBULATORY NON FORMULARY MEDICATION
0 refills | Status: DC
Start: 2016-09-15 — End: 2016-09-15

## 2016-09-15 MED ORDER — AMBULATORY NON FORMULARY MEDICATION
0 refills | Status: DC
Start: 2016-09-15 — End: 2016-12-23

## 2016-09-15 NOTE — Telephone Encounter (Signed)
[name] a nurse from [facility]  would like an order for u/a with culture and sensitivities faxed to [phone]. The patient is having some confusion and has a history of uti's. Order faxed

## 2016-09-15 NOTE — Telephone Encounter (Signed)
Patient seen in office visit.

## 2016-09-16 ENCOUNTER — Encounter: Payer: Self-pay | Admitting: Family Medicine

## 2016-09-16 ENCOUNTER — Ambulatory Visit (INDEPENDENT_AMBULATORY_CARE_PROVIDER_SITE_OTHER): Payer: Medicare Other | Admitting: Pharmacist

## 2016-09-16 DIAGNOSIS — Z7901 Long term (current) use of anticoagulants: Secondary | ICD-10-CM

## 2016-09-16 DIAGNOSIS — Z5181 Encounter for therapeutic drug level monitoring: Secondary | ICD-10-CM | POA: Diagnosis not present

## 2016-09-16 DIAGNOSIS — Z96651 Presence of right artificial knee joint: Secondary | ICD-10-CM | POA: Diagnosis not present

## 2016-09-16 DIAGNOSIS — I110 Hypertensive heart disease with heart failure: Secondary | ICD-10-CM | POA: Diagnosis not present

## 2016-09-16 DIAGNOSIS — Z950 Presence of cardiac pacemaker: Secondary | ICD-10-CM | POA: Diagnosis not present

## 2016-09-16 DIAGNOSIS — N390 Urinary tract infection, site not specified: Secondary | ICD-10-CM | POA: Diagnosis not present

## 2016-09-16 DIAGNOSIS — R2689 Other abnormalities of gait and mobility: Secondary | ICD-10-CM | POA: Diagnosis not present

## 2016-09-16 DIAGNOSIS — Z794 Long term (current) use of insulin: Secondary | ICD-10-CM | POA: Diagnosis not present

## 2016-09-16 DIAGNOSIS — Z9181 History of falling: Secondary | ICD-10-CM | POA: Diagnosis not present

## 2016-09-16 DIAGNOSIS — E119 Type 2 diabetes mellitus without complications: Secondary | ICD-10-CM | POA: Diagnosis not present

## 2016-09-16 DIAGNOSIS — I509 Heart failure, unspecified: Secondary | ICD-10-CM | POA: Diagnosis not present

## 2016-09-16 LAB — POCT INR: INR: 1.9

## 2016-09-17 DIAGNOSIS — E119 Type 2 diabetes mellitus without complications: Secondary | ICD-10-CM | POA: Diagnosis not present

## 2016-09-17 DIAGNOSIS — Z5181 Encounter for therapeutic drug level monitoring: Secondary | ICD-10-CM | POA: Diagnosis not present

## 2016-09-17 DIAGNOSIS — R2689 Other abnormalities of gait and mobility: Secondary | ICD-10-CM | POA: Diagnosis not present

## 2016-09-17 DIAGNOSIS — Z9181 History of falling: Secondary | ICD-10-CM | POA: Diagnosis not present

## 2016-09-17 DIAGNOSIS — I110 Hypertensive heart disease with heart failure: Secondary | ICD-10-CM | POA: Diagnosis not present

## 2016-09-17 DIAGNOSIS — Z7901 Long term (current) use of anticoagulants: Secondary | ICD-10-CM | POA: Diagnosis not present

## 2016-09-17 DIAGNOSIS — Z950 Presence of cardiac pacemaker: Secondary | ICD-10-CM | POA: Diagnosis not present

## 2016-09-17 DIAGNOSIS — Z794 Long term (current) use of insulin: Secondary | ICD-10-CM | POA: Diagnosis not present

## 2016-09-17 DIAGNOSIS — Z96651 Presence of right artificial knee joint: Secondary | ICD-10-CM | POA: Diagnosis not present

## 2016-09-17 DIAGNOSIS — I509 Heart failure, unspecified: Secondary | ICD-10-CM | POA: Diagnosis not present

## 2016-09-20 DIAGNOSIS — E119 Type 2 diabetes mellitus without complications: Secondary | ICD-10-CM | POA: Diagnosis not present

## 2016-09-20 DIAGNOSIS — Z7901 Long term (current) use of anticoagulants: Secondary | ICD-10-CM | POA: Diagnosis not present

## 2016-09-20 DIAGNOSIS — I509 Heart failure, unspecified: Secondary | ICD-10-CM | POA: Diagnosis not present

## 2016-09-20 DIAGNOSIS — Z9181 History of falling: Secondary | ICD-10-CM | POA: Diagnosis not present

## 2016-09-20 DIAGNOSIS — R2689 Other abnormalities of gait and mobility: Secondary | ICD-10-CM | POA: Diagnosis not present

## 2016-09-20 DIAGNOSIS — Z96651 Presence of right artificial knee joint: Secondary | ICD-10-CM | POA: Diagnosis not present

## 2016-09-20 DIAGNOSIS — I110 Hypertensive heart disease with heart failure: Secondary | ICD-10-CM | POA: Diagnosis not present

## 2016-09-20 DIAGNOSIS — Z950 Presence of cardiac pacemaker: Secondary | ICD-10-CM | POA: Diagnosis not present

## 2016-09-20 DIAGNOSIS — Z794 Long term (current) use of insulin: Secondary | ICD-10-CM | POA: Diagnosis not present

## 2016-09-20 DIAGNOSIS — Z5181 Encounter for therapeutic drug level monitoring: Secondary | ICD-10-CM | POA: Diagnosis not present

## 2016-09-22 DIAGNOSIS — I110 Hypertensive heart disease with heart failure: Secondary | ICD-10-CM | POA: Diagnosis not present

## 2016-09-22 DIAGNOSIS — Z96651 Presence of right artificial knee joint: Secondary | ICD-10-CM | POA: Diagnosis not present

## 2016-09-22 DIAGNOSIS — R2689 Other abnormalities of gait and mobility: Secondary | ICD-10-CM | POA: Diagnosis not present

## 2016-09-22 DIAGNOSIS — I509 Heart failure, unspecified: Secondary | ICD-10-CM | POA: Diagnosis not present

## 2016-09-22 DIAGNOSIS — Z9181 History of falling: Secondary | ICD-10-CM | POA: Diagnosis not present

## 2016-09-22 DIAGNOSIS — Z7901 Long term (current) use of anticoagulants: Secondary | ICD-10-CM | POA: Diagnosis not present

## 2016-09-22 DIAGNOSIS — E119 Type 2 diabetes mellitus without complications: Secondary | ICD-10-CM | POA: Diagnosis not present

## 2016-09-22 DIAGNOSIS — Z794 Long term (current) use of insulin: Secondary | ICD-10-CM | POA: Diagnosis not present

## 2016-09-22 DIAGNOSIS — Z950 Presence of cardiac pacemaker: Secondary | ICD-10-CM | POA: Diagnosis not present

## 2016-09-22 DIAGNOSIS — Z5181 Encounter for therapeutic drug level monitoring: Secondary | ICD-10-CM | POA: Diagnosis not present

## 2016-09-23 ENCOUNTER — Ambulatory Visit (INDEPENDENT_AMBULATORY_CARE_PROVIDER_SITE_OTHER): Payer: Medicare Other | Admitting: Pharmacist

## 2016-09-23 DIAGNOSIS — Z96651 Presence of right artificial knee joint: Secondary | ICD-10-CM | POA: Diagnosis not present

## 2016-09-23 DIAGNOSIS — Z5181 Encounter for therapeutic drug level monitoring: Secondary | ICD-10-CM | POA: Diagnosis not present

## 2016-09-23 DIAGNOSIS — R2689 Other abnormalities of gait and mobility: Secondary | ICD-10-CM | POA: Diagnosis not present

## 2016-09-23 DIAGNOSIS — I110 Hypertensive heart disease with heart failure: Secondary | ICD-10-CM | POA: Diagnosis not present

## 2016-09-23 DIAGNOSIS — Z7901 Long term (current) use of anticoagulants: Secondary | ICD-10-CM

## 2016-09-23 DIAGNOSIS — Z950 Presence of cardiac pacemaker: Secondary | ICD-10-CM | POA: Diagnosis not present

## 2016-09-23 DIAGNOSIS — E119 Type 2 diabetes mellitus without complications: Secondary | ICD-10-CM | POA: Diagnosis not present

## 2016-09-23 DIAGNOSIS — I509 Heart failure, unspecified: Secondary | ICD-10-CM | POA: Diagnosis not present

## 2016-09-23 DIAGNOSIS — Z9181 History of falling: Secondary | ICD-10-CM | POA: Diagnosis not present

## 2016-09-23 DIAGNOSIS — Z794 Long term (current) use of insulin: Secondary | ICD-10-CM | POA: Diagnosis not present

## 2016-09-23 LAB — POCT INR: INR: 2

## 2016-09-24 ENCOUNTER — Telehealth: Payer: Self-pay | Admitting: Family Medicine

## 2016-09-24 DIAGNOSIS — Z950 Presence of cardiac pacemaker: Secondary | ICD-10-CM | POA: Diagnosis not present

## 2016-09-24 DIAGNOSIS — I509 Heart failure, unspecified: Secondary | ICD-10-CM | POA: Diagnosis not present

## 2016-09-24 DIAGNOSIS — Z9181 History of falling: Secondary | ICD-10-CM | POA: Diagnosis not present

## 2016-09-24 DIAGNOSIS — E119 Type 2 diabetes mellitus without complications: Secondary | ICD-10-CM | POA: Diagnosis not present

## 2016-09-24 DIAGNOSIS — Z96651 Presence of right artificial knee joint: Secondary | ICD-10-CM | POA: Diagnosis not present

## 2016-09-24 DIAGNOSIS — Z5181 Encounter for therapeutic drug level monitoring: Secondary | ICD-10-CM | POA: Diagnosis not present

## 2016-09-24 DIAGNOSIS — I110 Hypertensive heart disease with heart failure: Secondary | ICD-10-CM | POA: Diagnosis not present

## 2016-09-24 DIAGNOSIS — R2689 Other abnormalities of gait and mobility: Secondary | ICD-10-CM | POA: Diagnosis not present

## 2016-09-24 DIAGNOSIS — Z794 Long term (current) use of insulin: Secondary | ICD-10-CM | POA: Diagnosis not present

## 2016-09-24 DIAGNOSIS — Z7901 Long term (current) use of anticoagulants: Secondary | ICD-10-CM | POA: Diagnosis not present

## 2016-09-24 NOTE — Telephone Encounter (Signed)
lvm asking daughter to return call about her mother's sxs.[name]

## 2016-09-24 NOTE — Telephone Encounter (Signed)
Please call pt and see what sxs she is having to decide to treat for UTI or not.  Culture is positive for Klebsiella.  Sensitive to Cipro.   Beatrice Lecher, MD

## 2016-09-27 DIAGNOSIS — I509 Heart failure, unspecified: Secondary | ICD-10-CM | POA: Diagnosis not present

## 2016-09-27 DIAGNOSIS — Z794 Long term (current) use of insulin: Secondary | ICD-10-CM | POA: Diagnosis not present

## 2016-09-27 DIAGNOSIS — Z7901 Long term (current) use of anticoagulants: Secondary | ICD-10-CM | POA: Diagnosis not present

## 2016-09-27 DIAGNOSIS — Z5181 Encounter for therapeutic drug level monitoring: Secondary | ICD-10-CM | POA: Diagnosis not present

## 2016-09-27 DIAGNOSIS — Z96651 Presence of right artificial knee joint: Secondary | ICD-10-CM | POA: Diagnosis not present

## 2016-09-27 DIAGNOSIS — R2689 Other abnormalities of gait and mobility: Secondary | ICD-10-CM | POA: Diagnosis not present

## 2016-09-27 DIAGNOSIS — E119 Type 2 diabetes mellitus without complications: Secondary | ICD-10-CM | POA: Diagnosis not present

## 2016-09-27 DIAGNOSIS — Z9181 History of falling: Secondary | ICD-10-CM | POA: Diagnosis not present

## 2016-09-27 DIAGNOSIS — Z950 Presence of cardiac pacemaker: Secondary | ICD-10-CM | POA: Diagnosis not present

## 2016-09-27 DIAGNOSIS — I110 Hypertensive heart disease with heart failure: Secondary | ICD-10-CM | POA: Diagnosis not present

## 2016-09-27 MED ORDER — CIPROFLOXACIN HCL 500 MG PO TABS: 500.0000 mg | ORAL_TABLET | Freq: Two times a day (BID) | ORAL | 0 refills | Status: AC

## 2016-09-27 NOTE — Telephone Encounter (Signed)
Spoke with pt and she informed me that she has been experiencing frequency and burning. Will fwd to pcp.[name] [place]

## 2016-09-29 DIAGNOSIS — Z5181 Encounter for therapeutic drug level monitoring: Secondary | ICD-10-CM | POA: Diagnosis not present

## 2016-09-29 DIAGNOSIS — R2689 Other abnormalities of gait and mobility: Secondary | ICD-10-CM | POA: Diagnosis not present

## 2016-09-29 DIAGNOSIS — Z7901 Long term (current) use of anticoagulants: Secondary | ICD-10-CM | POA: Diagnosis not present

## 2016-09-29 DIAGNOSIS — Z96651 Presence of right artificial knee joint: Secondary | ICD-10-CM | POA: Diagnosis not present

## 2016-09-29 DIAGNOSIS — E119 Type 2 diabetes mellitus without complications: Secondary | ICD-10-CM | POA: Diagnosis not present

## 2016-09-29 DIAGNOSIS — I509 Heart failure, unspecified: Secondary | ICD-10-CM | POA: Diagnosis not present

## 2016-09-29 DIAGNOSIS — Z9181 History of falling: Secondary | ICD-10-CM | POA: Diagnosis not present

## 2016-09-29 DIAGNOSIS — I110 Hypertensive heart disease with heart failure: Secondary | ICD-10-CM | POA: Diagnosis not present

## 2016-09-29 DIAGNOSIS — Z950 Presence of cardiac pacemaker: Secondary | ICD-10-CM | POA: Diagnosis not present

## 2016-09-29 DIAGNOSIS — Z794 Long term (current) use of insulin: Secondary | ICD-10-CM | POA: Diagnosis not present

## 2016-09-30 ENCOUNTER — Ambulatory Visit (INDEPENDENT_AMBULATORY_CARE_PROVIDER_SITE_OTHER): Payer: Medicare Other | Admitting: Pharmacist

## 2016-09-30 DIAGNOSIS — Z96651 Presence of right artificial knee joint: Secondary | ICD-10-CM | POA: Diagnosis not present

## 2016-09-30 DIAGNOSIS — Z950 Presence of cardiac pacemaker: Secondary | ICD-10-CM | POA: Diagnosis not present

## 2016-09-30 DIAGNOSIS — I110 Hypertensive heart disease with heart failure: Secondary | ICD-10-CM | POA: Diagnosis not present

## 2016-09-30 DIAGNOSIS — Z7901 Long term (current) use of anticoagulants: Secondary | ICD-10-CM | POA: Diagnosis not present

## 2016-09-30 DIAGNOSIS — E119 Type 2 diabetes mellitus without complications: Secondary | ICD-10-CM | POA: Diagnosis not present

## 2016-09-30 DIAGNOSIS — I509 Heart failure, unspecified: Secondary | ICD-10-CM | POA: Diagnosis not present

## 2016-09-30 DIAGNOSIS — Z794 Long term (current) use of insulin: Secondary | ICD-10-CM | POA: Diagnosis not present

## 2016-09-30 DIAGNOSIS — Z9181 History of falling: Secondary | ICD-10-CM | POA: Diagnosis not present

## 2016-09-30 DIAGNOSIS — R2689 Other abnormalities of gait and mobility: Secondary | ICD-10-CM | POA: Diagnosis not present

## 2016-09-30 DIAGNOSIS — Z5181 Encounter for therapeutic drug level monitoring: Secondary | ICD-10-CM | POA: Diagnosis not present

## 2016-09-30 LAB — POCT INR: INR: 2

## 2016-10-01 DIAGNOSIS — I110 Hypertensive heart disease with heart failure: Secondary | ICD-10-CM | POA: Diagnosis not present

## 2016-10-01 DIAGNOSIS — Z794 Long term (current) use of insulin: Secondary | ICD-10-CM | POA: Diagnosis not present

## 2016-10-01 DIAGNOSIS — Z96651 Presence of right artificial knee joint: Secondary | ICD-10-CM | POA: Diagnosis not present

## 2016-10-01 DIAGNOSIS — Z9181 History of falling: Secondary | ICD-10-CM | POA: Diagnosis not present

## 2016-10-01 DIAGNOSIS — R2689 Other abnormalities of gait and mobility: Secondary | ICD-10-CM | POA: Diagnosis not present

## 2016-10-01 DIAGNOSIS — Z950 Presence of cardiac pacemaker: Secondary | ICD-10-CM | POA: Diagnosis not present

## 2016-10-01 DIAGNOSIS — I509 Heart failure, unspecified: Secondary | ICD-10-CM | POA: Diagnosis not present

## 2016-10-01 DIAGNOSIS — Z5181 Encounter for therapeutic drug level monitoring: Secondary | ICD-10-CM | POA: Diagnosis not present

## 2016-10-01 DIAGNOSIS — E119 Type 2 diabetes mellitus without complications: Secondary | ICD-10-CM | POA: Diagnosis not present

## 2016-10-01 DIAGNOSIS — Z7901 Long term (current) use of anticoagulants: Secondary | ICD-10-CM | POA: Diagnosis not present

## 2016-10-04 DIAGNOSIS — Z950 Presence of cardiac pacemaker: Secondary | ICD-10-CM | POA: Diagnosis not present

## 2016-10-04 DIAGNOSIS — E119 Type 2 diabetes mellitus without complications: Secondary | ICD-10-CM | POA: Diagnosis not present

## 2016-10-04 DIAGNOSIS — Z7901 Long term (current) use of anticoagulants: Secondary | ICD-10-CM | POA: Diagnosis not present

## 2016-10-04 DIAGNOSIS — Z794 Long term (current) use of insulin: Secondary | ICD-10-CM | POA: Diagnosis not present

## 2016-10-04 DIAGNOSIS — Z5181 Encounter for therapeutic drug level monitoring: Secondary | ICD-10-CM | POA: Diagnosis not present

## 2016-10-04 DIAGNOSIS — Z96651 Presence of right artificial knee joint: Secondary | ICD-10-CM | POA: Diagnosis not present

## 2016-10-04 DIAGNOSIS — I110 Hypertensive heart disease with heart failure: Secondary | ICD-10-CM | POA: Diagnosis not present

## 2016-10-04 DIAGNOSIS — R2689 Other abnormalities of gait and mobility: Secondary | ICD-10-CM | POA: Diagnosis not present

## 2016-10-04 DIAGNOSIS — Z9181 History of falling: Secondary | ICD-10-CM | POA: Diagnosis not present

## 2016-10-04 DIAGNOSIS — I509 Heart failure, unspecified: Secondary | ICD-10-CM | POA: Diagnosis not present

## 2016-10-05 DIAGNOSIS — Z7901 Long term (current) use of anticoagulants: Secondary | ICD-10-CM | POA: Diagnosis not present

## 2016-10-05 DIAGNOSIS — Z5181 Encounter for therapeutic drug level monitoring: Secondary | ICD-10-CM | POA: Diagnosis not present

## 2016-10-05 DIAGNOSIS — Z96651 Presence of right artificial knee joint: Secondary | ICD-10-CM | POA: Diagnosis not present

## 2016-10-05 DIAGNOSIS — I110 Hypertensive heart disease with heart failure: Secondary | ICD-10-CM | POA: Diagnosis not present

## 2016-10-05 DIAGNOSIS — Z950 Presence of cardiac pacemaker: Secondary | ICD-10-CM | POA: Diagnosis not present

## 2016-10-05 DIAGNOSIS — R2689 Other abnormalities of gait and mobility: Secondary | ICD-10-CM | POA: Diagnosis not present

## 2016-10-05 DIAGNOSIS — Z9181 History of falling: Secondary | ICD-10-CM | POA: Diagnosis not present

## 2016-10-05 DIAGNOSIS — Z794 Long term (current) use of insulin: Secondary | ICD-10-CM | POA: Diagnosis not present

## 2016-10-05 DIAGNOSIS — I509 Heart failure, unspecified: Secondary | ICD-10-CM | POA: Diagnosis not present

## 2016-10-05 DIAGNOSIS — E119 Type 2 diabetes mellitus without complications: Secondary | ICD-10-CM | POA: Diagnosis not present

## 2016-10-07 ENCOUNTER — Telehealth: Payer: Self-pay | Admitting: Cardiovascular Disease

## 2016-10-07 DIAGNOSIS — Z794 Long term (current) use of insulin: Secondary | ICD-10-CM | POA: Diagnosis not present

## 2016-10-07 DIAGNOSIS — Z96651 Presence of right artificial knee joint: Secondary | ICD-10-CM | POA: Diagnosis not present

## 2016-10-07 DIAGNOSIS — E119 Type 2 diabetes mellitus without complications: Secondary | ICD-10-CM | POA: Diagnosis not present

## 2016-10-07 DIAGNOSIS — I110 Hypertensive heart disease with heart failure: Secondary | ICD-10-CM | POA: Diagnosis not present

## 2016-10-07 DIAGNOSIS — Z9181 History of falling: Secondary | ICD-10-CM | POA: Diagnosis not present

## 2016-10-07 DIAGNOSIS — I509 Heart failure, unspecified: Secondary | ICD-10-CM | POA: Diagnosis not present

## 2016-10-07 DIAGNOSIS — Z950 Presence of cardiac pacemaker: Secondary | ICD-10-CM | POA: Diagnosis not present

## 2016-10-07 DIAGNOSIS — R2689 Other abnormalities of gait and mobility: Secondary | ICD-10-CM | POA: Diagnosis not present

## 2016-10-07 DIAGNOSIS — Z5181 Encounter for therapeutic drug level monitoring: Secondary | ICD-10-CM | POA: Diagnosis not present

## 2016-10-07 DIAGNOSIS — Z7901 Long term (current) use of anticoagulants: Secondary | ICD-10-CM | POA: Diagnosis not present

## 2016-10-07 NOTE — Telephone Encounter (Signed)
She wants to know if she have the diagnosis of CHF and what is her last EF?

## 2016-10-07 NOTE — Telephone Encounter (Signed)
Left msg for [facility] case manager w information & to call if further questions.

## 2016-10-08 DIAGNOSIS — E119 Type 2 diabetes mellitus without complications: Secondary | ICD-10-CM | POA: Diagnosis not present

## 2016-10-08 DIAGNOSIS — I110 Hypertensive heart disease with heart failure: Secondary | ICD-10-CM | POA: Diagnosis not present

## 2016-10-08 DIAGNOSIS — Z5181 Encounter for therapeutic drug level monitoring: Secondary | ICD-10-CM | POA: Diagnosis not present

## 2016-10-08 DIAGNOSIS — R2689 Other abnormalities of gait and mobility: Secondary | ICD-10-CM | POA: Diagnosis not present

## 2016-10-08 DIAGNOSIS — Z950 Presence of cardiac pacemaker: Secondary | ICD-10-CM | POA: Diagnosis not present

## 2016-10-08 DIAGNOSIS — Z7901 Long term (current) use of anticoagulants: Secondary | ICD-10-CM | POA: Diagnosis not present

## 2016-10-08 DIAGNOSIS — Z794 Long term (current) use of insulin: Secondary | ICD-10-CM | POA: Diagnosis not present

## 2016-10-08 DIAGNOSIS — Z96651 Presence of right artificial knee joint: Secondary | ICD-10-CM | POA: Diagnosis not present

## 2016-10-08 DIAGNOSIS — I509 Heart failure, unspecified: Secondary | ICD-10-CM | POA: Diagnosis not present

## 2016-10-08 DIAGNOSIS — Z9181 History of falling: Secondary | ICD-10-CM | POA: Diagnosis not present

## 2016-10-11 DIAGNOSIS — Z794 Long term (current) use of insulin: Secondary | ICD-10-CM | POA: Diagnosis not present

## 2016-10-11 DIAGNOSIS — Z9181 History of falling: Secondary | ICD-10-CM | POA: Diagnosis not present

## 2016-10-11 DIAGNOSIS — I110 Hypertensive heart disease with heart failure: Secondary | ICD-10-CM | POA: Diagnosis not present

## 2016-10-11 DIAGNOSIS — Z950 Presence of cardiac pacemaker: Secondary | ICD-10-CM | POA: Diagnosis not present

## 2016-10-11 DIAGNOSIS — E119 Type 2 diabetes mellitus without complications: Secondary | ICD-10-CM | POA: Diagnosis not present

## 2016-10-11 DIAGNOSIS — Z5181 Encounter for therapeutic drug level monitoring: Secondary | ICD-10-CM | POA: Diagnosis not present

## 2016-10-11 DIAGNOSIS — R2689 Other abnormalities of gait and mobility: Secondary | ICD-10-CM | POA: Diagnosis not present

## 2016-10-11 DIAGNOSIS — Z96651 Presence of right artificial knee joint: Secondary | ICD-10-CM | POA: Diagnosis not present

## 2016-10-11 DIAGNOSIS — I509 Heart failure, unspecified: Secondary | ICD-10-CM | POA: Diagnosis not present

## 2016-10-11 DIAGNOSIS — Z7901 Long term (current) use of anticoagulants: Secondary | ICD-10-CM | POA: Diagnosis not present

## 2016-10-13 ENCOUNTER — Ambulatory Visit (INDEPENDENT_AMBULATORY_CARE_PROVIDER_SITE_OTHER): Payer: Medicare Other | Admitting: Pharmacist Clinician (PhC)/ Clinical Pharmacy Specialist

## 2016-10-13 DIAGNOSIS — Z794 Long term (current) use of insulin: Secondary | ICD-10-CM | POA: Diagnosis not present

## 2016-10-13 DIAGNOSIS — Z7901 Long term (current) use of anticoagulants: Secondary | ICD-10-CM | POA: Diagnosis not present

## 2016-10-13 DIAGNOSIS — Z950 Presence of cardiac pacemaker: Secondary | ICD-10-CM | POA: Diagnosis not present

## 2016-10-13 DIAGNOSIS — I110 Hypertensive heart disease with heart failure: Secondary | ICD-10-CM | POA: Diagnosis not present

## 2016-10-13 DIAGNOSIS — Z5181 Encounter for therapeutic drug level monitoring: Secondary | ICD-10-CM | POA: Diagnosis not present

## 2016-10-13 DIAGNOSIS — Z9181 History of falling: Secondary | ICD-10-CM | POA: Diagnosis not present

## 2016-10-13 DIAGNOSIS — I509 Heart failure, unspecified: Secondary | ICD-10-CM | POA: Diagnosis not present

## 2016-10-13 DIAGNOSIS — Z96651 Presence of right artificial knee joint: Secondary | ICD-10-CM | POA: Diagnosis not present

## 2016-10-13 DIAGNOSIS — E119 Type 2 diabetes mellitus without complications: Secondary | ICD-10-CM | POA: Diagnosis not present

## 2016-10-13 DIAGNOSIS — R2689 Other abnormalities of gait and mobility: Secondary | ICD-10-CM | POA: Diagnosis not present

## 2016-10-13 LAB — POCT INR: INR: 2.4

## 2016-10-14 DIAGNOSIS — R2689 Other abnormalities of gait and mobility: Secondary | ICD-10-CM | POA: Diagnosis not present

## 2016-10-14 DIAGNOSIS — I110 Hypertensive heart disease with heart failure: Secondary | ICD-10-CM | POA: Diagnosis not present

## 2016-10-14 DIAGNOSIS — Z9181 History of falling: Secondary | ICD-10-CM | POA: Diagnosis not present

## 2016-10-14 DIAGNOSIS — E119 Type 2 diabetes mellitus without complications: Secondary | ICD-10-CM | POA: Diagnosis not present

## 2016-10-14 DIAGNOSIS — Z7901 Long term (current) use of anticoagulants: Secondary | ICD-10-CM | POA: Diagnosis not present

## 2016-10-14 DIAGNOSIS — Z5181 Encounter for therapeutic drug level monitoring: Secondary | ICD-10-CM | POA: Diagnosis not present

## 2016-10-14 DIAGNOSIS — Z794 Long term (current) use of insulin: Secondary | ICD-10-CM | POA: Diagnosis not present

## 2016-10-14 DIAGNOSIS — I509 Heart failure, unspecified: Secondary | ICD-10-CM | POA: Diagnosis not present

## 2016-10-14 DIAGNOSIS — Z96651 Presence of right artificial knee joint: Secondary | ICD-10-CM | POA: Diagnosis not present

## 2016-10-14 DIAGNOSIS — Z950 Presence of cardiac pacemaker: Secondary | ICD-10-CM | POA: Diagnosis not present

## 2016-10-15 ENCOUNTER — Telehealth: Payer: Self-pay | Admitting: *Deleted

## 2016-10-15 NOTE — Telephone Encounter (Signed)
VO given for pt to have continued PT, HHA , Nursing thru thanksgiving.[name] [place]

## 2016-10-18 DIAGNOSIS — R2689 Other abnormalities of gait and mobility: Secondary | ICD-10-CM | POA: Diagnosis not present

## 2016-10-18 DIAGNOSIS — Z9181 History of falling: Secondary | ICD-10-CM | POA: Diagnosis not present

## 2016-10-18 DIAGNOSIS — E119 Type 2 diabetes mellitus without complications: Secondary | ICD-10-CM | POA: Diagnosis not present

## 2016-10-18 DIAGNOSIS — Z950 Presence of cardiac pacemaker: Secondary | ICD-10-CM | POA: Diagnosis not present

## 2016-10-18 DIAGNOSIS — Z7901 Long term (current) use of anticoagulants: Secondary | ICD-10-CM | POA: Diagnosis not present

## 2016-10-18 DIAGNOSIS — Z5181 Encounter for therapeutic drug level monitoring: Secondary | ICD-10-CM | POA: Diagnosis not present

## 2016-10-18 DIAGNOSIS — I110 Hypertensive heart disease with heart failure: Secondary | ICD-10-CM | POA: Diagnosis not present

## 2016-10-18 DIAGNOSIS — I509 Heart failure, unspecified: Secondary | ICD-10-CM | POA: Diagnosis not present

## 2016-10-18 DIAGNOSIS — Z96651 Presence of right artificial knee joint: Secondary | ICD-10-CM | POA: Diagnosis not present

## 2016-10-18 DIAGNOSIS — Z794 Long term (current) use of insulin: Secondary | ICD-10-CM | POA: Diagnosis not present

## 2016-10-20 DIAGNOSIS — Z794 Long term (current) use of insulin: Secondary | ICD-10-CM | POA: Diagnosis not present

## 2016-10-20 DIAGNOSIS — Z9181 History of falling: Secondary | ICD-10-CM | POA: Diagnosis not present

## 2016-10-20 DIAGNOSIS — I110 Hypertensive heart disease with heart failure: Secondary | ICD-10-CM | POA: Diagnosis not present

## 2016-10-20 DIAGNOSIS — I509 Heart failure, unspecified: Secondary | ICD-10-CM | POA: Diagnosis not present

## 2016-10-20 DIAGNOSIS — Z96651 Presence of right artificial knee joint: Secondary | ICD-10-CM | POA: Diagnosis not present

## 2016-10-20 DIAGNOSIS — R2689 Other abnormalities of gait and mobility: Secondary | ICD-10-CM | POA: Diagnosis not present

## 2016-10-20 DIAGNOSIS — Z7901 Long term (current) use of anticoagulants: Secondary | ICD-10-CM | POA: Diagnosis not present

## 2016-10-20 DIAGNOSIS — Z5181 Encounter for therapeutic drug level monitoring: Secondary | ICD-10-CM | POA: Diagnosis not present

## 2016-10-20 DIAGNOSIS — E119 Type 2 diabetes mellitus without complications: Secondary | ICD-10-CM | POA: Diagnosis not present

## 2016-10-20 DIAGNOSIS — Z950 Presence of cardiac pacemaker: Secondary | ICD-10-CM | POA: Diagnosis not present

## 2016-10-22 DIAGNOSIS — Z5181 Encounter for therapeutic drug level monitoring: Secondary | ICD-10-CM | POA: Diagnosis not present

## 2016-10-22 DIAGNOSIS — Z794 Long term (current) use of insulin: Secondary | ICD-10-CM | POA: Diagnosis not present

## 2016-10-22 DIAGNOSIS — Z96651 Presence of right artificial knee joint: Secondary | ICD-10-CM | POA: Diagnosis not present

## 2016-10-22 DIAGNOSIS — I509 Heart failure, unspecified: Secondary | ICD-10-CM | POA: Diagnosis not present

## 2016-10-22 DIAGNOSIS — Z7901 Long term (current) use of anticoagulants: Secondary | ICD-10-CM | POA: Diagnosis not present

## 2016-10-22 DIAGNOSIS — E119 Type 2 diabetes mellitus without complications: Secondary | ICD-10-CM | POA: Diagnosis not present

## 2016-10-22 DIAGNOSIS — R2689 Other abnormalities of gait and mobility: Secondary | ICD-10-CM | POA: Diagnosis not present

## 2016-10-22 DIAGNOSIS — I110 Hypertensive heart disease with heart failure: Secondary | ICD-10-CM | POA: Diagnosis not present

## 2016-10-22 DIAGNOSIS — Z9181 History of falling: Secondary | ICD-10-CM | POA: Diagnosis not present

## 2016-10-22 DIAGNOSIS — Z950 Presence of cardiac pacemaker: Secondary | ICD-10-CM | POA: Diagnosis not present

## 2016-10-25 DIAGNOSIS — Z7901 Long term (current) use of anticoagulants: Secondary | ICD-10-CM | POA: Diagnosis not present

## 2016-10-25 DIAGNOSIS — Z5181 Encounter for therapeutic drug level monitoring: Secondary | ICD-10-CM | POA: Diagnosis not present

## 2016-10-25 DIAGNOSIS — I110 Hypertensive heart disease with heart failure: Secondary | ICD-10-CM | POA: Diagnosis not present

## 2016-10-25 DIAGNOSIS — Z9181 History of falling: Secondary | ICD-10-CM | POA: Diagnosis not present

## 2016-10-25 DIAGNOSIS — Z794 Long term (current) use of insulin: Secondary | ICD-10-CM | POA: Diagnosis not present

## 2016-10-25 DIAGNOSIS — Z96651 Presence of right artificial knee joint: Secondary | ICD-10-CM | POA: Diagnosis not present

## 2016-10-25 DIAGNOSIS — I509 Heart failure, unspecified: Secondary | ICD-10-CM | POA: Diagnosis not present

## 2016-10-25 DIAGNOSIS — E119 Type 2 diabetes mellitus without complications: Secondary | ICD-10-CM | POA: Diagnosis not present

## 2016-10-25 DIAGNOSIS — Z950 Presence of cardiac pacemaker: Secondary | ICD-10-CM | POA: Diagnosis not present

## 2016-10-25 DIAGNOSIS — R2689 Other abnormalities of gait and mobility: Secondary | ICD-10-CM | POA: Diagnosis not present

## 2016-10-27 ENCOUNTER — Telehealth: Payer: Self-pay | Admitting: *Deleted

## 2016-10-27 ENCOUNTER — Ambulatory Visit (INDEPENDENT_AMBULATORY_CARE_PROVIDER_SITE_OTHER): Payer: Medicare Other | Admitting: Pharmacist Clinician (PhC)/ Clinical Pharmacy Specialist

## 2016-10-27 DIAGNOSIS — Z5181 Encounter for therapeutic drug level monitoring: Secondary | ICD-10-CM | POA: Diagnosis not present

## 2016-10-27 DIAGNOSIS — I509 Heart failure, unspecified: Secondary | ICD-10-CM | POA: Diagnosis not present

## 2016-10-27 DIAGNOSIS — R2689 Other abnormalities of gait and mobility: Secondary | ICD-10-CM | POA: Diagnosis not present

## 2016-10-27 DIAGNOSIS — Z96651 Presence of right artificial knee joint: Secondary | ICD-10-CM | POA: Diagnosis not present

## 2016-10-27 DIAGNOSIS — Z794 Long term (current) use of insulin: Secondary | ICD-10-CM | POA: Diagnosis not present

## 2016-10-27 DIAGNOSIS — Z950 Presence of cardiac pacemaker: Secondary | ICD-10-CM | POA: Diagnosis not present

## 2016-10-27 DIAGNOSIS — R197 Diarrhea, unspecified: Secondary | ICD-10-CM

## 2016-10-27 DIAGNOSIS — Z7901 Long term (current) use of anticoagulants: Secondary | ICD-10-CM | POA: Diagnosis not present

## 2016-10-27 DIAGNOSIS — Z9181 History of falling: Secondary | ICD-10-CM | POA: Diagnosis not present

## 2016-10-27 DIAGNOSIS — I110 Hypertensive heart disease with heart failure: Secondary | ICD-10-CM | POA: Diagnosis not present

## 2016-10-27 DIAGNOSIS — E119 Type 2 diabetes mellitus without complications: Secondary | ICD-10-CM | POA: Diagnosis not present

## 2016-10-27 LAB — POCT INR: INR: 2.6

## 2016-10-27 MED ORDER — FLUCONAZOLE 150 MG PO TABS: 150.0000 mg | ORAL_TABLET | Freq: Once | ORAL | 0 refills | Status: DC

## 2016-10-27 NOTE — Telephone Encounter (Signed)
Called and spoke w/[name] and asked her about if pt has been running a fever she stated that she has not. She feels that her diarrhea is from the Amoxicillin that she is taking from the surgery. She does take a probiotic. She told me that they have tried using the nystatin and this has not helped with the redness and would like Dr. Madilyn Fireman to send in diflucan to treat this. Also her her blood sugars have been running high;  10/25: 363, 10/26:270 10/28: 197,  10/30: 254  10/31:323.  I asked how she is taking her lantus and she told me that she is taking 20 units BID and that her AM BS are good its just her bedtime BS are elevated.   I told pamela that I thought that it would be best if she got a stool culture and a C-diff since she was having diarrhea. And that I would fwd this note to Dr. Madilyn Fireman for advice on what else should be done as far as tx for the redness.[name], [name]

## 2016-10-27 NOTE — Telephone Encounter (Signed)
OK, rx sent for diflucan.  Try to use a barrier ointment like Desitin.  I agree would recommmend c diff and stool culture test if diarrhea not improving overt next coupld of days.   Increase AM Lantus to 25 units and keep with PM dose of 20 units.

## 2016-10-27 NOTE — Telephone Encounter (Signed)
[name] lvm about pt stating that she is taking Amox 500 mg TID and is having diarrhea and had redness in her perineal area. She stated that her BS have been running high also. She is requesting a script for diflucan be sent to her pharmacy to help with this.[name], [name]

## 2016-10-28 ENCOUNTER — Telehealth: Payer: Self-pay | Admitting: Pharmacist

## 2016-10-28 ENCOUNTER — Encounter: Payer: Self-pay | Admitting: Pharmacist

## 2016-10-28 DIAGNOSIS — E119 Type 2 diabetes mellitus without complications: Secondary | ICD-10-CM | POA: Diagnosis not present

## 2016-10-28 DIAGNOSIS — R2689 Other abnormalities of gait and mobility: Secondary | ICD-10-CM | POA: Diagnosis not present

## 2016-10-28 DIAGNOSIS — Z794 Long term (current) use of insulin: Secondary | ICD-10-CM | POA: Diagnosis not present

## 2016-10-28 DIAGNOSIS — Z9181 History of falling: Secondary | ICD-10-CM | POA: Diagnosis not present

## 2016-10-28 DIAGNOSIS — Z950 Presence of cardiac pacemaker: Secondary | ICD-10-CM | POA: Diagnosis not present

## 2016-10-28 DIAGNOSIS — I509 Heart failure, unspecified: Secondary | ICD-10-CM | POA: Diagnosis not present

## 2016-10-28 DIAGNOSIS — I110 Hypertensive heart disease with heart failure: Secondary | ICD-10-CM | POA: Diagnosis not present

## 2016-10-28 DIAGNOSIS — Z7901 Long term (current) use of anticoagulants: Secondary | ICD-10-CM | POA: Diagnosis not present

## 2016-10-28 DIAGNOSIS — Z5181 Encounter for therapeutic drug level monitoring: Secondary | ICD-10-CM | POA: Diagnosis not present

## 2016-10-28 DIAGNOSIS — Z96651 Presence of right artificial knee joint: Secondary | ICD-10-CM | POA: Diagnosis not present

## 2016-10-28 NOTE — Telephone Encounter (Signed)
Spoke w/[name] and gave recommendations.[name] [place]

## 2016-10-28 NOTE — Progress Notes (Signed)
This encounter was created in error - please disregard.

## 2016-10-28 NOTE — Telephone Encounter (Signed)
Pam, [facility] nurse called because pt took one dose of Diflucan today.   She will get INR at visit next week instead of 2 weeks to be sure that dose did not influence INR.

## 2016-10-29 DIAGNOSIS — Z950 Presence of cardiac pacemaker: Secondary | ICD-10-CM | POA: Diagnosis not present

## 2016-10-29 DIAGNOSIS — Z7901 Long term (current) use of anticoagulants: Secondary | ICD-10-CM | POA: Diagnosis not present

## 2016-10-29 DIAGNOSIS — Z96651 Presence of right artificial knee joint: Secondary | ICD-10-CM | POA: Diagnosis not present

## 2016-10-29 DIAGNOSIS — R2689 Other abnormalities of gait and mobility: Secondary | ICD-10-CM | POA: Diagnosis not present

## 2016-10-29 DIAGNOSIS — I110 Hypertensive heart disease with heart failure: Secondary | ICD-10-CM | POA: Diagnosis not present

## 2016-10-29 DIAGNOSIS — Z5181 Encounter for therapeutic drug level monitoring: Secondary | ICD-10-CM | POA: Diagnosis not present

## 2016-10-29 DIAGNOSIS — Z794 Long term (current) use of insulin: Secondary | ICD-10-CM | POA: Diagnosis not present

## 2016-10-29 DIAGNOSIS — I509 Heart failure, unspecified: Secondary | ICD-10-CM | POA: Diagnosis not present

## 2016-10-29 DIAGNOSIS — E119 Type 2 diabetes mellitus without complications: Secondary | ICD-10-CM | POA: Diagnosis not present

## 2016-10-29 DIAGNOSIS — Z9181 History of falling: Secondary | ICD-10-CM | POA: Diagnosis not present

## 2016-11-03 ENCOUNTER — Ambulatory Visit (INDEPENDENT_AMBULATORY_CARE_PROVIDER_SITE_OTHER): Payer: Medicare Other | Admitting: Pharmacist Clinician (PhC)/ Clinical Pharmacy Specialist

## 2016-11-03 DIAGNOSIS — R2689 Other abnormalities of gait and mobility: Secondary | ICD-10-CM | POA: Diagnosis not present

## 2016-11-03 DIAGNOSIS — Z950 Presence of cardiac pacemaker: Secondary | ICD-10-CM | POA: Diagnosis not present

## 2016-11-03 DIAGNOSIS — Z794 Long term (current) use of insulin: Secondary | ICD-10-CM | POA: Diagnosis not present

## 2016-11-03 DIAGNOSIS — E119 Type 2 diabetes mellitus without complications: Secondary | ICD-10-CM | POA: Diagnosis not present

## 2016-11-03 DIAGNOSIS — Z9181 History of falling: Secondary | ICD-10-CM | POA: Diagnosis not present

## 2016-11-03 DIAGNOSIS — Z96651 Presence of right artificial knee joint: Secondary | ICD-10-CM | POA: Diagnosis not present

## 2016-11-03 DIAGNOSIS — I509 Heart failure, unspecified: Secondary | ICD-10-CM | POA: Diagnosis not present

## 2016-11-03 DIAGNOSIS — I110 Hypertensive heart disease with heart failure: Secondary | ICD-10-CM | POA: Diagnosis not present

## 2016-11-03 DIAGNOSIS — Z5181 Encounter for therapeutic drug level monitoring: Secondary | ICD-10-CM | POA: Diagnosis not present

## 2016-11-03 DIAGNOSIS — Z7901 Long term (current) use of anticoagulants: Secondary | ICD-10-CM | POA: Diagnosis not present

## 2016-11-03 LAB — POCT INR: INR: 5.5

## 2016-11-04 DIAGNOSIS — I509 Heart failure, unspecified: Secondary | ICD-10-CM | POA: Diagnosis not present

## 2016-11-04 DIAGNOSIS — Z950 Presence of cardiac pacemaker: Secondary | ICD-10-CM | POA: Diagnosis not present

## 2016-11-04 DIAGNOSIS — Z5181 Encounter for therapeutic drug level monitoring: Secondary | ICD-10-CM | POA: Diagnosis not present

## 2016-11-04 DIAGNOSIS — I110 Hypertensive heart disease with heart failure: Secondary | ICD-10-CM | POA: Diagnosis not present

## 2016-11-04 DIAGNOSIS — Z794 Long term (current) use of insulin: Secondary | ICD-10-CM | POA: Diagnosis not present

## 2016-11-04 DIAGNOSIS — Z9181 History of falling: Secondary | ICD-10-CM | POA: Diagnosis not present

## 2016-11-04 DIAGNOSIS — Z96651 Presence of right artificial knee joint: Secondary | ICD-10-CM | POA: Diagnosis not present

## 2016-11-04 DIAGNOSIS — E119 Type 2 diabetes mellitus without complications: Secondary | ICD-10-CM | POA: Diagnosis not present

## 2016-11-04 DIAGNOSIS — Z7901 Long term (current) use of anticoagulants: Secondary | ICD-10-CM | POA: Diagnosis not present

## 2016-11-04 DIAGNOSIS — R2689 Other abnormalities of gait and mobility: Secondary | ICD-10-CM | POA: Diagnosis not present

## 2016-11-05 DIAGNOSIS — Z96651 Presence of right artificial knee joint: Secondary | ICD-10-CM | POA: Diagnosis not present

## 2016-11-05 DIAGNOSIS — I110 Hypertensive heart disease with heart failure: Secondary | ICD-10-CM | POA: Diagnosis not present

## 2016-11-05 DIAGNOSIS — Z7901 Long term (current) use of anticoagulants: Secondary | ICD-10-CM | POA: Diagnosis not present

## 2016-11-05 DIAGNOSIS — Z9181 History of falling: Secondary | ICD-10-CM | POA: Diagnosis not present

## 2016-11-05 DIAGNOSIS — Z950 Presence of cardiac pacemaker: Secondary | ICD-10-CM | POA: Diagnosis not present

## 2016-11-05 DIAGNOSIS — Z5181 Encounter for therapeutic drug level monitoring: Secondary | ICD-10-CM | POA: Diagnosis not present

## 2016-11-05 DIAGNOSIS — E119 Type 2 diabetes mellitus without complications: Secondary | ICD-10-CM | POA: Diagnosis not present

## 2016-11-05 DIAGNOSIS — I509 Heart failure, unspecified: Secondary | ICD-10-CM | POA: Diagnosis not present

## 2016-11-05 DIAGNOSIS — R2689 Other abnormalities of gait and mobility: Secondary | ICD-10-CM | POA: Diagnosis not present

## 2016-11-05 DIAGNOSIS — Z794 Long term (current) use of insulin: Secondary | ICD-10-CM | POA: Diagnosis not present

## 2016-11-08 ENCOUNTER — Ambulatory Visit (INDEPENDENT_AMBULATORY_CARE_PROVIDER_SITE_OTHER): Payer: Medicare Other | Admitting: Pharmacist Clinician (PhC)/ Clinical Pharmacy Specialist

## 2016-11-08 DIAGNOSIS — Z9181 History of falling: Secondary | ICD-10-CM | POA: Diagnosis not present

## 2016-11-08 DIAGNOSIS — Z96651 Presence of right artificial knee joint: Secondary | ICD-10-CM | POA: Diagnosis not present

## 2016-11-08 DIAGNOSIS — E119 Type 2 diabetes mellitus without complications: Secondary | ICD-10-CM | POA: Diagnosis not present

## 2016-11-08 DIAGNOSIS — Z950 Presence of cardiac pacemaker: Secondary | ICD-10-CM | POA: Diagnosis not present

## 2016-11-08 DIAGNOSIS — I509 Heart failure, unspecified: Secondary | ICD-10-CM | POA: Diagnosis not present

## 2016-11-08 DIAGNOSIS — Z7901 Long term (current) use of anticoagulants: Secondary | ICD-10-CM | POA: Diagnosis not present

## 2016-11-08 DIAGNOSIS — Z5181 Encounter for therapeutic drug level monitoring: Secondary | ICD-10-CM | POA: Diagnosis not present

## 2016-11-08 DIAGNOSIS — R2689 Other abnormalities of gait and mobility: Secondary | ICD-10-CM | POA: Diagnosis not present

## 2016-11-08 DIAGNOSIS — I110 Hypertensive heart disease with heart failure: Secondary | ICD-10-CM | POA: Diagnosis not present

## 2016-11-08 DIAGNOSIS — Z794 Long term (current) use of insulin: Secondary | ICD-10-CM | POA: Diagnosis not present

## 2016-11-08 LAB — POCT INR: INR: 7.4

## 2016-11-08 LAB — PROTIME-INR: INR: 7.8 — AB (ref <=1.1)

## 2016-11-10 ENCOUNTER — Telehealth: Payer: Self-pay

## 2016-11-10 NOTE — Telephone Encounter (Signed)
Hold coumadin for 2 days.  What is her current dose of coumadin?

## 2016-11-10 NOTE — Telephone Encounter (Signed)
Home health nurse [name] is concerned that pt may have blood in her stool. Her daughter reported to the nurse that mom has been having diarrhea. Nurse reports that her INR was 7.8. Please advise.

## 2016-11-11 ENCOUNTER — Ambulatory Visit (INDEPENDENT_AMBULATORY_CARE_PROVIDER_SITE_OTHER): Payer: Medicare Other | Admitting: Pharmacist Clinician (PhC)/ Clinical Pharmacy Specialist

## 2016-11-11 ENCOUNTER — Telehealth: Payer: Self-pay | Admitting: Family Medicine

## 2016-11-11 DIAGNOSIS — Z950 Presence of cardiac pacemaker: Secondary | ICD-10-CM | POA: Diagnosis not present

## 2016-11-11 DIAGNOSIS — Z5181 Encounter for therapeutic drug level monitoring: Secondary | ICD-10-CM | POA: Diagnosis not present

## 2016-11-11 DIAGNOSIS — I509 Heart failure, unspecified: Secondary | ICD-10-CM | POA: Diagnosis not present

## 2016-11-11 DIAGNOSIS — R2689 Other abnormalities of gait and mobility: Secondary | ICD-10-CM | POA: Diagnosis not present

## 2016-11-11 DIAGNOSIS — Z96651 Presence of right artificial knee joint: Secondary | ICD-10-CM | POA: Diagnosis not present

## 2016-11-11 DIAGNOSIS — E119 Type 2 diabetes mellitus without complications: Secondary | ICD-10-CM | POA: Diagnosis not present

## 2016-11-11 DIAGNOSIS — Z794 Long term (current) use of insulin: Secondary | ICD-10-CM | POA: Diagnosis not present

## 2016-11-11 DIAGNOSIS — Z9181 History of falling: Secondary | ICD-10-CM | POA: Diagnosis not present

## 2016-11-11 DIAGNOSIS — I110 Hypertensive heart disease with heart failure: Secondary | ICD-10-CM | POA: Diagnosis not present

## 2016-11-11 DIAGNOSIS — Z7901 Long term (current) use of anticoagulants: Secondary | ICD-10-CM | POA: Diagnosis not present

## 2016-11-11 LAB — POCT INR: INR: 3.3

## 2016-11-11 NOTE — Telephone Encounter (Signed)
Received call from [name] [phone]) with [facility]. Would like verbal for the following: -Weekly nurse visit with option for PRN visits -Twice a week Nurse Aid visits

## 2016-11-11 NOTE — Telephone Encounter (Signed)
Patient advised of recommendations.  

## 2016-11-12 DIAGNOSIS — I482 Chronic atrial fibrillation: Secondary | ICD-10-CM | POA: Diagnosis not present

## 2016-11-12 DIAGNOSIS — B952 Enterococcus as the cause of diseases classified elsewhere: Secondary | ICD-10-CM | POA: Diagnosis not present

## 2016-11-12 DIAGNOSIS — T360X5D Adverse effect of penicillins, subsequent encounter: Secondary | ICD-10-CM | POA: Diagnosis not present

## 2016-11-12 DIAGNOSIS — T8451XD Infection and inflammatory reaction due to internal right hip prosthesis, subsequent encounter: Secondary | ICD-10-CM | POA: Diagnosis not present

## 2016-11-12 DIAGNOSIS — T847XXD Infection and inflammatory reaction due to other internal orthopedic prosthetic devices, implants and grafts, subsequent encounter: Secondary | ICD-10-CM | POA: Diagnosis not present

## 2016-11-12 DIAGNOSIS — A0472 Enterocolitis due to Clostridium difficile, not specified as recurrent: Secondary | ICD-10-CM | POA: Diagnosis not present

## 2016-11-12 DIAGNOSIS — E119 Type 2 diabetes mellitus without complications: Secondary | ICD-10-CM | POA: Diagnosis not present

## 2016-11-12 DIAGNOSIS — K521 Toxic gastroenteritis and colitis: Secondary | ICD-10-CM | POA: Diagnosis not present

## 2016-11-12 DIAGNOSIS — I10 Essential (primary) hypertension: Secondary | ICD-10-CM | POA: Diagnosis not present

## 2016-11-12 DIAGNOSIS — T8469XD Infection and inflammatory reaction due to internal fixation device of other site, subsequent encounter: Secondary | ICD-10-CM | POA: Diagnosis not present

## 2016-11-12 NOTE — Telephone Encounter (Signed)
Okay to improve verbal order.

## 2016-11-12 NOTE — Telephone Encounter (Signed)
[name] notified

## 2016-11-15 DIAGNOSIS — Z794 Long term (current) use of insulin: Secondary | ICD-10-CM | POA: Diagnosis not present

## 2016-11-15 DIAGNOSIS — Z5181 Encounter for therapeutic drug level monitoring: Secondary | ICD-10-CM | POA: Diagnosis not present

## 2016-11-15 DIAGNOSIS — I509 Heart failure, unspecified: Secondary | ICD-10-CM | POA: Diagnosis not present

## 2016-11-15 DIAGNOSIS — Z950 Presence of cardiac pacemaker: Secondary | ICD-10-CM | POA: Diagnosis not present

## 2016-11-15 DIAGNOSIS — Z9181 History of falling: Secondary | ICD-10-CM | POA: Diagnosis not present

## 2016-11-15 DIAGNOSIS — Z96651 Presence of right artificial knee joint: Secondary | ICD-10-CM | POA: Diagnosis not present

## 2016-11-15 DIAGNOSIS — E119 Type 2 diabetes mellitus without complications: Secondary | ICD-10-CM | POA: Diagnosis not present

## 2016-11-15 DIAGNOSIS — I110 Hypertensive heart disease with heart failure: Secondary | ICD-10-CM | POA: Diagnosis not present

## 2016-11-15 DIAGNOSIS — Z7901 Long term (current) use of anticoagulants: Secondary | ICD-10-CM | POA: Diagnosis not present

## 2016-11-15 DIAGNOSIS — R2689 Other abnormalities of gait and mobility: Secondary | ICD-10-CM | POA: Diagnosis not present

## 2016-11-17 ENCOUNTER — Ambulatory Visit (INDEPENDENT_AMBULATORY_CARE_PROVIDER_SITE_OTHER): Payer: Medicare Other | Admitting: Pharmacist Clinician (PhC)/ Clinical Pharmacy Specialist

## 2016-11-17 DIAGNOSIS — E119 Type 2 diabetes mellitus without complications: Secondary | ICD-10-CM | POA: Diagnosis not present

## 2016-11-17 DIAGNOSIS — Z7901 Long term (current) use of anticoagulants: Secondary | ICD-10-CM | POA: Diagnosis not present

## 2016-11-17 DIAGNOSIS — Z794 Long term (current) use of insulin: Secondary | ICD-10-CM | POA: Diagnosis not present

## 2016-11-17 DIAGNOSIS — I110 Hypertensive heart disease with heart failure: Secondary | ICD-10-CM | POA: Diagnosis not present

## 2016-11-17 DIAGNOSIS — R2689 Other abnormalities of gait and mobility: Secondary | ICD-10-CM | POA: Diagnosis not present

## 2016-11-17 DIAGNOSIS — I509 Heart failure, unspecified: Secondary | ICD-10-CM | POA: Diagnosis not present

## 2016-11-17 DIAGNOSIS — Z950 Presence of cardiac pacemaker: Secondary | ICD-10-CM | POA: Diagnosis not present

## 2016-11-17 DIAGNOSIS — Z9181 History of falling: Secondary | ICD-10-CM | POA: Diagnosis not present

## 2016-11-17 DIAGNOSIS — Z5181 Encounter for therapeutic drug level monitoring: Secondary | ICD-10-CM | POA: Diagnosis not present

## 2016-11-17 DIAGNOSIS — Z96651 Presence of right artificial knee joint: Secondary | ICD-10-CM | POA: Diagnosis not present

## 2016-11-17 LAB — POCT INR: INR: 2.7

## 2016-11-19 DIAGNOSIS — I110 Hypertensive heart disease with heart failure: Secondary | ICD-10-CM | POA: Diagnosis not present

## 2016-11-19 DIAGNOSIS — Z96651 Presence of right artificial knee joint: Secondary | ICD-10-CM | POA: Diagnosis not present

## 2016-11-19 DIAGNOSIS — Z5181 Encounter for therapeutic drug level monitoring: Secondary | ICD-10-CM | POA: Diagnosis not present

## 2016-11-19 DIAGNOSIS — Z9181 History of falling: Secondary | ICD-10-CM | POA: Diagnosis not present

## 2016-11-19 DIAGNOSIS — Z7901 Long term (current) use of anticoagulants: Secondary | ICD-10-CM | POA: Diagnosis not present

## 2016-11-19 DIAGNOSIS — R2689 Other abnormalities of gait and mobility: Secondary | ICD-10-CM | POA: Diagnosis not present

## 2016-11-19 DIAGNOSIS — E119 Type 2 diabetes mellitus without complications: Secondary | ICD-10-CM | POA: Diagnosis not present

## 2016-11-19 DIAGNOSIS — Z794 Long term (current) use of insulin: Secondary | ICD-10-CM | POA: Diagnosis not present

## 2016-11-19 DIAGNOSIS — I509 Heart failure, unspecified: Secondary | ICD-10-CM | POA: Diagnosis not present

## 2016-11-19 DIAGNOSIS — Z950 Presence of cardiac pacemaker: Secondary | ICD-10-CM | POA: Diagnosis not present

## 2016-11-22 DIAGNOSIS — Z5181 Encounter for therapeutic drug level monitoring: Secondary | ICD-10-CM | POA: Diagnosis not present

## 2016-11-22 DIAGNOSIS — E119 Type 2 diabetes mellitus without complications: Secondary | ICD-10-CM | POA: Diagnosis not present

## 2016-11-22 DIAGNOSIS — Z794 Long term (current) use of insulin: Secondary | ICD-10-CM | POA: Diagnosis not present

## 2016-11-22 DIAGNOSIS — I509 Heart failure, unspecified: Secondary | ICD-10-CM | POA: Diagnosis not present

## 2016-11-22 DIAGNOSIS — Z96651 Presence of right artificial knee joint: Secondary | ICD-10-CM | POA: Diagnosis not present

## 2016-11-22 DIAGNOSIS — Z9181 History of falling: Secondary | ICD-10-CM | POA: Diagnosis not present

## 2016-11-22 DIAGNOSIS — I110 Hypertensive heart disease with heart failure: Secondary | ICD-10-CM | POA: Diagnosis not present

## 2016-11-22 DIAGNOSIS — Z950 Presence of cardiac pacemaker: Secondary | ICD-10-CM | POA: Diagnosis not present

## 2016-11-22 DIAGNOSIS — R2689 Other abnormalities of gait and mobility: Secondary | ICD-10-CM | POA: Diagnosis not present

## 2016-11-22 DIAGNOSIS — Z7901 Long term (current) use of anticoagulants: Secondary | ICD-10-CM | POA: Diagnosis not present

## 2016-11-24 ENCOUNTER — Encounter: Payer: Self-pay | Admitting: Family Medicine

## 2016-11-24 ENCOUNTER — Telehealth: Payer: Self-pay | Admitting: *Deleted

## 2016-11-24 ENCOUNTER — Ambulatory Visit (INDEPENDENT_AMBULATORY_CARE_PROVIDER_SITE_OTHER): Payer: Medicare Other | Admitting: Family Medicine

## 2016-11-24 VITALS — BP 140/51 | HR 70 | Wt 169.0 lb

## 2016-11-24 DIAGNOSIS — E1143 Type 2 diabetes mellitus with diabetic autonomic (poly)neuropathy: Secondary | ICD-10-CM | POA: Diagnosis not present

## 2016-11-24 DIAGNOSIS — M542 Cervicalgia: Secondary | ICD-10-CM | POA: Diagnosis not present

## 2016-11-24 DIAGNOSIS — K529 Noninfective gastroenteritis and colitis, unspecified: Secondary | ICD-10-CM

## 2016-11-24 DIAGNOSIS — G8929 Other chronic pain: Secondary | ICD-10-CM | POA: Insufficient documentation

## 2016-11-24 DIAGNOSIS — Z794 Long term (current) use of insulin: Secondary | ICD-10-CM | POA: Diagnosis not present

## 2016-11-24 LAB — POCT GLYCOSYLATED HEMOGLOBIN (HGB A1C): Hemoglobin A1C: 6.9

## 2016-11-24 MED ORDER — OXYCODONE-ACETAMINOPHEN 5-325 MG PO TABS: 1.0000 | ORAL_TABLET | Freq: Two times a day (BID) | ORAL | 0 refills | Status: DC | PRN

## 2016-11-24 MED ORDER — CHOLESTYRAMINE LIGHT 4 GM/DOSE PO POWD: 2.0000 g | Freq: Two times a day (BID) | ORAL | 12 refills | Status: DC

## 2016-11-24 NOTE — Progress Notes (Signed)
Subjective:    CC: DM  HPI: Diabetes - no hypoglycemic events. No wounds or sores that are not healing well. No increased thirst or urination. Checking glucose at home. Taking medications as prescribed without any side effects.Her highest glucose has been 380 which is good for her because in the past she was reaching 500. She said she's actually been seeing glucoses between 80-140,  recently which is fantastic.  When her biggest concerns is chronic diarrhea. 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 They did switch her recently from amoxicillin to penicillin it has helped some but she still really struggling.She has tried Imodium but when she takes it actually stops her up for almost 3 days  Chronic pain management-her orthopedist has released her in regards to chronic pain medicine. He is still following her for the chronic infection. Her only choice was for them to go in and remove the hardware HTN for 6 weeks better on IV antibiotic and then replace it with new hardware. She did not want to undergo that again so she is going to be on chronic antibiotics probably for lifelong. She was recently on amoxicillin but they did switch it to penicillin a couple weeks ago because of the persistent diarrhea. He wants me as her primary care doctors take back over her chronic pain management which I was doing previously. Before she was taking Percocet and responded well to it. She would like to get back on that.  BP (!) 140/51   Pulse 70   Wt 169 lb (76.7 kg)   BMI 26.47 kg/m     Allergies  Allergen Reactions  . Ace Inhibitors     REACTION: cough  . Angiotensin Receptor Blockers Other (See Comments)    Renal dx.  Nephrology has recommended against use bc of hyperkalemia  . Fosinopril Sodium     REACTION: Cough  . Glipizide Other (See Comments)    Hypoglycemia  .  Sulfamethoxazole-Trimethoprim     REACTION: Unspecified    Past Medical History:  Diagnosis Date  . Anemia    Pernicious  . Atrial fibrillation ([place]) 1/11    Dr Gwenlyn Found  . Back pain   . Chronic anticoagulation    on coumadin  . Chronic kidney disease    chronic, stage III  . Complete heart block ([place]) 01/31/2015  . Cystitis   . Diabetes mellitus    w/neuro manifestations, type II  . Diarrhea   . Esophageal stricture   . GERD (gastroesophageal reflux disease)   . H/O cardiac catheterization 2010   normal coronary arteries, EF 60%  . Heart failure    chronic diatolic- 2 D echo [id number] EF 60%  . Heart murmur   . Hiatal hernia   . History of kidney stones   . Hx of echocardiogram 04/04/13   EF 55-60%, mild MR  . Hyperlipidemia   . Hypertension   . IBS (irritable bowel syndrome)   . Memory loss   . Microalbuminuria   . Non-stress test nonreactive 11/14   Lexiscan myoview negative for ischemia  . Osteoarthrosis involving multiple sites   . PAF (paroxysmal atrial fibrillation) ([place])    Last DCCV 12/29/2009  . Pancreatitis   . Peptic ulcer    unspec. w/o obstruction  . RBBB   . Senile osteoporosis   . Swelling    limb, left more than right ankle   .  Symptomatic bradycardia 01/2010   CHB, temp pacer and then Medtronic PPM  . Thyroid disease   . Tubular adenoma of colon   . Ulcer ([place])    Gastric    Past Surgical History:  Procedure Laterality Date  . APPENDECTOMY    . CARDIAC CATHETERIZATION  2010   normal coronary arteries  . carotid dopplers- no significant stenosis  2007  . CATARACT EXTRACTION     both eyes  . EGD- esophageal stricture     gastric ulcer  . pacemaker placement  2/11   medtronic dual chamber  . TONSILLECTOMY AND ADENOIDECTOMY      Social History   Social History  . Marital status: Widowed    Spouse name: N/A  . Number of children: N/A  . Years of education: N/A   Occupational History  . Not on file.   Social History Main Topics  .  Smoking status: Never Smoker  . Smokeless tobacco: Not on file  . Alcohol use No  . Drug use: No  . Sexual activity: No   Other Topics Concern  . Not on file   Social History Narrative  . No narrative on file    Family History  Problem Relation Age of Onset  . Diabetes Mother   . Prostate cancer Father   . Heart attack Father   . Colon cancer Sister   . Esophageal cancer Neg Hx   . Rectal cancer Neg Hx   . Stomach cancer Neg Hx   . Alzheimer's disease Sister   . Heart disease Brother     Outpatient Encounter Prescriptions as of 11/24/2016  Medication Sig  . AMBULATORY NON FORMULARY MEDICATION Please obtain a urinalysis along with culture and sensitivities. Dx: F05 acute confusional state Z87.440 history of Uti's  . amoxicillin (AMOXIL) 250 MG capsule Take 250 mg by mouth 3 (three) times daily.  Marland Kitchen ascorbic acid (VITAMIN C) 500 MG tablet Take 500 mg by mouth 3 (three) times daily with meals.  Marland Kitchen b complex vitamins capsule Take 1 capsule by mouth daily.  . calcium citrate (CALCITRATE - DOSED IN MG ELEMENTAL CALCIUM) 950 MG tablet Take 950 mg by mouth daily.  . clotrimazole (LOTRIMIN) 1 % cream Apply 1 application topically 2 (two) times daily. Apply to affected areas twice daily  . gabapentin (NEURONTIN) 600 MG tablet Take 600 mg by mouth at bedtime.  Marland Kitchen glucose blood (ACCU-CHEK COMPACT PLUS) test strip Check blood sugars 3 times daily Dx: E11.9  . Insulin Syringe-Needle U-100 (EASY COMFORT INSULIN SYRINGE) 30G X 5/16" 0.5 ML MISC by Does not apply route as directed. Reported on 04/15/2016  . LANTUS SOLOSTAR 100 UNIT/ML Solostar Pen INJECT 28 UNITS INTO THE SKIN 2 (TWO) TIMES DAILY.  Marland Kitchen levothyroxine (SYNTHROID, LEVOTHROID) 100 MCG tablet TAKE ONE TABLET BY MOUTH DAILY  . memantine (NAMENDA) 5 MG tablet TAKE 1 TABLET BY MOUTH AT BEDTIME  . metoprolol (LOPRESSOR) 50 MG tablet Half tablet twice a day  . Multiple Vitamins-Minerals (CERTAVITE SENIOR/ANTIOXIDANT) TABS Take 1 tablet by  mouth daily.  . NYSTATIN powder APPLY TOPICALLY THREE TIMES A DAY  . oxyCODONE (OXY IR/ROXICODONE) 5 MG immediate release tablet 1 by mouth every 4 hours as needed from as needed for mild to moderate pain, 2 by mouth every 4 hours as needed for severepain  . pantoprazole (PROTONIX) 40 MG tablet TAKE 1 TABLET (40 MG TOTAL) BY MOUTH DAILY.  . pravastatin (PRAVACHOL) 40 MG tablet TAKE ONE TABLET BY MOUTH NIGHTLY AT BEDTIME  .  zinc oxide 20 % ointment Apply 1 application topically 2 (two) times daily as needed for irritation (to external rectum and buttocks).  . cholestyramine light (PREVALITE) 4 GM/DOSE powder Take 0.5 packets (2 g total) by mouth 2 (two) times daily with a meal.  . iron polysaccharides (NIFEREX) 150 MG capsule Take 150 mg by mouth daily.  Marland Kitchen losartan (COZAAR) 25 MG tablet Take 25 mg by mouth daily.  Marland Kitchen oxyCODONE-acetaminophen (PERCOCET) 5-325 MG tablet Take 1 tablet by mouth 2 (two) times daily as needed for severe pain.  . [DISCONTINUED] amoxicillin (AMOXIL) 500 MG capsule   . [DISCONTINUED] collagenase (SANTYL) ointment Apply 1 application topically daily.  . [DISCONTINUED] GLIPIZIDE XL 5 MG 24 hr tablet TAKE 1 TABLET (5 MG TOTAL) BY MOUTH DAILY WITH BREAKFAST. (Patient not taking: Reported on 11/24/2016)  . [DISCONTINUED] ipratropium-albuterol (DUONEB) 0.5-2.5 (3) MG/3ML SOLN Take 3 mLs by nebulization 2 (two) times daily.   No facility-administered encounter medications on file as of 11/24/2016.    .   Objective:    General: Well Developed, well nourished, and in no acute distress.  Neuro: Alert and oriented x3, extra-ocular muscles intact, sensation grossly intact.  HEENT: Normocephalic, atraumatic  Skin: Warm and dry, no rashes. Cardiac: Regular rate and rhythm, no murmurs rubs or gallops, no lower extremity edema.  Respiratory: Clear to auscultation bilaterally. Not using accessory muscles, speaking in full sentences.   Impression and Recommendations:    DM- A1c is  at goal at 6.9 up from previous. That we did recently stop the glipizide because of hypoglycemic episodes and switch her to Januvia.We'll try to get her samples because she has had her Medicare gap and cannot afford to refill it right now. We did give her a sample of Toujeo today as well.  Chronic diarrhea - she will likely be on lifelong antibiotic so we need to find something to control the diarrhea.  Will try Questran.    Chronic pain management for right leg pain and neck pain- We got her an updated pain contract today. We'll perform urine drug screen. Refilled her Percocet today for 60 tabs. I did increase the quantity to 60,  from 45. Continue to try to use sparingly. Follow-up in 2 months.

## 2016-11-24 NOTE — Telephone Encounter (Signed)
Spoke w/pt's daughter and asked if she could speak with the [facility] [name] to have her to draw a BMP w/GFR to check her kidneys. She stated that she would send her a text and ask her to do this.[name] [place]

## 2016-11-25 ENCOUNTER — Ambulatory Visit: Payer: Self-pay | Admitting: Family Medicine

## 2016-11-25 ENCOUNTER — Telehealth: Payer: Self-pay

## 2016-11-25 DIAGNOSIS — I509 Heart failure, unspecified: Secondary | ICD-10-CM | POA: Diagnosis not present

## 2016-11-25 DIAGNOSIS — Z794 Long term (current) use of insulin: Secondary | ICD-10-CM | POA: Diagnosis not present

## 2016-11-25 DIAGNOSIS — Z950 Presence of cardiac pacemaker: Secondary | ICD-10-CM | POA: Diagnosis not present

## 2016-11-25 DIAGNOSIS — I110 Hypertensive heart disease with heart failure: Secondary | ICD-10-CM | POA: Diagnosis not present

## 2016-11-25 DIAGNOSIS — E119 Type 2 diabetes mellitus without complications: Secondary | ICD-10-CM | POA: Diagnosis not present

## 2016-11-25 DIAGNOSIS — Z5181 Encounter for therapeutic drug level monitoring: Secondary | ICD-10-CM | POA: Diagnosis not present

## 2016-11-25 DIAGNOSIS — Z7901 Long term (current) use of anticoagulants: Secondary | ICD-10-CM | POA: Diagnosis not present

## 2016-11-25 DIAGNOSIS — Z96651 Presence of right artificial knee joint: Secondary | ICD-10-CM | POA: Diagnosis not present

## 2016-11-25 DIAGNOSIS — Z9181 History of falling: Secondary | ICD-10-CM | POA: Diagnosis not present

## 2016-11-25 DIAGNOSIS — R2689 Other abnormalities of gait and mobility: Secondary | ICD-10-CM | POA: Diagnosis not present

## 2016-11-25 LAB — BASIC METABOLIC PANEL
BUN: 16 mg/dL (ref 4–21)
CREATININE: 1.1 mg/dL (ref 0.5–1.1)
Glucose: 188 mg/dL
POTASSIUM: 4.3 mmol/L (ref 3.4–5.3)
SODIUM: 143 mmol/L (ref 137–147)

## 2016-11-25 LAB — HEPATIC FUNCTION PANEL
ALK PHOS: 77 U/L (ref 25–125)
ALT: 16 U/L (ref 7–35)
AST: 26 U/L (ref 13–35)
Bilirubin, Total: 0.4 mg/dL

## 2016-11-25 LAB — PROTIME-INR: INR: 1.4 — AB (ref 0.9–1.1)

## 2016-11-25 NOTE — Telephone Encounter (Signed)
[name] [phone]) home health nurse reports that [name] INR was 1.4.

## 2016-11-25 NOTE — Telephone Encounter (Signed)
Ok to increase to 5mg  on Sun, Tue, Thur, Sat.  2.5 mg all other days. Recheck INR in 2 weeks.

## 2016-11-25 NOTE — Telephone Encounter (Signed)
Pt confirmed taking 5 mg on Sundays, Tuesdays, Thursday, and 2.5 mg all other days.

## 2016-11-25 NOTE — Telephone Encounter (Signed)
Please verify coumadin dose so that I can increase her dose.

## 2016-11-26 DIAGNOSIS — I110 Hypertensive heart disease with heart failure: Secondary | ICD-10-CM | POA: Diagnosis not present

## 2016-11-26 DIAGNOSIS — Z5181 Encounter for therapeutic drug level monitoring: Secondary | ICD-10-CM | POA: Diagnosis not present

## 2016-11-26 DIAGNOSIS — Z96651 Presence of right artificial knee joint: Secondary | ICD-10-CM | POA: Diagnosis not present

## 2016-11-26 DIAGNOSIS — E119 Type 2 diabetes mellitus without complications: Secondary | ICD-10-CM | POA: Diagnosis not present

## 2016-11-26 DIAGNOSIS — Z7901 Long term (current) use of anticoagulants: Secondary | ICD-10-CM | POA: Diagnosis not present

## 2016-11-26 DIAGNOSIS — Z794 Long term (current) use of insulin: Secondary | ICD-10-CM | POA: Diagnosis not present

## 2016-11-26 DIAGNOSIS — I509 Heart failure, unspecified: Secondary | ICD-10-CM | POA: Diagnosis not present

## 2016-11-26 DIAGNOSIS — Z950 Presence of cardiac pacemaker: Secondary | ICD-10-CM | POA: Diagnosis not present

## 2016-11-26 DIAGNOSIS — R2689 Other abnormalities of gait and mobility: Secondary | ICD-10-CM | POA: Diagnosis not present

## 2016-11-26 DIAGNOSIS — Z9181 History of falling: Secondary | ICD-10-CM | POA: Diagnosis not present

## 2016-11-26 LAB — DRUG ABUSE PANEL 10-50 NO CONF, U
AMPHETAMINES (1000 ng/mL SCRN): NEGATIVE
BARBITURATES: NEGATIVE
BENZODIAZEPINES: NEGATIVE
COCAINE METABOLITES: NEGATIVE
MARIJUANA MET (50 NG/ML SCRN): NEGATIVE
METHADONE: NEGATIVE
METHAQUALONE: NEGATIVE
OPIATES: NEGATIVE
PHENCYCLIDINE: NEGATIVE
PROPOXYPHENE: NEGATIVE

## 2016-11-26 NOTE — Telephone Encounter (Signed)
Pt returned clinic call, advised of Rx dosage change. She will contact clinic to schedule follow up.

## 2016-11-26 NOTE — Telephone Encounter (Signed)
Vm not set up

## 2016-11-29 ENCOUNTER — Ambulatory Visit (INDEPENDENT_AMBULATORY_CARE_PROVIDER_SITE_OTHER): Payer: Medicare Other | Admitting: Pharmacist Clinician (PhC)/ Clinical Pharmacy Specialist

## 2016-11-29 ENCOUNTER — Telehealth: Payer: Self-pay | Admitting: *Deleted

## 2016-11-29 DIAGNOSIS — E119 Type 2 diabetes mellitus without complications: Secondary | ICD-10-CM | POA: Diagnosis not present

## 2016-11-29 DIAGNOSIS — Z794 Long term (current) use of insulin: Secondary | ICD-10-CM | POA: Diagnosis not present

## 2016-11-29 DIAGNOSIS — Z9181 History of falling: Secondary | ICD-10-CM | POA: Diagnosis not present

## 2016-11-29 DIAGNOSIS — R2689 Other abnormalities of gait and mobility: Secondary | ICD-10-CM | POA: Diagnosis not present

## 2016-11-29 DIAGNOSIS — Z7901 Long term (current) use of anticoagulants: Secondary | ICD-10-CM | POA: Diagnosis not present

## 2016-11-29 DIAGNOSIS — Z950 Presence of cardiac pacemaker: Secondary | ICD-10-CM | POA: Diagnosis not present

## 2016-11-29 DIAGNOSIS — I509 Heart failure, unspecified: Secondary | ICD-10-CM | POA: Diagnosis not present

## 2016-11-29 DIAGNOSIS — Z5181 Encounter for therapeutic drug level monitoring: Secondary | ICD-10-CM | POA: Diagnosis not present

## 2016-11-29 DIAGNOSIS — I110 Hypertensive heart disease with heart failure: Secondary | ICD-10-CM | POA: Diagnosis not present

## 2016-11-29 DIAGNOSIS — Z96651 Presence of right artificial knee joint: Secondary | ICD-10-CM | POA: Diagnosis not present

## 2016-11-29 NOTE — Telephone Encounter (Signed)
lvm informing pt's daughter that medication is up front for p/u.[name]

## 2016-11-30 ENCOUNTER — Telehealth: Payer: Self-pay | Admitting: *Deleted

## 2016-11-30 DIAGNOSIS — Z7901 Long term (current) use of anticoagulants: Secondary | ICD-10-CM | POA: Diagnosis not present

## 2016-11-30 DIAGNOSIS — R2689 Other abnormalities of gait and mobility: Secondary | ICD-10-CM | POA: Diagnosis not present

## 2016-11-30 DIAGNOSIS — Z96651 Presence of right artificial knee joint: Secondary | ICD-10-CM | POA: Diagnosis not present

## 2016-11-30 DIAGNOSIS — Z5181 Encounter for therapeutic drug level monitoring: Secondary | ICD-10-CM | POA: Diagnosis not present

## 2016-11-30 DIAGNOSIS — Z950 Presence of cardiac pacemaker: Secondary | ICD-10-CM | POA: Diagnosis not present

## 2016-11-30 DIAGNOSIS — I509 Heart failure, unspecified: Secondary | ICD-10-CM | POA: Diagnosis not present

## 2016-11-30 DIAGNOSIS — Z9181 History of falling: Secondary | ICD-10-CM | POA: Diagnosis not present

## 2016-11-30 DIAGNOSIS — E119 Type 2 diabetes mellitus without complications: Secondary | ICD-10-CM | POA: Diagnosis not present

## 2016-11-30 DIAGNOSIS — Z794 Long term (current) use of insulin: Secondary | ICD-10-CM | POA: Diagnosis not present

## 2016-11-30 DIAGNOSIS — I110 Hypertensive heart disease with heart failure: Secondary | ICD-10-CM | POA: Diagnosis not present

## 2016-11-30 LAB — POCT INR
INR: 1.3
INR: 1.3

## 2016-11-30 NOTE — Telephone Encounter (Signed)
[name] called and wanted to inform of pt's BS:  11/27/16: 553  11/28/16: 249 11/29/16: 248 She stated that pt is taking Tuojeo 20 units BID. Will fwd to pcp for advice.Marland Kitchen  Marland KitchenAudelia Hives [place]

## 2016-12-01 DIAGNOSIS — Z7901 Long term (current) use of anticoagulants: Secondary | ICD-10-CM | POA: Diagnosis not present

## 2016-12-01 DIAGNOSIS — I110 Hypertensive heart disease with heart failure: Secondary | ICD-10-CM | POA: Diagnosis not present

## 2016-12-01 DIAGNOSIS — Z9181 History of falling: Secondary | ICD-10-CM | POA: Diagnosis not present

## 2016-12-01 DIAGNOSIS — Z96651 Presence of right artificial knee joint: Secondary | ICD-10-CM | POA: Diagnosis not present

## 2016-12-01 DIAGNOSIS — Z5181 Encounter for therapeutic drug level monitoring: Secondary | ICD-10-CM | POA: Diagnosis not present

## 2016-12-01 DIAGNOSIS — I509 Heart failure, unspecified: Secondary | ICD-10-CM | POA: Diagnosis not present

## 2016-12-01 DIAGNOSIS — E119 Type 2 diabetes mellitus without complications: Secondary | ICD-10-CM | POA: Diagnosis not present

## 2016-12-01 DIAGNOSIS — Z950 Presence of cardiac pacemaker: Secondary | ICD-10-CM | POA: Diagnosis not present

## 2016-12-01 DIAGNOSIS — Z794 Long term (current) use of insulin: Secondary | ICD-10-CM | POA: Diagnosis not present

## 2016-12-01 DIAGNOSIS — R2689 Other abnormalities of gait and mobility: Secondary | ICD-10-CM | POA: Diagnosis not present

## 2016-12-01 NOTE — Telephone Encounter (Signed)
Increase to 22 units BID.

## 2016-12-01 NOTE — Telephone Encounter (Signed)
lvm w/recommendations.[name] [place]  

## 2016-12-02 ENCOUNTER — Other Ambulatory Visit: Payer: Self-pay | Admitting: *Deleted

## 2016-12-02 MED ORDER — CHOLESTYRAMINE LIGHT 4 GM/DOSE PO POWD: 2.0000 g | Freq: Two times a day (BID) | ORAL | 12 refills | Status: DC

## 2016-12-03 ENCOUNTER — Telehealth: Payer: Self-pay

## 2016-12-03 NOTE — Telephone Encounter (Signed)
Daughter reports that mother fell bumped her head and cut her shin. She is complaining of no vision in left eye. She asked her mom to go to the ED she declined. She has appointment on Monday.

## 2016-12-03 NOTE — Telephone Encounter (Signed)
Please call pt and see if we can convince her to go to the ED.

## 2016-12-06 ENCOUNTER — Encounter: Payer: Self-pay | Admitting: Family Medicine

## 2016-12-06 ENCOUNTER — Ambulatory Visit (INDEPENDENT_AMBULATORY_CARE_PROVIDER_SITE_OTHER): Payer: Medicare Other | Admitting: Family Medicine

## 2016-12-06 ENCOUNTER — Ambulatory Visit (INDEPENDENT_AMBULATORY_CARE_PROVIDER_SITE_OTHER): Payer: Medicare Other

## 2016-12-06 VITALS — BP 136/73 | HR 65 | Resp 98

## 2016-12-06 DIAGNOSIS — S92411A Displaced fracture of proximal phalanx of right great toe, initial encounter for closed fracture: Secondary | ICD-10-CM | POA: Diagnosis not present

## 2016-12-06 DIAGNOSIS — S0511XA Contusion of eyeball and orbital tissues, right eye, initial encounter: Secondary | ICD-10-CM

## 2016-12-06 DIAGNOSIS — R55 Syncope and collapse: Secondary | ICD-10-CM | POA: Diagnosis not present

## 2016-12-06 DIAGNOSIS — W19XXXA Unspecified fall, initial encounter: Secondary | ICD-10-CM

## 2016-12-06 DIAGNOSIS — Z7901 Long term (current) use of anticoagulants: Secondary | ICD-10-CM | POA: Diagnosis not present

## 2016-12-06 DIAGNOSIS — I110 Hypertensive heart disease with heart failure: Secondary | ICD-10-CM | POA: Diagnosis not present

## 2016-12-06 DIAGNOSIS — R2689 Other abnormalities of gait and mobility: Secondary | ICD-10-CM | POA: Diagnosis not present

## 2016-12-06 DIAGNOSIS — M79674 Pain in right toe(s): Secondary | ICD-10-CM | POA: Diagnosis not present

## 2016-12-06 DIAGNOSIS — Z96651 Presence of right artificial knee joint: Secondary | ICD-10-CM | POA: Diagnosis not present

## 2016-12-06 DIAGNOSIS — E119 Type 2 diabetes mellitus without complications: Secondary | ICD-10-CM | POA: Diagnosis not present

## 2016-12-06 DIAGNOSIS — S92424A Nondisplaced fracture of distal phalanx of right great toe, initial encounter for closed fracture: Secondary | ICD-10-CM | POA: Diagnosis not present

## 2016-12-06 DIAGNOSIS — Z950 Presence of cardiac pacemaker: Secondary | ICD-10-CM | POA: Diagnosis not present

## 2016-12-06 DIAGNOSIS — Z794 Long term (current) use of insulin: Secondary | ICD-10-CM | POA: Diagnosis not present

## 2016-12-06 DIAGNOSIS — S60211A Contusion of right wrist, initial encounter: Secondary | ICD-10-CM

## 2016-12-06 DIAGNOSIS — S60221A Contusion of right hand, initial encounter: Secondary | ICD-10-CM | POA: Diagnosis not present

## 2016-12-06 DIAGNOSIS — I509 Heart failure, unspecified: Secondary | ICD-10-CM | POA: Diagnosis not present

## 2016-12-06 DIAGNOSIS — Z5181 Encounter for therapeutic drug level monitoring: Secondary | ICD-10-CM | POA: Diagnosis not present

## 2016-12-06 DIAGNOSIS — Z1329 Encounter for screening for other suspected endocrine disorder: Secondary | ICD-10-CM | POA: Diagnosis not present

## 2016-12-06 DIAGNOSIS — Z9181 History of falling: Secondary | ICD-10-CM | POA: Diagnosis not present

## 2016-12-06 LAB — CBC WITH DIFFERENTIAL/PLATELET
BASOS PCT: 1 %
Basophils Absolute: 106 cells/uL (ref 0–200)
EOS PCT: 3 %
Eosinophils Absolute: 318 cells/uL (ref 15–500)
HCT: 39 % (ref 35.0–45.0)
Hemoglobin: 12.7 g/dL (ref 11.7–15.5)
LYMPHS PCT: 9 %
Lymphs Abs: 954 cells/uL (ref 850–3900)
MCH: 31.6 pg (ref 27.0–33.0)
MCHC: 32.6 g/dL (ref 32.0–36.0)
MCV: 97 fL (ref 80.0–100.0)
MONOS PCT: 8 %
MPV: 9.9 fL (ref 7.5–12.5)
Monocytes Absolute: 848 cells/uL (ref 200–950)
NEUTROS PCT: 79 %
Neutro Abs: 8374 cells/uL — ABNORMAL HIGH (ref 1500–7800)
PLATELETS: 274 10*3/uL (ref 140–400)
RBC: 4.02 MIL/uL (ref 3.80–5.10)
RDW: 13.6 % (ref 11.0–15.0)
WBC: 10.6 10*3/uL (ref 3.8–10.8)

## 2016-12-06 LAB — TSH: TSH: 5.34 m[IU]/L — AB

## 2016-12-06 LAB — COMPLETE METABOLIC PANEL WITH GFR
ALBUMIN: 3.8 g/dL (ref 3.6–5.1)
ALK PHOS: 70 U/L (ref 33–130)
ALT: 13 U/L (ref 6–29)
AST: 20 U/L (ref 10–35)
BUN: 21 mg/dL (ref 7–25)
CALCIUM: 9 mg/dL (ref 8.6–10.4)
CO2: 23 mmol/L (ref 20–31)
Chloride: 104 mmol/L (ref 98–110)
Creat: 1.46 mg/dL — ABNORMAL HIGH (ref 0.60–0.88)
GFR, EST NON AFRICAN AMERICAN: 32 mL/min — AB (ref >=60)
GFR, Est African American: 37 mL/min — ABNORMAL LOW (ref >=60)
Glucose, Bld: 268 mg/dL — ABNORMAL HIGH (ref 65–99)
POTASSIUM: 4.3 mmol/L (ref 3.5–5.3)
Sodium: 139 mmol/L (ref 135–146)
Total Bilirubin: 0.6 mg/dL (ref 0.2–1.2)
Total Protein: 6.1 g/dL (ref 6.1–8.1)

## 2016-12-06 NOTE — Progress Notes (Signed)
Subjective:    CC: Fall  HPI:  Patient comes in today because she fell area she said she was in her [place]. They had moved a red but is normally in her bathroom where she gets out of her shower into the hall and she thinks that she just got her feet tangled and it. When she fell she hit the left side of her 4 head and left I. Her daughter who is here with her today says the bruising is actually getting better around her eye. She did have some blurry vision for a couple of hours immediately after the injury but it seems to be back to baseline now. She also injured her right hand. She says she reached out if she fell. She has a large bruise over the first metacarpal bone. She says it Radiates to the base of her thumb. She has not had any headaches or vomiting and did not lose consciousness. She did have an episode yesterday where she yelled out for her daughter. The daughter so she came in the room and she said she just didn't feel good. She was really unable to express any specific symptoms. That she denied any chest pain or breathing problems. The daughter says several times she nearly passed out. She started slumping to the side and then she would tap her and she would sort of weight back up. She says this went on for about 30 minutes and then it seemed to ease off. She does feel weak. They did check a blood sugar and it was 252 at the time. They did not check her blood pressure.  Also from the fall she has right great toe pain. She says that was more painful than her 4 head. It's painful to put stand up and bear weight on the foot. It's also red and hot. The bruising extends from the great toe all the way to the fifth toe across the distal metatarsal heads. No break in the skin  Past medical history, Surgical history, Family history not pertinant except as noted below, Social history, Allergies, and medications have been entered into the medical record, reviewed, and corrections made.   Review of  Systems: No fevers, chills, night sweats, weight loss, chest pain, or shortness of breath.   Objective:    General: Well Developed, well nourished, and in no acute distress.  Neuro: Alert and oriented x3, extra-ocular muscles intact, sensation grossly intact.  HEENT: Normocephalic, atraumatic , Extraocular movements are intact, pupils equal round and reactive to light. Skin: Warm and dry, no rashes.She does have some bruising over the left forehead temple and around the eye but it does appear to be healing. No swelling of the lid. Right hand and wrist and fingers with normal range of motion. Thumb with good strength in all 4 directions. She does have some tenderness at the base of the thumb and over the bruised area. Good strength with grip. Right toe is very swollen and bruised. There is some increased erythema and warmth over the base of the great toe. She also has significant bruising bruising all over to the fifth toe. These are mildly tender over the distal metatarsal heads. Cardiac: Regular rate and rhythm, no murmurs rubs or gallops, no lower extremity edema.  Respiratory: Clear to auscultation bilaterally. Not using accessory muscles, speaking in full sentences.   Impression and Recommendations:   Left /head contusion-appears to be healing well.no vision changes.    Episode of weakness/near-syncope-unclear etiology. Not sure if related to  the head injury 2 days prior or if it could be low blood pressure. It does not seem like it was her glucose since her daughter did check that and it was actually little high that day.  Right hand contusion-I do think she probably hyperextended the thumb a little bit but I do think all the tendons are intact and gave her reassurance.  Right great toe pain-we'll get x-ray to evaluate for possible fracture. Exam is highly suspicious for fracture.  X-ray confirms fracture. It is a closed fracture and looks like it is actually partially a compression fracture.  Will place in postop shoe and see her back in 2-3 weeks.

## 2016-12-08 ENCOUNTER — Ambulatory Visit (INDEPENDENT_AMBULATORY_CARE_PROVIDER_SITE_OTHER): Payer: Medicare Other | Admitting: Pharmacist Clinician (PhC)/ Clinical Pharmacy Specialist

## 2016-12-08 DIAGNOSIS — R2689 Other abnormalities of gait and mobility: Secondary | ICD-10-CM | POA: Diagnosis not present

## 2016-12-08 DIAGNOSIS — Z7901 Long term (current) use of anticoagulants: Secondary | ICD-10-CM | POA: Diagnosis not present

## 2016-12-08 DIAGNOSIS — Z950 Presence of cardiac pacemaker: Secondary | ICD-10-CM | POA: Diagnosis not present

## 2016-12-08 DIAGNOSIS — Z794 Long term (current) use of insulin: Secondary | ICD-10-CM | POA: Diagnosis not present

## 2016-12-08 DIAGNOSIS — I110 Hypertensive heart disease with heart failure: Secondary | ICD-10-CM | POA: Diagnosis not present

## 2016-12-08 DIAGNOSIS — Z96651 Presence of right artificial knee joint: Secondary | ICD-10-CM | POA: Diagnosis not present

## 2016-12-08 DIAGNOSIS — Z9181 History of falling: Secondary | ICD-10-CM | POA: Diagnosis not present

## 2016-12-08 DIAGNOSIS — I509 Heart failure, unspecified: Secondary | ICD-10-CM | POA: Diagnosis not present

## 2016-12-08 DIAGNOSIS — E119 Type 2 diabetes mellitus without complications: Secondary | ICD-10-CM | POA: Diagnosis not present

## 2016-12-08 DIAGNOSIS — Z5181 Encounter for therapeutic drug level monitoring: Secondary | ICD-10-CM | POA: Diagnosis not present

## 2016-12-08 LAB — POCT INR: INR: 2.6

## 2016-12-09 ENCOUNTER — Telehealth: Payer: Self-pay | Admitting: *Deleted

## 2016-12-09 DIAGNOSIS — M869 Osteomyelitis, unspecified: Secondary | ICD-10-CM

## 2016-12-09 DIAGNOSIS — Z7901 Long term (current) use of anticoagulants: Secondary | ICD-10-CM | POA: Diagnosis not present

## 2016-12-09 DIAGNOSIS — Z96651 Presence of right artificial knee joint: Secondary | ICD-10-CM | POA: Diagnosis not present

## 2016-12-09 DIAGNOSIS — Z794 Long term (current) use of insulin: Secondary | ICD-10-CM | POA: Diagnosis not present

## 2016-12-09 DIAGNOSIS — Z9181 History of falling: Secondary | ICD-10-CM | POA: Diagnosis not present

## 2016-12-09 DIAGNOSIS — E119 Type 2 diabetes mellitus without complications: Secondary | ICD-10-CM | POA: Diagnosis not present

## 2016-12-09 DIAGNOSIS — Z5181 Encounter for therapeutic drug level monitoring: Secondary | ICD-10-CM | POA: Diagnosis not present

## 2016-12-09 DIAGNOSIS — I110 Hypertensive heart disease with heart failure: Secondary | ICD-10-CM | POA: Diagnosis not present

## 2016-12-09 DIAGNOSIS — R2689 Other abnormalities of gait and mobility: Secondary | ICD-10-CM | POA: Diagnosis not present

## 2016-12-09 DIAGNOSIS — Z950 Presence of cardiac pacemaker: Secondary | ICD-10-CM | POA: Diagnosis not present

## 2016-12-09 DIAGNOSIS — I509 Heart failure, unspecified: Secondary | ICD-10-CM | POA: Diagnosis not present

## 2016-12-09 MED ORDER — AMBULATORY NON FORMULARY MEDICATION: 0 refills | Status: DC

## 2016-12-09 NOTE — Telephone Encounter (Signed)
[name] called and asked for an order for a gel cushion for pt's wheelchair.[name]

## 2016-12-10 DIAGNOSIS — Z9181 History of falling: Secondary | ICD-10-CM | POA: Diagnosis not present

## 2016-12-10 DIAGNOSIS — Z96651 Presence of right artificial knee joint: Secondary | ICD-10-CM | POA: Diagnosis not present

## 2016-12-10 DIAGNOSIS — R2689 Other abnormalities of gait and mobility: Secondary | ICD-10-CM | POA: Diagnosis not present

## 2016-12-10 DIAGNOSIS — Z5181 Encounter for therapeutic drug level monitoring: Secondary | ICD-10-CM | POA: Diagnosis not present

## 2016-12-10 DIAGNOSIS — Z950 Presence of cardiac pacemaker: Secondary | ICD-10-CM | POA: Diagnosis not present

## 2016-12-10 DIAGNOSIS — Z7901 Long term (current) use of anticoagulants: Secondary | ICD-10-CM | POA: Diagnosis not present

## 2016-12-10 DIAGNOSIS — S72351D Displaced comminuted fracture of shaft of right femur, subsequent encounter for closed fracture with routine healing: Secondary | ICD-10-CM | POA: Diagnosis not present

## 2016-12-10 DIAGNOSIS — I110 Hypertensive heart disease with heart failure: Secondary | ICD-10-CM | POA: Diagnosis not present

## 2016-12-10 DIAGNOSIS — Z794 Long term (current) use of insulin: Secondary | ICD-10-CM | POA: Diagnosis not present

## 2016-12-10 DIAGNOSIS — I509 Heart failure, unspecified: Secondary | ICD-10-CM | POA: Diagnosis not present

## 2016-12-10 DIAGNOSIS — E119 Type 2 diabetes mellitus without complications: Secondary | ICD-10-CM | POA: Diagnosis not present

## 2016-12-10 DIAGNOSIS — G8929 Other chronic pain: Secondary | ICD-10-CM | POA: Diagnosis not present

## 2016-12-13 ENCOUNTER — Ambulatory Visit (INDEPENDENT_AMBULATORY_CARE_PROVIDER_SITE_OTHER): Payer: Medicare Other | Admitting: Pharmacist

## 2016-12-13 DIAGNOSIS — R2689 Other abnormalities of gait and mobility: Secondary | ICD-10-CM | POA: Diagnosis not present

## 2016-12-13 DIAGNOSIS — Z9181 History of falling: Secondary | ICD-10-CM | POA: Diagnosis not present

## 2016-12-13 DIAGNOSIS — I509 Heart failure, unspecified: Secondary | ICD-10-CM | POA: Diagnosis not present

## 2016-12-13 DIAGNOSIS — Z5181 Encounter for therapeutic drug level monitoring: Secondary | ICD-10-CM | POA: Diagnosis not present

## 2016-12-13 DIAGNOSIS — Z7901 Long term (current) use of anticoagulants: Secondary | ICD-10-CM | POA: Diagnosis not present

## 2016-12-13 DIAGNOSIS — Z794 Long term (current) use of insulin: Secondary | ICD-10-CM | POA: Diagnosis not present

## 2016-12-13 DIAGNOSIS — Z96651 Presence of right artificial knee joint: Secondary | ICD-10-CM | POA: Diagnosis not present

## 2016-12-13 DIAGNOSIS — Z950 Presence of cardiac pacemaker: Secondary | ICD-10-CM | POA: Diagnosis not present

## 2016-12-13 DIAGNOSIS — I110 Hypertensive heart disease with heart failure: Secondary | ICD-10-CM | POA: Diagnosis not present

## 2016-12-13 DIAGNOSIS — E119 Type 2 diabetes mellitus without complications: Secondary | ICD-10-CM | POA: Diagnosis not present

## 2016-12-13 LAB — PROTIME-INR: INR: 3.1 — AB (ref <=1.1)

## 2016-12-14 ENCOUNTER — Telehealth: Payer: Self-pay | Admitting: Family Medicine

## 2016-12-14 NOTE — Telephone Encounter (Signed)
[name] [phone]) from [facility] called to get a verbal for the following order:  Continue home nurse once a week for 9 weeks. Continue home nurse aid twice a week for 9 weeks.  Will route for approval.

## 2016-12-15 NOTE — Telephone Encounter (Signed)
Yes, please approve

## 2016-12-15 NOTE — Telephone Encounter (Signed)
Verbal given 

## 2016-12-17 DIAGNOSIS — Z9181 History of falling: Secondary | ICD-10-CM | POA: Diagnosis not present

## 2016-12-17 DIAGNOSIS — E119 Type 2 diabetes mellitus without complications: Secondary | ICD-10-CM | POA: Diagnosis not present

## 2016-12-17 DIAGNOSIS — Z96651 Presence of right artificial knee joint: Secondary | ICD-10-CM | POA: Diagnosis not present

## 2016-12-17 DIAGNOSIS — R2689 Other abnormalities of gait and mobility: Secondary | ICD-10-CM | POA: Diagnosis not present

## 2016-12-17 DIAGNOSIS — I110 Hypertensive heart disease with heart failure: Secondary | ICD-10-CM | POA: Diagnosis not present

## 2016-12-17 DIAGNOSIS — Z5181 Encounter for therapeutic drug level monitoring: Secondary | ICD-10-CM | POA: Diagnosis not present

## 2016-12-17 DIAGNOSIS — Z794 Long term (current) use of insulin: Secondary | ICD-10-CM | POA: Diagnosis not present

## 2016-12-17 DIAGNOSIS — Z7901 Long term (current) use of anticoagulants: Secondary | ICD-10-CM | POA: Diagnosis not present

## 2016-12-17 DIAGNOSIS — Z950 Presence of cardiac pacemaker: Secondary | ICD-10-CM | POA: Diagnosis not present

## 2016-12-17 DIAGNOSIS — I509 Heart failure, unspecified: Secondary | ICD-10-CM | POA: Diagnosis not present

## 2016-12-21 ENCOUNTER — Ambulatory Visit (INDEPENDENT_AMBULATORY_CARE_PROVIDER_SITE_OTHER): Payer: Medicare Other | Admitting: Pharmacist

## 2016-12-21 ENCOUNTER — Encounter: Payer: Self-pay | Admitting: Family Medicine

## 2016-12-21 DIAGNOSIS — Z794 Long term (current) use of insulin: Secondary | ICD-10-CM | POA: Diagnosis not present

## 2016-12-21 DIAGNOSIS — R2689 Other abnormalities of gait and mobility: Secondary | ICD-10-CM | POA: Diagnosis not present

## 2016-12-21 DIAGNOSIS — I509 Heart failure, unspecified: Secondary | ICD-10-CM | POA: Diagnosis not present

## 2016-12-21 DIAGNOSIS — Z9181 History of falling: Secondary | ICD-10-CM | POA: Diagnosis not present

## 2016-12-21 DIAGNOSIS — E119 Type 2 diabetes mellitus without complications: Secondary | ICD-10-CM | POA: Diagnosis not present

## 2016-12-21 DIAGNOSIS — Z7901 Long term (current) use of anticoagulants: Secondary | ICD-10-CM | POA: Diagnosis not present

## 2016-12-21 DIAGNOSIS — Z5181 Encounter for therapeutic drug level monitoring: Secondary | ICD-10-CM | POA: Diagnosis not present

## 2016-12-21 DIAGNOSIS — I110 Hypertensive heart disease with heart failure: Secondary | ICD-10-CM | POA: Diagnosis not present

## 2016-12-21 DIAGNOSIS — Z950 Presence of cardiac pacemaker: Secondary | ICD-10-CM | POA: Diagnosis not present

## 2016-12-21 DIAGNOSIS — Z96651 Presence of right artificial knee joint: Secondary | ICD-10-CM | POA: Diagnosis not present

## 2016-12-21 LAB — POCT INR: INR: 5.5

## 2016-12-23 ENCOUNTER — Encounter: Payer: Self-pay | Admitting: Family Medicine

## 2016-12-23 ENCOUNTER — Ambulatory Visit (INDEPENDENT_AMBULATORY_CARE_PROVIDER_SITE_OTHER): Payer: Medicare Other | Admitting: Family Medicine

## 2016-12-23 ENCOUNTER — Ambulatory Visit (INDEPENDENT_AMBULATORY_CARE_PROVIDER_SITE_OTHER): Payer: Medicare Other

## 2016-12-23 VITALS — BP 141/61 | HR 84 | Ht 67.0 in

## 2016-12-23 DIAGNOSIS — M79641 Pain in right hand: Secondary | ICD-10-CM | POA: Diagnosis not present

## 2016-12-23 DIAGNOSIS — W19XXXD Unspecified fall, subsequent encounter: Secondary | ICD-10-CM | POA: Diagnosis not present

## 2016-12-23 DIAGNOSIS — S92424D Nondisplaced fracture of distal phalanx of right great toe, subsequent encounter for fracture with routine healing: Secondary | ICD-10-CM | POA: Diagnosis not present

## 2016-12-23 DIAGNOSIS — S92411A Displaced fracture of proximal phalanx of right great toe, initial encounter for closed fracture: Secondary | ICD-10-CM | POA: Diagnosis not present

## 2016-12-23 DIAGNOSIS — S6991XA Unspecified injury of right wrist, hand and finger(s), initial encounter: Secondary | ICD-10-CM | POA: Diagnosis not present

## 2016-12-23 NOTE — Progress Notes (Signed)
Subjective:    CC: [place] up from recent fall.  HPI:  I saw her 3 weeks ago for a fall in the home. She had had a left for head contusion. She also had a contusion on her right hand and thinks she may have actually hyperextended her thumb slightly. And also injured her right great toe. X-ray did confirm fracture of the right great toe is a closed fracture that actually looks like it's partially a compression fracture. Will she was placed in a postop shoe and is here to follow-up 3 weeks later.She still has a lot of difficulty putting pressure on the great toe. She has been wearing her postop shoe. As far as her hand is concerned she is still having a lot of soreness at the base of the thumb just near her wrist. She says even holding her coffee cup is sore. She says that it's bothered her on and off for years she does think she has arthritis there. Has been much worse since the fall. She is able to grasp things though  Past medical history, Surgical history, Family history not pertinant except as noted below, Social history, Allergies, and medications have been entered into the medical record, reviewed, and corrections made.   Review of Systems: No fevers, chills, night sweats, weight loss, chest pain, or shortness of breath.   Objective:    General: Well Developed, well nourished, and in no acute distress.  Neuro: Alert and oriented x3, extra-ocular muscles intact, sensation grossly intact.  HEENT: Normocephalic, atraumatic  Skin: Warm and dry, no rashes. Cardiac: Regular rate and rhythm, no murmurs rubs or gallops, no lower extremity edema.  Respiratory: Clear to auscultation bilaterally. Not using accessory muscles, speaking in full sentences. Extremities-right hand and fingers with normal range of motion. No swelling or bruising. She is tender at the base of the thumb and close to the wrist. She does have normal grip and strength but it's painful to flex against resistance. Right great toe  she's able to flex and extend but still mildly swollen. The bruising has completely resolved. And she is still tender at the base of the toe.   Impression and Recommendations:   Follow-up nondisplaced fracture of the distal phalanx of the right great toe -Repeat x-ray today just to make sure that the toe is healing well. Continue with postop shoe.  Right thumb/hand pain-we'll get x-rays just to rule out fracture since still really bothering her. I still think she may have just hyperextended the thumb. She actually might benefit from a thumb spica splint to rest the joint.

## 2016-12-24 DIAGNOSIS — Z5181 Encounter for therapeutic drug level monitoring: Secondary | ICD-10-CM | POA: Diagnosis not present

## 2016-12-24 DIAGNOSIS — I110 Hypertensive heart disease with heart failure: Secondary | ICD-10-CM | POA: Diagnosis not present

## 2016-12-24 DIAGNOSIS — M79641 Pain in right hand: Secondary | ICD-10-CM | POA: Diagnosis not present

## 2016-12-24 DIAGNOSIS — Z950 Presence of cardiac pacemaker: Secondary | ICD-10-CM | POA: Diagnosis not present

## 2016-12-24 DIAGNOSIS — R2689 Other abnormalities of gait and mobility: Secondary | ICD-10-CM | POA: Diagnosis not present

## 2016-12-24 DIAGNOSIS — I509 Heart failure, unspecified: Secondary | ICD-10-CM | POA: Diagnosis not present

## 2016-12-24 DIAGNOSIS — Z96651 Presence of right artificial knee joint: Secondary | ICD-10-CM | POA: Diagnosis not present

## 2016-12-24 DIAGNOSIS — Z7901 Long term (current) use of anticoagulants: Secondary | ICD-10-CM | POA: Diagnosis not present

## 2016-12-24 DIAGNOSIS — Z794 Long term (current) use of insulin: Secondary | ICD-10-CM | POA: Diagnosis not present

## 2016-12-24 DIAGNOSIS — Z9181 History of falling: Secondary | ICD-10-CM | POA: Diagnosis not present

## 2016-12-24 DIAGNOSIS — E119 Type 2 diabetes mellitus without complications: Secondary | ICD-10-CM | POA: Diagnosis not present

## 2016-12-28 ENCOUNTER — Ambulatory Visit (INDEPENDENT_AMBULATORY_CARE_PROVIDER_SITE_OTHER): Payer: Medicare Other | Admitting: Pharmacist Clinician (PhC)/ Clinical Pharmacy Specialist

## 2016-12-28 DIAGNOSIS — Z96651 Presence of right artificial knee joint: Secondary | ICD-10-CM | POA: Diagnosis not present

## 2016-12-28 DIAGNOSIS — Z794 Long term (current) use of insulin: Secondary | ICD-10-CM | POA: Diagnosis not present

## 2016-12-28 DIAGNOSIS — Z5181 Encounter for therapeutic drug level monitoring: Secondary | ICD-10-CM | POA: Diagnosis not present

## 2016-12-28 DIAGNOSIS — Z9181 History of falling: Secondary | ICD-10-CM | POA: Diagnosis not present

## 2016-12-28 DIAGNOSIS — R2689 Other abnormalities of gait and mobility: Secondary | ICD-10-CM | POA: Diagnosis not present

## 2016-12-28 DIAGNOSIS — I509 Heart failure, unspecified: Secondary | ICD-10-CM | POA: Diagnosis not present

## 2016-12-28 DIAGNOSIS — I110 Hypertensive heart disease with heart failure: Secondary | ICD-10-CM | POA: Diagnosis not present

## 2016-12-28 DIAGNOSIS — Z950 Presence of cardiac pacemaker: Secondary | ICD-10-CM | POA: Diagnosis not present

## 2016-12-28 DIAGNOSIS — Z7901 Long term (current) use of anticoagulants: Secondary | ICD-10-CM | POA: Diagnosis not present

## 2016-12-28 DIAGNOSIS — E119 Type 2 diabetes mellitus without complications: Secondary | ICD-10-CM | POA: Diagnosis not present

## 2016-12-28 LAB — POCT INR: INR: 1.9

## 2016-12-30 DIAGNOSIS — Z950 Presence of cardiac pacemaker: Secondary | ICD-10-CM | POA: Diagnosis not present

## 2016-12-30 DIAGNOSIS — Z96651 Presence of right artificial knee joint: Secondary | ICD-10-CM | POA: Diagnosis not present

## 2016-12-30 DIAGNOSIS — Z794 Long term (current) use of insulin: Secondary | ICD-10-CM | POA: Diagnosis not present

## 2016-12-30 DIAGNOSIS — Z7901 Long term (current) use of anticoagulants: Secondary | ICD-10-CM | POA: Diagnosis not present

## 2016-12-30 DIAGNOSIS — I509 Heart failure, unspecified: Secondary | ICD-10-CM | POA: Diagnosis not present

## 2016-12-30 DIAGNOSIS — Z5181 Encounter for therapeutic drug level monitoring: Secondary | ICD-10-CM | POA: Diagnosis not present

## 2016-12-30 DIAGNOSIS — Z9181 History of falling: Secondary | ICD-10-CM | POA: Diagnosis not present

## 2016-12-30 DIAGNOSIS — I110 Hypertensive heart disease with heart failure: Secondary | ICD-10-CM | POA: Diagnosis not present

## 2016-12-30 DIAGNOSIS — E119 Type 2 diabetes mellitus without complications: Secondary | ICD-10-CM | POA: Diagnosis not present

## 2016-12-30 DIAGNOSIS — R2689 Other abnormalities of gait and mobility: Secondary | ICD-10-CM | POA: Diagnosis not present

## 2017-01-04 ENCOUNTER — Ambulatory Visit (INDEPENDENT_AMBULATORY_CARE_PROVIDER_SITE_OTHER): Payer: Medicare Other | Admitting: Pharmacist

## 2017-01-04 DIAGNOSIS — I509 Heart failure, unspecified: Secondary | ICD-10-CM | POA: Diagnosis not present

## 2017-01-04 DIAGNOSIS — Z7901 Long term (current) use of anticoagulants: Secondary | ICD-10-CM | POA: Diagnosis not present

## 2017-01-04 DIAGNOSIS — Z950 Presence of cardiac pacemaker: Secondary | ICD-10-CM | POA: Diagnosis not present

## 2017-01-04 DIAGNOSIS — Z96651 Presence of right artificial knee joint: Secondary | ICD-10-CM | POA: Diagnosis not present

## 2017-01-04 DIAGNOSIS — Z9181 History of falling: Secondary | ICD-10-CM | POA: Diagnosis not present

## 2017-01-04 DIAGNOSIS — E119 Type 2 diabetes mellitus without complications: Secondary | ICD-10-CM | POA: Diagnosis not present

## 2017-01-04 DIAGNOSIS — Z794 Long term (current) use of insulin: Secondary | ICD-10-CM | POA: Diagnosis not present

## 2017-01-04 DIAGNOSIS — Z5181 Encounter for therapeutic drug level monitoring: Secondary | ICD-10-CM | POA: Diagnosis not present

## 2017-01-04 DIAGNOSIS — R2689 Other abnormalities of gait and mobility: Secondary | ICD-10-CM | POA: Diagnosis not present

## 2017-01-04 DIAGNOSIS — I110 Hypertensive heart disease with heart failure: Secondary | ICD-10-CM | POA: Diagnosis not present

## 2017-01-04 LAB — PROTIME-INR: INR: 2.1 — AB (ref <=1.1)

## 2017-01-05 ENCOUNTER — Other Ambulatory Visit: Payer: Self-pay | Admitting: Family Medicine

## 2017-01-05 DIAGNOSIS — Z794 Long term (current) use of insulin: Secondary | ICD-10-CM | POA: Diagnosis not present

## 2017-01-05 DIAGNOSIS — Z950 Presence of cardiac pacemaker: Secondary | ICD-10-CM | POA: Diagnosis not present

## 2017-01-05 DIAGNOSIS — Z7901 Long term (current) use of anticoagulants: Secondary | ICD-10-CM | POA: Diagnosis not present

## 2017-01-05 DIAGNOSIS — E119 Type 2 diabetes mellitus without complications: Secondary | ICD-10-CM | POA: Diagnosis not present

## 2017-01-05 DIAGNOSIS — Z9181 History of falling: Secondary | ICD-10-CM | POA: Diagnosis not present

## 2017-01-05 DIAGNOSIS — I509 Heart failure, unspecified: Secondary | ICD-10-CM | POA: Diagnosis not present

## 2017-01-05 DIAGNOSIS — Z96651 Presence of right artificial knee joint: Secondary | ICD-10-CM | POA: Diagnosis not present

## 2017-01-05 DIAGNOSIS — R2689 Other abnormalities of gait and mobility: Secondary | ICD-10-CM | POA: Diagnosis not present

## 2017-01-05 DIAGNOSIS — I110 Hypertensive heart disease with heart failure: Secondary | ICD-10-CM | POA: Diagnosis not present

## 2017-01-05 DIAGNOSIS — Z5181 Encounter for therapeutic drug level monitoring: Secondary | ICD-10-CM | POA: Diagnosis not present

## 2017-01-07 DIAGNOSIS — Z96651 Presence of right artificial knee joint: Secondary | ICD-10-CM | POA: Diagnosis not present

## 2017-01-07 DIAGNOSIS — R2689 Other abnormalities of gait and mobility: Secondary | ICD-10-CM | POA: Diagnosis not present

## 2017-01-07 DIAGNOSIS — I509 Heart failure, unspecified: Secondary | ICD-10-CM | POA: Diagnosis not present

## 2017-01-07 DIAGNOSIS — Z9181 History of falling: Secondary | ICD-10-CM | POA: Diagnosis not present

## 2017-01-07 DIAGNOSIS — I110 Hypertensive heart disease with heart failure: Secondary | ICD-10-CM | POA: Diagnosis not present

## 2017-01-07 DIAGNOSIS — Z950 Presence of cardiac pacemaker: Secondary | ICD-10-CM | POA: Diagnosis not present

## 2017-01-07 DIAGNOSIS — Z7901 Long term (current) use of anticoagulants: Secondary | ICD-10-CM | POA: Diagnosis not present

## 2017-01-07 DIAGNOSIS — Z5181 Encounter for therapeutic drug level monitoring: Secondary | ICD-10-CM | POA: Diagnosis not present

## 2017-01-07 DIAGNOSIS — E119 Type 2 diabetes mellitus without complications: Secondary | ICD-10-CM | POA: Diagnosis not present

## 2017-01-07 DIAGNOSIS — Z794 Long term (current) use of insulin: Secondary | ICD-10-CM | POA: Diagnosis not present

## 2017-01-10 DIAGNOSIS — Z9181 History of falling: Secondary | ICD-10-CM | POA: Diagnosis not present

## 2017-01-10 DIAGNOSIS — I509 Heart failure, unspecified: Secondary | ICD-10-CM | POA: Diagnosis not present

## 2017-01-10 DIAGNOSIS — Z96651 Presence of right artificial knee joint: Secondary | ICD-10-CM | POA: Diagnosis not present

## 2017-01-10 DIAGNOSIS — Z794 Long term (current) use of insulin: Secondary | ICD-10-CM | POA: Diagnosis not present

## 2017-01-10 DIAGNOSIS — Z5181 Encounter for therapeutic drug level monitoring: Secondary | ICD-10-CM | POA: Diagnosis not present

## 2017-01-10 DIAGNOSIS — Z950 Presence of cardiac pacemaker: Secondary | ICD-10-CM | POA: Diagnosis not present

## 2017-01-10 DIAGNOSIS — R262 Difficulty in walking, not elsewhere classified: Secondary | ICD-10-CM | POA: Diagnosis not present

## 2017-01-10 DIAGNOSIS — I110 Hypertensive heart disease with heart failure: Secondary | ICD-10-CM | POA: Diagnosis not present

## 2017-01-10 DIAGNOSIS — R2689 Other abnormalities of gait and mobility: Secondary | ICD-10-CM | POA: Diagnosis not present

## 2017-01-10 DIAGNOSIS — Z7901 Long term (current) use of anticoagulants: Secondary | ICD-10-CM | POA: Diagnosis not present

## 2017-01-10 DIAGNOSIS — E119 Type 2 diabetes mellitus without complications: Secondary | ICD-10-CM | POA: Diagnosis not present

## 2017-01-11 ENCOUNTER — Ambulatory Visit (INDEPENDENT_AMBULATORY_CARE_PROVIDER_SITE_OTHER): Payer: Medicare Other | Admitting: Pharmacist

## 2017-01-11 DIAGNOSIS — I509 Heart failure, unspecified: Secondary | ICD-10-CM | POA: Diagnosis not present

## 2017-01-11 DIAGNOSIS — Z96651 Presence of right artificial knee joint: Secondary | ICD-10-CM | POA: Diagnosis not present

## 2017-01-11 DIAGNOSIS — R2689 Other abnormalities of gait and mobility: Secondary | ICD-10-CM | POA: Diagnosis not present

## 2017-01-11 DIAGNOSIS — Z950 Presence of cardiac pacemaker: Secondary | ICD-10-CM | POA: Diagnosis not present

## 2017-01-11 DIAGNOSIS — Z9181 History of falling: Secondary | ICD-10-CM | POA: Diagnosis not present

## 2017-01-11 DIAGNOSIS — Z794 Long term (current) use of insulin: Secondary | ICD-10-CM | POA: Diagnosis not present

## 2017-01-11 DIAGNOSIS — E119 Type 2 diabetes mellitus without complications: Secondary | ICD-10-CM | POA: Diagnosis not present

## 2017-01-11 DIAGNOSIS — Z5181 Encounter for therapeutic drug level monitoring: Secondary | ICD-10-CM | POA: Diagnosis not present

## 2017-01-11 DIAGNOSIS — I110 Hypertensive heart disease with heart failure: Secondary | ICD-10-CM | POA: Diagnosis not present

## 2017-01-11 DIAGNOSIS — Z7901 Long term (current) use of anticoagulants: Secondary | ICD-10-CM | POA: Diagnosis not present

## 2017-01-11 LAB — PROTIME-INR: INR: 2.2 — AB (ref <=1.1)

## 2017-01-17 DIAGNOSIS — Z5181 Encounter for therapeutic drug level monitoring: Secondary | ICD-10-CM | POA: Diagnosis not present

## 2017-01-17 DIAGNOSIS — I110 Hypertensive heart disease with heart failure: Secondary | ICD-10-CM | POA: Diagnosis not present

## 2017-01-17 DIAGNOSIS — Z794 Long term (current) use of insulin: Secondary | ICD-10-CM | POA: Diagnosis not present

## 2017-01-17 DIAGNOSIS — R2689 Other abnormalities of gait and mobility: Secondary | ICD-10-CM | POA: Diagnosis not present

## 2017-01-17 DIAGNOSIS — Z950 Presence of cardiac pacemaker: Secondary | ICD-10-CM | POA: Diagnosis not present

## 2017-01-17 DIAGNOSIS — Z9181 History of falling: Secondary | ICD-10-CM | POA: Diagnosis not present

## 2017-01-17 DIAGNOSIS — Z7901 Long term (current) use of anticoagulants: Secondary | ICD-10-CM | POA: Diagnosis not present

## 2017-01-17 DIAGNOSIS — I509 Heart failure, unspecified: Secondary | ICD-10-CM | POA: Diagnosis not present

## 2017-01-17 DIAGNOSIS — Z96651 Presence of right artificial knee joint: Secondary | ICD-10-CM | POA: Diagnosis not present

## 2017-01-17 DIAGNOSIS — E119 Type 2 diabetes mellitus without complications: Secondary | ICD-10-CM | POA: Diagnosis not present

## 2017-01-19 ENCOUNTER — Ambulatory Visit (INDEPENDENT_AMBULATORY_CARE_PROVIDER_SITE_OTHER): Payer: Medicare Other | Admitting: Family Medicine

## 2017-01-19 ENCOUNTER — Encounter: Payer: Self-pay | Admitting: Family Medicine

## 2017-01-19 ENCOUNTER — Ambulatory Visit (INDEPENDENT_AMBULATORY_CARE_PROVIDER_SITE_OTHER): Payer: Medicare Other

## 2017-01-19 VITALS — BP 136/57 | HR 65

## 2017-01-19 DIAGNOSIS — I509 Heart failure, unspecified: Secondary | ICD-10-CM | POA: Diagnosis not present

## 2017-01-19 DIAGNOSIS — Z794 Long term (current) use of insulin: Secondary | ICD-10-CM | POA: Diagnosis not present

## 2017-01-19 DIAGNOSIS — S92411A Displaced fracture of proximal phalanx of right great toe, initial encounter for closed fracture: Secondary | ICD-10-CM | POA: Diagnosis not present

## 2017-01-19 DIAGNOSIS — M1811 Unilateral primary osteoarthritis of first carpometacarpal joint, right hand: Secondary | ICD-10-CM

## 2017-01-19 DIAGNOSIS — Z9181 History of falling: Secondary | ICD-10-CM | POA: Diagnosis not present

## 2017-01-19 DIAGNOSIS — S92424D Nondisplaced fracture of distal phalanx of right great toe, subsequent encounter for fracture with routine healing: Secondary | ICD-10-CM

## 2017-01-19 DIAGNOSIS — Z96651 Presence of right artificial knee joint: Secondary | ICD-10-CM | POA: Diagnosis not present

## 2017-01-19 DIAGNOSIS — R2689 Other abnormalities of gait and mobility: Secondary | ICD-10-CM | POA: Diagnosis not present

## 2017-01-19 DIAGNOSIS — Z5181 Encounter for therapeutic drug level monitoring: Secondary | ICD-10-CM | POA: Diagnosis not present

## 2017-01-19 DIAGNOSIS — W19XXXD Unspecified fall, subsequent encounter: Secondary | ICD-10-CM | POA: Diagnosis not present

## 2017-01-19 DIAGNOSIS — Z7901 Long term (current) use of anticoagulants: Secondary | ICD-10-CM | POA: Diagnosis not present

## 2017-01-19 DIAGNOSIS — I110 Hypertensive heart disease with heart failure: Secondary | ICD-10-CM | POA: Diagnosis not present

## 2017-01-19 DIAGNOSIS — Z950 Presence of cardiac pacemaker: Secondary | ICD-10-CM | POA: Diagnosis not present

## 2017-01-19 DIAGNOSIS — E119 Type 2 diabetes mellitus without complications: Secondary | ICD-10-CM | POA: Diagnosis not present

## 2017-01-19 NOTE — Assessment & Plan Note (Signed)
Injection as above, return in one month, continue thumb spica brace for another week.

## 2017-01-19 NOTE — Progress Notes (Signed)
   Subjective:    I'm seeing this patient as a consultation for:  [name]  CC: Right hand pain  HPI: 1 month ago this pleasant 83 year old female fell onto an outstretched hand, x-rays didn't show any fractures on the hand or wrist, but she did have severe thumb basal joint osteoarthritis, pain is moderate, persistent, minimal radiation into the distal thumb. Oral analgesics have been ineffective. Patient is agreeable to proceed with interventional treatment today.  Past medical history:  Negative.  See flowsheet/record as well for more information.  Surgical history: Negative.  See flowsheet/record as well for more information.  Family history: Negative.  See flowsheet/record as well for more information.  Social history: Negative.  See flowsheet/record as well for more information.  Allergies, and medications have been entered into the medical record, reviewed, and no changes needed.   Review of Systems: No headache, visual changes, nausea, vomiting, diarrhea, constipation, dizziness, abdominal pain, skin rash, fevers, chills, night sweats, weight loss, swollen lymph nodes, body aches, joint swelling, muscle aches, chest pain, shortness of breath, mood changes, visual or auditory hallucinations.   Objective:   General: Well Developed, well nourished, and in no acute distress.  Neuro/Psych: Alert and oriented x3, extra-ocular muscles intact, able to move all 4 extremities, sensation grossly intact. Skin: Warm and dry, no rashes noted.  Respiratory: Not using accessory muscles, speaking in full sentences, trachea midline.  Cardiovascular: Pulses palpable, no extremity edema. Abdomen: Does not appear distended. Right Wrist: Inspection normal with no visible erythema or swelling. ROM smooth and normal with good flexion and extension and ulnar/radial deviation that is symmetrical with opposite wrist. Palpation is normal over metacarpals, navicular, lunate, and TFCC; tendons  without tenderness/ swelling No snuffbox tenderness. No tenderness over Canal of [place]. Strength 5/5 in all directions without pain. Negative Finkelstein, tinel's and phalens. Negative Watson's test. Tender to palpation at the thumb basal joint  Procedure: Real-time Ultrasound Guided Injection of right thumb basal joint Device: GE Logiq E  Verbal informed consent obtained.  Time-out conducted.  Noted no overlying erythema, induration, or other signs of local infection.  Skin prepped in a sterile fashion.  Local anesthesia: Topical Ethyl chloride.  With sterile technique and under real time ultrasound guidance:  Injected 1/2 mL kenalog 40, 1/2 mL lidocaine easily. Completed without difficulty  Pain immediately resolved suggesting accurate placement of the medication.  Advised to call if fevers/chills, erythema, induration, drainage, or persistent bleeding.  Images permanently stored and available for review in the ultrasound unit.  Impression: Technically successful ultrasound guided injection.  Impression and Recommendations:   This case required medical decision making of moderate complexity.  Primary osteoarthritis of first carpometacarpal joint of right hand Injection as above, return in one month, continue thumb spica brace for another week.

## 2017-01-19 NOTE — Progress Notes (Signed)
Subjective:    CC:  F/U right great toe fracture  HPI: Closed nondisplaced fracture of distal phalanx of right great toe with routine healing, subsequent encounter.  She Is doing better overall. She's actually been able to bear weight on it. The longest was about 5 minutes the other day she also mostly sits in a wheelchair during the day. He says really the pain is almost completely gone.  She is still concerned about her thumb though. She still having a lot of pain and discomfort. She's been wearing a thumb spica.   Past medical history, Surgical history, Family history not pertinant except as noted below, Social history, Allergies, and medications have been entered into the medical record, reviewed, and corrections made.   Review of Systems: No fevers, chills, night sweats, weight loss, chest pain, or shortness of breath.   Objective:    General: Well Developed, well nourished, and in no acute distress.  Neuro: Alert and oriented x3, extra-ocular muscles intact, sensation grossly intact.  HEENT: Normocephalic, atraumatic  Skin: Warm and dry, no rashes. Cardiac: Regular rate and rhythm, no murmurs rubs or gallops, no lower extremity edema.  Respiratory: Clear to auscultation bilaterally. Not using accessory muscles, speaking in full sentences. Ext: Right great toe with just some trace swelling at the base of the toe. Just mildly tender in that area. No erythema. She's able to flex and extend the toe without difficulty. Feet and toes are nice and warm.   Impression and Recommendations:    Closed nondisplaced fracture of distal phalanx of right great toe - overall she is doing weight and is able to bear weight on it. I like to get one more x-ray just to make sure that the healing looks great and she is able to completely get out of the postop shoe. Next  Right thumb pain-we'll have Dr. Dianah Field see her today as I do think she would benefit from further treatment

## 2017-01-21 DIAGNOSIS — Z794 Long term (current) use of insulin: Secondary | ICD-10-CM | POA: Diagnosis not present

## 2017-01-21 DIAGNOSIS — Z96651 Presence of right artificial knee joint: Secondary | ICD-10-CM | POA: Diagnosis not present

## 2017-01-21 DIAGNOSIS — Z950 Presence of cardiac pacemaker: Secondary | ICD-10-CM | POA: Diagnosis not present

## 2017-01-21 DIAGNOSIS — I509 Heart failure, unspecified: Secondary | ICD-10-CM | POA: Diagnosis not present

## 2017-01-21 DIAGNOSIS — E119 Type 2 diabetes mellitus without complications: Secondary | ICD-10-CM | POA: Diagnosis not present

## 2017-01-21 DIAGNOSIS — Z5181 Encounter for therapeutic drug level monitoring: Secondary | ICD-10-CM | POA: Diagnosis not present

## 2017-01-21 DIAGNOSIS — R2689 Other abnormalities of gait and mobility: Secondary | ICD-10-CM | POA: Diagnosis not present

## 2017-01-21 DIAGNOSIS — Z9181 History of falling: Secondary | ICD-10-CM | POA: Diagnosis not present

## 2017-01-21 DIAGNOSIS — I110 Hypertensive heart disease with heart failure: Secondary | ICD-10-CM | POA: Diagnosis not present

## 2017-01-21 DIAGNOSIS — Z7901 Long term (current) use of anticoagulants: Secondary | ICD-10-CM | POA: Diagnosis not present

## 2017-01-24 DIAGNOSIS — R2689 Other abnormalities of gait and mobility: Secondary | ICD-10-CM | POA: Diagnosis not present

## 2017-01-24 DIAGNOSIS — Z9181 History of falling: Secondary | ICD-10-CM | POA: Diagnosis not present

## 2017-01-24 DIAGNOSIS — E119 Type 2 diabetes mellitus without complications: Secondary | ICD-10-CM | POA: Diagnosis not present

## 2017-01-24 DIAGNOSIS — Z96651 Presence of right artificial knee joint: Secondary | ICD-10-CM | POA: Diagnosis not present

## 2017-01-24 DIAGNOSIS — I509 Heart failure, unspecified: Secondary | ICD-10-CM | POA: Diagnosis not present

## 2017-01-24 DIAGNOSIS — Z7901 Long term (current) use of anticoagulants: Secondary | ICD-10-CM | POA: Diagnosis not present

## 2017-01-24 DIAGNOSIS — Z950 Presence of cardiac pacemaker: Secondary | ICD-10-CM | POA: Diagnosis not present

## 2017-01-24 DIAGNOSIS — Z794 Long term (current) use of insulin: Secondary | ICD-10-CM | POA: Diagnosis not present

## 2017-01-24 DIAGNOSIS — Z5181 Encounter for therapeutic drug level monitoring: Secondary | ICD-10-CM | POA: Diagnosis not present

## 2017-01-24 DIAGNOSIS — I110 Hypertensive heart disease with heart failure: Secondary | ICD-10-CM | POA: Diagnosis not present

## 2017-01-25 ENCOUNTER — Ambulatory Visit (INDEPENDENT_AMBULATORY_CARE_PROVIDER_SITE_OTHER): Payer: Medicare Other | Admitting: Pharmacist

## 2017-01-25 DIAGNOSIS — Z5181 Encounter for therapeutic drug level monitoring: Secondary | ICD-10-CM | POA: Diagnosis not present

## 2017-01-25 DIAGNOSIS — I110 Hypertensive heart disease with heart failure: Secondary | ICD-10-CM | POA: Diagnosis not present

## 2017-01-25 DIAGNOSIS — I509 Heart failure, unspecified: Secondary | ICD-10-CM | POA: Diagnosis not present

## 2017-01-25 DIAGNOSIS — Z950 Presence of cardiac pacemaker: Secondary | ICD-10-CM | POA: Diagnosis not present

## 2017-01-25 DIAGNOSIS — Z9181 History of falling: Secondary | ICD-10-CM | POA: Diagnosis not present

## 2017-01-25 DIAGNOSIS — Z7901 Long term (current) use of anticoagulants: Secondary | ICD-10-CM | POA: Diagnosis not present

## 2017-01-25 DIAGNOSIS — R2689 Other abnormalities of gait and mobility: Secondary | ICD-10-CM | POA: Diagnosis not present

## 2017-01-25 DIAGNOSIS — Z794 Long term (current) use of insulin: Secondary | ICD-10-CM | POA: Diagnosis not present

## 2017-01-25 DIAGNOSIS — E119 Type 2 diabetes mellitus without complications: Secondary | ICD-10-CM | POA: Diagnosis not present

## 2017-01-25 DIAGNOSIS — Z96651 Presence of right artificial knee joint: Secondary | ICD-10-CM | POA: Diagnosis not present

## 2017-01-25 LAB — PROTIME-INR: INR: 1.7 — AB (ref <=1.1)

## 2017-01-26 DIAGNOSIS — Z5181 Encounter for therapeutic drug level monitoring: Secondary | ICD-10-CM | POA: Diagnosis not present

## 2017-01-26 DIAGNOSIS — Z950 Presence of cardiac pacemaker: Secondary | ICD-10-CM | POA: Diagnosis not present

## 2017-01-26 DIAGNOSIS — R2689 Other abnormalities of gait and mobility: Secondary | ICD-10-CM | POA: Diagnosis not present

## 2017-01-26 DIAGNOSIS — Z794 Long term (current) use of insulin: Secondary | ICD-10-CM | POA: Diagnosis not present

## 2017-01-26 DIAGNOSIS — I509 Heart failure, unspecified: Secondary | ICD-10-CM | POA: Diagnosis not present

## 2017-01-26 DIAGNOSIS — Z7901 Long term (current) use of anticoagulants: Secondary | ICD-10-CM | POA: Diagnosis not present

## 2017-01-26 DIAGNOSIS — Z9181 History of falling: Secondary | ICD-10-CM | POA: Diagnosis not present

## 2017-01-26 DIAGNOSIS — I110 Hypertensive heart disease with heart failure: Secondary | ICD-10-CM | POA: Diagnosis not present

## 2017-01-26 DIAGNOSIS — E119 Type 2 diabetes mellitus without complications: Secondary | ICD-10-CM | POA: Diagnosis not present

## 2017-01-26 DIAGNOSIS — Z96651 Presence of right artificial knee joint: Secondary | ICD-10-CM | POA: Diagnosis not present

## 2017-01-28 ENCOUNTER — Other Ambulatory Visit: Payer: Self-pay | Admitting: Family Medicine

## 2017-01-28 DIAGNOSIS — Z794 Long term (current) use of insulin: Secondary | ICD-10-CM | POA: Diagnosis not present

## 2017-01-28 DIAGNOSIS — E119 Type 2 diabetes mellitus without complications: Secondary | ICD-10-CM | POA: Diagnosis not present

## 2017-01-28 DIAGNOSIS — Z5181 Encounter for therapeutic drug level monitoring: Secondary | ICD-10-CM | POA: Diagnosis not present

## 2017-01-28 DIAGNOSIS — I110 Hypertensive heart disease with heart failure: Secondary | ICD-10-CM | POA: Diagnosis not present

## 2017-01-28 DIAGNOSIS — R2689 Other abnormalities of gait and mobility: Secondary | ICD-10-CM | POA: Diagnosis not present

## 2017-01-28 DIAGNOSIS — I509 Heart failure, unspecified: Secondary | ICD-10-CM | POA: Diagnosis not present

## 2017-01-28 DIAGNOSIS — Z950 Presence of cardiac pacemaker: Secondary | ICD-10-CM | POA: Diagnosis not present

## 2017-01-28 DIAGNOSIS — Z9181 History of falling: Secondary | ICD-10-CM | POA: Diagnosis not present

## 2017-01-28 DIAGNOSIS — Z7901 Long term (current) use of anticoagulants: Secondary | ICD-10-CM | POA: Diagnosis not present

## 2017-01-28 DIAGNOSIS — Z96651 Presence of right artificial knee joint: Secondary | ICD-10-CM | POA: Diagnosis not present

## 2017-01-31 DIAGNOSIS — Z9181 History of falling: Secondary | ICD-10-CM | POA: Diagnosis not present

## 2017-01-31 DIAGNOSIS — R2689 Other abnormalities of gait and mobility: Secondary | ICD-10-CM | POA: Diagnosis not present

## 2017-01-31 DIAGNOSIS — I509 Heart failure, unspecified: Secondary | ICD-10-CM | POA: Diagnosis not present

## 2017-01-31 DIAGNOSIS — Z5181 Encounter for therapeutic drug level monitoring: Secondary | ICD-10-CM | POA: Diagnosis not present

## 2017-01-31 DIAGNOSIS — Z794 Long term (current) use of insulin: Secondary | ICD-10-CM | POA: Diagnosis not present

## 2017-01-31 DIAGNOSIS — Z7901 Long term (current) use of anticoagulants: Secondary | ICD-10-CM | POA: Diagnosis not present

## 2017-01-31 DIAGNOSIS — Z96651 Presence of right artificial knee joint: Secondary | ICD-10-CM | POA: Diagnosis not present

## 2017-01-31 DIAGNOSIS — E119 Type 2 diabetes mellitus without complications: Secondary | ICD-10-CM | POA: Diagnosis not present

## 2017-01-31 DIAGNOSIS — Z950 Presence of cardiac pacemaker: Secondary | ICD-10-CM | POA: Diagnosis not present

## 2017-01-31 DIAGNOSIS — I110 Hypertensive heart disease with heart failure: Secondary | ICD-10-CM | POA: Diagnosis not present

## 2017-02-02 ENCOUNTER — Ambulatory Visit (INDEPENDENT_AMBULATORY_CARE_PROVIDER_SITE_OTHER): Payer: Medicare Other | Admitting: Pharmacist

## 2017-02-02 DIAGNOSIS — Z96651 Presence of right artificial knee joint: Secondary | ICD-10-CM | POA: Diagnosis not present

## 2017-02-02 DIAGNOSIS — Z794 Long term (current) use of insulin: Secondary | ICD-10-CM | POA: Diagnosis not present

## 2017-02-02 DIAGNOSIS — I509 Heart failure, unspecified: Secondary | ICD-10-CM | POA: Diagnosis not present

## 2017-02-02 DIAGNOSIS — Z5181 Encounter for therapeutic drug level monitoring: Secondary | ICD-10-CM | POA: Diagnosis not present

## 2017-02-02 DIAGNOSIS — R2689 Other abnormalities of gait and mobility: Secondary | ICD-10-CM | POA: Diagnosis not present

## 2017-02-02 DIAGNOSIS — Z7901 Long term (current) use of anticoagulants: Secondary | ICD-10-CM | POA: Diagnosis not present

## 2017-02-02 DIAGNOSIS — E119 Type 2 diabetes mellitus without complications: Secondary | ICD-10-CM | POA: Diagnosis not present

## 2017-02-02 DIAGNOSIS — Z9181 History of falling: Secondary | ICD-10-CM | POA: Diagnosis not present

## 2017-02-02 DIAGNOSIS — Z950 Presence of cardiac pacemaker: Secondary | ICD-10-CM | POA: Diagnosis not present

## 2017-02-02 DIAGNOSIS — I110 Hypertensive heart disease with heart failure: Secondary | ICD-10-CM | POA: Diagnosis not present

## 2017-02-02 LAB — PROTIME-INR: INR: 1.8 — AB (ref <=1.1)

## 2017-02-04 DIAGNOSIS — E119 Type 2 diabetes mellitus without complications: Secondary | ICD-10-CM | POA: Diagnosis not present

## 2017-02-04 DIAGNOSIS — Z9181 History of falling: Secondary | ICD-10-CM | POA: Diagnosis not present

## 2017-02-04 DIAGNOSIS — I509 Heart failure, unspecified: Secondary | ICD-10-CM | POA: Diagnosis not present

## 2017-02-04 DIAGNOSIS — Z5181 Encounter for therapeutic drug level monitoring: Secondary | ICD-10-CM | POA: Diagnosis not present

## 2017-02-04 DIAGNOSIS — R2689 Other abnormalities of gait and mobility: Secondary | ICD-10-CM | POA: Diagnosis not present

## 2017-02-04 DIAGNOSIS — Z794 Long term (current) use of insulin: Secondary | ICD-10-CM | POA: Diagnosis not present

## 2017-02-04 DIAGNOSIS — Z7901 Long term (current) use of anticoagulants: Secondary | ICD-10-CM | POA: Diagnosis not present

## 2017-02-04 DIAGNOSIS — Z950 Presence of cardiac pacemaker: Secondary | ICD-10-CM | POA: Diagnosis not present

## 2017-02-04 DIAGNOSIS — Z96651 Presence of right artificial knee joint: Secondary | ICD-10-CM | POA: Diagnosis not present

## 2017-02-04 DIAGNOSIS — I110 Hypertensive heart disease with heart failure: Secondary | ICD-10-CM | POA: Diagnosis not present

## 2017-02-07 DIAGNOSIS — Z9181 History of falling: Secondary | ICD-10-CM | POA: Diagnosis not present

## 2017-02-07 DIAGNOSIS — Z96651 Presence of right artificial knee joint: Secondary | ICD-10-CM | POA: Diagnosis not present

## 2017-02-07 DIAGNOSIS — I110 Hypertensive heart disease with heart failure: Secondary | ICD-10-CM | POA: Diagnosis not present

## 2017-02-07 DIAGNOSIS — I509 Heart failure, unspecified: Secondary | ICD-10-CM | POA: Diagnosis not present

## 2017-02-07 DIAGNOSIS — Z7901 Long term (current) use of anticoagulants: Secondary | ICD-10-CM | POA: Diagnosis not present

## 2017-02-07 DIAGNOSIS — R2689 Other abnormalities of gait and mobility: Secondary | ICD-10-CM | POA: Diagnosis not present

## 2017-02-07 DIAGNOSIS — E119 Type 2 diabetes mellitus without complications: Secondary | ICD-10-CM | POA: Diagnosis not present

## 2017-02-07 DIAGNOSIS — Z794 Long term (current) use of insulin: Secondary | ICD-10-CM | POA: Diagnosis not present

## 2017-02-07 DIAGNOSIS — Z5181 Encounter for therapeutic drug level monitoring: Secondary | ICD-10-CM | POA: Diagnosis not present

## 2017-02-07 DIAGNOSIS — Z950 Presence of cardiac pacemaker: Secondary | ICD-10-CM | POA: Diagnosis not present

## 2017-02-09 DIAGNOSIS — Z5181 Encounter for therapeutic drug level monitoring: Secondary | ICD-10-CM | POA: Diagnosis not present

## 2017-02-09 DIAGNOSIS — I509 Heart failure, unspecified: Secondary | ICD-10-CM | POA: Diagnosis not present

## 2017-02-09 DIAGNOSIS — Z96651 Presence of right artificial knee joint: Secondary | ICD-10-CM | POA: Diagnosis not present

## 2017-02-09 DIAGNOSIS — Z9181 History of falling: Secondary | ICD-10-CM | POA: Diagnosis not present

## 2017-02-09 DIAGNOSIS — Z950 Presence of cardiac pacemaker: Secondary | ICD-10-CM | POA: Diagnosis not present

## 2017-02-09 DIAGNOSIS — Z7901 Long term (current) use of anticoagulants: Secondary | ICD-10-CM | POA: Diagnosis not present

## 2017-02-09 DIAGNOSIS — Z794 Long term (current) use of insulin: Secondary | ICD-10-CM | POA: Diagnosis not present

## 2017-02-09 DIAGNOSIS — I110 Hypertensive heart disease with heart failure: Secondary | ICD-10-CM | POA: Diagnosis not present

## 2017-02-09 DIAGNOSIS — R2689 Other abnormalities of gait and mobility: Secondary | ICD-10-CM | POA: Diagnosis not present

## 2017-02-09 DIAGNOSIS — E119 Type 2 diabetes mellitus without complications: Secondary | ICD-10-CM | POA: Diagnosis not present

## 2017-02-10 ENCOUNTER — Ambulatory Visit (INDEPENDENT_AMBULATORY_CARE_PROVIDER_SITE_OTHER): Payer: Medicare Other | Admitting: Pharmacist Clinician (PhC)/ Clinical Pharmacy Specialist

## 2017-02-10 DIAGNOSIS — Z96651 Presence of right artificial knee joint: Secondary | ICD-10-CM | POA: Diagnosis not present

## 2017-02-10 DIAGNOSIS — Z7901 Long term (current) use of anticoagulants: Secondary | ICD-10-CM | POA: Diagnosis not present

## 2017-02-10 DIAGNOSIS — I509 Heart failure, unspecified: Secondary | ICD-10-CM | POA: Diagnosis not present

## 2017-02-10 DIAGNOSIS — Z9181 History of falling: Secondary | ICD-10-CM | POA: Diagnosis not present

## 2017-02-10 DIAGNOSIS — I110 Hypertensive heart disease with heart failure: Secondary | ICD-10-CM | POA: Diagnosis not present

## 2017-02-10 DIAGNOSIS — Z5181 Encounter for therapeutic drug level monitoring: Secondary | ICD-10-CM | POA: Diagnosis not present

## 2017-02-10 DIAGNOSIS — Z950 Presence of cardiac pacemaker: Secondary | ICD-10-CM | POA: Diagnosis not present

## 2017-02-10 DIAGNOSIS — E119 Type 2 diabetes mellitus without complications: Secondary | ICD-10-CM | POA: Diagnosis not present

## 2017-02-10 DIAGNOSIS — R2689 Other abnormalities of gait and mobility: Secondary | ICD-10-CM | POA: Diagnosis not present

## 2017-02-10 DIAGNOSIS — Z794 Long term (current) use of insulin: Secondary | ICD-10-CM | POA: Diagnosis not present

## 2017-02-10 LAB — POCT INR: INR: 2.5

## 2017-02-11 ENCOUNTER — Telehealth: Payer: Self-pay

## 2017-02-11 DIAGNOSIS — Z96651 Presence of right artificial knee joint: Secondary | ICD-10-CM | POA: Diagnosis not present

## 2017-02-11 DIAGNOSIS — Z7901 Long term (current) use of anticoagulants: Secondary | ICD-10-CM | POA: Diagnosis not present

## 2017-02-11 DIAGNOSIS — Z794 Long term (current) use of insulin: Secondary | ICD-10-CM | POA: Diagnosis not present

## 2017-02-11 DIAGNOSIS — E119 Type 2 diabetes mellitus without complications: Secondary | ICD-10-CM | POA: Diagnosis not present

## 2017-02-11 DIAGNOSIS — R2689 Other abnormalities of gait and mobility: Secondary | ICD-10-CM | POA: Diagnosis not present

## 2017-02-11 DIAGNOSIS — I509 Heart failure, unspecified: Secondary | ICD-10-CM | POA: Diagnosis not present

## 2017-02-11 DIAGNOSIS — Z9181 History of falling: Secondary | ICD-10-CM | POA: Diagnosis not present

## 2017-02-11 DIAGNOSIS — Z950 Presence of cardiac pacemaker: Secondary | ICD-10-CM | POA: Diagnosis not present

## 2017-02-11 DIAGNOSIS — I110 Hypertensive heart disease with heart failure: Secondary | ICD-10-CM | POA: Diagnosis not present

## 2017-02-11 DIAGNOSIS — Z5181 Encounter for therapeutic drug level monitoring: Secondary | ICD-10-CM | POA: Diagnosis not present

## 2017-02-11 NOTE — Telephone Encounter (Signed)
Gave home health a verbal to continue with pt INR and also with weekly baths.

## 2017-02-11 NOTE — Telephone Encounter (Signed)
Thank you :)

## 2017-02-14 ENCOUNTER — Telehealth: Payer: Self-pay | Admitting: Family Medicine

## 2017-02-14 NOTE — Telephone Encounter (Signed)
[facility] for continuation of home PT

## 2017-02-14 NOTE — Telephone Encounter (Signed)
Received phone call from Baxter Flattery with [facility] [phone]). They were doing PT with the Pt from her Ortho regarding her hip fracture. The Pt had almost finished therapy when she broke her toe. Now they are requesting continuation of therapy orders.   Requesting: PT 3x week for 3 weeks, then 2x week for 3 weeks. Routing to PCP for review.

## 2017-02-14 NOTE — Telephone Encounter (Signed)
Verbal order given  

## 2017-02-16 ENCOUNTER — Ambulatory Visit (INDEPENDENT_AMBULATORY_CARE_PROVIDER_SITE_OTHER): Payer: Medicare Other | Admitting: Sports Medicine

## 2017-02-16 ENCOUNTER — Encounter: Payer: Self-pay | Admitting: Family Medicine

## 2017-02-16 ENCOUNTER — Ambulatory Visit (INDEPENDENT_AMBULATORY_CARE_PROVIDER_SITE_OTHER): Payer: Medicare Other | Admitting: Family Medicine

## 2017-02-16 ENCOUNTER — Encounter: Payer: Self-pay | Admitting: Sports Medicine

## 2017-02-16 VITALS — BP 117/63 | HR 71 | Wt 188.0 lb

## 2017-02-16 DIAGNOSIS — E1143 Type 2 diabetes mellitus with diabetic autonomic (poly)neuropathy: Secondary | ICD-10-CM | POA: Diagnosis not present

## 2017-02-16 DIAGNOSIS — A09 Infectious gastroenteritis and colitis, unspecified: Secondary | ICD-10-CM | POA: Diagnosis not present

## 2017-02-16 DIAGNOSIS — H6122 Impacted cerumen, left ear: Secondary | ICD-10-CM

## 2017-02-16 DIAGNOSIS — Z794 Long term (current) use of insulin: Secondary | ICD-10-CM | POA: Diagnosis not present

## 2017-02-16 DIAGNOSIS — B9789 Other viral agents as the cause of diseases classified elsewhere: Secondary | ICD-10-CM | POA: Diagnosis not present

## 2017-02-16 DIAGNOSIS — M1811 Unilateral primary osteoarthritis of first carpometacarpal joint, right hand: Secondary | ICD-10-CM

## 2017-02-16 DIAGNOSIS — G8929 Other chronic pain: Secondary | ICD-10-CM

## 2017-02-16 DIAGNOSIS — J069 Acute upper respiratory infection, unspecified: Secondary | ICD-10-CM | POA: Diagnosis not present

## 2017-02-16 LAB — POCT GLYCOSYLATED HEMOGLOBIN (HGB A1C): Hemoglobin A1C: 7.8

## 2017-02-16 MED ORDER — OXYCODONE-ACETAMINOPHEN 5-325 MG PO TABS: 1.0000 | ORAL_TABLET | Freq: Two times a day (BID) | ORAL | 0 refills | Status: AC | PRN

## 2017-02-16 NOTE — Patient Instructions (Signed)
Increase her Lantus to 22 units in the morning and 25 in the evening.

## 2017-02-16 NOTE — Assessment & Plan Note (Signed)
Resolved after injection at the last visit, return as needed.

## 2017-02-16 NOTE — Progress Notes (Signed)
Subjective:    CC: DM  HPI: Diabetes - no hypoglycemic events. No wounds or sores that are not healing well. No increased thirst or urination. Checking glucose at home. Taking medications as prescribed without any side effects.Sugars have been jumping between 100s and 300s. She's currently using 25 units of Lantus in the morning and 20 5 in the evening. Lab Results  Component Value Date   HGBA1C 6.9 11/24/2016   Arthritis of multiple joints-she would like a refill on her oxycodone. She says she uses it sparingly. It was last filled for 60 tabs in November.  She's also been sick over the last 5 days with a cough and runny nose and low-grade fever. She's had diarrhea for about 2 days but says she actually feels much better today with all of her symptoms. She has not experienced any vomiting. She still has a little bit of runny nose and nasal congestion.  Past medical history, Surgical history, Family history not pertinant except as noted below, Social history, Allergies, and medications have been entered into the medical record, reviewed, and corrections made.   Review of Systems: No fevers, chills, night sweats, weight loss, chest pain, or shortness of breath.   Objective:    General: Well Developed, well nourished, and in no acute distress.  Neuro: Alert and oriented x3, extra-ocular muscles intact, sensation grossly intact.  HEENT: Normocephalic, atraumatic, Oropharynx is clear, right TM and canal is clear. Left canal is blocked by cerumen. No significant cervical lymphadenopathy.  Skin: Warm and dry, no rashes. Cardiac: Regular rate and rhythm, no murmurs rubs or gallops, no lower extremity edema.  Respiratory: Clear to auscultation bilaterally. Not using accessory muscles, speaking in full sentences.   Impression and Recommendations:    DM- A1c up to 7.8. Previously looked great at 6.9. Would like her to increase her Lantus to 22 units in the morning and 25 in the evening.  OA -  Will refill her oxycodone. We'll make sure her pain contract is up-to-date and that we have been opioid wrist will on file. Pain contract on 11/24/2016.  Upper respiratory illness with diarrhea-seems to be improving on its own.  Left cerumen impaction-recommended over-the-counter Debrox drops.

## 2017-02-16 NOTE — Progress Notes (Signed)
  Subjective:    CC: Follow-up  HPI: Right thumb basal joint arthritis: Pain resolved after injection last month.  Past medical history:  Negative.  See flowsheet/record as well for more information.  Surgical history: Negative.  See flowsheet/record as well for more information.  Family history: Negative.  See flowsheet/record as well for more information.  Social history: Negative.  See flowsheet/record as well for more information.  Allergies, and medications have been entered into the medical record, reviewed, and no changes needed.   Review of Systems: No fevers, chills, night sweats, weight loss, chest pain, or shortness of breath.   Objective:    General: Well Developed, well nourished, and in no acute distress.  Neuro: Alert and oriented x3, extra-ocular muscles intact, sensation grossly intact.  HEENT: Normocephalic, atraumatic, pupils equal round reactive to light, neck supple, no masses, no lymphadenopathy, thyroid nonpalpable.  Skin: Warm and dry, no rashes. Cardiac: Regular rate and rhythm, no murmurs rubs or gallops, no lower extremity edema.  Respiratory: Clear to auscultation bilaterally. Not using accessory muscles, speaking in full sentences.  Impression and Recommendations:    Primary osteoarthritis of first carpometacarpal joint of right hand Resolved after injection at the last visit, return as needed.

## 2017-02-17 ENCOUNTER — Ambulatory Visit (INDEPENDENT_AMBULATORY_CARE_PROVIDER_SITE_OTHER): Payer: Medicare Other | Admitting: Pharmacist Clinician (PhC)/ Clinical Pharmacy Specialist

## 2017-02-17 LAB — POCT INR: INR: 3.9

## 2017-02-18 DIAGNOSIS — T8451XD Infection and inflammatory reaction due to internal right hip prosthesis, subsequent encounter: Secondary | ICD-10-CM | POA: Diagnosis not present

## 2017-02-18 DIAGNOSIS — Z950 Presence of cardiac pacemaker: Secondary | ICD-10-CM | POA: Diagnosis not present

## 2017-02-18 DIAGNOSIS — Z5181 Encounter for therapeutic drug level monitoring: Secondary | ICD-10-CM | POA: Diagnosis not present

## 2017-02-18 DIAGNOSIS — Z96651 Presence of right artificial knee joint: Secondary | ICD-10-CM | POA: Diagnosis not present

## 2017-02-18 DIAGNOSIS — E119 Type 2 diabetes mellitus without complications: Secondary | ICD-10-CM | POA: Diagnosis not present

## 2017-02-18 DIAGNOSIS — Z792 Long term (current) use of antibiotics: Secondary | ICD-10-CM | POA: Diagnosis not present

## 2017-02-18 DIAGNOSIS — I482 Chronic atrial fibrillation: Secondary | ICD-10-CM | POA: Diagnosis not present

## 2017-02-18 DIAGNOSIS — B952 Enterococcus as the cause of diseases classified elsewhere: Secondary | ICD-10-CM | POA: Diagnosis not present

## 2017-02-18 DIAGNOSIS — Z7901 Long term (current) use of anticoagulants: Secondary | ICD-10-CM | POA: Diagnosis not present

## 2017-02-18 DIAGNOSIS — Z9181 History of falling: Secondary | ICD-10-CM | POA: Diagnosis not present

## 2017-02-18 DIAGNOSIS — M86651 Other chronic osteomyelitis, right thigh: Secondary | ICD-10-CM | POA: Diagnosis not present

## 2017-02-18 DIAGNOSIS — I509 Heart failure, unspecified: Secondary | ICD-10-CM | POA: Diagnosis not present

## 2017-02-18 DIAGNOSIS — I110 Hypertensive heart disease with heart failure: Secondary | ICD-10-CM | POA: Diagnosis not present

## 2017-02-18 DIAGNOSIS — R2689 Other abnormalities of gait and mobility: Secondary | ICD-10-CM | POA: Diagnosis not present

## 2017-02-18 DIAGNOSIS — T847XXD Infection and inflammatory reaction due to other internal orthopedic prosthetic devices, implants and grafts, subsequent encounter: Secondary | ICD-10-CM | POA: Diagnosis not present

## 2017-02-18 DIAGNOSIS — Z8781 Personal history of (healed) traumatic fracture: Secondary | ICD-10-CM | POA: Diagnosis not present

## 2017-02-18 DIAGNOSIS — Z794 Long term (current) use of insulin: Secondary | ICD-10-CM | POA: Diagnosis not present

## 2017-02-18 DIAGNOSIS — I10 Essential (primary) hypertension: Secondary | ICD-10-CM | POA: Diagnosis not present

## 2017-02-21 DIAGNOSIS — Z96651 Presence of right artificial knee joint: Secondary | ICD-10-CM | POA: Diagnosis not present

## 2017-02-21 DIAGNOSIS — Z5181 Encounter for therapeutic drug level monitoring: Secondary | ICD-10-CM | POA: Diagnosis not present

## 2017-02-21 DIAGNOSIS — Z794 Long term (current) use of insulin: Secondary | ICD-10-CM | POA: Diagnosis not present

## 2017-02-21 DIAGNOSIS — E119 Type 2 diabetes mellitus without complications: Secondary | ICD-10-CM | POA: Diagnosis not present

## 2017-02-21 DIAGNOSIS — Z950 Presence of cardiac pacemaker: Secondary | ICD-10-CM | POA: Diagnosis not present

## 2017-02-21 DIAGNOSIS — Z7901 Long term (current) use of anticoagulants: Secondary | ICD-10-CM | POA: Diagnosis not present

## 2017-02-21 DIAGNOSIS — I110 Hypertensive heart disease with heart failure: Secondary | ICD-10-CM | POA: Diagnosis not present

## 2017-02-21 DIAGNOSIS — Z9181 History of falling: Secondary | ICD-10-CM | POA: Diagnosis not present

## 2017-02-21 DIAGNOSIS — I509 Heart failure, unspecified: Secondary | ICD-10-CM | POA: Diagnosis not present

## 2017-02-21 DIAGNOSIS — R2689 Other abnormalities of gait and mobility: Secondary | ICD-10-CM | POA: Diagnosis not present

## 2017-02-23 ENCOUNTER — Telehealth: Payer: Self-pay | Admitting: Cardiovascular Disease

## 2017-02-23 DIAGNOSIS — R2689 Other abnormalities of gait and mobility: Secondary | ICD-10-CM | POA: Diagnosis not present

## 2017-02-23 DIAGNOSIS — Z5181 Encounter for therapeutic drug level monitoring: Secondary | ICD-10-CM | POA: Diagnosis not present

## 2017-02-23 DIAGNOSIS — Z9181 History of falling: Secondary | ICD-10-CM | POA: Diagnosis not present

## 2017-02-23 DIAGNOSIS — E119 Type 2 diabetes mellitus without complications: Secondary | ICD-10-CM | POA: Diagnosis not present

## 2017-02-23 DIAGNOSIS — I110 Hypertensive heart disease with heart failure: Secondary | ICD-10-CM | POA: Diagnosis not present

## 2017-02-23 DIAGNOSIS — Z7901 Long term (current) use of anticoagulants: Secondary | ICD-10-CM | POA: Diagnosis not present

## 2017-02-23 DIAGNOSIS — I509 Heart failure, unspecified: Secondary | ICD-10-CM | POA: Diagnosis not present

## 2017-02-23 DIAGNOSIS — Z794 Long term (current) use of insulin: Secondary | ICD-10-CM | POA: Diagnosis not present

## 2017-02-23 DIAGNOSIS — Z96651 Presence of right artificial knee joint: Secondary | ICD-10-CM | POA: Diagnosis not present

## 2017-02-23 DIAGNOSIS — Z950 Presence of cardiac pacemaker: Secondary | ICD-10-CM | POA: Diagnosis not present

## 2017-02-23 NOTE — Telephone Encounter (Signed)
Appt scheduled

## 2017-02-23 NOTE — Telephone Encounter (Signed)
New message      Pt received a letter stating that she is overdue for a pacemaker check with the doctor.  She does not want to wait until may.  Calling to see if Dr Sallyanne Kuster can work her in anytime in march or April.  Please call

## 2017-02-24 ENCOUNTER — Ambulatory Visit (INDEPENDENT_AMBULATORY_CARE_PROVIDER_SITE_OTHER): Payer: Medicare Other | Admitting: Pharmacist

## 2017-02-24 DIAGNOSIS — I110 Hypertensive heart disease with heart failure: Secondary | ICD-10-CM | POA: Diagnosis not present

## 2017-02-24 DIAGNOSIS — E119 Type 2 diabetes mellitus without complications: Secondary | ICD-10-CM | POA: Diagnosis not present

## 2017-02-24 DIAGNOSIS — I509 Heart failure, unspecified: Secondary | ICD-10-CM | POA: Diagnosis not present

## 2017-02-24 DIAGNOSIS — Z5181 Encounter for therapeutic drug level monitoring: Secondary | ICD-10-CM | POA: Diagnosis not present

## 2017-02-24 DIAGNOSIS — R2689 Other abnormalities of gait and mobility: Secondary | ICD-10-CM | POA: Diagnosis not present

## 2017-02-24 DIAGNOSIS — Z96651 Presence of right artificial knee joint: Secondary | ICD-10-CM | POA: Diagnosis not present

## 2017-02-24 DIAGNOSIS — Z7901 Long term (current) use of anticoagulants: Secondary | ICD-10-CM | POA: Diagnosis not present

## 2017-02-24 DIAGNOSIS — Z9181 History of falling: Secondary | ICD-10-CM | POA: Diagnosis not present

## 2017-02-24 DIAGNOSIS — Z950 Presence of cardiac pacemaker: Secondary | ICD-10-CM | POA: Diagnosis not present

## 2017-02-24 DIAGNOSIS — Z794 Long term (current) use of insulin: Secondary | ICD-10-CM | POA: Diagnosis not present

## 2017-02-24 LAB — PROTIME-INR: INR: 2.6 — AB (ref <=1.1)

## 2017-02-25 DIAGNOSIS — E119 Type 2 diabetes mellitus without complications: Secondary | ICD-10-CM | POA: Diagnosis not present

## 2017-02-25 DIAGNOSIS — Z5181 Encounter for therapeutic drug level monitoring: Secondary | ICD-10-CM | POA: Diagnosis not present

## 2017-02-25 DIAGNOSIS — I110 Hypertensive heart disease with heart failure: Secondary | ICD-10-CM | POA: Diagnosis not present

## 2017-02-25 DIAGNOSIS — Z9181 History of falling: Secondary | ICD-10-CM | POA: Diagnosis not present

## 2017-02-25 DIAGNOSIS — Z950 Presence of cardiac pacemaker: Secondary | ICD-10-CM | POA: Diagnosis not present

## 2017-02-25 DIAGNOSIS — Z7901 Long term (current) use of anticoagulants: Secondary | ICD-10-CM | POA: Diagnosis not present

## 2017-02-25 DIAGNOSIS — I509 Heart failure, unspecified: Secondary | ICD-10-CM | POA: Diagnosis not present

## 2017-02-25 DIAGNOSIS — R2689 Other abnormalities of gait and mobility: Secondary | ICD-10-CM | POA: Diagnosis not present

## 2017-02-25 DIAGNOSIS — Z794 Long term (current) use of insulin: Secondary | ICD-10-CM | POA: Diagnosis not present

## 2017-02-25 DIAGNOSIS — Z96651 Presence of right artificial knee joint: Secondary | ICD-10-CM | POA: Diagnosis not present

## 2017-02-28 DIAGNOSIS — Z950 Presence of cardiac pacemaker: Secondary | ICD-10-CM | POA: Diagnosis not present

## 2017-02-28 DIAGNOSIS — I110 Hypertensive heart disease with heart failure: Secondary | ICD-10-CM | POA: Diagnosis not present

## 2017-02-28 DIAGNOSIS — E119 Type 2 diabetes mellitus without complications: Secondary | ICD-10-CM | POA: Diagnosis not present

## 2017-02-28 DIAGNOSIS — Z7901 Long term (current) use of anticoagulants: Secondary | ICD-10-CM | POA: Diagnosis not present

## 2017-02-28 DIAGNOSIS — Z9181 History of falling: Secondary | ICD-10-CM | POA: Diagnosis not present

## 2017-02-28 DIAGNOSIS — Z96651 Presence of right artificial knee joint: Secondary | ICD-10-CM | POA: Diagnosis not present

## 2017-02-28 DIAGNOSIS — I509 Heart failure, unspecified: Secondary | ICD-10-CM | POA: Diagnosis not present

## 2017-02-28 DIAGNOSIS — Z5181 Encounter for therapeutic drug level monitoring: Secondary | ICD-10-CM | POA: Diagnosis not present

## 2017-02-28 DIAGNOSIS — R2689 Other abnormalities of gait and mobility: Secondary | ICD-10-CM | POA: Diagnosis not present

## 2017-02-28 DIAGNOSIS — Z794 Long term (current) use of insulin: Secondary | ICD-10-CM | POA: Diagnosis not present

## 2017-03-03 ENCOUNTER — Ambulatory Visit (INDEPENDENT_AMBULATORY_CARE_PROVIDER_SITE_OTHER): Payer: Medicare Other | Admitting: Pharmacist Clinician (PhC)/ Clinical Pharmacy Specialist

## 2017-03-03 DIAGNOSIS — Z794 Long term (current) use of insulin: Secondary | ICD-10-CM | POA: Diagnosis not present

## 2017-03-03 DIAGNOSIS — Z950 Presence of cardiac pacemaker: Secondary | ICD-10-CM | POA: Diagnosis not present

## 2017-03-03 DIAGNOSIS — Z9181 History of falling: Secondary | ICD-10-CM | POA: Diagnosis not present

## 2017-03-03 DIAGNOSIS — Z96651 Presence of right artificial knee joint: Secondary | ICD-10-CM | POA: Diagnosis not present

## 2017-03-03 DIAGNOSIS — I509 Heart failure, unspecified: Secondary | ICD-10-CM | POA: Diagnosis not present

## 2017-03-03 DIAGNOSIS — Z7901 Long term (current) use of anticoagulants: Secondary | ICD-10-CM | POA: Diagnosis not present

## 2017-03-03 DIAGNOSIS — I110 Hypertensive heart disease with heart failure: Secondary | ICD-10-CM | POA: Diagnosis not present

## 2017-03-03 DIAGNOSIS — E119 Type 2 diabetes mellitus without complications: Secondary | ICD-10-CM | POA: Diagnosis not present

## 2017-03-03 DIAGNOSIS — R2689 Other abnormalities of gait and mobility: Secondary | ICD-10-CM | POA: Diagnosis not present

## 2017-03-03 DIAGNOSIS — Z5181 Encounter for therapeutic drug level monitoring: Secondary | ICD-10-CM | POA: Diagnosis not present

## 2017-03-03 LAB — POCT INR: INR: 2.6

## 2017-03-04 DIAGNOSIS — R2689 Other abnormalities of gait and mobility: Secondary | ICD-10-CM | POA: Diagnosis not present

## 2017-03-04 DIAGNOSIS — Z5181 Encounter for therapeutic drug level monitoring: Secondary | ICD-10-CM | POA: Diagnosis not present

## 2017-03-04 DIAGNOSIS — Z9181 History of falling: Secondary | ICD-10-CM | POA: Diagnosis not present

## 2017-03-04 DIAGNOSIS — Z794 Long term (current) use of insulin: Secondary | ICD-10-CM | POA: Diagnosis not present

## 2017-03-04 DIAGNOSIS — Z7901 Long term (current) use of anticoagulants: Secondary | ICD-10-CM | POA: Diagnosis not present

## 2017-03-04 DIAGNOSIS — I509 Heart failure, unspecified: Secondary | ICD-10-CM | POA: Diagnosis not present

## 2017-03-04 DIAGNOSIS — Z950 Presence of cardiac pacemaker: Secondary | ICD-10-CM | POA: Diagnosis not present

## 2017-03-04 DIAGNOSIS — Z96651 Presence of right artificial knee joint: Secondary | ICD-10-CM | POA: Diagnosis not present

## 2017-03-04 DIAGNOSIS — I110 Hypertensive heart disease with heart failure: Secondary | ICD-10-CM | POA: Diagnosis not present

## 2017-03-04 DIAGNOSIS — E119 Type 2 diabetes mellitus without complications: Secondary | ICD-10-CM | POA: Diagnosis not present

## 2017-03-07 ENCOUNTER — Other Ambulatory Visit: Payer: Self-pay | Admitting: Family Medicine

## 2017-03-07 DIAGNOSIS — Z7901 Long term (current) use of anticoagulants: Secondary | ICD-10-CM | POA: Diagnosis not present

## 2017-03-07 DIAGNOSIS — R2689 Other abnormalities of gait and mobility: Secondary | ICD-10-CM | POA: Diagnosis not present

## 2017-03-07 DIAGNOSIS — Z96651 Presence of right artificial knee joint: Secondary | ICD-10-CM | POA: Diagnosis not present

## 2017-03-07 DIAGNOSIS — Z9181 History of falling: Secondary | ICD-10-CM | POA: Diagnosis not present

## 2017-03-07 DIAGNOSIS — I509 Heart failure, unspecified: Secondary | ICD-10-CM | POA: Diagnosis not present

## 2017-03-07 DIAGNOSIS — Z5181 Encounter for therapeutic drug level monitoring: Secondary | ICD-10-CM | POA: Diagnosis not present

## 2017-03-07 DIAGNOSIS — Z794 Long term (current) use of insulin: Secondary | ICD-10-CM | POA: Diagnosis not present

## 2017-03-07 DIAGNOSIS — E119 Type 2 diabetes mellitus without complications: Secondary | ICD-10-CM | POA: Diagnosis not present

## 2017-03-07 DIAGNOSIS — Z950 Presence of cardiac pacemaker: Secondary | ICD-10-CM | POA: Diagnosis not present

## 2017-03-07 DIAGNOSIS — I110 Hypertensive heart disease with heart failure: Secondary | ICD-10-CM | POA: Diagnosis not present

## 2017-03-09 DIAGNOSIS — E785 Hyperlipidemia, unspecified: Secondary | ICD-10-CM | POA: Diagnosis not present

## 2017-03-09 DIAGNOSIS — E119 Type 2 diabetes mellitus without complications: Secondary | ICD-10-CM | POA: Diagnosis not present

## 2017-03-09 DIAGNOSIS — Z794 Long term (current) use of insulin: Secondary | ICD-10-CM | POA: Diagnosis not present

## 2017-03-09 DIAGNOSIS — Z888 Allergy status to other drugs, medicaments and biological substances status: Secondary | ICD-10-CM | POA: Diagnosis not present

## 2017-03-09 DIAGNOSIS — M85851 Other specified disorders of bone density and structure, right thigh: Secondary | ICD-10-CM | POA: Diagnosis not present

## 2017-03-09 DIAGNOSIS — Z79899 Other long term (current) drug therapy: Secondary | ICD-10-CM | POA: Diagnosis not present

## 2017-03-09 DIAGNOSIS — I4891 Unspecified atrial fibrillation: Secondary | ICD-10-CM | POA: Diagnosis not present

## 2017-03-09 DIAGNOSIS — Z7901 Long term (current) use of anticoagulants: Secondary | ICD-10-CM | POA: Diagnosis not present

## 2017-03-09 DIAGNOSIS — S72491D Other fracture of lower end of right femur, subsequent encounter for closed fracture with routine healing: Secondary | ICD-10-CM | POA: Diagnosis not present

## 2017-03-09 DIAGNOSIS — I10 Essential (primary) hypertension: Secondary | ICD-10-CM | POA: Diagnosis not present

## 2017-03-10 DIAGNOSIS — I509 Heart failure, unspecified: Secondary | ICD-10-CM | POA: Diagnosis not present

## 2017-03-10 DIAGNOSIS — Z7901 Long term (current) use of anticoagulants: Secondary | ICD-10-CM | POA: Diagnosis not present

## 2017-03-10 DIAGNOSIS — Z96651 Presence of right artificial knee joint: Secondary | ICD-10-CM | POA: Diagnosis not present

## 2017-03-10 DIAGNOSIS — Z794 Long term (current) use of insulin: Secondary | ICD-10-CM | POA: Diagnosis not present

## 2017-03-10 DIAGNOSIS — Z9181 History of falling: Secondary | ICD-10-CM | POA: Diagnosis not present

## 2017-03-10 DIAGNOSIS — Z950 Presence of cardiac pacemaker: Secondary | ICD-10-CM | POA: Diagnosis not present

## 2017-03-10 DIAGNOSIS — R2689 Other abnormalities of gait and mobility: Secondary | ICD-10-CM | POA: Diagnosis not present

## 2017-03-10 DIAGNOSIS — Z5181 Encounter for therapeutic drug level monitoring: Secondary | ICD-10-CM | POA: Diagnosis not present

## 2017-03-10 DIAGNOSIS — I110 Hypertensive heart disease with heart failure: Secondary | ICD-10-CM | POA: Diagnosis not present

## 2017-03-10 DIAGNOSIS — E119 Type 2 diabetes mellitus without complications: Secondary | ICD-10-CM | POA: Diagnosis not present

## 2017-03-11 DIAGNOSIS — I110 Hypertensive heart disease with heart failure: Secondary | ICD-10-CM | POA: Diagnosis not present

## 2017-03-11 DIAGNOSIS — Z794 Long term (current) use of insulin: Secondary | ICD-10-CM | POA: Diagnosis not present

## 2017-03-11 DIAGNOSIS — Z950 Presence of cardiac pacemaker: Secondary | ICD-10-CM | POA: Diagnosis not present

## 2017-03-11 DIAGNOSIS — I509 Heart failure, unspecified: Secondary | ICD-10-CM | POA: Diagnosis not present

## 2017-03-11 DIAGNOSIS — Z96651 Presence of right artificial knee joint: Secondary | ICD-10-CM | POA: Diagnosis not present

## 2017-03-11 DIAGNOSIS — Z9181 History of falling: Secondary | ICD-10-CM | POA: Diagnosis not present

## 2017-03-11 DIAGNOSIS — Z5181 Encounter for therapeutic drug level monitoring: Secondary | ICD-10-CM | POA: Diagnosis not present

## 2017-03-11 DIAGNOSIS — Z7901 Long term (current) use of anticoagulants: Secondary | ICD-10-CM | POA: Diagnosis not present

## 2017-03-11 DIAGNOSIS — R2689 Other abnormalities of gait and mobility: Secondary | ICD-10-CM | POA: Diagnosis not present

## 2017-03-11 DIAGNOSIS — E119 Type 2 diabetes mellitus without complications: Secondary | ICD-10-CM | POA: Diagnosis not present

## 2017-03-14 DIAGNOSIS — Z96651 Presence of right artificial knee joint: Secondary | ICD-10-CM | POA: Diagnosis not present

## 2017-03-14 DIAGNOSIS — I509 Heart failure, unspecified: Secondary | ICD-10-CM | POA: Diagnosis not present

## 2017-03-14 DIAGNOSIS — Z950 Presence of cardiac pacemaker: Secondary | ICD-10-CM | POA: Diagnosis not present

## 2017-03-14 DIAGNOSIS — R2689 Other abnormalities of gait and mobility: Secondary | ICD-10-CM | POA: Diagnosis not present

## 2017-03-14 DIAGNOSIS — Z9181 History of falling: Secondary | ICD-10-CM | POA: Diagnosis not present

## 2017-03-14 DIAGNOSIS — Z5181 Encounter for therapeutic drug level monitoring: Secondary | ICD-10-CM | POA: Diagnosis not present

## 2017-03-14 DIAGNOSIS — I110 Hypertensive heart disease with heart failure: Secondary | ICD-10-CM | POA: Diagnosis not present

## 2017-03-14 DIAGNOSIS — Z7901 Long term (current) use of anticoagulants: Secondary | ICD-10-CM | POA: Diagnosis not present

## 2017-03-14 DIAGNOSIS — Z794 Long term (current) use of insulin: Secondary | ICD-10-CM | POA: Diagnosis not present

## 2017-03-14 DIAGNOSIS — E119 Type 2 diabetes mellitus without complications: Secondary | ICD-10-CM | POA: Diagnosis not present

## 2017-03-16 DIAGNOSIS — R2689 Other abnormalities of gait and mobility: Secondary | ICD-10-CM | POA: Diagnosis not present

## 2017-03-16 DIAGNOSIS — Z9181 History of falling: Secondary | ICD-10-CM | POA: Diagnosis not present

## 2017-03-16 DIAGNOSIS — E119 Type 2 diabetes mellitus without complications: Secondary | ICD-10-CM | POA: Diagnosis not present

## 2017-03-16 DIAGNOSIS — I110 Hypertensive heart disease with heart failure: Secondary | ICD-10-CM | POA: Diagnosis not present

## 2017-03-16 DIAGNOSIS — Z7901 Long term (current) use of anticoagulants: Secondary | ICD-10-CM | POA: Diagnosis not present

## 2017-03-16 DIAGNOSIS — Z950 Presence of cardiac pacemaker: Secondary | ICD-10-CM | POA: Diagnosis not present

## 2017-03-16 DIAGNOSIS — Z5181 Encounter for therapeutic drug level monitoring: Secondary | ICD-10-CM | POA: Diagnosis not present

## 2017-03-16 DIAGNOSIS — Z96651 Presence of right artificial knee joint: Secondary | ICD-10-CM | POA: Diagnosis not present

## 2017-03-16 DIAGNOSIS — Z794 Long term (current) use of insulin: Secondary | ICD-10-CM | POA: Diagnosis not present

## 2017-03-16 DIAGNOSIS — I509 Heart failure, unspecified: Secondary | ICD-10-CM | POA: Diagnosis not present

## 2017-03-17 ENCOUNTER — Ambulatory Visit (INDEPENDENT_AMBULATORY_CARE_PROVIDER_SITE_OTHER): Payer: Medicare Other | Admitting: Pharmacist Clinician (PhC)/ Clinical Pharmacy Specialist

## 2017-03-17 DIAGNOSIS — Z950 Presence of cardiac pacemaker: Secondary | ICD-10-CM | POA: Diagnosis not present

## 2017-03-17 DIAGNOSIS — I509 Heart failure, unspecified: Secondary | ICD-10-CM | POA: Diagnosis not present

## 2017-03-17 DIAGNOSIS — Z794 Long term (current) use of insulin: Secondary | ICD-10-CM | POA: Diagnosis not present

## 2017-03-17 DIAGNOSIS — Z7901 Long term (current) use of anticoagulants: Secondary | ICD-10-CM | POA: Diagnosis not present

## 2017-03-17 DIAGNOSIS — Z5181 Encounter for therapeutic drug level monitoring: Secondary | ICD-10-CM | POA: Diagnosis not present

## 2017-03-17 DIAGNOSIS — R2689 Other abnormalities of gait and mobility: Secondary | ICD-10-CM | POA: Diagnosis not present

## 2017-03-17 DIAGNOSIS — Z9181 History of falling: Secondary | ICD-10-CM | POA: Diagnosis not present

## 2017-03-17 DIAGNOSIS — Z96651 Presence of right artificial knee joint: Secondary | ICD-10-CM | POA: Diagnosis not present

## 2017-03-17 DIAGNOSIS — E119 Type 2 diabetes mellitus without complications: Secondary | ICD-10-CM | POA: Diagnosis not present

## 2017-03-17 DIAGNOSIS — I110 Hypertensive heart disease with heart failure: Secondary | ICD-10-CM | POA: Diagnosis not present

## 2017-03-17 LAB — POCT INR: INR: 1.8

## 2017-03-18 DIAGNOSIS — E119 Type 2 diabetes mellitus without complications: Secondary | ICD-10-CM | POA: Diagnosis not present

## 2017-03-18 DIAGNOSIS — Z96651 Presence of right artificial knee joint: Secondary | ICD-10-CM | POA: Diagnosis not present

## 2017-03-18 DIAGNOSIS — Z7901 Long term (current) use of anticoagulants: Secondary | ICD-10-CM | POA: Diagnosis not present

## 2017-03-18 DIAGNOSIS — Z9181 History of falling: Secondary | ICD-10-CM | POA: Diagnosis not present

## 2017-03-18 DIAGNOSIS — I509 Heart failure, unspecified: Secondary | ICD-10-CM | POA: Diagnosis not present

## 2017-03-18 DIAGNOSIS — Z950 Presence of cardiac pacemaker: Secondary | ICD-10-CM | POA: Diagnosis not present

## 2017-03-18 DIAGNOSIS — R2689 Other abnormalities of gait and mobility: Secondary | ICD-10-CM | POA: Diagnosis not present

## 2017-03-18 DIAGNOSIS — Z5181 Encounter for therapeutic drug level monitoring: Secondary | ICD-10-CM | POA: Diagnosis not present

## 2017-03-18 DIAGNOSIS — I110 Hypertensive heart disease with heart failure: Secondary | ICD-10-CM | POA: Diagnosis not present

## 2017-03-18 DIAGNOSIS — Z794 Long term (current) use of insulin: Secondary | ICD-10-CM | POA: Diagnosis not present

## 2017-03-21 DIAGNOSIS — I509 Heart failure, unspecified: Secondary | ICD-10-CM | POA: Diagnosis not present

## 2017-03-21 DIAGNOSIS — E119 Type 2 diabetes mellitus without complications: Secondary | ICD-10-CM | POA: Diagnosis not present

## 2017-03-21 DIAGNOSIS — R2689 Other abnormalities of gait and mobility: Secondary | ICD-10-CM | POA: Diagnosis not present

## 2017-03-21 DIAGNOSIS — Z950 Presence of cardiac pacemaker: Secondary | ICD-10-CM | POA: Diagnosis not present

## 2017-03-21 DIAGNOSIS — Z5181 Encounter for therapeutic drug level monitoring: Secondary | ICD-10-CM | POA: Diagnosis not present

## 2017-03-21 DIAGNOSIS — Z9181 History of falling: Secondary | ICD-10-CM | POA: Diagnosis not present

## 2017-03-21 DIAGNOSIS — Z96651 Presence of right artificial knee joint: Secondary | ICD-10-CM | POA: Diagnosis not present

## 2017-03-21 DIAGNOSIS — I110 Hypertensive heart disease with heart failure: Secondary | ICD-10-CM | POA: Diagnosis not present

## 2017-03-21 DIAGNOSIS — Z7901 Long term (current) use of anticoagulants: Secondary | ICD-10-CM | POA: Diagnosis not present

## 2017-03-21 DIAGNOSIS — Z794 Long term (current) use of insulin: Secondary | ICD-10-CM | POA: Diagnosis not present

## 2017-03-23 DIAGNOSIS — E119 Type 2 diabetes mellitus without complications: Secondary | ICD-10-CM | POA: Diagnosis not present

## 2017-03-23 DIAGNOSIS — I509 Heart failure, unspecified: Secondary | ICD-10-CM | POA: Diagnosis not present

## 2017-03-23 DIAGNOSIS — Z5181 Encounter for therapeutic drug level monitoring: Secondary | ICD-10-CM | POA: Diagnosis not present

## 2017-03-23 DIAGNOSIS — Z794 Long term (current) use of insulin: Secondary | ICD-10-CM | POA: Diagnosis not present

## 2017-03-23 DIAGNOSIS — Z950 Presence of cardiac pacemaker: Secondary | ICD-10-CM | POA: Diagnosis not present

## 2017-03-23 DIAGNOSIS — Z7901 Long term (current) use of anticoagulants: Secondary | ICD-10-CM | POA: Diagnosis not present

## 2017-03-23 DIAGNOSIS — Z9181 History of falling: Secondary | ICD-10-CM | POA: Diagnosis not present

## 2017-03-23 DIAGNOSIS — Z96651 Presence of right artificial knee joint: Secondary | ICD-10-CM | POA: Diagnosis not present

## 2017-03-23 DIAGNOSIS — I110 Hypertensive heart disease with heart failure: Secondary | ICD-10-CM | POA: Diagnosis not present

## 2017-03-23 DIAGNOSIS — R2689 Other abnormalities of gait and mobility: Secondary | ICD-10-CM | POA: Diagnosis not present

## 2017-03-25 DIAGNOSIS — Z794 Long term (current) use of insulin: Secondary | ICD-10-CM | POA: Diagnosis not present

## 2017-03-25 DIAGNOSIS — I509 Heart failure, unspecified: Secondary | ICD-10-CM | POA: Diagnosis not present

## 2017-03-25 DIAGNOSIS — E119 Type 2 diabetes mellitus without complications: Secondary | ICD-10-CM | POA: Diagnosis not present

## 2017-03-25 DIAGNOSIS — Z96651 Presence of right artificial knee joint: Secondary | ICD-10-CM | POA: Diagnosis not present

## 2017-03-25 DIAGNOSIS — Z950 Presence of cardiac pacemaker: Secondary | ICD-10-CM | POA: Diagnosis not present

## 2017-03-25 DIAGNOSIS — I110 Hypertensive heart disease with heart failure: Secondary | ICD-10-CM | POA: Diagnosis not present

## 2017-03-25 DIAGNOSIS — R2689 Other abnormalities of gait and mobility: Secondary | ICD-10-CM | POA: Diagnosis not present

## 2017-03-25 DIAGNOSIS — Z9181 History of falling: Secondary | ICD-10-CM | POA: Diagnosis not present

## 2017-03-25 DIAGNOSIS — Z5181 Encounter for therapeutic drug level monitoring: Secondary | ICD-10-CM | POA: Diagnosis not present

## 2017-03-25 DIAGNOSIS — Z7901 Long term (current) use of anticoagulants: Secondary | ICD-10-CM | POA: Diagnosis not present

## 2017-03-28 DIAGNOSIS — R2689 Other abnormalities of gait and mobility: Secondary | ICD-10-CM | POA: Diagnosis not present

## 2017-03-28 DIAGNOSIS — E119 Type 2 diabetes mellitus without complications: Secondary | ICD-10-CM | POA: Diagnosis not present

## 2017-03-28 DIAGNOSIS — Z7901 Long term (current) use of anticoagulants: Secondary | ICD-10-CM | POA: Diagnosis not present

## 2017-03-28 DIAGNOSIS — Z9181 History of falling: Secondary | ICD-10-CM | POA: Diagnosis not present

## 2017-03-28 DIAGNOSIS — I509 Heart failure, unspecified: Secondary | ICD-10-CM | POA: Diagnosis not present

## 2017-03-28 DIAGNOSIS — Z794 Long term (current) use of insulin: Secondary | ICD-10-CM | POA: Diagnosis not present

## 2017-03-28 DIAGNOSIS — Z950 Presence of cardiac pacemaker: Secondary | ICD-10-CM | POA: Diagnosis not present

## 2017-03-28 DIAGNOSIS — I110 Hypertensive heart disease with heart failure: Secondary | ICD-10-CM | POA: Diagnosis not present

## 2017-03-28 DIAGNOSIS — Z5181 Encounter for therapeutic drug level monitoring: Secondary | ICD-10-CM | POA: Diagnosis not present

## 2017-03-28 DIAGNOSIS — Z96651 Presence of right artificial knee joint: Secondary | ICD-10-CM | POA: Diagnosis not present

## 2017-03-30 DIAGNOSIS — Z5181 Encounter for therapeutic drug level monitoring: Secondary | ICD-10-CM | POA: Diagnosis not present

## 2017-03-30 DIAGNOSIS — R2689 Other abnormalities of gait and mobility: Secondary | ICD-10-CM | POA: Diagnosis not present

## 2017-03-30 DIAGNOSIS — Z9181 History of falling: Secondary | ICD-10-CM | POA: Diagnosis not present

## 2017-03-30 DIAGNOSIS — Z96651 Presence of right artificial knee joint: Secondary | ICD-10-CM | POA: Diagnosis not present

## 2017-03-30 DIAGNOSIS — Z7901 Long term (current) use of anticoagulants: Secondary | ICD-10-CM | POA: Diagnosis not present

## 2017-03-30 DIAGNOSIS — E119 Type 2 diabetes mellitus without complications: Secondary | ICD-10-CM | POA: Diagnosis not present

## 2017-03-30 DIAGNOSIS — Z794 Long term (current) use of insulin: Secondary | ICD-10-CM | POA: Diagnosis not present

## 2017-03-30 DIAGNOSIS — I110 Hypertensive heart disease with heart failure: Secondary | ICD-10-CM | POA: Diagnosis not present

## 2017-03-30 DIAGNOSIS — Z950 Presence of cardiac pacemaker: Secondary | ICD-10-CM | POA: Diagnosis not present

## 2017-03-30 DIAGNOSIS — I509 Heart failure, unspecified: Secondary | ICD-10-CM | POA: Diagnosis not present

## 2017-03-31 ENCOUNTER — Ambulatory Visit (INDEPENDENT_AMBULATORY_CARE_PROVIDER_SITE_OTHER): Payer: Medicare Other | Admitting: Pharmacist Clinician (PhC)/ Clinical Pharmacy Specialist

## 2017-03-31 DIAGNOSIS — I509 Heart failure, unspecified: Secondary | ICD-10-CM | POA: Diagnosis not present

## 2017-03-31 DIAGNOSIS — Z794 Long term (current) use of insulin: Secondary | ICD-10-CM | POA: Diagnosis not present

## 2017-03-31 DIAGNOSIS — Z7901 Long term (current) use of anticoagulants: Secondary | ICD-10-CM | POA: Diagnosis not present

## 2017-03-31 DIAGNOSIS — I110 Hypertensive heart disease with heart failure: Secondary | ICD-10-CM | POA: Diagnosis not present

## 2017-03-31 DIAGNOSIS — E119 Type 2 diabetes mellitus without complications: Secondary | ICD-10-CM | POA: Diagnosis not present

## 2017-03-31 DIAGNOSIS — Z96651 Presence of right artificial knee joint: Secondary | ICD-10-CM | POA: Diagnosis not present

## 2017-03-31 DIAGNOSIS — Z950 Presence of cardiac pacemaker: Secondary | ICD-10-CM | POA: Diagnosis not present

## 2017-03-31 DIAGNOSIS — Z9181 History of falling: Secondary | ICD-10-CM | POA: Diagnosis not present

## 2017-03-31 DIAGNOSIS — R2689 Other abnormalities of gait and mobility: Secondary | ICD-10-CM | POA: Diagnosis not present

## 2017-03-31 DIAGNOSIS — Z5181 Encounter for therapeutic drug level monitoring: Secondary | ICD-10-CM | POA: Diagnosis not present

## 2017-03-31 LAB — POCT INR: INR: 1.3

## 2017-04-04 ENCOUNTER — Telehealth: Payer: Self-pay | Admitting: *Deleted

## 2017-04-04 DIAGNOSIS — Z7901 Long term (current) use of anticoagulants: Secondary | ICD-10-CM | POA: Diagnosis not present

## 2017-04-04 DIAGNOSIS — E119 Type 2 diabetes mellitus without complications: Secondary | ICD-10-CM | POA: Diagnosis not present

## 2017-04-04 DIAGNOSIS — Z96651 Presence of right artificial knee joint: Secondary | ICD-10-CM | POA: Diagnosis not present

## 2017-04-04 DIAGNOSIS — R2689 Other abnormalities of gait and mobility: Secondary | ICD-10-CM | POA: Diagnosis not present

## 2017-04-04 DIAGNOSIS — Z5181 Encounter for therapeutic drug level monitoring: Secondary | ICD-10-CM | POA: Diagnosis not present

## 2017-04-04 DIAGNOSIS — Z794 Long term (current) use of insulin: Secondary | ICD-10-CM | POA: Diagnosis not present

## 2017-04-04 DIAGNOSIS — Z9181 History of falling: Secondary | ICD-10-CM | POA: Diagnosis not present

## 2017-04-04 DIAGNOSIS — Z950 Presence of cardiac pacemaker: Secondary | ICD-10-CM | POA: Diagnosis not present

## 2017-04-04 DIAGNOSIS — I110 Hypertensive heart disease with heart failure: Secondary | ICD-10-CM | POA: Diagnosis not present

## 2017-04-04 DIAGNOSIS — I509 Heart failure, unspecified: Secondary | ICD-10-CM | POA: Diagnosis not present

## 2017-04-04 NOTE — Telephone Encounter (Signed)
Patient' daughter called to let you know that patient is now in the doughnut hole and cannot afford her diabetic mediaiton .( I am assuming the Januvia since she tried to spell it and referred to it as a pill and this is the only DM med on her list in pill form.) For the next nine months she will not be able to to afford this. She wanted to know what you suggested or if there was something much cheaper that was comparable.

## 2017-04-05 NOTE — Telephone Encounter (Signed)
Do you think we would be able to call the pharmaceutical representative and see if we can at least get a months worth of samples. She tends to have frequent hypoglycemic events and so this medication works really well for her. If she's also in the doughnut hole then she can go ahead and try to apply for financial assistance directly through the company. Her daughter can go online and find the form sent to her portion and then dropped him off and we can complete her portion and see if she qualifies for assistance. It's definitely worth trying.

## 2017-04-06 DIAGNOSIS — I110 Hypertensive heart disease with heart failure: Secondary | ICD-10-CM | POA: Diagnosis not present

## 2017-04-06 DIAGNOSIS — Z9181 History of falling: Secondary | ICD-10-CM | POA: Diagnosis not present

## 2017-04-06 DIAGNOSIS — Z950 Presence of cardiac pacemaker: Secondary | ICD-10-CM | POA: Diagnosis not present

## 2017-04-06 DIAGNOSIS — Z5181 Encounter for therapeutic drug level monitoring: Secondary | ICD-10-CM | POA: Diagnosis not present

## 2017-04-06 DIAGNOSIS — I509 Heart failure, unspecified: Secondary | ICD-10-CM | POA: Diagnosis not present

## 2017-04-06 DIAGNOSIS — E119 Type 2 diabetes mellitus without complications: Secondary | ICD-10-CM | POA: Diagnosis not present

## 2017-04-06 DIAGNOSIS — Z7901 Long term (current) use of anticoagulants: Secondary | ICD-10-CM | POA: Diagnosis not present

## 2017-04-06 DIAGNOSIS — Z96651 Presence of right artificial knee joint: Secondary | ICD-10-CM | POA: Diagnosis not present

## 2017-04-06 DIAGNOSIS — Z794 Long term (current) use of insulin: Secondary | ICD-10-CM | POA: Diagnosis not present

## 2017-04-06 DIAGNOSIS — R2689 Other abnormalities of gait and mobility: Secondary | ICD-10-CM | POA: Diagnosis not present

## 2017-04-06 NOTE — Telephone Encounter (Signed)
Patient's daughter advised. Will route to [place] to see if the rep has been contacted.

## 2017-04-06 NOTE — Telephone Encounter (Signed)
[name] is getting in touch with the rep. Called to speak with the daughter [name], but she was out. Spoke with patient but will call back to speak with daughter

## 2017-04-07 ENCOUNTER — Ambulatory Visit (INDEPENDENT_AMBULATORY_CARE_PROVIDER_SITE_OTHER): Payer: Self-pay | Admitting: Pharmacist

## 2017-04-07 DIAGNOSIS — Z9181 History of falling: Secondary | ICD-10-CM | POA: Diagnosis not present

## 2017-04-07 DIAGNOSIS — Z794 Long term (current) use of insulin: Secondary | ICD-10-CM | POA: Diagnosis not present

## 2017-04-07 DIAGNOSIS — Z7901 Long term (current) use of anticoagulants: Secondary | ICD-10-CM | POA: Diagnosis not present

## 2017-04-07 DIAGNOSIS — Z5181 Encounter for therapeutic drug level monitoring: Secondary | ICD-10-CM | POA: Diagnosis not present

## 2017-04-07 DIAGNOSIS — E119 Type 2 diabetes mellitus without complications: Secondary | ICD-10-CM | POA: Diagnosis not present

## 2017-04-07 DIAGNOSIS — Z950 Presence of cardiac pacemaker: Secondary | ICD-10-CM | POA: Diagnosis not present

## 2017-04-07 DIAGNOSIS — I110 Hypertensive heart disease with heart failure: Secondary | ICD-10-CM | POA: Diagnosis not present

## 2017-04-07 DIAGNOSIS — I509 Heart failure, unspecified: Secondary | ICD-10-CM | POA: Diagnosis not present

## 2017-04-07 DIAGNOSIS — Z96651 Presence of right artificial knee joint: Secondary | ICD-10-CM | POA: Diagnosis not present

## 2017-04-07 DIAGNOSIS — R2689 Other abnormalities of gait and mobility: Secondary | ICD-10-CM | POA: Diagnosis not present

## 2017-04-07 LAB — PROTIME-INR: INR: 2.9 — AB (ref <=1.1)

## 2017-04-07 NOTE — Telephone Encounter (Signed)
I have faxed the sheet the drug rep sent [place] to get the patient her samples. They said it can take up to a week to get here. Thanks

## 2017-04-08 DIAGNOSIS — Z950 Presence of cardiac pacemaker: Secondary | ICD-10-CM | POA: Diagnosis not present

## 2017-04-08 DIAGNOSIS — Z794 Long term (current) use of insulin: Secondary | ICD-10-CM | POA: Diagnosis not present

## 2017-04-08 DIAGNOSIS — R2689 Other abnormalities of gait and mobility: Secondary | ICD-10-CM | POA: Diagnosis not present

## 2017-04-08 DIAGNOSIS — I110 Hypertensive heart disease with heart failure: Secondary | ICD-10-CM | POA: Diagnosis not present

## 2017-04-08 DIAGNOSIS — Z96651 Presence of right artificial knee joint: Secondary | ICD-10-CM | POA: Diagnosis not present

## 2017-04-08 DIAGNOSIS — Z9181 History of falling: Secondary | ICD-10-CM | POA: Diagnosis not present

## 2017-04-08 DIAGNOSIS — Z5181 Encounter for therapeutic drug level monitoring: Secondary | ICD-10-CM | POA: Diagnosis not present

## 2017-04-08 DIAGNOSIS — Z7901 Long term (current) use of anticoagulants: Secondary | ICD-10-CM | POA: Diagnosis not present

## 2017-04-08 DIAGNOSIS — E119 Type 2 diabetes mellitus without complications: Secondary | ICD-10-CM | POA: Diagnosis not present

## 2017-04-08 DIAGNOSIS — I509 Heart failure, unspecified: Secondary | ICD-10-CM | POA: Diagnosis not present

## 2017-04-11 DIAGNOSIS — E119 Type 2 diabetes mellitus without complications: Secondary | ICD-10-CM | POA: Diagnosis not present

## 2017-04-11 DIAGNOSIS — Z7901 Long term (current) use of anticoagulants: Secondary | ICD-10-CM | POA: Diagnosis not present

## 2017-04-11 DIAGNOSIS — Z5181 Encounter for therapeutic drug level monitoring: Secondary | ICD-10-CM | POA: Diagnosis not present

## 2017-04-11 DIAGNOSIS — I110 Hypertensive heart disease with heart failure: Secondary | ICD-10-CM | POA: Diagnosis not present

## 2017-04-11 DIAGNOSIS — R2689 Other abnormalities of gait and mobility: Secondary | ICD-10-CM | POA: Diagnosis not present

## 2017-04-11 DIAGNOSIS — I509 Heart failure, unspecified: Secondary | ICD-10-CM | POA: Diagnosis not present

## 2017-04-13 ENCOUNTER — Telehealth: Payer: Self-pay

## 2017-04-13 DIAGNOSIS — I110 Hypertensive heart disease with heart failure: Secondary | ICD-10-CM | POA: Diagnosis not present

## 2017-04-13 DIAGNOSIS — Z7901 Long term (current) use of anticoagulants: Secondary | ICD-10-CM | POA: Diagnosis not present

## 2017-04-13 DIAGNOSIS — I509 Heart failure, unspecified: Secondary | ICD-10-CM | POA: Diagnosis not present

## 2017-04-13 DIAGNOSIS — Z5181 Encounter for therapeutic drug level monitoring: Secondary | ICD-10-CM | POA: Diagnosis not present

## 2017-04-13 DIAGNOSIS — R2689 Other abnormalities of gait and mobility: Secondary | ICD-10-CM | POA: Diagnosis not present

## 2017-04-13 DIAGNOSIS — E119 Type 2 diabetes mellitus without complications: Secondary | ICD-10-CM | POA: Diagnosis not present

## 2017-04-13 NOTE — Telephone Encounter (Signed)
Ok to continue

## 2017-04-13 NOTE — Telephone Encounter (Signed)
Yes, thankk you

## 2017-04-13 NOTE — Telephone Encounter (Signed)
Home health called wanting a verbal order to continue PT. Please advise.

## 2017-04-13 NOTE — Telephone Encounter (Signed)
[name] at [phone] and gave VO.

## 2017-04-13 NOTE — Telephone Encounter (Signed)
FYI--[name] from [facility] called and needed ok to continue home health aide and to continue doing INR.  Oked to continue.

## 2017-04-14 ENCOUNTER — Other Ambulatory Visit: Payer: Self-pay | Admitting: Family Medicine

## 2017-04-15 DIAGNOSIS — Z5181 Encounter for therapeutic drug level monitoring: Secondary | ICD-10-CM | POA: Diagnosis not present

## 2017-04-15 DIAGNOSIS — I110 Hypertensive heart disease with heart failure: Secondary | ICD-10-CM | POA: Diagnosis not present

## 2017-04-15 DIAGNOSIS — R2689 Other abnormalities of gait and mobility: Secondary | ICD-10-CM | POA: Diagnosis not present

## 2017-04-15 DIAGNOSIS — Z7901 Long term (current) use of anticoagulants: Secondary | ICD-10-CM | POA: Diagnosis not present

## 2017-04-15 DIAGNOSIS — E119 Type 2 diabetes mellitus without complications: Secondary | ICD-10-CM | POA: Diagnosis not present

## 2017-04-15 DIAGNOSIS — I509 Heart failure, unspecified: Secondary | ICD-10-CM | POA: Diagnosis not present

## 2017-04-18 DIAGNOSIS — R2689 Other abnormalities of gait and mobility: Secondary | ICD-10-CM | POA: Diagnosis not present

## 2017-04-18 DIAGNOSIS — I509 Heart failure, unspecified: Secondary | ICD-10-CM | POA: Diagnosis not present

## 2017-04-18 DIAGNOSIS — Z7901 Long term (current) use of anticoagulants: Secondary | ICD-10-CM | POA: Diagnosis not present

## 2017-04-18 DIAGNOSIS — Z5181 Encounter for therapeutic drug level monitoring: Secondary | ICD-10-CM | POA: Diagnosis not present

## 2017-04-18 DIAGNOSIS — I110 Hypertensive heart disease with heart failure: Secondary | ICD-10-CM | POA: Diagnosis not present

## 2017-04-18 DIAGNOSIS — E119 Type 2 diabetes mellitus without complications: Secondary | ICD-10-CM | POA: Diagnosis not present

## 2017-04-19 ENCOUNTER — Ambulatory Visit (INDEPENDENT_AMBULATORY_CARE_PROVIDER_SITE_OTHER): Payer: Self-pay | Admitting: Pharmacist

## 2017-04-19 DIAGNOSIS — I509 Heart failure, unspecified: Secondary | ICD-10-CM | POA: Diagnosis not present

## 2017-04-19 DIAGNOSIS — I110 Hypertensive heart disease with heart failure: Secondary | ICD-10-CM | POA: Diagnosis not present

## 2017-04-19 DIAGNOSIS — Z7901 Long term (current) use of anticoagulants: Secondary | ICD-10-CM

## 2017-04-19 DIAGNOSIS — E119 Type 2 diabetes mellitus without complications: Secondary | ICD-10-CM | POA: Diagnosis not present

## 2017-04-19 DIAGNOSIS — R2689 Other abnormalities of gait and mobility: Secondary | ICD-10-CM | POA: Diagnosis not present

## 2017-04-19 DIAGNOSIS — Z5181 Encounter for therapeutic drug level monitoring: Secondary | ICD-10-CM | POA: Diagnosis not present

## 2017-04-19 LAB — PROTIME-INR: INR: 3.1 — AB (ref <=1.1)

## 2017-04-19 NOTE — Telephone Encounter (Signed)
Called to check to see if patient received januvia samples and she did pick them up the other day.

## 2017-04-20 DIAGNOSIS — Z5181 Encounter for therapeutic drug level monitoring: Secondary | ICD-10-CM | POA: Diagnosis not present

## 2017-04-20 DIAGNOSIS — Z7901 Long term (current) use of anticoagulants: Secondary | ICD-10-CM | POA: Diagnosis not present

## 2017-04-20 DIAGNOSIS — R2689 Other abnormalities of gait and mobility: Secondary | ICD-10-CM | POA: Diagnosis not present

## 2017-04-20 DIAGNOSIS — E119 Type 2 diabetes mellitus without complications: Secondary | ICD-10-CM | POA: Diagnosis not present

## 2017-04-20 DIAGNOSIS — I110 Hypertensive heart disease with heart failure: Secondary | ICD-10-CM | POA: Diagnosis not present

## 2017-04-20 DIAGNOSIS — I509 Heart failure, unspecified: Secondary | ICD-10-CM | POA: Diagnosis not present

## 2017-04-22 DIAGNOSIS — Z7901 Long term (current) use of anticoagulants: Secondary | ICD-10-CM | POA: Diagnosis not present

## 2017-04-22 DIAGNOSIS — E119 Type 2 diabetes mellitus without complications: Secondary | ICD-10-CM | POA: Diagnosis not present

## 2017-04-22 DIAGNOSIS — I110 Hypertensive heart disease with heart failure: Secondary | ICD-10-CM | POA: Diagnosis not present

## 2017-04-22 DIAGNOSIS — I509 Heart failure, unspecified: Secondary | ICD-10-CM | POA: Diagnosis not present

## 2017-04-22 DIAGNOSIS — R2689 Other abnormalities of gait and mobility: Secondary | ICD-10-CM | POA: Diagnosis not present

## 2017-04-22 DIAGNOSIS — Z5181 Encounter for therapeutic drug level monitoring: Secondary | ICD-10-CM | POA: Diagnosis not present

## 2017-04-25 DIAGNOSIS — E119 Type 2 diabetes mellitus without complications: Secondary | ICD-10-CM | POA: Diagnosis not present

## 2017-04-25 DIAGNOSIS — Z5181 Encounter for therapeutic drug level monitoring: Secondary | ICD-10-CM | POA: Diagnosis not present

## 2017-04-25 DIAGNOSIS — I110 Hypertensive heart disease with heart failure: Secondary | ICD-10-CM | POA: Diagnosis not present

## 2017-04-25 DIAGNOSIS — Z7901 Long term (current) use of anticoagulants: Secondary | ICD-10-CM | POA: Diagnosis not present

## 2017-04-25 DIAGNOSIS — I509 Heart failure, unspecified: Secondary | ICD-10-CM | POA: Diagnosis not present

## 2017-04-25 DIAGNOSIS — R2689 Other abnormalities of gait and mobility: Secondary | ICD-10-CM | POA: Diagnosis not present

## 2017-04-27 DIAGNOSIS — R2689 Other abnormalities of gait and mobility: Secondary | ICD-10-CM | POA: Diagnosis not present

## 2017-04-27 DIAGNOSIS — E119 Type 2 diabetes mellitus without complications: Secondary | ICD-10-CM | POA: Diagnosis not present

## 2017-04-27 DIAGNOSIS — I110 Hypertensive heart disease with heart failure: Secondary | ICD-10-CM | POA: Diagnosis not present

## 2017-04-27 DIAGNOSIS — Z7901 Long term (current) use of anticoagulants: Secondary | ICD-10-CM | POA: Diagnosis not present

## 2017-04-27 DIAGNOSIS — I509 Heart failure, unspecified: Secondary | ICD-10-CM | POA: Diagnosis not present

## 2017-04-27 DIAGNOSIS — Z5181 Encounter for therapeutic drug level monitoring: Secondary | ICD-10-CM | POA: Diagnosis not present

## 2017-04-29 DIAGNOSIS — Z5181 Encounter for therapeutic drug level monitoring: Secondary | ICD-10-CM | POA: Diagnosis not present

## 2017-04-29 DIAGNOSIS — Z7901 Long term (current) use of anticoagulants: Secondary | ICD-10-CM | POA: Diagnosis not present

## 2017-04-29 DIAGNOSIS — R2689 Other abnormalities of gait and mobility: Secondary | ICD-10-CM | POA: Diagnosis not present

## 2017-04-29 DIAGNOSIS — I110 Hypertensive heart disease with heart failure: Secondary | ICD-10-CM | POA: Diagnosis not present

## 2017-04-29 DIAGNOSIS — I509 Heart failure, unspecified: Secondary | ICD-10-CM | POA: Diagnosis not present

## 2017-04-29 DIAGNOSIS — E119 Type 2 diabetes mellitus without complications: Secondary | ICD-10-CM | POA: Diagnosis not present

## 2017-05-02 DIAGNOSIS — R2689 Other abnormalities of gait and mobility: Secondary | ICD-10-CM | POA: Diagnosis not present

## 2017-05-02 DIAGNOSIS — I110 Hypertensive heart disease with heart failure: Secondary | ICD-10-CM | POA: Diagnosis not present

## 2017-05-02 DIAGNOSIS — I509 Heart failure, unspecified: Secondary | ICD-10-CM | POA: Diagnosis not present

## 2017-05-02 DIAGNOSIS — Z7901 Long term (current) use of anticoagulants: Secondary | ICD-10-CM | POA: Diagnosis not present

## 2017-05-02 DIAGNOSIS — E119 Type 2 diabetes mellitus without complications: Secondary | ICD-10-CM | POA: Diagnosis not present

## 2017-05-02 DIAGNOSIS — Z5181 Encounter for therapeutic drug level monitoring: Secondary | ICD-10-CM | POA: Diagnosis not present

## 2017-05-03 ENCOUNTER — Ambulatory Visit (INDEPENDENT_AMBULATORY_CARE_PROVIDER_SITE_OTHER): Payer: Medicare Other | Admitting: Pharmacist

## 2017-05-03 DIAGNOSIS — Z7901 Long term (current) use of anticoagulants: Secondary | ICD-10-CM | POA: Diagnosis not present

## 2017-05-03 DIAGNOSIS — R2689 Other abnormalities of gait and mobility: Secondary | ICD-10-CM | POA: Diagnosis not present

## 2017-05-03 DIAGNOSIS — I509 Heart failure, unspecified: Secondary | ICD-10-CM | POA: Diagnosis not present

## 2017-05-03 DIAGNOSIS — Z5181 Encounter for therapeutic drug level monitoring: Secondary | ICD-10-CM | POA: Diagnosis not present

## 2017-05-03 DIAGNOSIS — I110 Hypertensive heart disease with heart failure: Secondary | ICD-10-CM | POA: Diagnosis not present

## 2017-05-03 DIAGNOSIS — E119 Type 2 diabetes mellitus without complications: Secondary | ICD-10-CM | POA: Diagnosis not present

## 2017-05-03 LAB — PROTIME-INR: INR: 3.1 — AB (ref <=1.1)

## 2017-05-04 DIAGNOSIS — I110 Hypertensive heart disease with heart failure: Secondary | ICD-10-CM | POA: Diagnosis not present

## 2017-05-04 DIAGNOSIS — E119 Type 2 diabetes mellitus without complications: Secondary | ICD-10-CM | POA: Diagnosis not present

## 2017-05-04 DIAGNOSIS — Z7901 Long term (current) use of anticoagulants: Secondary | ICD-10-CM | POA: Diagnosis not present

## 2017-05-04 DIAGNOSIS — R2689 Other abnormalities of gait and mobility: Secondary | ICD-10-CM | POA: Diagnosis not present

## 2017-05-04 DIAGNOSIS — I509 Heart failure, unspecified: Secondary | ICD-10-CM | POA: Diagnosis not present

## 2017-05-04 DIAGNOSIS — Z5181 Encounter for therapeutic drug level monitoring: Secondary | ICD-10-CM | POA: Diagnosis not present

## 2017-05-06 DIAGNOSIS — Z7901 Long term (current) use of anticoagulants: Secondary | ICD-10-CM | POA: Diagnosis not present

## 2017-05-06 DIAGNOSIS — R2689 Other abnormalities of gait and mobility: Secondary | ICD-10-CM | POA: Diagnosis not present

## 2017-05-06 DIAGNOSIS — E119 Type 2 diabetes mellitus without complications: Secondary | ICD-10-CM | POA: Diagnosis not present

## 2017-05-06 DIAGNOSIS — I110 Hypertensive heart disease with heart failure: Secondary | ICD-10-CM | POA: Diagnosis not present

## 2017-05-06 DIAGNOSIS — I509 Heart failure, unspecified: Secondary | ICD-10-CM | POA: Diagnosis not present

## 2017-05-06 DIAGNOSIS — Z5181 Encounter for therapeutic drug level monitoring: Secondary | ICD-10-CM | POA: Diagnosis not present

## 2017-05-09 DIAGNOSIS — Z5181 Encounter for therapeutic drug level monitoring: Secondary | ICD-10-CM | POA: Diagnosis not present

## 2017-05-09 DIAGNOSIS — I110 Hypertensive heart disease with heart failure: Secondary | ICD-10-CM | POA: Diagnosis not present

## 2017-05-09 DIAGNOSIS — I509 Heart failure, unspecified: Secondary | ICD-10-CM | POA: Diagnosis not present

## 2017-05-09 DIAGNOSIS — R2689 Other abnormalities of gait and mobility: Secondary | ICD-10-CM | POA: Diagnosis not present

## 2017-05-09 DIAGNOSIS — Z7901 Long term (current) use of anticoagulants: Secondary | ICD-10-CM | POA: Diagnosis not present

## 2017-05-09 DIAGNOSIS — E119 Type 2 diabetes mellitus without complications: Secondary | ICD-10-CM | POA: Diagnosis not present

## 2017-05-10 ENCOUNTER — Ambulatory Visit (INDEPENDENT_AMBULATORY_CARE_PROVIDER_SITE_OTHER): Payer: Medicare Other | Admitting: Pharmacist Clinician (PhC)/ Clinical Pharmacy Specialist

## 2017-05-10 DIAGNOSIS — R2689 Other abnormalities of gait and mobility: Secondary | ICD-10-CM | POA: Diagnosis not present

## 2017-05-10 DIAGNOSIS — Z7901 Long term (current) use of anticoagulants: Secondary | ICD-10-CM | POA: Diagnosis not present

## 2017-05-10 DIAGNOSIS — I509 Heart failure, unspecified: Secondary | ICD-10-CM | POA: Diagnosis not present

## 2017-05-10 DIAGNOSIS — Z5181 Encounter for therapeutic drug level monitoring: Secondary | ICD-10-CM | POA: Diagnosis not present

## 2017-05-10 DIAGNOSIS — I110 Hypertensive heart disease with heart failure: Secondary | ICD-10-CM | POA: Diagnosis not present

## 2017-05-10 DIAGNOSIS — E119 Type 2 diabetes mellitus without complications: Secondary | ICD-10-CM | POA: Diagnosis not present

## 2017-05-10 LAB — POCT INR: INR: 3

## 2017-05-11 DIAGNOSIS — Z7901 Long term (current) use of anticoagulants: Secondary | ICD-10-CM | POA: Diagnosis not present

## 2017-05-11 DIAGNOSIS — I110 Hypertensive heart disease with heart failure: Secondary | ICD-10-CM | POA: Diagnosis not present

## 2017-05-11 DIAGNOSIS — E119 Type 2 diabetes mellitus without complications: Secondary | ICD-10-CM | POA: Diagnosis not present

## 2017-05-11 DIAGNOSIS — Z5181 Encounter for therapeutic drug level monitoring: Secondary | ICD-10-CM | POA: Diagnosis not present

## 2017-05-11 DIAGNOSIS — I10 Essential (primary) hypertension: Secondary | ICD-10-CM | POA: Diagnosis not present

## 2017-05-11 DIAGNOSIS — R2689 Other abnormalities of gait and mobility: Secondary | ICD-10-CM | POA: Diagnosis not present

## 2017-05-11 DIAGNOSIS — I509 Heart failure, unspecified: Secondary | ICD-10-CM | POA: Diagnosis not present

## 2017-05-13 DIAGNOSIS — I110 Hypertensive heart disease with heart failure: Secondary | ICD-10-CM | POA: Diagnosis not present

## 2017-05-13 DIAGNOSIS — I509 Heart failure, unspecified: Secondary | ICD-10-CM | POA: Diagnosis not present

## 2017-05-13 DIAGNOSIS — Z5181 Encounter for therapeutic drug level monitoring: Secondary | ICD-10-CM | POA: Diagnosis not present

## 2017-05-13 DIAGNOSIS — R2689 Other abnormalities of gait and mobility: Secondary | ICD-10-CM | POA: Diagnosis not present

## 2017-05-13 DIAGNOSIS — Z7901 Long term (current) use of anticoagulants: Secondary | ICD-10-CM | POA: Diagnosis not present

## 2017-05-13 DIAGNOSIS — E119 Type 2 diabetes mellitus without complications: Secondary | ICD-10-CM | POA: Diagnosis not present

## 2017-05-16 DIAGNOSIS — I509 Heart failure, unspecified: Secondary | ICD-10-CM | POA: Diagnosis not present

## 2017-05-16 DIAGNOSIS — I110 Hypertensive heart disease with heart failure: Secondary | ICD-10-CM | POA: Diagnosis not present

## 2017-05-16 DIAGNOSIS — R2689 Other abnormalities of gait and mobility: Secondary | ICD-10-CM | POA: Diagnosis not present

## 2017-05-16 DIAGNOSIS — Z7901 Long term (current) use of anticoagulants: Secondary | ICD-10-CM | POA: Diagnosis not present

## 2017-05-16 DIAGNOSIS — E119 Type 2 diabetes mellitus without complications: Secondary | ICD-10-CM | POA: Diagnosis not present

## 2017-05-16 DIAGNOSIS — Z5181 Encounter for therapeutic drug level monitoring: Secondary | ICD-10-CM | POA: Diagnosis not present

## 2017-05-17 ENCOUNTER — Ambulatory Visit (INDEPENDENT_AMBULATORY_CARE_PROVIDER_SITE_OTHER): Payer: Medicare Other | Admitting: Pharmacist Clinician (PhC)/ Clinical Pharmacy Specialist

## 2017-05-17 DIAGNOSIS — I110 Hypertensive heart disease with heart failure: Secondary | ICD-10-CM | POA: Diagnosis not present

## 2017-05-17 DIAGNOSIS — E119 Type 2 diabetes mellitus without complications: Secondary | ICD-10-CM | POA: Diagnosis not present

## 2017-05-17 DIAGNOSIS — I509 Heart failure, unspecified: Secondary | ICD-10-CM | POA: Diagnosis not present

## 2017-05-17 DIAGNOSIS — Z5181 Encounter for therapeutic drug level monitoring: Secondary | ICD-10-CM | POA: Diagnosis not present

## 2017-05-17 DIAGNOSIS — Z7901 Long term (current) use of anticoagulants: Secondary | ICD-10-CM | POA: Diagnosis not present

## 2017-05-17 DIAGNOSIS — R2689 Other abnormalities of gait and mobility: Secondary | ICD-10-CM | POA: Diagnosis not present

## 2017-05-17 LAB — PROTIME-INR: INR: 3.2 — AB (ref <=1.1)

## 2017-05-18 ENCOUNTER — Encounter: Payer: Self-pay | Admitting: Cardiovascular Disease

## 2017-05-18 ENCOUNTER — Encounter: Payer: Self-pay | Admitting: Family Medicine

## 2017-05-18 ENCOUNTER — Ambulatory Visit (INDEPENDENT_AMBULATORY_CARE_PROVIDER_SITE_OTHER): Payer: Medicare Other | Admitting: Cardiovascular Disease

## 2017-05-18 ENCOUNTER — Ambulatory Visit (INDEPENDENT_AMBULATORY_CARE_PROVIDER_SITE_OTHER): Payer: Medicare Other | Admitting: Family Medicine

## 2017-05-18 VITALS — BP 136/61 | HR 76 | Ht 67.0 in | Wt 191.0 lb

## 2017-05-18 VITALS — BP 135/69 | HR 68 | Ht 67.0 in | Wt 193.4 lb

## 2017-05-18 DIAGNOSIS — I700 Atherosclerosis of aorta: Secondary | ICD-10-CM

## 2017-05-18 DIAGNOSIS — I2510 Atherosclerotic heart disease of native coronary artery without angina pectoris: Secondary | ICD-10-CM

## 2017-05-18 DIAGNOSIS — N183 Chronic kidney disease, stage 3 unspecified: Secondary | ICD-10-CM

## 2017-05-18 DIAGNOSIS — Z794 Long term (current) use of insulin: Secondary | ICD-10-CM

## 2017-05-18 DIAGNOSIS — I442 Atrioventricular block, complete: Secondary | ICD-10-CM

## 2017-05-18 DIAGNOSIS — E0859 Diabetes mellitus due to underlying condition with other circulatory complications: Secondary | ICD-10-CM

## 2017-05-18 DIAGNOSIS — M545 Low back pain: Secondary | ICD-10-CM | POA: Diagnosis not present

## 2017-05-18 DIAGNOSIS — E1142 Type 2 diabetes mellitus with diabetic polyneuropathy: Secondary | ICD-10-CM

## 2017-05-18 DIAGNOSIS — I480 Paroxysmal atrial fibrillation: Secondary | ICD-10-CM | POA: Diagnosis not present

## 2017-05-18 DIAGNOSIS — G8929 Other chronic pain: Secondary | ICD-10-CM | POA: Diagnosis not present

## 2017-05-18 DIAGNOSIS — E1143 Type 2 diabetes mellitus with diabetic autonomic (poly)neuropathy: Secondary | ICD-10-CM | POA: Diagnosis not present

## 2017-05-18 DIAGNOSIS — Z950 Presence of cardiac pacemaker: Secondary | ICD-10-CM

## 2017-05-18 DIAGNOSIS — M159 Polyosteoarthritis, unspecified: Secondary | ICD-10-CM

## 2017-05-18 LAB — CUP PACEART INCLINIC DEVICE CHECK
Brady Statistic AP VP Percent: 39 %
Brady Statistic AP VS Percent: 0 %
Brady Statistic AS VP Percent: 61 %
Brady Statistic AS VS Percent: 0 %
Date Time Interrogation Session: 20180523104345
Implantable Lead Implant Date: 20110208
Implantable Lead Implant Date: 20110208
Implantable Lead Location: 753860
Lead Channel Impedance Value: 572 Ohm
Lead Channel Impedance Value: 588 Ohm
Lead Channel Pacing Threshold Amplitude: 0.5 V
Lead Channel Pacing Threshold Amplitude: 0.625 V
Lead Channel Pacing Threshold Pulse Width: 0.4 ms
Lead Channel Pacing Threshold Pulse Width: 0.4 ms
Lead Channel Sensing Intrinsic Amplitude: 11.2 mV
Lead Channel Setting Pacing Amplitude: 2 V
Lead Channel Setting Sensing Sensitivity: 2.8 mV
MDC IDC LEAD LOCATION: 753859
MDC IDC MSMT BATTERY IMPEDANCE: 667 Ohm
MDC IDC MSMT BATTERY REMAINING LONGEVITY: 83 mo
MDC IDC MSMT BATTERY VOLTAGE: 2.78 V
MDC IDC MSMT LEADCHNL RA PACING THRESHOLD AMPLITUDE: 0.5 V
MDC IDC MSMT LEADCHNL RA PACING THRESHOLD AMPLITUDE: 0.5 V
MDC IDC MSMT LEADCHNL RA PACING THRESHOLD PULSEWIDTH: 0.4 ms
MDC IDC MSMT LEADCHNL RA SENSING INTR AMPL: 2 mV
MDC IDC MSMT LEADCHNL RV PACING THRESHOLD PULSEWIDTH: 0.4 ms
MDC IDC PG IMPLANT DT: 20110208
MDC IDC SET LEADCHNL RA PACING AMPLITUDE: 1.5 V
MDC IDC SET LEADCHNL RV PACING PULSEWIDTH: 0.4 ms

## 2017-05-18 LAB — POCT GLYCOSYLATED HEMOGLOBIN (HGB A1C): Hemoglobin A1C: 8.1

## 2017-05-18 MED ORDER — HYDROCODONE-ACETAMINOPHEN 5-325 MG PO TABS: 1.0000 | ORAL_TABLET | Freq: Four times a day (QID) | ORAL | 0 refills | Status: DC | PRN

## 2017-05-18 NOTE — Progress Notes (Signed)
Cardiology Office Note:    Date:  05/18/2017   ID:  [name], DOB 02/02/1928, MRN [id number]  PCP:  Hali Marry, MD  Cardiologist:  Sanda Klein, MD    Referring MD: Hali Marry, *   Chief complaint: Pacemaker follow-up  History of Present Illness:    [name] is a 83 y.o. female with a hx of Complete heart block status post dual-chamber pacemaker (Medtronic, 2011), paroxysmal atrial fibrillation on long-term warfarin anticoagulation, chronic diastolic heart failure, pulmonary artery hypertension, minor coronary atherosclerosis and aortic atherosclerosis without angina or stroke, diabetes mellitus. Amiodarone was stopped November 2014 due to dyspnea and mild hypoxemia.  This is her first appointment since every 2016. This is her first formal pacemaker check since a remote download in November 2016. However, she tells me that she has had her pacemaker checked 3 times during the last 2 years as part of preop checks. As far as I can tell the last device interrogation was in April 2017, before her last femoral surgery. After suffering a right comminuted femoral shaft fracture in September 2016, she had nonunion and required repeat surgery in March 2017. Complicated by bleeding, anemia, need for transfusions, staph infection, left upper extremity thrombophlebitis, bedsores. She has been in and out of the hospital and nursing home since. She is now living with her daughter. She can stand up but cannot do so for long without assistance. She is still getting physical therapy. She is not yet walking.  She denies recent problems with syncope, palpitations, dyspnea, angina, leg edema or other cardiovascular problems. She feels like she is finally recovering from her long drawn out battle with her orthopedic problems.  Pacemaker interrogation shows normal device function. The Medtronic Adapta dual-chamber device implanted in 2011 so has roughly 7 years of estimated  generator longevity. There is 40% atrial pacing and 100% ventricular pacing. Only one brief episode of atrial fibrillation was recorded on April 29 consisting of only 1 minute and 11 seconds. This is the only episode of mode switch in the last 14 months.  Past Medical History:  Diagnosis Date  . Anemia    Pernicious  . Atrial fibrillation ([place]) 1/11    Dr Gwenlyn Found  . Back pain   . Chronic anticoagulation    on coumadin  . Chronic kidney disease    chronic, stage III  . Complete heart block ([place]) 01/31/2015  . Cystitis   . Diabetes mellitus    w/neuro manifestations, type II  . Diarrhea   . Esophageal stricture   . GERD (gastroesophageal reflux disease)   . H/O cardiac catheterization 2010   normal coronary arteries, EF 60%  . Heart failure    chronic diatolic- 2 D echo 9/03 EF 60%  . Heart murmur   . Hiatal hernia   . History of kidney stones   . Hx of echocardiogram 04/04/13   EF 55-60%, mild MR  . Hyperlipidemia   . Hypertension   . IBS (irritable bowel syndrome)   . Memory loss   . Microalbuminuria   . Non-stress test nonreactive 11/14   Lexiscan myoview negative for ischemia  . Osteoarthrosis involving multiple sites   . PAF (paroxysmal atrial fibrillation) ([place])    Last DCCV 12/29/2009  . Pancreatitis   . Peptic ulcer    unspec. w/o obstruction  . RBBB   . Senile osteoporosis   . Swelling    limb, left more than right ankle   . Symptomatic bradycardia 01/2010  CHB, temp pacer and then Medtronic PPM  . Thyroid disease   . Tubular adenoma of colon   . Ulcer    Gastric    Past Surgical History:  Procedure Laterality Date  . APPENDECTOMY    . CARDIAC CATHETERIZATION  2010   normal coronary arteries  . carotid dopplers- no significant stenosis  2007  . CATARACT EXTRACTION     both eyes  . EGD- esophageal stricture     gastric ulcer  . pacemaker placement  2/11   medtronic dual chamber  . TONSILLECTOMY AND ADENOIDECTOMY      Current Medications: Current  Meds  Medication Sig  . ascorbic acid (VITAMIN C) 500 MG tablet Take 500 mg by mouth 3 (three) times daily with meals.  Marland Kitchen b complex vitamins capsule Take 1 capsule by mouth daily.  . calcium citrate (CALCITRATE - DOSED IN MG ELEMENTAL CALCIUM) 950 MG tablet Take 950 mg by mouth daily.  . clotrimazole (LOTRIMIN) 1 % cream Apply 1 application topically 2 (two) times daily. Apply to affected areas twice daily  . gabapentin (NEURONTIN) 600 MG tablet Take 600 mg by mouth at bedtime.  Marland Kitchen glucose blood (ACCU-CHEK COMPACT PLUS) test strip Check blood sugars 3 times daily Dx: E11.9  . Insulin Syringe-Needle U-100 (EASY COMFORT INSULIN SYRINGE) 30G X 5/16" 0.5 ML MISC by Does not apply route as directed. Reported on 04/15/2016  . JANUVIA 100 MG tablet Take 1 tablet by mouth daily.  Marland Kitchen LANTUS SOLOSTAR 100 UNIT/ML Solostar Pen INJECT 28 UNITS INTO THE SKIN 2 (TWO) TIMES DAILY.  Marland Kitchen levothyroxine (SYNTHROID, LEVOTHROID) 100 MCG tablet TAKE ONE TABLET BY MOUTH DAILY  . losartan (COZAAR) 25 MG tablet Take 25 mg by mouth daily.  . memantine (NAMENDA) 5 MG tablet TAKE 1 TABLET BY MOUTH AT BEDTIME  . metoprolol tartrate (LOPRESSOR) 25 MG tablet TAKE 1 TABLET (25 MG TOTAL) BY MOUTH 2 (TWO) TIMES DAILY.  . Multiple Vitamins-Minerals (CERTAVITE SENIOR/ANTIOXIDANT) TABS Take 1 tablet by mouth daily.  . pantoprazole (PROTONIX) 40 MG tablet TAKE 1 TABLET (40 MG TOTAL) BY MOUTH DAILY.  Marland Kitchen penicillin v potassium (VEETID) 250 MG tablet Take 250 mg by mouth 3 (three) times daily.  . pravastatin (PRAVACHOL) 40 MG tablet TAKE ONE TABLET BY MOUTH NIGHTLY AT BEDTIME  . zinc oxide 20 % ointment Apply 1 application topically 2 (two) times daily as needed for irritation (to external rectum and buttocks).  . [DISCONTINUED] metoprolol (LOPRESSOR) 50 MG tablet Half tablet twice a day     Allergies:   Ace inhibitors; Angiotensin receptor blockers; Fosinopril sodium; Glipizide; Metformin and related; and Sulfamethoxazole-trimethoprim    Social History   Social History  . Marital status: Widowed    Spouse name: N/A  . Number of children: N/A  . Years of education: N/A   Social History Main Topics  . Smoking status: Never Smoker  . Smokeless tobacco: Never Used  . Alcohol use No  . Drug use: No  . Sexual activity: No   Other Topics Concern  . Not on file   Social History Narrative  . No narrative on file     Family History: The patient's family history includes Alzheimer's disease in her sister; Colon cancer in her sister; Diabetes in her mother; Heart attack in her father; Heart disease in her brother; Prostate cancer in her father. There is no history of Esophageal cancer, Rectal cancer, or Stomach cancer. ROS:   Please see the history of present illness.  All other systems reviewed and are negative.  EKGs/Labs/Other Studies Reviewed:     EKG:  EKG is ordered today.  The ekg ordered today demonstrates Atrial paced, ventricular paced rhythm, QTC 510 ms  Recent Labs: 12/06/2016: ALT 13; BUN 21; Creat 1.46; Hemoglobin 12.7; Platelets 274; Potassium 4.3; Sodium 139; TSH 5.34   Recent Lipid Panel    Component Value Date/Time   CHOL 128 07/09/2015 1000   TRIG 125 07/09/2015 1000   HDL 50 07/09/2015 1000   CHOLHDL 2.6 07/09/2015 1000   VLDL 25 07/09/2015 1000   LDLCALC 53 07/09/2015 1000   LDLDIRECT 73 08/07/2009 0028    Physical Exam:    VS:  BP 135/69   Pulse 68   Ht 5\' 7"  (1.702 m)   Wt 193 lb 6.4 oz (87.7 kg)   BMI 30.29 kg/m     Wt Readings from Last 3 Encounters:  05/18/17 191 lb (86.6 kg)  05/18/17 193 lb 6.4 oz (87.7 kg)  02/16/17 188 lb (85.3 kg)     GEN: Smiling, comfortable. She is in a wheelchair. Well nourished, well developed in no acute distress HEENT: Normal NECK: No JVD; No carotid bruits LYMPHATICS: No lymphadenopathy CARDIAC: Paradoxically split S2 RRR, no murmurs, rubs, gallops. Healthy subclavian pacemaker site RESPIRATORY:  Clear to auscultation without rales,  wheezing or rhonchi  ABDOMEN: Soft, non-tender, non-distended MUSCULOSKELETAL:  No edema; No deformity  SKIN: Warm and dry NEUROLOGIC:  Alert and oriented x 3 PSYCHIATRIC:  Normal affect   ASSESSMENT:    1. PAF (paroxysmal atrial fibrillation) ([place])   2. Complete heart block ([place])   3. Pacemaker   4. Aortic atherosclerosis ([place])   5. Atherosclerosis of native coronary artery of native heart without angina pectoris   6. Diabetes mellitus due to underlying condition with other circulatory complication, without long-term current use of insulin ([facility])    PLAN:    In order of problems listed above:  1. AFib: Despite not receiving any antiarrhythmic therapy, she has only had roughly a minute of atrial fibrillation in the last year. In December 2015 she had an episode of atrial fibrillation that lasted for a whole week it was asymptomatic. Amiodarone was stopped 2016 due to concerns regarding hypoxemia and chest x-ray changes and should not be restarted. Currently not receiving anticoagulants. These will need to be restarted if we detect atrial fibrillation. CHADSVasc 3(age 89, gender). 2. CHB: She has complete heart block but today has a ventricular escape rhythm at about 40 bpm. For safety should be considered pacemaker dependent. 3. PPM: Normal device function. Resume every 3 month remote downloads and yearly office visits. 4. Aortic and coronary artery atherosclerosis: Remote cardiac catheterization showed only minor coronary plaque without significant stenoses in 2010. She does not have angina pectoris. Most recent functional study was a normal nuclear test in 2014. The focus is on coronary risk factor management. She has well treated diabetes and takes a statin for hyperlipidemia. 5. HLP: I don't have a recent lipid profile. Target LDL under 70. 6. DM: Most recent lipid profile shows glycemic control is less than optimal at 8.1%.   Medication Adjustments/Labs and Tests Ordered: Current  medicines are reviewed at length with the patient today.  Concerns regarding medicines are outlined above. Labs and tests ordered and medication changes are outlined in the patient instructions below:  Patient Instructions  Dr Sallyanne Kuster recommends that you continue on your current medications as directed. Please refer to the Current Medication list given to you  today.  Remote monitoring is used to monitor your Pacemaker or ICD from home. This monitoring reduces the number of office visits required to check your device to one time per year. It allows [place] to keep an eye on the functioning of your device to ensure it is working properly. You are scheduled for a device check from home on Wednesday, August 22nd, 2018. You may send your transmission at any time that day. If you have a wireless device, the transmission will be sent automatically. After your physician reviews your transmission, you will receive a notification with your next transmission date.  Dr Sallyanne Kuster recommends that you schedule a follow-up appointment in 12 months with a pacemaker check. You will receive a reminder letter in the mail two months in advance. If you don't receive a letter, please call our office to schedule the follow-up appointment.  If you need a refill on your cardiac medications before your next appointment, please call your pharmacy.    Signed, Sanda Klein, MD  05/18/2017 1:29 PM    [facility]

## 2017-05-18 NOTE — Patient Instructions (Addendum)
Check out [facility] for cost on script.   Try eating a small snack around 9:00 each night for the next 4-5 days and see if blood sugar at bedtime actually looks a little better. Call me on Tuesday with those results.

## 2017-05-18 NOTE — Patient Instructions (Signed)

## 2017-05-18 NOTE — Progress Notes (Signed)
Subjective:    CC:   HPI:  Diabetes - no hypoglycemic events. No wounds or sores that are not healing well. No increased thirst or urination. Checking glucose at home. Taking medications as prescribed without any side effects.He increased her Lantus to 22 units when I saw her 3 months ago. She is currently using 20 units in the morning and 25 in the evening. Her blood sugars particularly in the evening started to creep up in April and then a little higher this month in May. Her morning blood sugars have been running between 100-200.She reports she's been doing really well with her diet. She's been avoiding sweets and really eating small portions.  Unfortunately she has had her Medicare gap and is having some difficulty affording her medications.  CKD 3-just had some blood work done by Dr. Carrolyn Meiers last week. They have not heard back on the results. Unfortunately her creatinine level had actually crept up a little bit.    She is also requesting a refill on her hydrocodoneFor her chronic hip and back pain. We last saw oxycodone about 3 months ago and gave her 60 tabs. This is lasted her at least 3 months. She really takes it more as needed.  Past medical history, Surgical history, Family history not pertinant except as noted below, Social history, Allergies, and medications have been entered into the medical record, reviewed, and corrections made.   Review of Systems: No fevers, chills, night sweats, weight loss, chest pain, or shortness of breath.   Objective:    General: Well Developed, well nourished, and in no acute distress.  Neuro: Alert and oriented x3, extra-ocular muscles intact, sensation grossly intact.  HEENT: Normocephalic, atraumatic  Skin: Warm and dry, no rashes. Cardiac: Regular rate and rhythm, no murmurs rubs or gallops, no lower extremity edema.  Respiratory: Clear to auscultation bilaterally. Not using accessory muscles, speaking in full sentences.   Impression and  Recommendations:   DM- Uncontrolled.  Her evening blood sugars are consistently elevated. Impaction have her try to eat a small snack around 9 PM she typically eats dinner around 6 PM. And see if her blood sugar when she checks at around 11 PM at bedtime actually gets better. I must wonder if she is getting an access release of glucose from the liver that's causing a significant bump in her glucose. She will call me next week and let me know if this actually seems to improve her blood sugars.  Foot Exam performed today.  CKD - 3 - I haven't received copy of labs form Dr. Carrolyn Meiers.    Diabetic neuropathy-unable to feel the monofilament on exam. Make sure checking feet daily for sores or wounds.  Medication management-did encourage her to check with the drug companies now that she is in her Medicare gap, she is now under insured she may qualify for some assistance.  Chronic back pain/hip pain-did refill her pain medication today. We'll switch back to hydrocodone. Given 40 tabs today to better reflect how often she is using it. Her pain contract is up-to-date from November 2017. Verified information in the [facility].  Printed prescription given.

## 2017-05-20 DIAGNOSIS — I110 Hypertensive heart disease with heart failure: Secondary | ICD-10-CM | POA: Diagnosis not present

## 2017-05-20 DIAGNOSIS — Z7901 Long term (current) use of anticoagulants: Secondary | ICD-10-CM | POA: Diagnosis not present

## 2017-05-20 DIAGNOSIS — R2689 Other abnormalities of gait and mobility: Secondary | ICD-10-CM | POA: Diagnosis not present

## 2017-05-20 DIAGNOSIS — E119 Type 2 diabetes mellitus without complications: Secondary | ICD-10-CM | POA: Diagnosis not present

## 2017-05-20 DIAGNOSIS — Z5181 Encounter for therapeutic drug level monitoring: Secondary | ICD-10-CM | POA: Diagnosis not present

## 2017-05-20 DIAGNOSIS — I509 Heart failure, unspecified: Secondary | ICD-10-CM | POA: Diagnosis not present

## 2017-05-24 ENCOUNTER — Other Ambulatory Visit: Payer: Self-pay

## 2017-05-24 ENCOUNTER — Ambulatory Visit (INDEPENDENT_AMBULATORY_CARE_PROVIDER_SITE_OTHER): Payer: Medicare Other | Admitting: Pharmacist Clinician (PhC)/ Clinical Pharmacy Specialist

## 2017-05-24 DIAGNOSIS — I509 Heart failure, unspecified: Secondary | ICD-10-CM | POA: Diagnosis not present

## 2017-05-24 DIAGNOSIS — Z5181 Encounter for therapeutic drug level monitoring: Secondary | ICD-10-CM | POA: Diagnosis not present

## 2017-05-24 DIAGNOSIS — E119 Type 2 diabetes mellitus without complications: Secondary | ICD-10-CM | POA: Diagnosis not present

## 2017-05-24 DIAGNOSIS — Z7901 Long term (current) use of anticoagulants: Secondary | ICD-10-CM | POA: Diagnosis not present

## 2017-05-24 DIAGNOSIS — I110 Hypertensive heart disease with heart failure: Secondary | ICD-10-CM | POA: Diagnosis not present

## 2017-05-24 DIAGNOSIS — R2689 Other abnormalities of gait and mobility: Secondary | ICD-10-CM | POA: Diagnosis not present

## 2017-05-24 LAB — POCT INR: INR: 2.3

## 2017-05-25 DIAGNOSIS — Z7901 Long term (current) use of anticoagulants: Secondary | ICD-10-CM | POA: Diagnosis not present

## 2017-05-25 DIAGNOSIS — Z5181 Encounter for therapeutic drug level monitoring: Secondary | ICD-10-CM | POA: Diagnosis not present

## 2017-05-25 DIAGNOSIS — I509 Heart failure, unspecified: Secondary | ICD-10-CM | POA: Diagnosis not present

## 2017-05-25 DIAGNOSIS — E119 Type 2 diabetes mellitus without complications: Secondary | ICD-10-CM | POA: Diagnosis not present

## 2017-05-25 DIAGNOSIS — R2689 Other abnormalities of gait and mobility: Secondary | ICD-10-CM | POA: Diagnosis not present

## 2017-05-25 DIAGNOSIS — I110 Hypertensive heart disease with heart failure: Secondary | ICD-10-CM | POA: Diagnosis not present

## 2017-05-25 LAB — HM DIABETES EYE EXAM

## 2017-05-27 DIAGNOSIS — I110 Hypertensive heart disease with heart failure: Secondary | ICD-10-CM | POA: Diagnosis not present

## 2017-05-27 DIAGNOSIS — E119 Type 2 diabetes mellitus without complications: Secondary | ICD-10-CM | POA: Diagnosis not present

## 2017-05-27 DIAGNOSIS — I509 Heart failure, unspecified: Secondary | ICD-10-CM | POA: Diagnosis not present

## 2017-05-27 DIAGNOSIS — R2689 Other abnormalities of gait and mobility: Secondary | ICD-10-CM | POA: Diagnosis not present

## 2017-05-27 DIAGNOSIS — Z5181 Encounter for therapeutic drug level monitoring: Secondary | ICD-10-CM | POA: Diagnosis not present

## 2017-05-27 DIAGNOSIS — Z7901 Long term (current) use of anticoagulants: Secondary | ICD-10-CM | POA: Diagnosis not present

## 2017-05-30 DIAGNOSIS — I110 Hypertensive heart disease with heart failure: Secondary | ICD-10-CM | POA: Diagnosis not present

## 2017-05-30 DIAGNOSIS — E119 Type 2 diabetes mellitus without complications: Secondary | ICD-10-CM | POA: Diagnosis not present

## 2017-05-30 DIAGNOSIS — Z7901 Long term (current) use of anticoagulants: Secondary | ICD-10-CM | POA: Diagnosis not present

## 2017-05-30 DIAGNOSIS — Z5181 Encounter for therapeutic drug level monitoring: Secondary | ICD-10-CM | POA: Diagnosis not present

## 2017-05-30 DIAGNOSIS — I509 Heart failure, unspecified: Secondary | ICD-10-CM | POA: Diagnosis not present

## 2017-05-30 DIAGNOSIS — R2689 Other abnormalities of gait and mobility: Secondary | ICD-10-CM | POA: Diagnosis not present

## 2017-06-02 DIAGNOSIS — R2689 Other abnormalities of gait and mobility: Secondary | ICD-10-CM | POA: Diagnosis not present

## 2017-06-02 DIAGNOSIS — Z7901 Long term (current) use of anticoagulants: Secondary | ICD-10-CM | POA: Diagnosis not present

## 2017-06-02 DIAGNOSIS — I509 Heart failure, unspecified: Secondary | ICD-10-CM | POA: Diagnosis not present

## 2017-06-02 DIAGNOSIS — Z5181 Encounter for therapeutic drug level monitoring: Secondary | ICD-10-CM | POA: Diagnosis not present

## 2017-06-02 DIAGNOSIS — E119 Type 2 diabetes mellitus without complications: Secondary | ICD-10-CM | POA: Diagnosis not present

## 2017-06-02 DIAGNOSIS — I110 Hypertensive heart disease with heart failure: Secondary | ICD-10-CM | POA: Diagnosis not present

## 2017-06-03 ENCOUNTER — Encounter: Payer: Self-pay | Admitting: Family Medicine

## 2017-06-03 DIAGNOSIS — Z5181 Encounter for therapeutic drug level monitoring: Secondary | ICD-10-CM | POA: Diagnosis not present

## 2017-06-03 DIAGNOSIS — E119 Type 2 diabetes mellitus without complications: Secondary | ICD-10-CM | POA: Diagnosis not present

## 2017-06-03 DIAGNOSIS — I110 Hypertensive heart disease with heart failure: Secondary | ICD-10-CM | POA: Diagnosis not present

## 2017-06-03 DIAGNOSIS — I509 Heart failure, unspecified: Secondary | ICD-10-CM | POA: Diagnosis not present

## 2017-06-03 DIAGNOSIS — R2689 Other abnormalities of gait and mobility: Secondary | ICD-10-CM | POA: Diagnosis not present

## 2017-06-03 DIAGNOSIS — Z7901 Long term (current) use of anticoagulants: Secondary | ICD-10-CM | POA: Diagnosis not present

## 2017-06-06 DIAGNOSIS — Z5181 Encounter for therapeutic drug level monitoring: Secondary | ICD-10-CM | POA: Diagnosis not present

## 2017-06-06 DIAGNOSIS — Z7901 Long term (current) use of anticoagulants: Secondary | ICD-10-CM | POA: Diagnosis not present

## 2017-06-06 DIAGNOSIS — I509 Heart failure, unspecified: Secondary | ICD-10-CM | POA: Diagnosis not present

## 2017-06-06 DIAGNOSIS — E119 Type 2 diabetes mellitus without complications: Secondary | ICD-10-CM | POA: Diagnosis not present

## 2017-06-06 DIAGNOSIS — I110 Hypertensive heart disease with heart failure: Secondary | ICD-10-CM | POA: Diagnosis not present

## 2017-06-06 DIAGNOSIS — R2689 Other abnormalities of gait and mobility: Secondary | ICD-10-CM | POA: Diagnosis not present

## 2017-06-07 ENCOUNTER — Ambulatory Visit (INDEPENDENT_AMBULATORY_CARE_PROVIDER_SITE_OTHER): Payer: Medicare Other | Admitting: Pharmacist Clinician (PhC)/ Clinical Pharmacy Specialist

## 2017-06-07 DIAGNOSIS — Z5181 Encounter for therapeutic drug level monitoring: Secondary | ICD-10-CM | POA: Diagnosis not present

## 2017-06-07 DIAGNOSIS — Z7901 Long term (current) use of anticoagulants: Secondary | ICD-10-CM

## 2017-06-07 DIAGNOSIS — I509 Heart failure, unspecified: Secondary | ICD-10-CM | POA: Diagnosis not present

## 2017-06-07 DIAGNOSIS — I110 Hypertensive heart disease with heart failure: Secondary | ICD-10-CM | POA: Diagnosis not present

## 2017-06-07 DIAGNOSIS — R2689 Other abnormalities of gait and mobility: Secondary | ICD-10-CM | POA: Diagnosis not present

## 2017-06-07 DIAGNOSIS — E119 Type 2 diabetes mellitus without complications: Secondary | ICD-10-CM | POA: Diagnosis not present

## 2017-06-07 LAB — POCT INR: INR: 3.2

## 2017-06-10 DIAGNOSIS — I509 Heart failure, unspecified: Secondary | ICD-10-CM | POA: Diagnosis not present

## 2017-06-10 DIAGNOSIS — Z5181 Encounter for therapeutic drug level monitoring: Secondary | ICD-10-CM | POA: Diagnosis not present

## 2017-06-10 DIAGNOSIS — R2689 Other abnormalities of gait and mobility: Secondary | ICD-10-CM | POA: Diagnosis not present

## 2017-06-10 DIAGNOSIS — Z7901 Long term (current) use of anticoagulants: Secondary | ICD-10-CM | POA: Diagnosis not present

## 2017-06-10 DIAGNOSIS — E119 Type 2 diabetes mellitus without complications: Secondary | ICD-10-CM | POA: Diagnosis not present

## 2017-06-10 DIAGNOSIS — I110 Hypertensive heart disease with heart failure: Secondary | ICD-10-CM | POA: Diagnosis not present

## 2017-06-15 ENCOUNTER — Telehealth: Payer: Self-pay | Admitting: Family Medicine

## 2017-06-15 DIAGNOSIS — Z9181 History of falling: Secondary | ICD-10-CM | POA: Diagnosis not present

## 2017-06-15 DIAGNOSIS — Z96651 Presence of right artificial knee joint: Secondary | ICD-10-CM | POA: Diagnosis not present

## 2017-06-15 DIAGNOSIS — Z794 Long term (current) use of insulin: Secondary | ICD-10-CM | POA: Diagnosis not present

## 2017-06-15 DIAGNOSIS — R2689 Other abnormalities of gait and mobility: Secondary | ICD-10-CM | POA: Diagnosis not present

## 2017-06-15 DIAGNOSIS — I110 Hypertensive heart disease with heart failure: Secondary | ICD-10-CM | POA: Diagnosis not present

## 2017-06-15 DIAGNOSIS — I509 Heart failure, unspecified: Secondary | ICD-10-CM | POA: Diagnosis not present

## 2017-06-15 DIAGNOSIS — Z5181 Encounter for therapeutic drug level monitoring: Secondary | ICD-10-CM | POA: Diagnosis not present

## 2017-06-15 DIAGNOSIS — Z950 Presence of cardiac pacemaker: Secondary | ICD-10-CM | POA: Diagnosis not present

## 2017-06-15 DIAGNOSIS — Z7901 Long term (current) use of anticoagulants: Secondary | ICD-10-CM | POA: Diagnosis not present

## 2017-06-15 DIAGNOSIS — E119 Type 2 diabetes mellitus without complications: Secondary | ICD-10-CM | POA: Diagnosis not present

## 2017-06-15 NOTE — Telephone Encounter (Signed)
Received call from [place] with [facility] for verbal orders on the following:   Nursing home health aid to assist with bathing and ADL's: 2x week for 9 weeks.   INR check: every 2 weeks for 9 weeks.   Verbal order given.

## 2017-06-17 DIAGNOSIS — Z950 Presence of cardiac pacemaker: Secondary | ICD-10-CM | POA: Diagnosis not present

## 2017-06-17 DIAGNOSIS — Z9181 History of falling: Secondary | ICD-10-CM | POA: Diagnosis not present

## 2017-06-17 DIAGNOSIS — E119 Type 2 diabetes mellitus without complications: Secondary | ICD-10-CM | POA: Diagnosis not present

## 2017-06-17 DIAGNOSIS — Z96651 Presence of right artificial knee joint: Secondary | ICD-10-CM | POA: Diagnosis not present

## 2017-06-17 DIAGNOSIS — I110 Hypertensive heart disease with heart failure: Secondary | ICD-10-CM | POA: Diagnosis not present

## 2017-06-17 DIAGNOSIS — I509 Heart failure, unspecified: Secondary | ICD-10-CM | POA: Diagnosis not present

## 2017-06-17 DIAGNOSIS — Z7901 Long term (current) use of anticoagulants: Secondary | ICD-10-CM | POA: Diagnosis not present

## 2017-06-17 DIAGNOSIS — Z5181 Encounter for therapeutic drug level monitoring: Secondary | ICD-10-CM | POA: Diagnosis not present

## 2017-06-17 DIAGNOSIS — R2689 Other abnormalities of gait and mobility: Secondary | ICD-10-CM | POA: Diagnosis not present

## 2017-06-17 DIAGNOSIS — Z794 Long term (current) use of insulin: Secondary | ICD-10-CM | POA: Diagnosis not present

## 2017-06-18 NOTE — Telephone Encounter (Signed)
Agree with above 

## 2017-06-20 ENCOUNTER — Other Ambulatory Visit: Payer: Self-pay | Admitting: Family Medicine

## 2017-06-20 DIAGNOSIS — Z794 Long term (current) use of insulin: Secondary | ICD-10-CM | POA: Diagnosis not present

## 2017-06-20 DIAGNOSIS — I110 Hypertensive heart disease with heart failure: Secondary | ICD-10-CM | POA: Diagnosis not present

## 2017-06-20 DIAGNOSIS — Z9181 History of falling: Secondary | ICD-10-CM | POA: Diagnosis not present

## 2017-06-20 DIAGNOSIS — Z96651 Presence of right artificial knee joint: Secondary | ICD-10-CM | POA: Diagnosis not present

## 2017-06-20 DIAGNOSIS — Z5181 Encounter for therapeutic drug level monitoring: Secondary | ICD-10-CM | POA: Diagnosis not present

## 2017-06-20 DIAGNOSIS — Z7901 Long term (current) use of anticoagulants: Secondary | ICD-10-CM | POA: Diagnosis not present

## 2017-06-20 DIAGNOSIS — R2689 Other abnormalities of gait and mobility: Secondary | ICD-10-CM | POA: Diagnosis not present

## 2017-06-20 DIAGNOSIS — Z950 Presence of cardiac pacemaker: Secondary | ICD-10-CM | POA: Diagnosis not present

## 2017-06-20 DIAGNOSIS — I509 Heart failure, unspecified: Secondary | ICD-10-CM | POA: Diagnosis not present

## 2017-06-20 DIAGNOSIS — E119 Type 2 diabetes mellitus without complications: Secondary | ICD-10-CM | POA: Diagnosis not present

## 2017-06-21 ENCOUNTER — Ambulatory Visit (INDEPENDENT_AMBULATORY_CARE_PROVIDER_SITE_OTHER): Payer: Medicare Other | Admitting: Pharmacist

## 2017-06-21 ENCOUNTER — Telehealth: Payer: Self-pay | Admitting: Family Medicine

## 2017-06-21 DIAGNOSIS — Z7901 Long term (current) use of anticoagulants: Secondary | ICD-10-CM | POA: Diagnosis not present

## 2017-06-21 DIAGNOSIS — Z96651 Presence of right artificial knee joint: Secondary | ICD-10-CM | POA: Diagnosis not present

## 2017-06-21 DIAGNOSIS — E119 Type 2 diabetes mellitus without complications: Secondary | ICD-10-CM | POA: Diagnosis not present

## 2017-06-21 DIAGNOSIS — Z950 Presence of cardiac pacemaker: Secondary | ICD-10-CM | POA: Diagnosis not present

## 2017-06-21 DIAGNOSIS — Z9181 History of falling: Secondary | ICD-10-CM | POA: Diagnosis not present

## 2017-06-21 DIAGNOSIS — I509 Heart failure, unspecified: Secondary | ICD-10-CM | POA: Diagnosis not present

## 2017-06-21 DIAGNOSIS — R2689 Other abnormalities of gait and mobility: Secondary | ICD-10-CM | POA: Diagnosis not present

## 2017-06-21 DIAGNOSIS — I110 Hypertensive heart disease with heart failure: Secondary | ICD-10-CM | POA: Diagnosis not present

## 2017-06-21 DIAGNOSIS — Z5181 Encounter for therapeutic drug level monitoring: Secondary | ICD-10-CM | POA: Diagnosis not present

## 2017-06-21 DIAGNOSIS — Z794 Long term (current) use of insulin: Secondary | ICD-10-CM | POA: Diagnosis not present

## 2017-06-21 LAB — PROTIME-INR: INR: 2.9 — AB (ref <=1.1)

## 2017-06-21 NOTE — Telephone Encounter (Addendum)
[name] with [facility] called. Pt is unable to Void and she is requesting an Order to do an In and Out Cath in the morning. [name] can be reached at [phone]. Thank you.

## 2017-06-21 NOTE — Telephone Encounter (Signed)
Received call from [place] with [facility] requesting order to check Pt's urine. Pt is reporting symptoms of dysuria. Verbal order given. Pt also states that Macrodantin has worked well her in the past.

## 2017-06-21 NOTE — Telephone Encounter (Signed)
Ok to give verbal for in and out cath

## 2017-06-22 DIAGNOSIS — I110 Hypertensive heart disease with heart failure: Secondary | ICD-10-CM | POA: Diagnosis not present

## 2017-06-22 DIAGNOSIS — R2689 Other abnormalities of gait and mobility: Secondary | ICD-10-CM | POA: Diagnosis not present

## 2017-06-22 DIAGNOSIS — Z794 Long term (current) use of insulin: Secondary | ICD-10-CM | POA: Diagnosis not present

## 2017-06-22 DIAGNOSIS — Z950 Presence of cardiac pacemaker: Secondary | ICD-10-CM | POA: Diagnosis not present

## 2017-06-22 DIAGNOSIS — N390 Urinary tract infection, site not specified: Secondary | ICD-10-CM | POA: Diagnosis not present

## 2017-06-22 DIAGNOSIS — Z96651 Presence of right artificial knee joint: Secondary | ICD-10-CM | POA: Diagnosis not present

## 2017-06-22 DIAGNOSIS — I509 Heart failure, unspecified: Secondary | ICD-10-CM | POA: Diagnosis not present

## 2017-06-22 DIAGNOSIS — Z9181 History of falling: Secondary | ICD-10-CM | POA: Diagnosis not present

## 2017-06-22 DIAGNOSIS — E119 Type 2 diabetes mellitus without complications: Secondary | ICD-10-CM | POA: Diagnosis not present

## 2017-06-22 DIAGNOSIS — Z5181 Encounter for therapeutic drug level monitoring: Secondary | ICD-10-CM | POA: Diagnosis not present

## 2017-06-22 DIAGNOSIS — Z7901 Long term (current) use of anticoagulants: Secondary | ICD-10-CM | POA: Diagnosis not present

## 2017-06-22 LAB — URINALYSIS
APPEARANCE: ABNORMAL
Bacteria: ABNORMAL
Bilirubin: NEGATIVE
CASTS: NONE SEEN
Epithelial Cells: >10
Glucose: ABNORMAL
KETONE: NEGATIVE
NITRITE: ABNORMAL
OCCULT BLOOD: ABNORMAL
Protein: ABNORMAL
RBC: >30
Specific Gravity, UA: 1.015
Urobilinogen, Ur: 0.2
WBC ESTIMATE: ABNORMAL
pH: 5.5

## 2017-06-22 NOTE — Telephone Encounter (Signed)
Called [name] who was previously given verbal by Shaune Pascal. [name] stated that the pt has been having unusually high blood sugar readings in the evenings in the 500s and she is concerned. Please advise.

## 2017-06-22 NOTE — Telephone Encounter (Signed)
Please confirm insuln dose with patient.  She doesn't always take as the med list reports.

## 2017-06-23 DIAGNOSIS — I509 Heart failure, unspecified: Secondary | ICD-10-CM | POA: Diagnosis not present

## 2017-06-23 DIAGNOSIS — Z950 Presence of cardiac pacemaker: Secondary | ICD-10-CM | POA: Diagnosis not present

## 2017-06-23 DIAGNOSIS — I110 Hypertensive heart disease with heart failure: Secondary | ICD-10-CM | POA: Diagnosis not present

## 2017-06-23 DIAGNOSIS — Z96651 Presence of right artificial knee joint: Secondary | ICD-10-CM | POA: Diagnosis not present

## 2017-06-23 DIAGNOSIS — Z9181 History of falling: Secondary | ICD-10-CM | POA: Diagnosis not present

## 2017-06-23 DIAGNOSIS — Z7901 Long term (current) use of anticoagulants: Secondary | ICD-10-CM | POA: Diagnosis not present

## 2017-06-23 DIAGNOSIS — R2689 Other abnormalities of gait and mobility: Secondary | ICD-10-CM | POA: Diagnosis not present

## 2017-06-23 DIAGNOSIS — E119 Type 2 diabetes mellitus without complications: Secondary | ICD-10-CM | POA: Diagnosis not present

## 2017-06-23 DIAGNOSIS — Z5181 Encounter for therapeutic drug level monitoring: Secondary | ICD-10-CM | POA: Diagnosis not present

## 2017-06-23 DIAGNOSIS — Z794 Long term (current) use of insulin: Secondary | ICD-10-CM | POA: Diagnosis not present

## 2017-06-24 DIAGNOSIS — Z5181 Encounter for therapeutic drug level monitoring: Secondary | ICD-10-CM | POA: Diagnosis not present

## 2017-06-24 DIAGNOSIS — I110 Hypertensive heart disease with heart failure: Secondary | ICD-10-CM | POA: Diagnosis not present

## 2017-06-24 DIAGNOSIS — Z7901 Long term (current) use of anticoagulants: Secondary | ICD-10-CM | POA: Diagnosis not present

## 2017-06-24 DIAGNOSIS — Z950 Presence of cardiac pacemaker: Secondary | ICD-10-CM | POA: Diagnosis not present

## 2017-06-24 DIAGNOSIS — E119 Type 2 diabetes mellitus without complications: Secondary | ICD-10-CM | POA: Diagnosis not present

## 2017-06-24 DIAGNOSIS — R2689 Other abnormalities of gait and mobility: Secondary | ICD-10-CM | POA: Diagnosis not present

## 2017-06-24 DIAGNOSIS — Z9181 History of falling: Secondary | ICD-10-CM | POA: Diagnosis not present

## 2017-06-24 DIAGNOSIS — I509 Heart failure, unspecified: Secondary | ICD-10-CM | POA: Diagnosis not present

## 2017-06-24 DIAGNOSIS — Z794 Long term (current) use of insulin: Secondary | ICD-10-CM | POA: Diagnosis not present

## 2017-06-24 DIAGNOSIS — Z96651 Presence of right artificial knee joint: Secondary | ICD-10-CM | POA: Diagnosis not present

## 2017-06-24 NOTE — Telephone Encounter (Signed)
Pt states she is taking 20 units in the morning and 25 units and night. Also verbalized that she is taking her meds. Please advise.

## 2017-06-26 NOTE — Telephone Encounter (Signed)
Ok to increase Lantus to 28 units in the AM and 22 in evening.

## 2017-06-27 ENCOUNTER — Telehealth: Payer: Self-pay | Admitting: Family Medicine

## 2017-06-27 NOTE — Telephone Encounter (Signed)
Call patient: Urine culture came back contaminated. If she is still having some symptoms and recommend recollection make sure it's a good clean catch.

## 2017-06-27 NOTE — Telephone Encounter (Signed)
Pt notified -EH/RMA  

## 2017-06-27 NOTE — Telephone Encounter (Signed)
Called pt and gave recommendations. [name], [name]

## 2017-06-28 ENCOUNTER — Telehealth: Payer: Self-pay | Admitting: Pharmacist

## 2017-06-28 ENCOUNTER — Telehealth: Payer: Self-pay | Admitting: *Deleted

## 2017-06-28 ENCOUNTER — Other Ambulatory Visit: Payer: Self-pay | Admitting: *Deleted

## 2017-06-28 DIAGNOSIS — Z950 Presence of cardiac pacemaker: Secondary | ICD-10-CM | POA: Diagnosis not present

## 2017-06-28 DIAGNOSIS — Z7901 Long term (current) use of anticoagulants: Secondary | ICD-10-CM | POA: Diagnosis not present

## 2017-06-28 DIAGNOSIS — Z96651 Presence of right artificial knee joint: Secondary | ICD-10-CM | POA: Diagnosis not present

## 2017-06-28 DIAGNOSIS — Z5181 Encounter for therapeutic drug level monitoring: Secondary | ICD-10-CM | POA: Diagnosis not present

## 2017-06-28 DIAGNOSIS — R2689 Other abnormalities of gait and mobility: Secondary | ICD-10-CM | POA: Diagnosis not present

## 2017-06-28 DIAGNOSIS — I509 Heart failure, unspecified: Secondary | ICD-10-CM | POA: Diagnosis not present

## 2017-06-28 DIAGNOSIS — I110 Hypertensive heart disease with heart failure: Secondary | ICD-10-CM | POA: Diagnosis not present

## 2017-06-28 DIAGNOSIS — E119 Type 2 diabetes mellitus without complications: Secondary | ICD-10-CM | POA: Diagnosis not present

## 2017-06-28 DIAGNOSIS — Z794 Long term (current) use of insulin: Secondary | ICD-10-CM | POA: Diagnosis not present

## 2017-06-28 DIAGNOSIS — Z9181 History of falling: Secondary | ICD-10-CM | POA: Diagnosis not present

## 2017-06-28 MED ORDER — FLUCONAZOLE 150 MG PO TABS: 150.0000 mg | ORAL_TABLET | Freq: Once | ORAL | 2 refills | Status: AC

## 2017-06-28 MED ORDER — ALIGN PO CAPS: ORAL_CAPSULE | ORAL | 11 refills | Status: DC

## 2017-06-28 NOTE — Telephone Encounter (Signed)
Called and stated that she attempted to go by to collect a urine sample from the pt however she was unable to due to PT being there. [name], [name]

## 2017-06-28 NOTE — Progress Notes (Unsigned)
Spoke w/Kaileigh and she examined pt and she noticed that pt has some vaginal bleeding,external irritation, no fever, yellow d/c. Will send over rx for diflucan and probiotic.  [name], [name]

## 2017-06-28 NOTE — Telephone Encounter (Signed)
Patient was NOT initiated on any antibiotics last week but need repeat UA for further assessment.  Will repeat INR on Thursday 06/30/2017 to continue close monitoring.

## 2017-06-30 ENCOUNTER — Ambulatory Visit (INDEPENDENT_AMBULATORY_CARE_PROVIDER_SITE_OTHER): Payer: Medicare Other | Admitting: Pharmacist

## 2017-06-30 DIAGNOSIS — Z5181 Encounter for therapeutic drug level monitoring: Secondary | ICD-10-CM | POA: Diagnosis not present

## 2017-06-30 DIAGNOSIS — I110 Hypertensive heart disease with heart failure: Secondary | ICD-10-CM | POA: Diagnosis not present

## 2017-06-30 DIAGNOSIS — Z7901 Long term (current) use of anticoagulants: Secondary | ICD-10-CM

## 2017-06-30 DIAGNOSIS — Z96651 Presence of right artificial knee joint: Secondary | ICD-10-CM | POA: Diagnosis not present

## 2017-06-30 DIAGNOSIS — Z794 Long term (current) use of insulin: Secondary | ICD-10-CM | POA: Diagnosis not present

## 2017-06-30 DIAGNOSIS — I509 Heart failure, unspecified: Secondary | ICD-10-CM | POA: Diagnosis not present

## 2017-06-30 DIAGNOSIS — E119 Type 2 diabetes mellitus without complications: Secondary | ICD-10-CM | POA: Diagnosis not present

## 2017-06-30 DIAGNOSIS — Z9181 History of falling: Secondary | ICD-10-CM | POA: Diagnosis not present

## 2017-06-30 DIAGNOSIS — Z950 Presence of cardiac pacemaker: Secondary | ICD-10-CM | POA: Diagnosis not present

## 2017-06-30 DIAGNOSIS — R2689 Other abnormalities of gait and mobility: Secondary | ICD-10-CM | POA: Diagnosis not present

## 2017-06-30 LAB — PROTIME-INR: INR: 3.4 — AB (ref <=1.1)

## 2017-07-01 DIAGNOSIS — Z96651 Presence of right artificial knee joint: Secondary | ICD-10-CM | POA: Diagnosis not present

## 2017-07-01 DIAGNOSIS — R2689 Other abnormalities of gait and mobility: Secondary | ICD-10-CM | POA: Diagnosis not present

## 2017-07-01 DIAGNOSIS — E119 Type 2 diabetes mellitus without complications: Secondary | ICD-10-CM | POA: Diagnosis not present

## 2017-07-01 DIAGNOSIS — Z9181 History of falling: Secondary | ICD-10-CM | POA: Diagnosis not present

## 2017-07-01 DIAGNOSIS — Z5181 Encounter for therapeutic drug level monitoring: Secondary | ICD-10-CM | POA: Diagnosis not present

## 2017-07-01 DIAGNOSIS — Z7901 Long term (current) use of anticoagulants: Secondary | ICD-10-CM | POA: Diagnosis not present

## 2017-07-01 DIAGNOSIS — Z950 Presence of cardiac pacemaker: Secondary | ICD-10-CM | POA: Diagnosis not present

## 2017-07-01 DIAGNOSIS — Z794 Long term (current) use of insulin: Secondary | ICD-10-CM | POA: Diagnosis not present

## 2017-07-01 DIAGNOSIS — I509 Heart failure, unspecified: Secondary | ICD-10-CM | POA: Diagnosis not present

## 2017-07-01 DIAGNOSIS — I110 Hypertensive heart disease with heart failure: Secondary | ICD-10-CM | POA: Diagnosis not present

## 2017-07-04 DIAGNOSIS — I509 Heart failure, unspecified: Secondary | ICD-10-CM | POA: Diagnosis not present

## 2017-07-04 DIAGNOSIS — Z96651 Presence of right artificial knee joint: Secondary | ICD-10-CM | POA: Diagnosis not present

## 2017-07-04 DIAGNOSIS — Z950 Presence of cardiac pacemaker: Secondary | ICD-10-CM | POA: Diagnosis not present

## 2017-07-04 DIAGNOSIS — Z7901 Long term (current) use of anticoagulants: Secondary | ICD-10-CM | POA: Diagnosis not present

## 2017-07-04 DIAGNOSIS — Z5181 Encounter for therapeutic drug level monitoring: Secondary | ICD-10-CM | POA: Diagnosis not present

## 2017-07-04 DIAGNOSIS — R2689 Other abnormalities of gait and mobility: Secondary | ICD-10-CM | POA: Diagnosis not present

## 2017-07-04 DIAGNOSIS — Z9181 History of falling: Secondary | ICD-10-CM | POA: Diagnosis not present

## 2017-07-04 DIAGNOSIS — Z794 Long term (current) use of insulin: Secondary | ICD-10-CM | POA: Diagnosis not present

## 2017-07-04 DIAGNOSIS — E119 Type 2 diabetes mellitus without complications: Secondary | ICD-10-CM | POA: Diagnosis not present

## 2017-07-04 DIAGNOSIS — I110 Hypertensive heart disease with heart failure: Secondary | ICD-10-CM | POA: Diagnosis not present

## 2017-07-05 DIAGNOSIS — R2689 Other abnormalities of gait and mobility: Secondary | ICD-10-CM | POA: Diagnosis not present

## 2017-07-05 DIAGNOSIS — I110 Hypertensive heart disease with heart failure: Secondary | ICD-10-CM | POA: Diagnosis not present

## 2017-07-05 DIAGNOSIS — Z794 Long term (current) use of insulin: Secondary | ICD-10-CM | POA: Diagnosis not present

## 2017-07-05 DIAGNOSIS — Z950 Presence of cardiac pacemaker: Secondary | ICD-10-CM | POA: Diagnosis not present

## 2017-07-05 DIAGNOSIS — E119 Type 2 diabetes mellitus without complications: Secondary | ICD-10-CM | POA: Diagnosis not present

## 2017-07-05 DIAGNOSIS — Z5181 Encounter for therapeutic drug level monitoring: Secondary | ICD-10-CM | POA: Diagnosis not present

## 2017-07-05 DIAGNOSIS — Z96651 Presence of right artificial knee joint: Secondary | ICD-10-CM | POA: Diagnosis not present

## 2017-07-05 DIAGNOSIS — Z7901 Long term (current) use of anticoagulants: Secondary | ICD-10-CM | POA: Diagnosis not present

## 2017-07-05 DIAGNOSIS — I509 Heart failure, unspecified: Secondary | ICD-10-CM | POA: Diagnosis not present

## 2017-07-05 DIAGNOSIS — Z9181 History of falling: Secondary | ICD-10-CM | POA: Diagnosis not present

## 2017-07-06 ENCOUNTER — Other Ambulatory Visit: Payer: Self-pay | Admitting: Family Medicine

## 2017-07-06 ENCOUNTER — Encounter: Payer: Self-pay | Admitting: Family Medicine

## 2017-07-07 DIAGNOSIS — Z5181 Encounter for therapeutic drug level monitoring: Secondary | ICD-10-CM | POA: Diagnosis not present

## 2017-07-07 DIAGNOSIS — Z950 Presence of cardiac pacemaker: Secondary | ICD-10-CM | POA: Diagnosis not present

## 2017-07-07 DIAGNOSIS — Z96651 Presence of right artificial knee joint: Secondary | ICD-10-CM | POA: Diagnosis not present

## 2017-07-07 DIAGNOSIS — Z794 Long term (current) use of insulin: Secondary | ICD-10-CM | POA: Diagnosis not present

## 2017-07-07 DIAGNOSIS — Z9181 History of falling: Secondary | ICD-10-CM | POA: Diagnosis not present

## 2017-07-07 DIAGNOSIS — Z7901 Long term (current) use of anticoagulants: Secondary | ICD-10-CM | POA: Diagnosis not present

## 2017-07-07 DIAGNOSIS — E119 Type 2 diabetes mellitus without complications: Secondary | ICD-10-CM | POA: Diagnosis not present

## 2017-07-07 DIAGNOSIS — I509 Heart failure, unspecified: Secondary | ICD-10-CM | POA: Diagnosis not present

## 2017-07-07 DIAGNOSIS — I110 Hypertensive heart disease with heart failure: Secondary | ICD-10-CM | POA: Diagnosis not present

## 2017-07-07 DIAGNOSIS — R2689 Other abnormalities of gait and mobility: Secondary | ICD-10-CM | POA: Diagnosis not present

## 2017-07-08 ENCOUNTER — Ambulatory Visit (INDEPENDENT_AMBULATORY_CARE_PROVIDER_SITE_OTHER): Payer: Medicare Other | Admitting: Pharmacist

## 2017-07-08 DIAGNOSIS — Z96651 Presence of right artificial knee joint: Secondary | ICD-10-CM | POA: Diagnosis not present

## 2017-07-08 DIAGNOSIS — E119 Type 2 diabetes mellitus without complications: Secondary | ICD-10-CM | POA: Diagnosis not present

## 2017-07-08 DIAGNOSIS — Z950 Presence of cardiac pacemaker: Secondary | ICD-10-CM | POA: Diagnosis not present

## 2017-07-08 DIAGNOSIS — Z5181 Encounter for therapeutic drug level monitoring: Secondary | ICD-10-CM | POA: Diagnosis not present

## 2017-07-08 DIAGNOSIS — Z9181 History of falling: Secondary | ICD-10-CM | POA: Diagnosis not present

## 2017-07-08 DIAGNOSIS — Z794 Long term (current) use of insulin: Secondary | ICD-10-CM | POA: Diagnosis not present

## 2017-07-08 DIAGNOSIS — R2689 Other abnormalities of gait and mobility: Secondary | ICD-10-CM | POA: Diagnosis not present

## 2017-07-08 DIAGNOSIS — Z7901 Long term (current) use of anticoagulants: Secondary | ICD-10-CM | POA: Diagnosis not present

## 2017-07-08 DIAGNOSIS — I509 Heart failure, unspecified: Secondary | ICD-10-CM | POA: Diagnosis not present

## 2017-07-08 DIAGNOSIS — I110 Hypertensive heart disease with heart failure: Secondary | ICD-10-CM | POA: Diagnosis not present

## 2017-07-08 LAB — PROTIME-INR: INR: 3 — AB (ref <=1.1)

## 2017-07-09 IMAGING — DX DG HAND COMPLETE 3+V*R*
3 series · 3 of 3 positions shown · non-contrast
Comparison: None.

CLINICAL DATA: Fell on outstretched hand 3-4 weeks ago

EXAM:
RIGHT HAND - COMPLETE 3+ VIEW

[hand pa]
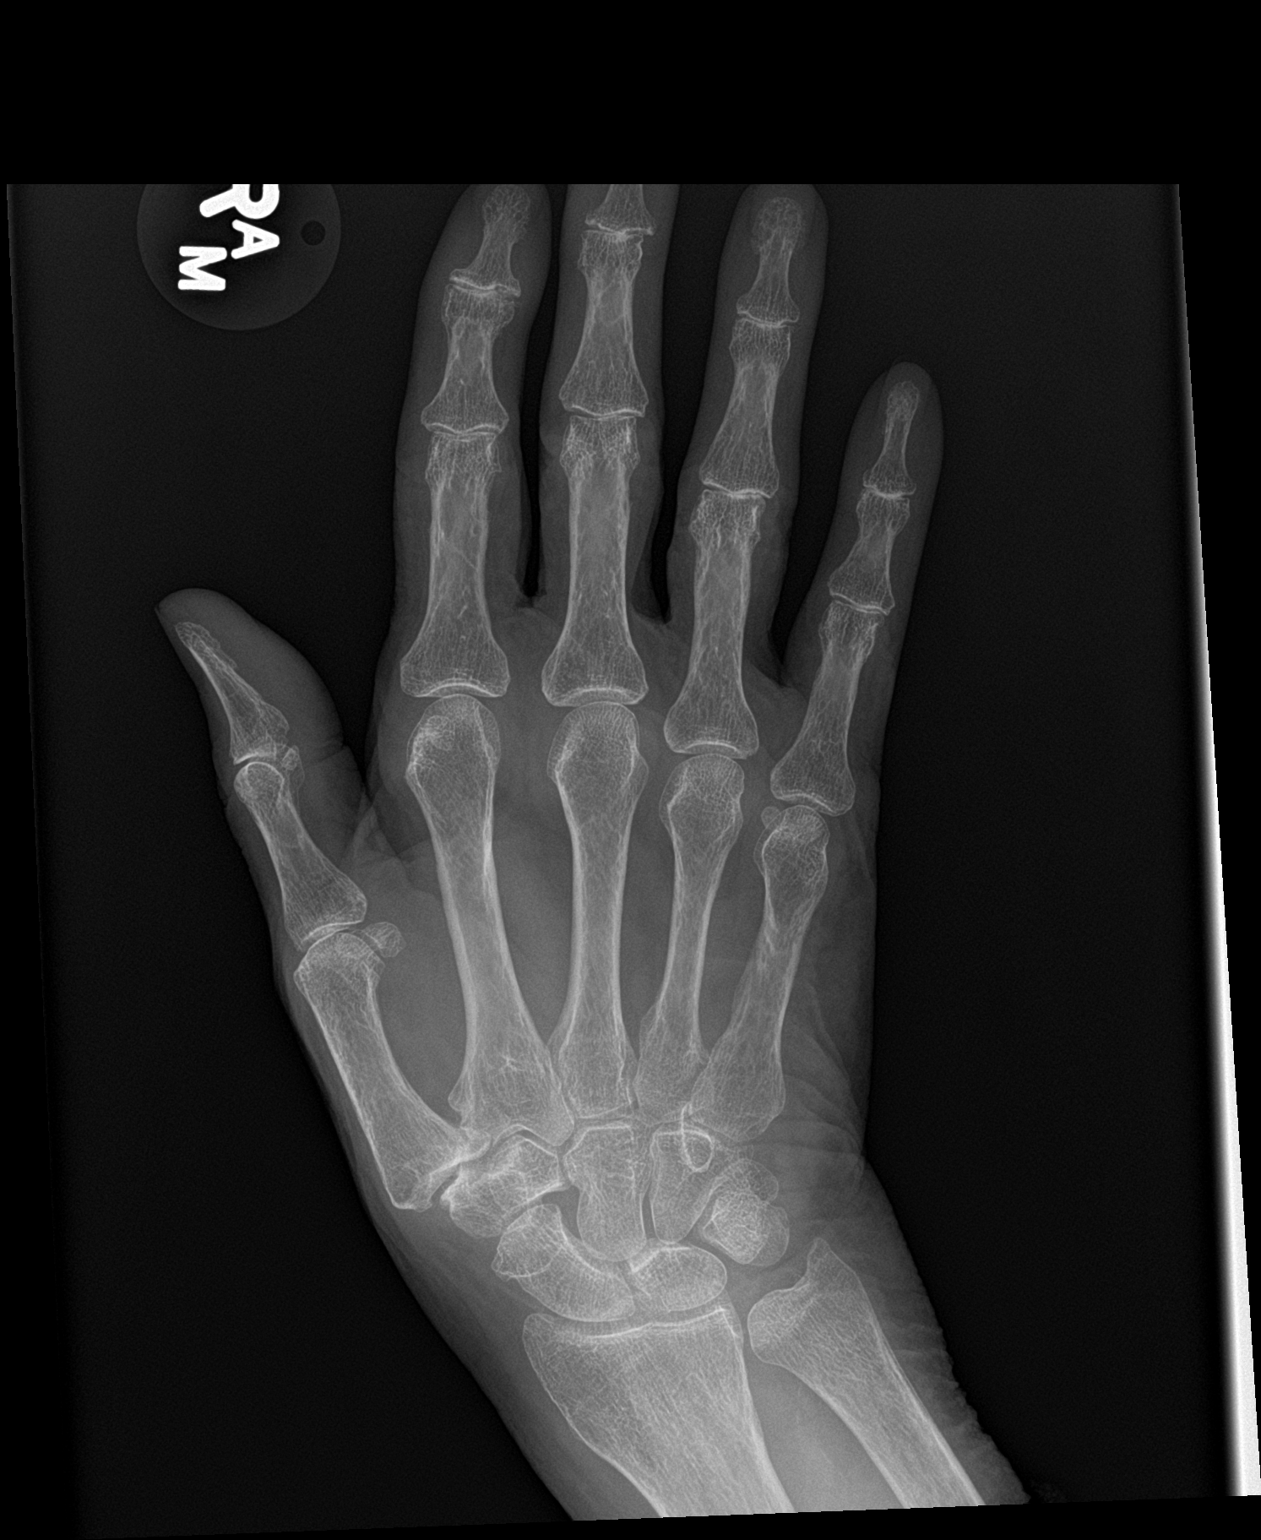

[hand obl]
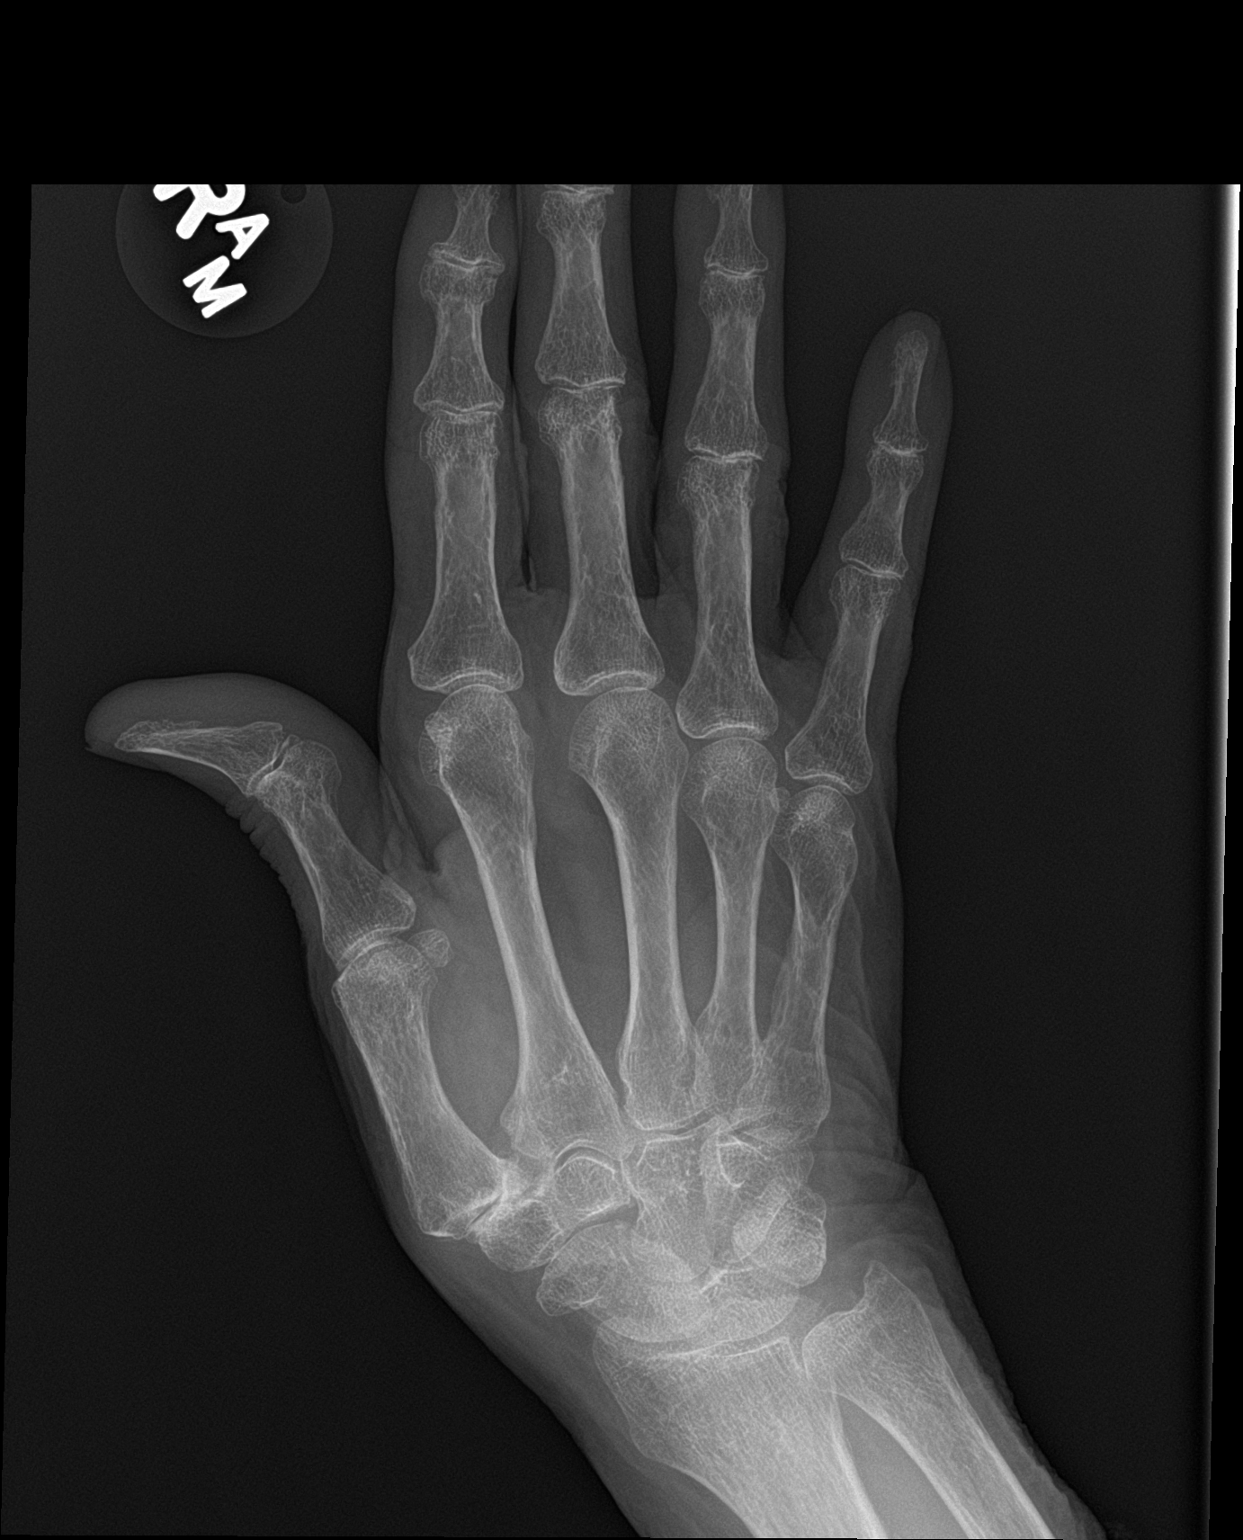

[hand lat]
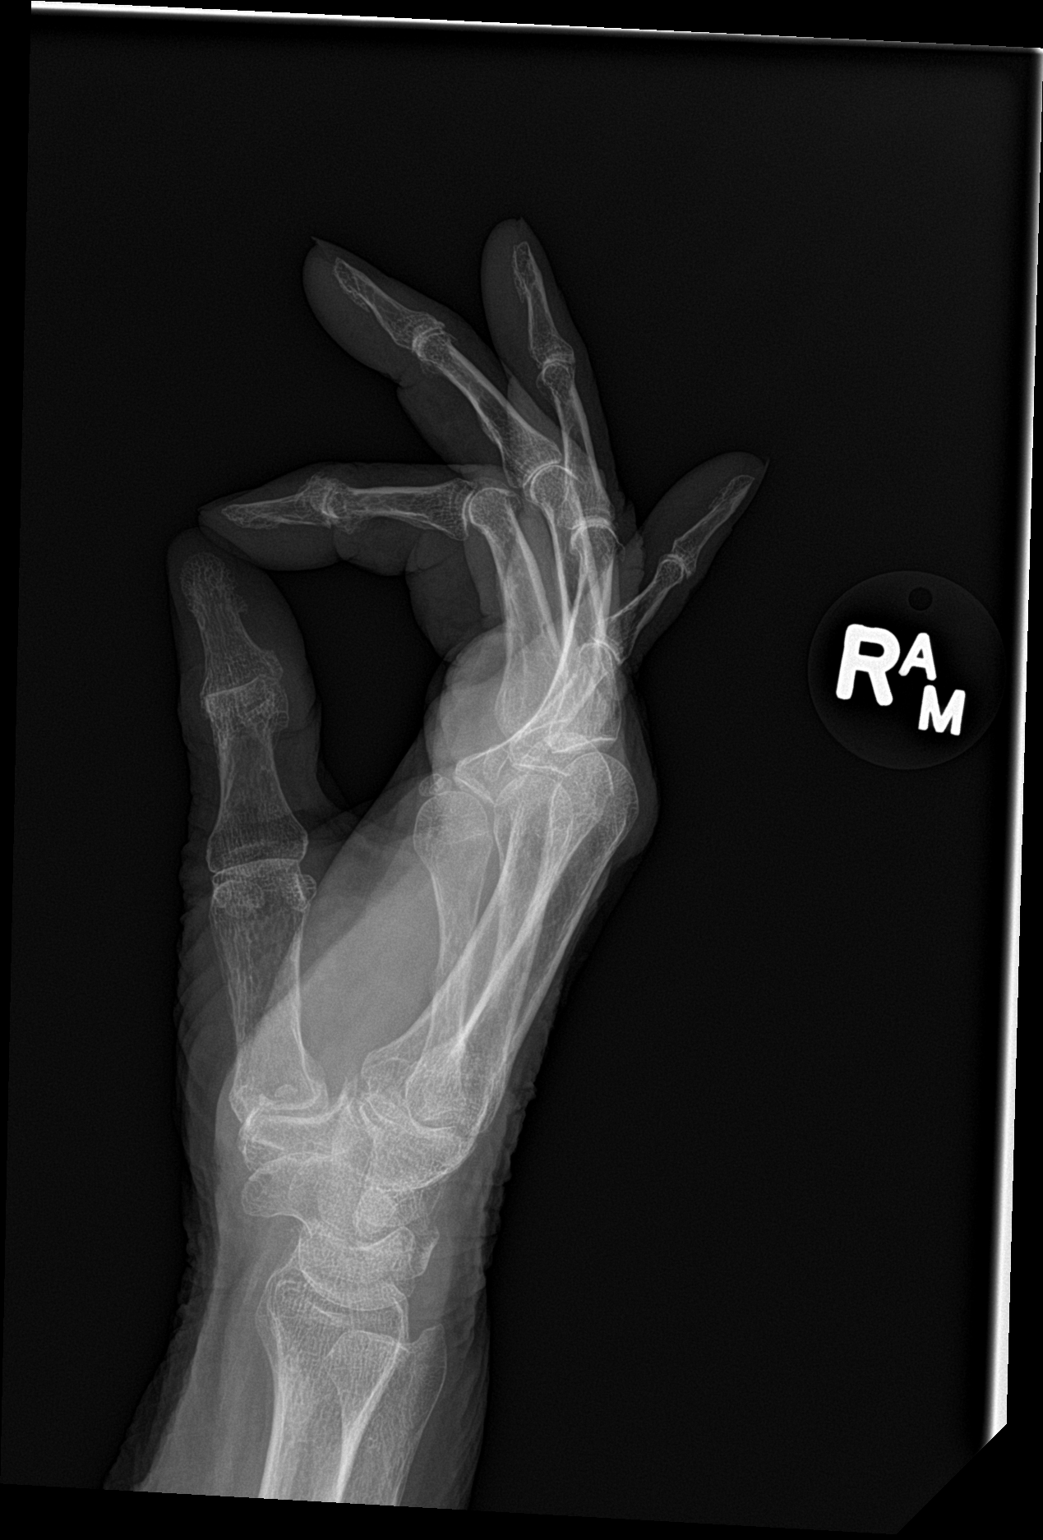

[3 of 3 positions shown; findings below may reference images not displayed]

FINDINGS: No acute fracture is seen. The radiocarpal joint space appears
normal. There are degenerative changes of the right first CMC joint.
MCP joints are unremarkable. There are degenerative changes
diffusely throughout the DIP and PIP joints. No erosion is seen.
IMPRESSION: 1. No acute fracture.
2. Degenerative change of the right first CMC joint as well as the
DIP and PIP joints diffusely. No erosion.

## 2017-07-09 IMAGING — DX DG TOE GREAT 2+V*R*
3 series · 3 of 3 positions shown · non-contrast
Comparison: Right great toe films of 12/06/2016 and right foot
films of 01/12/2016

CLINICAL DATA: Evaluate fracture

EXAM:
RIGHT GREAT TOE

[toe ap]
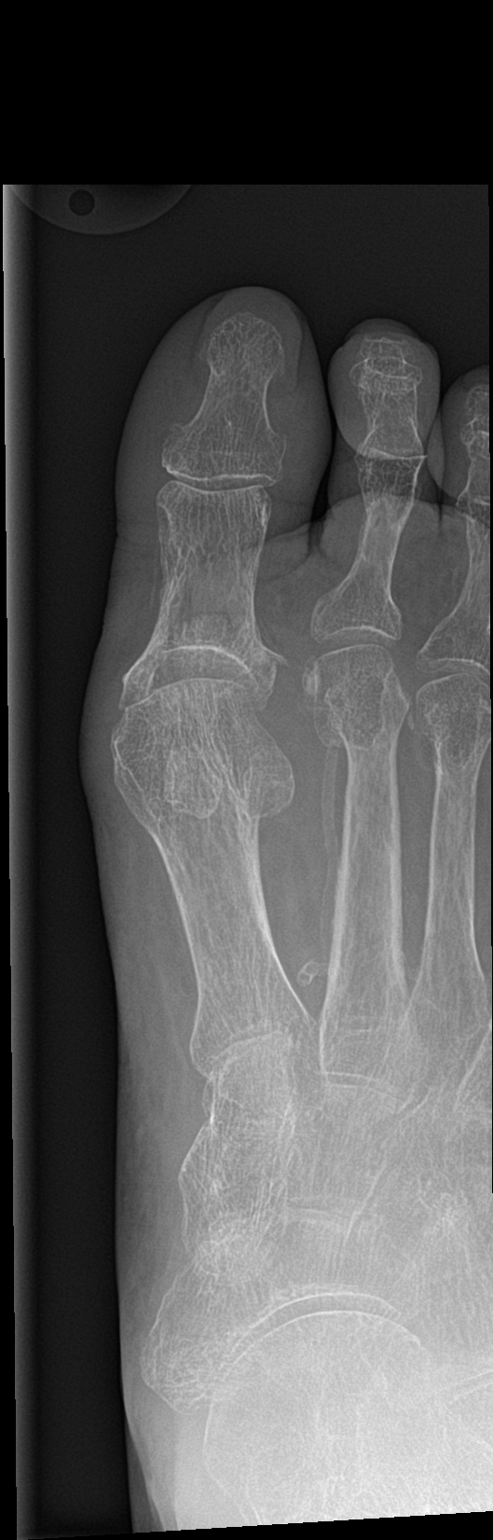

[toe obl]
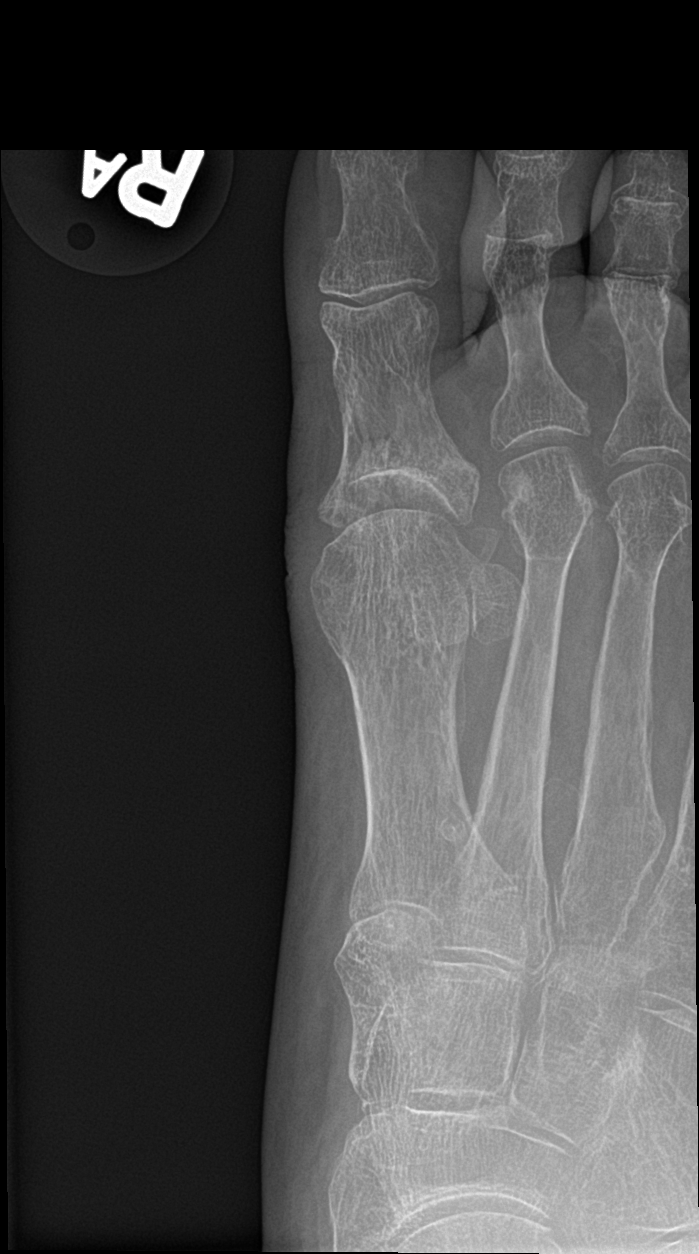

[toe lat]
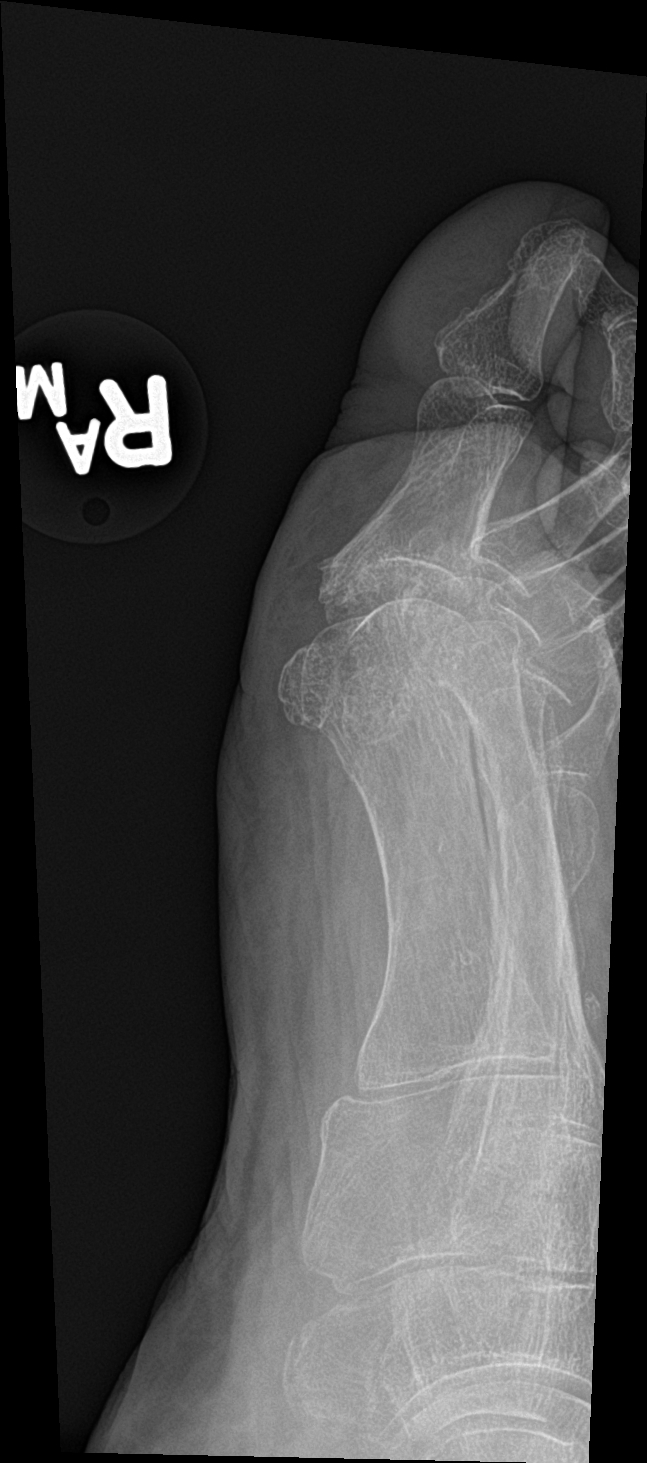

[3 of 3 positions shown; findings below may reference images not displayed]

FINDINGS: The nondisplaced fracture through the base of the proximal phalanx
of the right great toe has undergone some interval healing. On the
lateral view, on which it was best seen previously, the fracture
line is not definitely visible. The bones are diffusely osteopenic.
IMPRESSION: The fracture through the base of the proximal phalanx of the right
great toe is not as visible indicating some interval healing.

## 2017-07-11 DIAGNOSIS — Z5181 Encounter for therapeutic drug level monitoring: Secondary | ICD-10-CM | POA: Diagnosis not present

## 2017-07-11 DIAGNOSIS — Z96651 Presence of right artificial knee joint: Secondary | ICD-10-CM | POA: Diagnosis not present

## 2017-07-11 DIAGNOSIS — Z950 Presence of cardiac pacemaker: Secondary | ICD-10-CM | POA: Diagnosis not present

## 2017-07-11 DIAGNOSIS — Z794 Long term (current) use of insulin: Secondary | ICD-10-CM | POA: Diagnosis not present

## 2017-07-11 DIAGNOSIS — E119 Type 2 diabetes mellitus without complications: Secondary | ICD-10-CM | POA: Diagnosis not present

## 2017-07-11 DIAGNOSIS — Z9181 History of falling: Secondary | ICD-10-CM | POA: Diagnosis not present

## 2017-07-11 DIAGNOSIS — R2689 Other abnormalities of gait and mobility: Secondary | ICD-10-CM | POA: Diagnosis not present

## 2017-07-11 DIAGNOSIS — I110 Hypertensive heart disease with heart failure: Secondary | ICD-10-CM | POA: Diagnosis not present

## 2017-07-11 DIAGNOSIS — Z7901 Long term (current) use of anticoagulants: Secondary | ICD-10-CM | POA: Diagnosis not present

## 2017-07-11 DIAGNOSIS — I509 Heart failure, unspecified: Secondary | ICD-10-CM | POA: Diagnosis not present

## 2017-07-14 ENCOUNTER — Telehealth: Payer: Self-pay

## 2017-07-14 DIAGNOSIS — Z96651 Presence of right artificial knee joint: Secondary | ICD-10-CM | POA: Diagnosis not present

## 2017-07-14 DIAGNOSIS — Z7901 Long term (current) use of anticoagulants: Secondary | ICD-10-CM | POA: Diagnosis not present

## 2017-07-14 DIAGNOSIS — I110 Hypertensive heart disease with heart failure: Secondary | ICD-10-CM | POA: Diagnosis not present

## 2017-07-14 DIAGNOSIS — E119 Type 2 diabetes mellitus without complications: Secondary | ICD-10-CM | POA: Diagnosis not present

## 2017-07-14 DIAGNOSIS — I509 Heart failure, unspecified: Secondary | ICD-10-CM | POA: Diagnosis not present

## 2017-07-14 DIAGNOSIS — Z5181 Encounter for therapeutic drug level monitoring: Secondary | ICD-10-CM | POA: Diagnosis not present

## 2017-07-14 DIAGNOSIS — Z794 Long term (current) use of insulin: Secondary | ICD-10-CM | POA: Diagnosis not present

## 2017-07-14 DIAGNOSIS — Z950 Presence of cardiac pacemaker: Secondary | ICD-10-CM | POA: Diagnosis not present

## 2017-07-14 DIAGNOSIS — Z9181 History of falling: Secondary | ICD-10-CM | POA: Diagnosis not present

## 2017-07-14 DIAGNOSIS — R2689 Other abnormalities of gait and mobility: Secondary | ICD-10-CM | POA: Diagnosis not present

## 2017-07-14 NOTE — Telephone Encounter (Signed)
OK , continue current dose and recheck in 2 weeks.

## 2017-07-14 NOTE — Telephone Encounter (Signed)
[facility] nurse called stating pt current INR is 2.9, pt is still taking antibiotics and no changes to any of her other care. Please advise.

## 2017-07-15 ENCOUNTER — Telehealth: Payer: Self-pay

## 2017-07-15 DIAGNOSIS — Z9181 History of falling: Secondary | ICD-10-CM | POA: Diagnosis not present

## 2017-07-15 DIAGNOSIS — Z5181 Encounter for therapeutic drug level monitoring: Secondary | ICD-10-CM | POA: Diagnosis not present

## 2017-07-15 DIAGNOSIS — I110 Hypertensive heart disease with heart failure: Secondary | ICD-10-CM | POA: Diagnosis not present

## 2017-07-15 DIAGNOSIS — R2689 Other abnormalities of gait and mobility: Secondary | ICD-10-CM | POA: Diagnosis not present

## 2017-07-15 DIAGNOSIS — Z96651 Presence of right artificial knee joint: Secondary | ICD-10-CM | POA: Diagnosis not present

## 2017-07-15 DIAGNOSIS — Z950 Presence of cardiac pacemaker: Secondary | ICD-10-CM | POA: Diagnosis not present

## 2017-07-15 DIAGNOSIS — Z7901 Long term (current) use of anticoagulants: Secondary | ICD-10-CM | POA: Diagnosis not present

## 2017-07-15 DIAGNOSIS — I509 Heart failure, unspecified: Secondary | ICD-10-CM | POA: Diagnosis not present

## 2017-07-15 DIAGNOSIS — E119 Type 2 diabetes mellitus without complications: Secondary | ICD-10-CM | POA: Diagnosis not present

## 2017-07-15 DIAGNOSIS — Z794 Long term (current) use of insulin: Secondary | ICD-10-CM | POA: Diagnosis not present

## 2017-07-15 NOTE — Telephone Encounter (Signed)
Daughter of pt called stating pt has reached her gap for medications. Daughter states pt's Lantus and Januvia are too expensive. Is there anything else pt can take that will be a little less expensive.Daughter states they can cover one medication but the Januvia if it can be changed would be helpful. Please advise.

## 2017-07-15 NOTE — Telephone Encounter (Signed)
[facility] nurse notified.

## 2017-07-17 NOTE — Telephone Encounter (Signed)
Lets call drug rep and see if can get samples for Januvia from rep for maybe 2 months worth.  I don't want to use glipizide bc it cause low sugars for her when taking previoulsy.

## 2017-07-18 DIAGNOSIS — Z794 Long term (current) use of insulin: Secondary | ICD-10-CM | POA: Diagnosis not present

## 2017-07-18 DIAGNOSIS — E119 Type 2 diabetes mellitus without complications: Secondary | ICD-10-CM | POA: Diagnosis not present

## 2017-07-18 DIAGNOSIS — Z9181 History of falling: Secondary | ICD-10-CM | POA: Diagnosis not present

## 2017-07-18 DIAGNOSIS — R2689 Other abnormalities of gait and mobility: Secondary | ICD-10-CM | POA: Diagnosis not present

## 2017-07-18 DIAGNOSIS — Z7901 Long term (current) use of anticoagulants: Secondary | ICD-10-CM | POA: Diagnosis not present

## 2017-07-18 DIAGNOSIS — Z96651 Presence of right artificial knee joint: Secondary | ICD-10-CM | POA: Diagnosis not present

## 2017-07-18 DIAGNOSIS — Z5181 Encounter for therapeutic drug level monitoring: Secondary | ICD-10-CM | POA: Diagnosis not present

## 2017-07-18 DIAGNOSIS — I509 Heart failure, unspecified: Secondary | ICD-10-CM | POA: Diagnosis not present

## 2017-07-18 DIAGNOSIS — I110 Hypertensive heart disease with heart failure: Secondary | ICD-10-CM | POA: Diagnosis not present

## 2017-07-18 DIAGNOSIS — Z950 Presence of cardiac pacemaker: Secondary | ICD-10-CM | POA: Diagnosis not present

## 2017-07-20 DIAGNOSIS — Z950 Presence of cardiac pacemaker: Secondary | ICD-10-CM | POA: Diagnosis not present

## 2017-07-20 DIAGNOSIS — Z5181 Encounter for therapeutic drug level monitoring: Secondary | ICD-10-CM | POA: Diagnosis not present

## 2017-07-20 DIAGNOSIS — I110 Hypertensive heart disease with heart failure: Secondary | ICD-10-CM | POA: Diagnosis not present

## 2017-07-20 DIAGNOSIS — R2689 Other abnormalities of gait and mobility: Secondary | ICD-10-CM | POA: Diagnosis not present

## 2017-07-20 DIAGNOSIS — Z794 Long term (current) use of insulin: Secondary | ICD-10-CM | POA: Diagnosis not present

## 2017-07-20 DIAGNOSIS — Z96651 Presence of right artificial knee joint: Secondary | ICD-10-CM | POA: Diagnosis not present

## 2017-07-20 DIAGNOSIS — I509 Heart failure, unspecified: Secondary | ICD-10-CM | POA: Diagnosis not present

## 2017-07-20 DIAGNOSIS — Z9181 History of falling: Secondary | ICD-10-CM | POA: Diagnosis not present

## 2017-07-20 DIAGNOSIS — E119 Type 2 diabetes mellitus without complications: Secondary | ICD-10-CM | POA: Diagnosis not present

## 2017-07-20 DIAGNOSIS — Z7901 Long term (current) use of anticoagulants: Secondary | ICD-10-CM | POA: Diagnosis not present

## 2017-07-20 NOTE — Telephone Encounter (Signed)
Samples will be here by next Tuesday. Pt daughter advised to download the free 30 day supply off [place] web site. -EH/RMA

## 2017-07-22 ENCOUNTER — Other Ambulatory Visit: Payer: Self-pay | Admitting: Family Medicine

## 2017-07-22 DIAGNOSIS — Z5181 Encounter for therapeutic drug level monitoring: Secondary | ICD-10-CM | POA: Diagnosis not present

## 2017-07-22 DIAGNOSIS — Z794 Long term (current) use of insulin: Secondary | ICD-10-CM | POA: Diagnosis not present

## 2017-07-22 DIAGNOSIS — R2689 Other abnormalities of gait and mobility: Secondary | ICD-10-CM | POA: Diagnosis not present

## 2017-07-22 DIAGNOSIS — E119 Type 2 diabetes mellitus without complications: Secondary | ICD-10-CM | POA: Diagnosis not present

## 2017-07-22 DIAGNOSIS — Z7901 Long term (current) use of anticoagulants: Secondary | ICD-10-CM | POA: Diagnosis not present

## 2017-07-22 DIAGNOSIS — Z950 Presence of cardiac pacemaker: Secondary | ICD-10-CM | POA: Diagnosis not present

## 2017-07-22 DIAGNOSIS — I509 Heart failure, unspecified: Secondary | ICD-10-CM | POA: Diagnosis not present

## 2017-07-22 DIAGNOSIS — Z9181 History of falling: Secondary | ICD-10-CM | POA: Diagnosis not present

## 2017-07-22 DIAGNOSIS — I110 Hypertensive heart disease with heart failure: Secondary | ICD-10-CM | POA: Diagnosis not present

## 2017-07-22 DIAGNOSIS — Z96651 Presence of right artificial knee joint: Secondary | ICD-10-CM | POA: Diagnosis not present

## 2017-07-25 ENCOUNTER — Other Ambulatory Visit: Payer: Self-pay

## 2017-07-25 DIAGNOSIS — Z9181 History of falling: Secondary | ICD-10-CM | POA: Diagnosis not present

## 2017-07-25 DIAGNOSIS — I110 Hypertensive heart disease with heart failure: Secondary | ICD-10-CM | POA: Diagnosis not present

## 2017-07-25 DIAGNOSIS — Z96651 Presence of right artificial knee joint: Secondary | ICD-10-CM | POA: Diagnosis not present

## 2017-07-25 DIAGNOSIS — Z5181 Encounter for therapeutic drug level monitoring: Secondary | ICD-10-CM | POA: Diagnosis not present

## 2017-07-25 DIAGNOSIS — E119 Type 2 diabetes mellitus without complications: Secondary | ICD-10-CM | POA: Diagnosis not present

## 2017-07-25 DIAGNOSIS — Z794 Long term (current) use of insulin: Principal | ICD-10-CM

## 2017-07-25 DIAGNOSIS — E1143 Type 2 diabetes mellitus with diabetic autonomic (poly)neuropathy: Secondary | ICD-10-CM

## 2017-07-25 DIAGNOSIS — Z7901 Long term (current) use of anticoagulants: Secondary | ICD-10-CM | POA: Diagnosis not present

## 2017-07-25 DIAGNOSIS — R2689 Other abnormalities of gait and mobility: Secondary | ICD-10-CM | POA: Diagnosis not present

## 2017-07-25 DIAGNOSIS — I509 Heart failure, unspecified: Secondary | ICD-10-CM | POA: Diagnosis not present

## 2017-07-25 DIAGNOSIS — Z950 Presence of cardiac pacemaker: Secondary | ICD-10-CM | POA: Diagnosis not present

## 2017-07-25 MED ORDER — GLUCOSE BLOOD VI STRP: ORAL_STRIP | 4 refills | Status: DC

## 2017-07-27 ENCOUNTER — Other Ambulatory Visit: Payer: Self-pay | Admitting: *Deleted

## 2017-07-27 DIAGNOSIS — Z794 Long term (current) use of insulin: Principal | ICD-10-CM

## 2017-07-27 DIAGNOSIS — E1143 Type 2 diabetes mellitus with diabetic autonomic (poly)neuropathy: Secondary | ICD-10-CM

## 2017-07-27 MED ORDER — GLUCOSE BLOOD VI STRP: ORAL_STRIP | 0 refills | Status: DC

## 2017-07-28 ENCOUNTER — Ambulatory Visit (INDEPENDENT_AMBULATORY_CARE_PROVIDER_SITE_OTHER): Payer: Medicare Other | Admitting: Pharmacist Clinician (PhC)/ Clinical Pharmacy Specialist

## 2017-07-28 DIAGNOSIS — I509 Heart failure, unspecified: Secondary | ICD-10-CM | POA: Diagnosis not present

## 2017-07-28 DIAGNOSIS — E119 Type 2 diabetes mellitus without complications: Secondary | ICD-10-CM | POA: Diagnosis not present

## 2017-07-28 DIAGNOSIS — Z950 Presence of cardiac pacemaker: Secondary | ICD-10-CM | POA: Diagnosis not present

## 2017-07-28 DIAGNOSIS — Z7901 Long term (current) use of anticoagulants: Secondary | ICD-10-CM

## 2017-07-28 DIAGNOSIS — Z5181 Encounter for therapeutic drug level monitoring: Secondary | ICD-10-CM | POA: Diagnosis not present

## 2017-07-28 DIAGNOSIS — Z96651 Presence of right artificial knee joint: Secondary | ICD-10-CM | POA: Diagnosis not present

## 2017-07-28 DIAGNOSIS — R2689 Other abnormalities of gait and mobility: Secondary | ICD-10-CM | POA: Diagnosis not present

## 2017-07-28 DIAGNOSIS — Z9181 History of falling: Secondary | ICD-10-CM | POA: Diagnosis not present

## 2017-07-28 DIAGNOSIS — I110 Hypertensive heart disease with heart failure: Secondary | ICD-10-CM | POA: Diagnosis not present

## 2017-07-28 DIAGNOSIS — Z794 Long term (current) use of insulin: Secondary | ICD-10-CM | POA: Diagnosis not present

## 2017-07-28 LAB — POCT INR: INR: 2.4

## 2017-07-29 DIAGNOSIS — I482 Chronic atrial fibrillation: Secondary | ICD-10-CM | POA: Diagnosis not present

## 2017-07-29 DIAGNOSIS — T8451XD Infection and inflammatory reaction due to internal right hip prosthesis, subsequent encounter: Secondary | ICD-10-CM | POA: Diagnosis not present

## 2017-07-29 DIAGNOSIS — E119 Type 2 diabetes mellitus without complications: Secondary | ICD-10-CM | POA: Diagnosis not present

## 2017-07-29 DIAGNOSIS — Z888 Allergy status to other drugs, medicaments and biological substances status: Secondary | ICD-10-CM | POA: Diagnosis not present

## 2017-07-29 DIAGNOSIS — Z884 Allergy status to anesthetic agent status: Secondary | ICD-10-CM | POA: Diagnosis not present

## 2017-07-29 DIAGNOSIS — B952 Enterococcus as the cause of diseases classified elsewhere: Secondary | ICD-10-CM | POA: Diagnosis not present

## 2017-07-29 DIAGNOSIS — T8453XD Infection and inflammatory reaction due to internal right knee prosthesis, subsequent encounter: Secondary | ICD-10-CM | POA: Diagnosis not present

## 2017-07-29 DIAGNOSIS — T847XXD Infection and inflammatory reaction due to other internal orthopedic prosthetic devices, implants and grafts, subsequent encounter: Secondary | ICD-10-CM | POA: Diagnosis not present

## 2017-07-29 DIAGNOSIS — I10 Essential (primary) hypertension: Secondary | ICD-10-CM | POA: Diagnosis not present

## 2017-08-01 DIAGNOSIS — R2689 Other abnormalities of gait and mobility: Secondary | ICD-10-CM | POA: Diagnosis not present

## 2017-08-01 DIAGNOSIS — I110 Hypertensive heart disease with heart failure: Secondary | ICD-10-CM | POA: Diagnosis not present

## 2017-08-01 DIAGNOSIS — Z5181 Encounter for therapeutic drug level monitoring: Secondary | ICD-10-CM | POA: Diagnosis not present

## 2017-08-01 DIAGNOSIS — Z794 Long term (current) use of insulin: Secondary | ICD-10-CM | POA: Diagnosis not present

## 2017-08-01 DIAGNOSIS — Z96651 Presence of right artificial knee joint: Secondary | ICD-10-CM | POA: Diagnosis not present

## 2017-08-01 DIAGNOSIS — Z7901 Long term (current) use of anticoagulants: Secondary | ICD-10-CM | POA: Diagnosis not present

## 2017-08-01 DIAGNOSIS — I509 Heart failure, unspecified: Secondary | ICD-10-CM | POA: Diagnosis not present

## 2017-08-01 DIAGNOSIS — Z950 Presence of cardiac pacemaker: Secondary | ICD-10-CM | POA: Diagnosis not present

## 2017-08-01 DIAGNOSIS — E119 Type 2 diabetes mellitus without complications: Secondary | ICD-10-CM | POA: Diagnosis not present

## 2017-08-01 DIAGNOSIS — Z9181 History of falling: Secondary | ICD-10-CM | POA: Diagnosis not present

## 2017-08-03 DIAGNOSIS — Z950 Presence of cardiac pacemaker: Secondary | ICD-10-CM | POA: Diagnosis not present

## 2017-08-03 DIAGNOSIS — R2689 Other abnormalities of gait and mobility: Secondary | ICD-10-CM | POA: Diagnosis not present

## 2017-08-03 DIAGNOSIS — E119 Type 2 diabetes mellitus without complications: Secondary | ICD-10-CM | POA: Diagnosis not present

## 2017-08-03 DIAGNOSIS — Z96651 Presence of right artificial knee joint: Secondary | ICD-10-CM | POA: Diagnosis not present

## 2017-08-03 DIAGNOSIS — Z9181 History of falling: Secondary | ICD-10-CM | POA: Diagnosis not present

## 2017-08-03 DIAGNOSIS — I509 Heart failure, unspecified: Secondary | ICD-10-CM | POA: Diagnosis not present

## 2017-08-03 DIAGNOSIS — Z794 Long term (current) use of insulin: Secondary | ICD-10-CM | POA: Diagnosis not present

## 2017-08-03 DIAGNOSIS — Z5181 Encounter for therapeutic drug level monitoring: Secondary | ICD-10-CM | POA: Diagnosis not present

## 2017-08-03 DIAGNOSIS — I110 Hypertensive heart disease with heart failure: Secondary | ICD-10-CM | POA: Diagnosis not present

## 2017-08-03 DIAGNOSIS — Z7901 Long term (current) use of anticoagulants: Secondary | ICD-10-CM | POA: Diagnosis not present

## 2017-08-04 ENCOUNTER — Ambulatory Visit (INDEPENDENT_AMBULATORY_CARE_PROVIDER_SITE_OTHER): Payer: Medicare Other | Admitting: Pharmacist

## 2017-08-04 DIAGNOSIS — I509 Heart failure, unspecified: Secondary | ICD-10-CM | POA: Diagnosis not present

## 2017-08-04 DIAGNOSIS — E119 Type 2 diabetes mellitus without complications: Secondary | ICD-10-CM | POA: Diagnosis not present

## 2017-08-04 DIAGNOSIS — Z96651 Presence of right artificial knee joint: Secondary | ICD-10-CM | POA: Diagnosis not present

## 2017-08-04 DIAGNOSIS — Z794 Long term (current) use of insulin: Secondary | ICD-10-CM | POA: Diagnosis not present

## 2017-08-04 DIAGNOSIS — Z5181 Encounter for therapeutic drug level monitoring: Secondary | ICD-10-CM | POA: Diagnosis not present

## 2017-08-04 DIAGNOSIS — Z9181 History of falling: Secondary | ICD-10-CM | POA: Diagnosis not present

## 2017-08-04 DIAGNOSIS — I110 Hypertensive heart disease with heart failure: Secondary | ICD-10-CM | POA: Diagnosis not present

## 2017-08-04 DIAGNOSIS — Z7901 Long term (current) use of anticoagulants: Secondary | ICD-10-CM | POA: Diagnosis not present

## 2017-08-04 DIAGNOSIS — R2689 Other abnormalities of gait and mobility: Secondary | ICD-10-CM | POA: Diagnosis not present

## 2017-08-04 DIAGNOSIS — Z950 Presence of cardiac pacemaker: Secondary | ICD-10-CM | POA: Diagnosis not present

## 2017-08-04 LAB — PROTIME-INR: INR: 2.4 — AB (ref <=1.1)

## 2017-08-05 DIAGNOSIS — Z7901 Long term (current) use of anticoagulants: Secondary | ICD-10-CM | POA: Diagnosis not present

## 2017-08-05 DIAGNOSIS — Z96651 Presence of right artificial knee joint: Secondary | ICD-10-CM | POA: Diagnosis not present

## 2017-08-05 DIAGNOSIS — R2689 Other abnormalities of gait and mobility: Secondary | ICD-10-CM | POA: Diagnosis not present

## 2017-08-05 DIAGNOSIS — I509 Heart failure, unspecified: Secondary | ICD-10-CM | POA: Diagnosis not present

## 2017-08-05 DIAGNOSIS — Z5181 Encounter for therapeutic drug level monitoring: Secondary | ICD-10-CM | POA: Diagnosis not present

## 2017-08-05 DIAGNOSIS — Z9181 History of falling: Secondary | ICD-10-CM | POA: Diagnosis not present

## 2017-08-05 DIAGNOSIS — I110 Hypertensive heart disease with heart failure: Secondary | ICD-10-CM | POA: Diagnosis not present

## 2017-08-05 DIAGNOSIS — Z794 Long term (current) use of insulin: Secondary | ICD-10-CM | POA: Diagnosis not present

## 2017-08-05 DIAGNOSIS — E119 Type 2 diabetes mellitus without complications: Secondary | ICD-10-CM | POA: Diagnosis not present

## 2017-08-05 DIAGNOSIS — Z950 Presence of cardiac pacemaker: Secondary | ICD-10-CM | POA: Diagnosis not present

## 2017-08-09 DIAGNOSIS — Z794 Long term (current) use of insulin: Secondary | ICD-10-CM | POA: Diagnosis not present

## 2017-08-09 DIAGNOSIS — Z96651 Presence of right artificial knee joint: Secondary | ICD-10-CM | POA: Diagnosis not present

## 2017-08-09 DIAGNOSIS — Z5181 Encounter for therapeutic drug level monitoring: Secondary | ICD-10-CM | POA: Diagnosis not present

## 2017-08-09 DIAGNOSIS — I110 Hypertensive heart disease with heart failure: Secondary | ICD-10-CM | POA: Diagnosis not present

## 2017-08-09 DIAGNOSIS — Z7901 Long term (current) use of anticoagulants: Secondary | ICD-10-CM | POA: Diagnosis not present

## 2017-08-09 DIAGNOSIS — I509 Heart failure, unspecified: Secondary | ICD-10-CM | POA: Diagnosis not present

## 2017-08-09 DIAGNOSIS — E119 Type 2 diabetes mellitus without complications: Secondary | ICD-10-CM | POA: Diagnosis not present

## 2017-08-09 DIAGNOSIS — Z950 Presence of cardiac pacemaker: Secondary | ICD-10-CM | POA: Diagnosis not present

## 2017-08-09 DIAGNOSIS — R2689 Other abnormalities of gait and mobility: Secondary | ICD-10-CM | POA: Diagnosis not present

## 2017-08-09 DIAGNOSIS — Z9181 History of falling: Secondary | ICD-10-CM | POA: Diagnosis not present

## 2017-08-10 DIAGNOSIS — Z96651 Presence of right artificial knee joint: Secondary | ICD-10-CM | POA: Diagnosis not present

## 2017-08-10 DIAGNOSIS — Z950 Presence of cardiac pacemaker: Secondary | ICD-10-CM | POA: Diagnosis not present

## 2017-08-10 DIAGNOSIS — Z5181 Encounter for therapeutic drug level monitoring: Secondary | ICD-10-CM | POA: Diagnosis not present

## 2017-08-10 DIAGNOSIS — R2689 Other abnormalities of gait and mobility: Secondary | ICD-10-CM | POA: Diagnosis not present

## 2017-08-10 DIAGNOSIS — E119 Type 2 diabetes mellitus without complications: Secondary | ICD-10-CM | POA: Diagnosis not present

## 2017-08-10 DIAGNOSIS — Z9181 History of falling: Secondary | ICD-10-CM | POA: Diagnosis not present

## 2017-08-10 DIAGNOSIS — Z7901 Long term (current) use of anticoagulants: Secondary | ICD-10-CM | POA: Diagnosis not present

## 2017-08-10 DIAGNOSIS — I110 Hypertensive heart disease with heart failure: Secondary | ICD-10-CM | POA: Diagnosis not present

## 2017-08-10 DIAGNOSIS — Z794 Long term (current) use of insulin: Secondary | ICD-10-CM | POA: Diagnosis not present

## 2017-08-10 DIAGNOSIS — I509 Heart failure, unspecified: Secondary | ICD-10-CM | POA: Diagnosis not present

## 2017-08-16 ENCOUNTER — Telehealth: Payer: Self-pay | Admitting: Pharmacist

## 2017-08-16 DIAGNOSIS — I480 Paroxysmal atrial fibrillation: Secondary | ICD-10-CM

## 2017-08-16 NOTE — Telephone Encounter (Signed)
INR check standing orders sent to [facility] as requested by patient's daughter

## 2017-08-17 ENCOUNTER — Other Ambulatory Visit: Payer: Self-pay | Admitting: Family Medicine

## 2017-08-17 ENCOUNTER — Telehealth: Payer: Self-pay | Admitting: Cardiology

## 2017-08-17 ENCOUNTER — Ambulatory Visit (INDEPENDENT_AMBULATORY_CARE_PROVIDER_SITE_OTHER): Payer: Medicare Other | Admitting: Pharmacist Clinician (PhC)/ Clinical Pharmacy Specialist

## 2017-08-17 ENCOUNTER — Ambulatory Visit (INDEPENDENT_AMBULATORY_CARE_PROVIDER_SITE_OTHER): Payer: Medicare Other | Admitting: Family Medicine

## 2017-08-17 ENCOUNTER — Encounter: Payer: Medicare Other | Admitting: *Deleted

## 2017-08-17 ENCOUNTER — Encounter: Payer: Self-pay | Admitting: Family Medicine

## 2017-08-17 VITALS — BP 139/60 | HR 66

## 2017-08-17 DIAGNOSIS — E039 Hypothyroidism, unspecified: Secondary | ICD-10-CM | POA: Diagnosis not present

## 2017-08-17 DIAGNOSIS — E1142 Type 2 diabetes mellitus with diabetic polyneuropathy: Secondary | ICD-10-CM

## 2017-08-17 DIAGNOSIS — IMO0001 Reserved for inherently not codable concepts without codable children: Secondary | ICD-10-CM

## 2017-08-17 DIAGNOSIS — E1165 Type 2 diabetes mellitus with hyperglycemia: Secondary | ICD-10-CM

## 2017-08-17 DIAGNOSIS — Z794 Long term (current) use of insulin: Secondary | ICD-10-CM

## 2017-08-17 DIAGNOSIS — Z23 Encounter for immunization: Secondary | ICD-10-CM | POA: Diagnosis not present

## 2017-08-17 DIAGNOSIS — N183 Chronic kidney disease, stage 3 (moderate): Secondary | ICD-10-CM | POA: Diagnosis not present

## 2017-08-17 DIAGNOSIS — Z7901 Long term (current) use of anticoagulants: Secondary | ICD-10-CM

## 2017-08-17 DIAGNOSIS — I480 Paroxysmal atrial fibrillation: Secondary | ICD-10-CM | POA: Diagnosis not present

## 2017-08-17 DIAGNOSIS — E1143 Type 2 diabetes mellitus with diabetic autonomic (poly)neuropathy: Secondary | ICD-10-CM

## 2017-08-17 LAB — POCT INR: INR: 2.2

## 2017-08-17 LAB — POCT GLYCOSYLATED HEMOGLOBIN (HGB A1C): Hemoglobin A1C: 9.8

## 2017-08-17 MED ORDER — INSULIN REGULAR HUMAN 100 UNIT/ML IJ SOLN: INTRAMUSCULAR | 1 refills | Status: DC

## 2017-08-17 NOTE — Progress Notes (Signed)
Pt reports that [facility] is not coming out anymore and that she will need to have her INR checked. I checked this in the office for her and she asked that I send her results to Dr. Kennon Holter (Attn: Cyril Mourning) office for f/u. Marland KitchenAudelia Hives [place]

## 2017-08-17 NOTE — Progress Notes (Signed)
Subjective:    CC: DM  HPI:  Diabetes - no hypoglycemic events. No wounds or sores that are not healing well. No increased thirst or urination. Checking glucose at home. Taking medications as prescribed without any side effects. She is using 26 of Lantus in AM and 20 QHS. She reports that she's been very consistent with giving herself her insulin. She did bring in her home glucose log. Most of the morning sugars have been anywhere from normal up to the low 200 range. Evening blood sugars typically run in the 300-400 range.  Hypothyroidism-She has noticed over the last 6-12 months that she's been gaining weight even though she actually eats very little. Mostly she does try to eat 3 meals but sometimes it can even just be a cup of yogurt. Sometimes if she sleeps and gets up late she'll skip breakfast or eat a late breakfast and really only eats twice a day. She feels like her hair is getting a little more thin as well.  Her neuropathy continues to get worse and is making it more difficult for her to ambulate. Physical therapy and occupational therapy as well as nursing was coming out of her home. They decided to put a break on it for another month or 2 since she really was plateauing and then come back out in the fall at that time and see if they can help her continue to progress. She is currently in a wheelchair.   Past medical history, Surgical history, Family history not pertinant except as noted below, Social history, Allergies, and medications have been entered into the medical record, reviewed, and corrections made.   Review of Systems: No fevers, chills, night sweats, weight loss, chest pain, or shortness of breath.   Objective:    General: Well Developed, well nourished, and in no acute distress.  Neuro: Alert and oriented x3, extra-ocular muscles intact, sensation grossly intact.  HEENT: Normocephalic, atraumatic  Skin: Warm and dry, no rashes. Cardiac: Regular rate and rhythm, no  murmurs rubs or gallops, no lower extremity edema.  Respiratory: Clear to auscultation bilaterally. Not using accessory muscles, speaking in full sentences.   Impression and Recommendations:    DM -Uncontrolled. Hemoglobin A1c up to 9.8. At this point she clearly needs mealtime insulin so we will start with 5 units before her evening meal I want her to do that for about 2 weeks and then give me a call with her blood sugar levels that we can make adjustments. Eventually I would like to switch her to let Novolin 70/30 think this would be more cost effective since she is Artie her Medicare gap but since she just filled a months worth of the Lantus had like to go ahead and use that up if possible. Says the importance of not skipping meals as this connection cause hypoglycemia which actually lost the liver to release extra glucose which can then cause hyperglycemia.  Hypothyroidism - she has noticed some abnormal weight gain as well as feeling more cold than usual so we'll go ahead and check her thyroid again. The last time the TSH was just borderline elevated around 5. If it's not maximally controlled will adjust regimen.  Peripheral neuropathy-we'll definitely think would be great for PT and OT to come back out in the fall. Her daughter will continue work on helping her as much as she possibly can. Her daughter lives with her. Her daughter does work during the daytime. She is currently on gabapentin but we could always make  adjustments and take a look at this if needed  Atrial fibrillation-we did do her Coumadin today will get that forwarded to cardiology.

## 2017-08-17 NOTE — Telephone Encounter (Signed)
Attempted to confirm remote transmission with pt. No answer and was unable to leave a message.   

## 2017-08-18 ENCOUNTER — Encounter: Payer: Self-pay | Admitting: Cardiology

## 2017-08-18 DIAGNOSIS — I480 Paroxysmal atrial fibrillation: Secondary | ICD-10-CM | POA: Diagnosis not present

## 2017-08-18 LAB — LIPID PANEL W/REFLEX DIRECT LDL
CHOL/HDL RATIO: 3.3 ratio (ref <5.0)
CHOLESTEROL: 127 mg/dL (ref <200)
HDL: 38 mg/dL — AB (ref >50)
LDL-CHOLESTEROL: 64 mg/dL
NON-HDL CHOLESTEROL (CALC): 89 mg/dL (ref <130)
TRIGLYCERIDES: 169 mg/dL — AB (ref <150)

## 2017-08-18 LAB — COMPLETE METABOLIC PANEL WITH GFR
ALT: 10 U/L (ref 6–29)
AST: 17 U/L (ref 10–35)
Albumin: 3.7 g/dL (ref 3.6–5.1)
Alkaline Phosphatase: 69 U/L (ref 33–130)
BUN: 15 mg/dL (ref 7–25)
CALCIUM: 9.1 mg/dL (ref 8.6–10.4)
CHLORIDE: 107 mmol/L (ref 98–110)
CO2: 25 mmol/L (ref 20–32)
CREATININE: 1.27 mg/dL — AB (ref 0.60–0.88)
GFR, EST AFRICAN AMERICAN: 44 mL/min — AB (ref >=60)
GFR, EST NON AFRICAN AMERICAN: 38 mL/min — AB (ref >=60)
Glucose, Bld: 177 mg/dL — ABNORMAL HIGH (ref 65–99)
POTASSIUM: 4.4 mmol/L (ref 3.5–5.3)
Sodium: 142 mmol/L (ref 135–146)
Total Bilirubin: 0.6 mg/dL (ref 0.2–1.2)
Total Protein: 5.9 g/dL — ABNORMAL LOW (ref 6.1–8.1)

## 2017-08-19 ENCOUNTER — Telehealth: Payer: Self-pay | Admitting: *Deleted

## 2017-08-19 LAB — TSH: TSH: 3.13 mIU/L

## 2017-08-19 NOTE — Telephone Encounter (Signed)
error 

## 2017-08-22 ENCOUNTER — Other Ambulatory Visit: Payer: Self-pay | Admitting: *Deleted

## 2017-08-22 DIAGNOSIS — E1165 Type 2 diabetes mellitus with hyperglycemia: Principal | ICD-10-CM

## 2017-08-22 DIAGNOSIS — Z794 Long term (current) use of insulin: Principal | ICD-10-CM

## 2017-08-22 DIAGNOSIS — IMO0001 Reserved for inherently not codable concepts without codable children: Secondary | ICD-10-CM

## 2017-08-22 MED ORDER — "INSULIN SYRINGE-NEEDLE U-100 30G X 5/16"" 0.5 ML MISC": 11 refills | Status: DC

## 2017-08-25 ENCOUNTER — Telehealth: Payer: Self-pay | Admitting: Pharmacist Clinician (PhC)/ Clinical Pharmacy Specialist

## 2017-08-25 NOTE — Telephone Encounter (Signed)
Coumadin letter 

## 2017-09-02 DIAGNOSIS — M5137 Other intervertebral disc degeneration, lumbosacral region: Secondary | ICD-10-CM | POA: Diagnosis not present

## 2017-09-02 DIAGNOSIS — I999 Unspecified disorder of circulatory system: Secondary | ICD-10-CM | POA: Diagnosis not present

## 2017-09-02 DIAGNOSIS — S72491D Other fracture of lower end of right femur, subsequent encounter for closed fracture with routine healing: Secondary | ICD-10-CM | POA: Diagnosis not present

## 2017-09-02 DIAGNOSIS — M47818 Spondylosis without myelopathy or radiculopathy, sacral and sacrococcygeal region: Secondary | ICD-10-CM | POA: Diagnosis not present

## 2017-09-02 DIAGNOSIS — Z9889 Other specified postprocedural states: Secondary | ICD-10-CM | POA: Diagnosis not present

## 2017-09-02 DIAGNOSIS — M8580 Other specified disorders of bone density and structure, unspecified site: Secondary | ICD-10-CM | POA: Diagnosis not present

## 2017-09-02 DIAGNOSIS — Z4789 Encounter for other orthopedic aftercare: Secondary | ICD-10-CM | POA: Diagnosis not present

## 2017-09-02 DIAGNOSIS — M86651 Other chronic osteomyelitis, right thigh: Secondary | ICD-10-CM | POA: Diagnosis not present

## 2017-09-02 DIAGNOSIS — M1611 Unilateral primary osteoarthritis, right hip: Secondary | ICD-10-CM | POA: Diagnosis not present

## 2017-09-02 DIAGNOSIS — M1711 Unilateral primary osteoarthritis, right knee: Secondary | ICD-10-CM | POA: Diagnosis not present

## 2017-09-12 ENCOUNTER — Telehealth: Payer: Self-pay | Admitting: Cardiovascular Disease

## 2017-09-12 NOTE — Telephone Encounter (Signed)
Left message on secure detailed voicemail--- echo 01/30/17  EF 55-60%

## 2017-09-12 NOTE — Telephone Encounter (Signed)
Have the pt had a recent Echo since 2014? If so,what was her last EF?

## 2017-09-12 NOTE — Telephone Encounter (Signed)
ERROR ON PREVIOUS NOTE PATIENT HAD ECHO 01/30/2013, NOT 01/30/2017

## 2017-09-14 ENCOUNTER — Ambulatory Visit: Payer: Medicare Other | Admitting: Family Medicine

## 2017-09-16 ENCOUNTER — Other Ambulatory Visit: Payer: Self-pay | Admitting: Family Medicine

## 2017-09-21 ENCOUNTER — Ambulatory Visit (INDEPENDENT_AMBULATORY_CARE_PROVIDER_SITE_OTHER): Payer: Medicare Other | Admitting: Family Medicine

## 2017-09-21 ENCOUNTER — Encounter: Payer: Self-pay | Admitting: Family Medicine

## 2017-09-21 VITALS — BP 142/64 | HR 82

## 2017-09-21 DIAGNOSIS — G8929 Other chronic pain: Secondary | ICD-10-CM | POA: Diagnosis not present

## 2017-09-21 DIAGNOSIS — M545 Low back pain, unspecified: Secondary | ICD-10-CM

## 2017-09-21 DIAGNOSIS — L539 Erythematous condition, unspecified: Secondary | ICD-10-CM | POA: Diagnosis not present

## 2017-09-21 DIAGNOSIS — R29898 Other symptoms and signs involving the musculoskeletal system: Secondary | ICD-10-CM

## 2017-09-21 DIAGNOSIS — M79651 Pain in right thigh: Secondary | ICD-10-CM

## 2017-09-21 DIAGNOSIS — R5381 Other malaise: Secondary | ICD-10-CM | POA: Diagnosis not present

## 2017-09-21 DIAGNOSIS — I480 Paroxysmal atrial fibrillation: Secondary | ICD-10-CM | POA: Diagnosis not present

## 2017-09-21 DIAGNOSIS — R2681 Unsteadiness on feet: Secondary | ICD-10-CM | POA: Diagnosis not present

## 2017-09-21 LAB — PROTIME-INR: INR: 2.2 — AB (ref 0.9–1.1)

## 2017-09-21 MED ORDER — HYDROCODONE-ACETAMINOPHEN 5-325 MG PO TABS: 1.0000 | ORAL_TABLET | Freq: Four times a day (QID) | ORAL | 0 refills | Status: AC | PRN

## 2017-09-21 NOTE — Progress Notes (Signed)
Subjective:    Patient ID: [name], female    DOB: 1928/06/23, 83 y.o.   MRN: [id number]  HPI Diabetes - no hypoglycemic events. No wounds or sores that are not healing well. No increased thirst or urination. Checking glucose at home. Taking medications as prescribed without any side effects.I saw her about 2 months ago we decided to go ahead and add mealtime insulin. We started with 5 units before her evening meal. She had just filled a months worth of Lantus so held off on switching her to Novolin 70/30.Since then she's probably only given the short-acting insulin about 4 times. She says she just keeps forgetting to do it. Her daughter is here with her and she says even she forgets as well. She did bring in her home blood sugar log. Morning sugars over all look great but her evening sugars typically run in the upper 200s and 300s.  She has also had redness down the lateral side of her thighwhere she had surgery for about the last 3 weeks.  It can get red and hot and swollen and the skin will peel at times. She has chronic osteomyelitis of her right femur and is on penicillin 3 times a day. In fact she just saw the orthopedic surgeon on September 7 right before they went on vacation because it was looking right at that time just to make sure that everything was okay before they left. That she did have a couple days with the redness actually got a little better and she thought that it was actually improving but then it came right back. Has had some swelling around the right knee but that is not necessarily new. She's been having more pain in that right leg and in her posterior hip low back area. She still having significant difficulty with transitions and needs help getting up from a chair. Her daughter is wondering if she would benefit from having home health come in again and oozing some physical therapy. That was helpful per previously.  Follow-up chronic pain management-she is asking for  refill on her hydrocodone since her back has been bothering her more recently. It was last filled in May. Pain contract up-to-date.  Review of Systems  BP (!) 142/64   Pulse 82   SpO2 97%     Allergies  Allergen Reactions  . Ace Inhibitors     REACTION: cough  . Angiotensin Receptor Blockers Other (See Comments)    Renal dx.  Nephrology has recommended against use bc of hyperkalemia  . Fosinopril Sodium     REACTION: Cough  . Glipizide Other (See Comments)    Hypoglycemia  . Metformin And Related Other (See Comments)    Renal function  . Sulfamethoxazole-Trimethoprim     REACTION: Unspecified    Past Medical History:  Diagnosis Date  . Anemia    Pernicious  . Atrial fibrillation ([place]) 1/11    Dr Gwenlyn Found  . Back pain   . Chronic anticoagulation    on coumadin  . Chronic kidney disease    chronic, stage III  . Complete heart block ([place]) 01/31/2015  . Cystitis   . Diabetes mellitus    w/neuro manifestations, type II  . Diarrhea   . Esophageal stricture   . GERD (gastroesophageal reflux disease)   . H/O cardiac catheterization 2010   normal coronary arteries, EF 60%  . Heart failure    chronic diatolic- 2 D echo 1/74 EF 60%  . Heart murmur   .  Hiatal hernia   . History of kidney stones   . Hx of echocardiogram 04/04/13   EF 55-60%, mild MR  . Hyperlipidemia   . Hypertension   . IBS (irritable bowel syndrome)   . Memory loss   . Microalbuminuria   . Non-stress test nonreactive 11/14   Lexiscan myoview negative for ischemia  . Osteoarthrosis involving multiple sites   . PAF (paroxysmal atrial fibrillation) ([place])    Last DCCV 12/29/2009  . Pancreatitis   . Peptic ulcer    unspec. w/o obstruction  . RBBB   . Senile osteoporosis   . Swelling    limb, left more than right ankle   . Symptomatic bradycardia 01/2010   CHB, temp pacer and then Medtronic PPM  . Thyroid disease   . Tubular adenoma of colon   . Ulcer    Gastric    Past Surgical History:  Procedure  Laterality Date  . APPENDECTOMY    . CARDIAC CATHETERIZATION  2010   normal coronary arteries  . carotid dopplers- no significant stenosis  2007  . CATARACT EXTRACTION     both eyes  . EGD- esophageal stricture     gastric ulcer  . pacemaker placement  2/11   medtronic dual chamber  . TONSILLECTOMY AND ADENOIDECTOMY      Social History   Social History  . Marital status: Widowed    Spouse name: N/A  . Number of children: N/A  . Years of education: N/A   Occupational History  . Not on file.   Social History Main Topics  . Smoking status: Never Smoker  . Smokeless tobacco: Never Used  . Alcohol use No  . Drug use: No  . Sexual activity: No   Other Topics Concern  . Not on file   Social History Narrative  . No narrative on file    Family History  Problem Relation Age of Onset  . Diabetes Mother   . Prostate cancer Father   . Heart attack Father   . Colon cancer Sister   . Esophageal cancer Neg Hx   . Rectal cancer Neg Hx   . Stomach cancer Neg Hx   . Alzheimer's disease Sister   . Heart disease Brother     Outpatient Encounter Prescriptions as of 09/21/2017  Medication Sig  . ACCU-CHEK COMPACT PLUS test strip USE TO CHECK BLOOD SUGAR TWICE DAILY  . ascorbic acid (VITAMIN C) 500 MG tablet Take 500 mg by mouth 3 (three) times daily with meals.  Marland Kitchen b complex vitamins capsule Take 1 capsule by mouth daily.  . bifidobacterium infantis (ALIGN) capsule 1 capsule by mouth daily  . calcium citrate (CALCITRATE - DOSED IN MG ELEMENTAL CALCIUM) 950 MG tablet Take 950 mg by mouth daily.  . clotrimazole (LOTRIMIN) 1 % cream Apply 1 application topically 2 (two) times daily. Apply to affected areas twice daily  . gabapentin (NEURONTIN) 600 MG tablet Take 600 mg by mouth at bedtime.  Marland Kitchen HYDROcodone-acetaminophen (NORCO/VICODIN) 5-325 MG tablet Take 1 tablet by mouth every 6 (six) hours as needed. Take with food  . insulin regular (HUMULIN R) 100 units/mL injection 5 units SQ  10 minutes before evening meal.  . Insulin Syringe-Needle U-100 (EASY COMFORT INSULIN SYRINGE) 30G X 5/16" 0.5 ML MISC Use to inject insulin. Dx:E  . JANUVIA 100 MG tablet Take 1 tablet by mouth daily.  Marland Kitchen LANTUS SOLOSTAR 100 UNIT/ML Solostar Pen INJECT 28 UNITS INTO THE SKIN 2 (TWO) TIMES DAILY.  Marland Kitchen  levothyroxine (SYNTHROID, LEVOTHROID) 100 MCG tablet TAKE ONE TABLET BY MOUTH DAILY  . losartan (COZAAR) 25 MG tablet Take 25 mg by mouth daily.  . memantine (NAMENDA) 5 MG tablet TAKE 1 TABLET BY MOUTH AT BEDTIME  . metoprolol tartrate (LOPRESSOR) 25 MG tablet TAKE 1 TABLET (25 MG TOTAL) BY MOUTH 2 (TWO) TIMES DAILY.  . Multiple Vitamins-Minerals (CERTAVITE SENIOR/ANTIOXIDANT) TABS Take 1 tablet by mouth daily.  . pantoprazole (PROTONIX) 40 MG tablet TAKE 1 TABLET (40 MG TOTAL) BY MOUTH DAILY.  Marland Kitchen penicillin v potassium (VEETID) 250 MG tablet Take 250 mg by mouth 3 (three) times daily.  . pravastatin (PRAVACHOL) 40 MG tablet TAKE ONE TABLET BY MOUTH NIGHTLY AT BEDTIME  . zinc oxide 20 % ointment Apply 1 application topically 2 (two) times daily as needed for irritation (to external rectum and buttocks).  . [DISCONTINUED] HYDROcodone-acetaminophen (NORCO/VICODIN) 5-325 MG tablet Take 1 tablet by mouth every 6 (six) hours as needed. Take with food   No facility-administered encounter medications on file as of 09/21/2017.          Objective:   Physical Exam  Constitutional: She is oriented to person, place, and time. She appears well-developed and well-nourished.  HENT:  Head: Normocephalic and atraumatic.  Cardiovascular: Normal rate, regular rhythm and normal heart sounds.   Pulmonary/Chest: Effort normal and breath sounds normal.  Musculoskeletal:  She has an indented postop surgical wound going down the lateral thigh to her knee. There is some erythema edema and blanching of the tissues. No active sign of abscess or induration. She does have some significant edema around the right knee as well  but no redness in the knee.  Neurological: She is alert and oriented to person, place, and time.  Skin: Skin is warm and dry.  Psychiatric: She has a normal mood and affect. Her behavior is normal.          Assessment & Plan:  Diabetes-uncontrolled.Discussed the importance of trying to get the short-acting insulin in with her evening meal. Even if she is Artie started eating she can stop and give her insulin that's okay. We discussed strategies around maybe setting a timer etc. to see if she can be at least a little bit more consistently and also marked her glucose log on the days that she is able to give it to see if it's making it impact or not. She eats very little for breakfast and for lunch, so I want to go up on her long-acting insulin as she occasionally will have a hypoglycemic number in the morning. Asked her daughter to call me in a couple weeks to see if they've been more successful in giving the short-acting insulin. We also may need to consider finding something that we can switch to a pen version which would be easier for her.  Erythema of postoperative wound. She is Artie on prophylaxis daily so recommend that we check a CBC sedimentation rate and CRP just to make sure that levels are elevated  Gait instability-we'll place new referral for home health. Patient prefers to an TV up.  Chronic low back pain-did refill her hydrocodone today. Tried to check the [facility] website but system is down.

## 2017-09-21 NOTE — Addendum Note (Signed)
Addended by: Teddy Spike on: 09/21/2017 05:13 PM   Modules accepted: Orders

## 2017-09-22 LAB — CBC WITH DIFFERENTIAL/PLATELET
Basophils Absolute: 170 cells/uL (ref 0–200)
Basophils Relative: 1.6 %
EOS PCT: 2.4 %
Eosinophils Absolute: 254 cells/uL (ref 15–500)
HCT: 38.3 % (ref 35.0–45.0)
HEMOGLOBIN: 12.7 g/dL (ref 11.7–15.5)
Lymphs Abs: 1367 cells/uL (ref 850–3900)
MCH: 31.4 pg (ref 27.0–33.0)
MCHC: 33.2 g/dL (ref 32.0–36.0)
MCV: 94.6 fL (ref 80.0–100.0)
MPV: 10 fL (ref 7.5–12.5)
Monocytes Relative: 6.6 %
NEUTROS PCT: 76.5 %
Neutro Abs: 8109 cells/uL — ABNORMAL HIGH (ref 1500–7800)
Platelets: 311 10*3/uL (ref 140–400)
RBC: 4.05 10*6/uL (ref 3.80–5.10)
RDW: 12.6 % (ref 11.0–15.0)
Total Lymphocyte: 12.9 %
WBC: 10.6 10*3/uL (ref 3.8–10.8)
WBCMIX: 700 {cells}/uL (ref 200–950)

## 2017-09-22 LAB — C-REACTIVE PROTEIN: CRP: 4.7 mg/L (ref <8.0)

## 2017-09-22 LAB — SEDIMENTATION RATE: SED RATE: 43 mm/h — AB (ref 0–30)

## 2017-09-23 ENCOUNTER — Ambulatory Visit (INDEPENDENT_AMBULATORY_CARE_PROVIDER_SITE_OTHER): Payer: Medicare Other | Admitting: Pharmacist

## 2017-09-23 DIAGNOSIS — Z7901 Long term (current) use of anticoagulants: Secondary | ICD-10-CM

## 2017-09-23 LAB — DRUG ABUSE PANEL 10-50 NO CONF, U
AMPHETAMINES (1000 ng/mL SCRN): NEGATIVE
BARBITURATES: NEGATIVE
BENZODIAZEPINES: NEGATIVE
COCAINE METABOLITES: NEGATIVE
MARIJUANA MET (50 NG/ML SCRN): NEGATIVE
METHADONE: NEGATIVE
METHAQUALONE: NEGATIVE
OPIATES: NEGATIVE
PHENCYCLIDINE: NEGATIVE
PROPOXYPHENE: NEGATIVE

## 2017-09-26 ENCOUNTER — Telehealth: Payer: Self-pay | Admitting: *Deleted

## 2017-09-26 DIAGNOSIS — E039 Hypothyroidism, unspecified: Secondary | ICD-10-CM | POA: Diagnosis not present

## 2017-09-26 DIAGNOSIS — I480 Paroxysmal atrial fibrillation: Secondary | ICD-10-CM | POA: Diagnosis not present

## 2017-09-26 DIAGNOSIS — I129 Hypertensive chronic kidney disease with stage 1 through stage 4 chronic kidney disease, or unspecified chronic kidney disease: Secondary | ICD-10-CM | POA: Diagnosis not present

## 2017-09-26 DIAGNOSIS — N183 Chronic kidney disease, stage 3 (moderate): Secondary | ICD-10-CM | POA: Diagnosis not present

## 2017-09-26 DIAGNOSIS — Z8673 Personal history of transient ischemic attack (TIA), and cerebral infarction without residual deficits: Secondary | ICD-10-CM | POA: Diagnosis not present

## 2017-09-26 DIAGNOSIS — I5032 Chronic diastolic (congestive) heart failure: Secondary | ICD-10-CM | POA: Diagnosis not present

## 2017-09-26 DIAGNOSIS — I130 Hypertensive heart and chronic kidney disease with heart failure and stage 1 through stage 4 chronic kidney disease, or unspecified chronic kidney disease: Secondary | ICD-10-CM | POA: Diagnosis not present

## 2017-09-26 DIAGNOSIS — Z950 Presence of cardiac pacemaker: Secondary | ICD-10-CM | POA: Diagnosis not present

## 2017-09-26 DIAGNOSIS — T847XXA Infection and inflammatory reaction due to other internal orthopedic prosthetic devices, implants and grafts, initial encounter: Secondary | ICD-10-CM | POA: Diagnosis not present

## 2017-09-26 DIAGNOSIS — M810 Age-related osteoporosis without current pathological fracture: Secondary | ICD-10-CM | POA: Diagnosis not present

## 2017-09-26 DIAGNOSIS — E1122 Type 2 diabetes mellitus with diabetic chronic kidney disease: Secondary | ICD-10-CM | POA: Diagnosis not present

## 2017-09-26 DIAGNOSIS — Z7901 Long term (current) use of anticoagulants: Secondary | ICD-10-CM | POA: Diagnosis not present

## 2017-09-26 DIAGNOSIS — I5030 Unspecified diastolic (congestive) heart failure: Secondary | ICD-10-CM | POA: Diagnosis not present

## 2017-09-26 DIAGNOSIS — E785 Hyperlipidemia, unspecified: Secondary | ICD-10-CM | POA: Diagnosis not present

## 2017-09-26 DIAGNOSIS — Z472 Encounter for removal of internal fixation device: Secondary | ICD-10-CM | POA: Diagnosis not present

## 2017-09-26 DIAGNOSIS — I071 Rheumatic tricuspid insufficiency: Secondary | ICD-10-CM | POA: Diagnosis not present

## 2017-09-26 DIAGNOSIS — Z794 Long term (current) use of insulin: Secondary | ICD-10-CM | POA: Diagnosis not present

## 2017-09-26 DIAGNOSIS — T847XXD Infection and inflammatory reaction due to other internal orthopedic prosthetic devices, implants and grafts, subsequent encounter: Secondary | ICD-10-CM | POA: Diagnosis not present

## 2017-09-26 DIAGNOSIS — I442 Atrioventricular block, complete: Secondary | ICD-10-CM | POA: Diagnosis not present

## 2017-09-26 DIAGNOSIS — Z01810 Encounter for preprocedural cardiovascular examination: Secondary | ICD-10-CM | POA: Diagnosis not present

## 2017-09-26 DIAGNOSIS — E1159 Type 2 diabetes mellitus with other circulatory complications: Secondary | ICD-10-CM | POA: Diagnosis not present

## 2017-09-26 DIAGNOSIS — T84620A Infection and inflammatory reaction due to internal fixation device of right femur, initial encounter: Secondary | ICD-10-CM | POA: Diagnosis not present

## 2017-09-26 DIAGNOSIS — I2510 Atherosclerotic heart disease of native coronary artery without angina pectoris: Secondary | ICD-10-CM | POA: Diagnosis not present

## 2017-09-26 NOTE — Telephone Encounter (Signed)
I think the best course is to get back in with her orth for some possible imaging or treatment.  I could always check labs again to see if her numbers are going up.

## 2017-09-26 NOTE — Telephone Encounter (Signed)
Spoke w/pt's daughter and she stated that her wound has gotten redder and gotten painful over the weekend. She's c/o it being painful to touch. Will fwd pcp for advice. [name], [name]

## 2017-09-26 NOTE — Telephone Encounter (Signed)
Called and informed pt's daughter of recommendations. She stated that she was just seen by the ortho on 09/02/17 and they didn't think that anything needed to be done at that time. She wondered if she should call the ID doctor to see if they should change her ABX back to what she was taking before. She said that she is now on PCN since other antibiotic caused her to have diarrhea.   I spoke w/Dr. Madilyn Fireman and she advised that she should contact either one of her specialist due to the change in her status. She voiced understanding and stated that she would see who she could get her in with.[name] [place]

## 2017-10-03 ENCOUNTER — Telehealth: Payer: Self-pay

## 2017-10-03 ENCOUNTER — Ambulatory Visit (INDEPENDENT_AMBULATORY_CARE_PROVIDER_SITE_OTHER): Payer: Medicare Other | Admitting: Pharmacist Clinician (PhC)/ Clinical Pharmacy Specialist

## 2017-10-03 DIAGNOSIS — I480 Paroxysmal atrial fibrillation: Secondary | ICD-10-CM | POA: Diagnosis not present

## 2017-10-03 DIAGNOSIS — Z9181 History of falling: Secondary | ICD-10-CM | POA: Diagnosis not present

## 2017-10-03 DIAGNOSIS — I130 Hypertensive heart and chronic kidney disease with heart failure and stage 1 through stage 4 chronic kidney disease, or unspecified chronic kidney disease: Secondary | ICD-10-CM | POA: Diagnosis not present

## 2017-10-03 DIAGNOSIS — N183 Chronic kidney disease, stage 3 (moderate): Secondary | ICD-10-CM | POA: Diagnosis not present

## 2017-10-03 DIAGNOSIS — Z4801 Encounter for change or removal of surgical wound dressing: Secondary | ICD-10-CM | POA: Diagnosis not present

## 2017-10-03 DIAGNOSIS — I5032 Chronic diastolic (congestive) heart failure: Secondary | ICD-10-CM | POA: Diagnosis not present

## 2017-10-03 DIAGNOSIS — M810 Age-related osteoporosis without current pathological fracture: Secondary | ICD-10-CM | POA: Diagnosis not present

## 2017-10-03 DIAGNOSIS — E1122 Type 2 diabetes mellitus with diabetic chronic kidney disease: Secondary | ICD-10-CM | POA: Diagnosis not present

## 2017-10-03 DIAGNOSIS — Z7901 Long term (current) use of anticoagulants: Secondary | ICD-10-CM

## 2017-10-03 DIAGNOSIS — E1140 Type 2 diabetes mellitus with diabetic neuropathy, unspecified: Secondary | ICD-10-CM | POA: Diagnosis not present

## 2017-10-03 DIAGNOSIS — T847XXD Infection and inflammatory reaction due to other internal orthopedic prosthetic devices, implants and grafts, subsequent encounter: Secondary | ICD-10-CM | POA: Diagnosis not present

## 2017-10-03 DIAGNOSIS — Z950 Presence of cardiac pacemaker: Secondary | ICD-10-CM | POA: Diagnosis not present

## 2017-10-03 LAB — POCT INR: INR: 2

## 2017-10-03 NOTE — Telephone Encounter (Signed)
[name], [facility], is seeing patient twice a week for hardware complications.  The ortho MD said that it was no longer needed for Home Health due to ortho complications, but she has A-Fib and is on coumadin.  She is with the [facility] coumadin clinic for her INR.  She is asking for an order for disease management and nursing care for BID for 9 weeks.  Is it ok to give the order.  She can be reached at [phone].

## 2017-10-03 NOTE — Telephone Encounter (Signed)
Yes ok for order

## 2017-10-04 DIAGNOSIS — Z9181 History of falling: Secondary | ICD-10-CM | POA: Diagnosis not present

## 2017-10-04 DIAGNOSIS — T847XXD Infection and inflammatory reaction due to other internal orthopedic prosthetic devices, implants and grafts, subsequent encounter: Secondary | ICD-10-CM | POA: Diagnosis not present

## 2017-10-04 DIAGNOSIS — N183 Chronic kidney disease, stage 3 (moderate): Secondary | ICD-10-CM | POA: Diagnosis not present

## 2017-10-04 DIAGNOSIS — I5032 Chronic diastolic (congestive) heart failure: Secondary | ICD-10-CM | POA: Diagnosis not present

## 2017-10-04 DIAGNOSIS — I480 Paroxysmal atrial fibrillation: Secondary | ICD-10-CM | POA: Diagnosis not present

## 2017-10-04 DIAGNOSIS — Z4801 Encounter for change or removal of surgical wound dressing: Secondary | ICD-10-CM | POA: Diagnosis not present

## 2017-10-04 DIAGNOSIS — E1140 Type 2 diabetes mellitus with diabetic neuropathy, unspecified: Secondary | ICD-10-CM | POA: Diagnosis not present

## 2017-10-04 DIAGNOSIS — I130 Hypertensive heart and chronic kidney disease with heart failure and stage 1 through stage 4 chronic kidney disease, or unspecified chronic kidney disease: Secondary | ICD-10-CM | POA: Diagnosis not present

## 2017-10-04 DIAGNOSIS — E1122 Type 2 diabetes mellitus with diabetic chronic kidney disease: Secondary | ICD-10-CM | POA: Diagnosis not present

## 2017-10-04 DIAGNOSIS — Z7901 Long term (current) use of anticoagulants: Secondary | ICD-10-CM | POA: Diagnosis not present

## 2017-10-04 DIAGNOSIS — M810 Age-related osteoporosis without current pathological fracture: Secondary | ICD-10-CM | POA: Diagnosis not present

## 2017-10-04 DIAGNOSIS — Z950 Presence of cardiac pacemaker: Secondary | ICD-10-CM | POA: Diagnosis not present

## 2017-10-04 NOTE — Telephone Encounter (Signed)
Laura advised.

## 2017-10-07 DIAGNOSIS — T847XXS Infection and inflammatory reaction due to other internal orthopedic prosthetic devices, implants and grafts, sequela: Secondary | ICD-10-CM | POA: Diagnosis not present

## 2017-10-07 DIAGNOSIS — B952 Enterococcus as the cause of diseases classified elsewhere: Secondary | ICD-10-CM | POA: Diagnosis not present

## 2017-10-07 DIAGNOSIS — T84620D Infection and inflammatory reaction due to internal fixation device of right femur, subsequent encounter: Secondary | ICD-10-CM | POA: Diagnosis not present

## 2017-10-10 ENCOUNTER — Inpatient Hospital Stay: Payer: Medicare Other | Admitting: Family Medicine

## 2017-10-10 DIAGNOSIS — I5032 Chronic diastolic (congestive) heart failure: Secondary | ICD-10-CM | POA: Diagnosis not present

## 2017-10-10 DIAGNOSIS — I480 Paroxysmal atrial fibrillation: Secondary | ICD-10-CM | POA: Diagnosis not present

## 2017-10-10 DIAGNOSIS — Z950 Presence of cardiac pacemaker: Secondary | ICD-10-CM | POA: Diagnosis not present

## 2017-10-10 DIAGNOSIS — N183 Chronic kidney disease, stage 3 (moderate): Secondary | ICD-10-CM | POA: Diagnosis not present

## 2017-10-10 DIAGNOSIS — Z4801 Encounter for change or removal of surgical wound dressing: Secondary | ICD-10-CM | POA: Diagnosis not present

## 2017-10-10 DIAGNOSIS — E1122 Type 2 diabetes mellitus with diabetic chronic kidney disease: Secondary | ICD-10-CM | POA: Diagnosis not present

## 2017-10-10 DIAGNOSIS — Z9181 History of falling: Secondary | ICD-10-CM | POA: Diagnosis not present

## 2017-10-10 DIAGNOSIS — Z7901 Long term (current) use of anticoagulants: Secondary | ICD-10-CM | POA: Diagnosis not present

## 2017-10-10 DIAGNOSIS — M810 Age-related osteoporosis without current pathological fracture: Secondary | ICD-10-CM | POA: Diagnosis not present

## 2017-10-10 DIAGNOSIS — E1140 Type 2 diabetes mellitus with diabetic neuropathy, unspecified: Secondary | ICD-10-CM | POA: Diagnosis not present

## 2017-10-10 DIAGNOSIS — I130 Hypertensive heart and chronic kidney disease with heart failure and stage 1 through stage 4 chronic kidney disease, or unspecified chronic kidney disease: Secondary | ICD-10-CM | POA: Diagnosis not present

## 2017-10-10 DIAGNOSIS — T847XXD Infection and inflammatory reaction due to other internal orthopedic prosthetic devices, implants and grafts, subsequent encounter: Secondary | ICD-10-CM | POA: Diagnosis not present

## 2017-10-11 DIAGNOSIS — M810 Age-related osteoporosis without current pathological fracture: Secondary | ICD-10-CM | POA: Diagnosis not present

## 2017-10-11 DIAGNOSIS — I130 Hypertensive heart and chronic kidney disease with heart failure and stage 1 through stage 4 chronic kidney disease, or unspecified chronic kidney disease: Secondary | ICD-10-CM | POA: Diagnosis not present

## 2017-10-11 DIAGNOSIS — T847XXD Infection and inflammatory reaction due to other internal orthopedic prosthetic devices, implants and grafts, subsequent encounter: Secondary | ICD-10-CM | POA: Diagnosis not present

## 2017-10-11 DIAGNOSIS — Z7901 Long term (current) use of anticoagulants: Secondary | ICD-10-CM | POA: Diagnosis not present

## 2017-10-11 DIAGNOSIS — N183 Chronic kidney disease, stage 3 (moderate): Secondary | ICD-10-CM | POA: Diagnosis not present

## 2017-10-11 DIAGNOSIS — Z4801 Encounter for change or removal of surgical wound dressing: Secondary | ICD-10-CM | POA: Diagnosis not present

## 2017-10-11 DIAGNOSIS — Z950 Presence of cardiac pacemaker: Secondary | ICD-10-CM | POA: Diagnosis not present

## 2017-10-11 DIAGNOSIS — E1122 Type 2 diabetes mellitus with diabetic chronic kidney disease: Secondary | ICD-10-CM | POA: Diagnosis not present

## 2017-10-11 DIAGNOSIS — E1140 Type 2 diabetes mellitus with diabetic neuropathy, unspecified: Secondary | ICD-10-CM | POA: Diagnosis not present

## 2017-10-11 DIAGNOSIS — I480 Paroxysmal atrial fibrillation: Secondary | ICD-10-CM | POA: Diagnosis not present

## 2017-10-11 DIAGNOSIS — I5032 Chronic diastolic (congestive) heart failure: Secondary | ICD-10-CM | POA: Diagnosis not present

## 2017-10-11 DIAGNOSIS — Z9181 History of falling: Secondary | ICD-10-CM | POA: Diagnosis not present

## 2017-10-12 ENCOUNTER — Telehealth: Payer: Self-pay | Admitting: Family Medicine

## 2017-10-12 DIAGNOSIS — Z950 Presence of cardiac pacemaker: Secondary | ICD-10-CM | POA: Diagnosis not present

## 2017-10-12 DIAGNOSIS — Z9181 History of falling: Secondary | ICD-10-CM | POA: Diagnosis not present

## 2017-10-12 DIAGNOSIS — I130 Hypertensive heart and chronic kidney disease with heart failure and stage 1 through stage 4 chronic kidney disease, or unspecified chronic kidney disease: Secondary | ICD-10-CM | POA: Diagnosis not present

## 2017-10-12 DIAGNOSIS — Z7901 Long term (current) use of anticoagulants: Secondary | ICD-10-CM | POA: Diagnosis not present

## 2017-10-12 DIAGNOSIS — N183 Chronic kidney disease, stage 3 (moderate): Secondary | ICD-10-CM | POA: Diagnosis not present

## 2017-10-12 DIAGNOSIS — I5032 Chronic diastolic (congestive) heart failure: Secondary | ICD-10-CM | POA: Diagnosis not present

## 2017-10-12 DIAGNOSIS — I480 Paroxysmal atrial fibrillation: Secondary | ICD-10-CM | POA: Diagnosis not present

## 2017-10-12 DIAGNOSIS — Z4801 Encounter for change or removal of surgical wound dressing: Secondary | ICD-10-CM | POA: Diagnosis not present

## 2017-10-12 DIAGNOSIS — E1122 Type 2 diabetes mellitus with diabetic chronic kidney disease: Secondary | ICD-10-CM | POA: Diagnosis not present

## 2017-10-12 DIAGNOSIS — T847XXD Infection and inflammatory reaction due to other internal orthopedic prosthetic devices, implants and grafts, subsequent encounter: Secondary | ICD-10-CM | POA: Diagnosis not present

## 2017-10-12 DIAGNOSIS — E1140 Type 2 diabetes mellitus with diabetic neuropathy, unspecified: Secondary | ICD-10-CM | POA: Diagnosis not present

## 2017-10-12 DIAGNOSIS — M810 Age-related osteoporosis without current pathological fracture: Secondary | ICD-10-CM | POA: Diagnosis not present

## 2017-10-12 NOTE — Telephone Encounter (Signed)
[name] informed, samples placed up front.[name], [name]

## 2017-10-12 NOTE — Telephone Encounter (Signed)
Do we have any samples left that you are aware of?

## 2017-10-12 NOTE — Telephone Encounter (Signed)
Received call from [name] stating the Pt can not continue getting the JANUVIA 100 MG tablet. It is very expensive and would like a cheaper option. Will route to PCP for review. Advised PCP is out of office this afternoon and it would be checked tomorrow, verbalized understanding.

## 2017-10-12 NOTE — Telephone Encounter (Signed)
We have 2 boxes that we could give her I believe. We can check with [name]. That will get her through for a few weeks. Has she applied to see if she qualifies for patient assistance. There really is not another good substitute that's going to be any cheaper.

## 2017-10-13 ENCOUNTER — Telehealth: Payer: Self-pay | Admitting: *Deleted

## 2017-10-13 DIAGNOSIS — Z4802 Encounter for removal of sutures: Secondary | ICD-10-CM | POA: Diagnosis not present

## 2017-10-13 NOTE — Telephone Encounter (Signed)
[facility] called stating that pt was on Gabapentin 600 mg TID and when she was d/c she was cut to QD.  Her previous prescription was for 600 mg 1 tablet in the morning 2 tablets at dinner and 3 in the evening. (04/03/15) I told her that I would fwd this to Dr. Madilyn Fireman for f/u and advice. Her current med list has it listed at 600 mg QD.[name] [place]

## 2017-10-13 NOTE — Telephone Encounter (Signed)
She cannot take that dose of the medication. She can take one a day 3 times a day but cannot take 2 and 3 tabs at one time of the 600mg  tab.

## 2017-10-13 NOTE — Telephone Encounter (Signed)
Spoke w/pt's daughter and asked her how she had been taking the medication she stated that she was taking 1 tab in the AM and 3 tabs QHS. I told her that Dr. Madilyn Fireman stated that she cannot take that much medication. Max dose is 2400 on this. I informed her that she can take 1 tab in the AM 1 at lunch and 2 at bedtime. She is going to call with who wrote for this last and what is actually on the bottle. And will change the way she is taking this to help with her pain.Elouise Munroe'

## 2017-10-14 DIAGNOSIS — N183 Chronic kidney disease, stage 3 (moderate): Secondary | ICD-10-CM | POA: Diagnosis not present

## 2017-10-14 DIAGNOSIS — E1140 Type 2 diabetes mellitus with diabetic neuropathy, unspecified: Secondary | ICD-10-CM | POA: Diagnosis not present

## 2017-10-14 DIAGNOSIS — Z950 Presence of cardiac pacemaker: Secondary | ICD-10-CM | POA: Diagnosis not present

## 2017-10-14 DIAGNOSIS — I480 Paroxysmal atrial fibrillation: Secondary | ICD-10-CM | POA: Diagnosis not present

## 2017-10-14 DIAGNOSIS — E1122 Type 2 diabetes mellitus with diabetic chronic kidney disease: Secondary | ICD-10-CM | POA: Diagnosis not present

## 2017-10-14 DIAGNOSIS — Z9181 History of falling: Secondary | ICD-10-CM | POA: Diagnosis not present

## 2017-10-14 DIAGNOSIS — T847XXD Infection and inflammatory reaction due to other internal orthopedic prosthetic devices, implants and grafts, subsequent encounter: Secondary | ICD-10-CM | POA: Diagnosis not present

## 2017-10-14 DIAGNOSIS — Z4801 Encounter for change or removal of surgical wound dressing: Secondary | ICD-10-CM | POA: Diagnosis not present

## 2017-10-14 DIAGNOSIS — I5032 Chronic diastolic (congestive) heart failure: Secondary | ICD-10-CM | POA: Diagnosis not present

## 2017-10-14 DIAGNOSIS — Z7901 Long term (current) use of anticoagulants: Secondary | ICD-10-CM | POA: Diagnosis not present

## 2017-10-14 DIAGNOSIS — I130 Hypertensive heart and chronic kidney disease with heart failure and stage 1 through stage 4 chronic kidney disease, or unspecified chronic kidney disease: Secondary | ICD-10-CM | POA: Diagnosis not present

## 2017-10-14 DIAGNOSIS — M810 Age-related osteoporosis without current pathological fracture: Secondary | ICD-10-CM | POA: Diagnosis not present

## 2017-10-15 ENCOUNTER — Other Ambulatory Visit: Payer: Self-pay | Admitting: Family Medicine

## 2017-10-17 DIAGNOSIS — I5032 Chronic diastolic (congestive) heart failure: Secondary | ICD-10-CM | POA: Diagnosis not present

## 2017-10-17 DIAGNOSIS — M810 Age-related osteoporosis without current pathological fracture: Secondary | ICD-10-CM | POA: Diagnosis not present

## 2017-10-17 DIAGNOSIS — Z9181 History of falling: Secondary | ICD-10-CM | POA: Diagnosis not present

## 2017-10-17 DIAGNOSIS — Z950 Presence of cardiac pacemaker: Secondary | ICD-10-CM | POA: Diagnosis not present

## 2017-10-17 DIAGNOSIS — N183 Chronic kidney disease, stage 3 (moderate): Secondary | ICD-10-CM | POA: Diagnosis not present

## 2017-10-17 DIAGNOSIS — I130 Hypertensive heart and chronic kidney disease with heart failure and stage 1 through stage 4 chronic kidney disease, or unspecified chronic kidney disease: Secondary | ICD-10-CM | POA: Diagnosis not present

## 2017-10-17 DIAGNOSIS — E1122 Type 2 diabetes mellitus with diabetic chronic kidney disease: Secondary | ICD-10-CM | POA: Diagnosis not present

## 2017-10-17 DIAGNOSIS — I480 Paroxysmal atrial fibrillation: Secondary | ICD-10-CM | POA: Diagnosis not present

## 2017-10-17 DIAGNOSIS — E1140 Type 2 diabetes mellitus with diabetic neuropathy, unspecified: Secondary | ICD-10-CM | POA: Diagnosis not present

## 2017-10-17 DIAGNOSIS — Z7901 Long term (current) use of anticoagulants: Secondary | ICD-10-CM | POA: Diagnosis not present

## 2017-10-17 DIAGNOSIS — Z4801 Encounter for change or removal of surgical wound dressing: Secondary | ICD-10-CM | POA: Diagnosis not present

## 2017-10-17 DIAGNOSIS — T847XXD Infection and inflammatory reaction due to other internal orthopedic prosthetic devices, implants and grafts, subsequent encounter: Secondary | ICD-10-CM | POA: Diagnosis not present

## 2017-10-17 LAB — POCT INR: INR: 2.7

## 2017-10-18 ENCOUNTER — Ambulatory Visit (INDEPENDENT_AMBULATORY_CARE_PROVIDER_SITE_OTHER): Payer: Medicare Other | Admitting: Pharmacist Clinician (PhC)/ Clinical Pharmacy Specialist

## 2017-10-18 DIAGNOSIS — Z7901 Long term (current) use of anticoagulants: Secondary | ICD-10-CM | POA: Diagnosis not present

## 2017-10-18 DIAGNOSIS — I480 Paroxysmal atrial fibrillation: Secondary | ICD-10-CM

## 2017-10-19 DIAGNOSIS — Z4801 Encounter for change or removal of surgical wound dressing: Secondary | ICD-10-CM | POA: Diagnosis not present

## 2017-10-19 DIAGNOSIS — Z9181 History of falling: Secondary | ICD-10-CM | POA: Diagnosis not present

## 2017-10-19 DIAGNOSIS — Z950 Presence of cardiac pacemaker: Secondary | ICD-10-CM | POA: Diagnosis not present

## 2017-10-19 DIAGNOSIS — E1122 Type 2 diabetes mellitus with diabetic chronic kidney disease: Secondary | ICD-10-CM | POA: Diagnosis not present

## 2017-10-19 DIAGNOSIS — M810 Age-related osteoporosis without current pathological fracture: Secondary | ICD-10-CM | POA: Diagnosis not present

## 2017-10-19 DIAGNOSIS — I130 Hypertensive heart and chronic kidney disease with heart failure and stage 1 through stage 4 chronic kidney disease, or unspecified chronic kidney disease: Secondary | ICD-10-CM | POA: Diagnosis not present

## 2017-10-19 DIAGNOSIS — T847XXD Infection and inflammatory reaction due to other internal orthopedic prosthetic devices, implants and grafts, subsequent encounter: Secondary | ICD-10-CM | POA: Diagnosis not present

## 2017-10-19 DIAGNOSIS — E1140 Type 2 diabetes mellitus with diabetic neuropathy, unspecified: Secondary | ICD-10-CM | POA: Diagnosis not present

## 2017-10-19 DIAGNOSIS — I480 Paroxysmal atrial fibrillation: Secondary | ICD-10-CM | POA: Diagnosis not present

## 2017-10-19 DIAGNOSIS — Z7901 Long term (current) use of anticoagulants: Secondary | ICD-10-CM | POA: Diagnosis not present

## 2017-10-19 DIAGNOSIS — I5032 Chronic diastolic (congestive) heart failure: Secondary | ICD-10-CM | POA: Diagnosis not present

## 2017-10-19 DIAGNOSIS — N183 Chronic kidney disease, stage 3 (moderate): Secondary | ICD-10-CM | POA: Diagnosis not present

## 2017-10-20 DIAGNOSIS — Z4801 Encounter for change or removal of surgical wound dressing: Secondary | ICD-10-CM | POA: Diagnosis not present

## 2017-10-20 DIAGNOSIS — M810 Age-related osteoporosis without current pathological fracture: Secondary | ICD-10-CM | POA: Diagnosis not present

## 2017-10-20 DIAGNOSIS — I480 Paroxysmal atrial fibrillation: Secondary | ICD-10-CM | POA: Diagnosis not present

## 2017-10-20 DIAGNOSIS — Z9181 History of falling: Secondary | ICD-10-CM | POA: Diagnosis not present

## 2017-10-20 DIAGNOSIS — Z7901 Long term (current) use of anticoagulants: Secondary | ICD-10-CM | POA: Diagnosis not present

## 2017-10-20 DIAGNOSIS — I5032 Chronic diastolic (congestive) heart failure: Secondary | ICD-10-CM | POA: Diagnosis not present

## 2017-10-20 DIAGNOSIS — Z950 Presence of cardiac pacemaker: Secondary | ICD-10-CM | POA: Diagnosis not present

## 2017-10-20 DIAGNOSIS — N183 Chronic kidney disease, stage 3 (moderate): Secondary | ICD-10-CM | POA: Diagnosis not present

## 2017-10-20 DIAGNOSIS — E1122 Type 2 diabetes mellitus with diabetic chronic kidney disease: Secondary | ICD-10-CM | POA: Diagnosis not present

## 2017-10-20 DIAGNOSIS — E1140 Type 2 diabetes mellitus with diabetic neuropathy, unspecified: Secondary | ICD-10-CM | POA: Diagnosis not present

## 2017-10-20 DIAGNOSIS — I130 Hypertensive heart and chronic kidney disease with heart failure and stage 1 through stage 4 chronic kidney disease, or unspecified chronic kidney disease: Secondary | ICD-10-CM | POA: Diagnosis not present

## 2017-10-20 DIAGNOSIS — T847XXD Infection and inflammatory reaction due to other internal orthopedic prosthetic devices, implants and grafts, subsequent encounter: Secondary | ICD-10-CM | POA: Diagnosis not present

## 2017-10-25 DIAGNOSIS — Z950 Presence of cardiac pacemaker: Secondary | ICD-10-CM | POA: Diagnosis not present

## 2017-10-25 DIAGNOSIS — I480 Paroxysmal atrial fibrillation: Secondary | ICD-10-CM | POA: Diagnosis not present

## 2017-10-25 DIAGNOSIS — N183 Chronic kidney disease, stage 3 (moderate): Secondary | ICD-10-CM | POA: Diagnosis not present

## 2017-10-25 DIAGNOSIS — I130 Hypertensive heart and chronic kidney disease with heart failure and stage 1 through stage 4 chronic kidney disease, or unspecified chronic kidney disease: Secondary | ICD-10-CM | POA: Diagnosis not present

## 2017-10-25 DIAGNOSIS — Z7901 Long term (current) use of anticoagulants: Secondary | ICD-10-CM | POA: Diagnosis not present

## 2017-10-25 DIAGNOSIS — Z4801 Encounter for change or removal of surgical wound dressing: Secondary | ICD-10-CM | POA: Diagnosis not present

## 2017-10-25 DIAGNOSIS — I5032 Chronic diastolic (congestive) heart failure: Secondary | ICD-10-CM | POA: Diagnosis not present

## 2017-10-25 DIAGNOSIS — Z9181 History of falling: Secondary | ICD-10-CM | POA: Diagnosis not present

## 2017-10-25 DIAGNOSIS — E1140 Type 2 diabetes mellitus with diabetic neuropathy, unspecified: Secondary | ICD-10-CM | POA: Diagnosis not present

## 2017-10-25 DIAGNOSIS — T847XXD Infection and inflammatory reaction due to other internal orthopedic prosthetic devices, implants and grafts, subsequent encounter: Secondary | ICD-10-CM | POA: Diagnosis not present

## 2017-10-25 DIAGNOSIS — E1122 Type 2 diabetes mellitus with diabetic chronic kidney disease: Secondary | ICD-10-CM | POA: Diagnosis not present

## 2017-10-25 DIAGNOSIS — M810 Age-related osteoporosis without current pathological fracture: Secondary | ICD-10-CM | POA: Diagnosis not present

## 2017-10-26 DIAGNOSIS — E1140 Type 2 diabetes mellitus with diabetic neuropathy, unspecified: Secondary | ICD-10-CM | POA: Diagnosis not present

## 2017-10-26 DIAGNOSIS — M810 Age-related osteoporosis without current pathological fracture: Secondary | ICD-10-CM | POA: Diagnosis not present

## 2017-10-26 DIAGNOSIS — I5032 Chronic diastolic (congestive) heart failure: Secondary | ICD-10-CM | POA: Diagnosis not present

## 2017-10-26 DIAGNOSIS — Z950 Presence of cardiac pacemaker: Secondary | ICD-10-CM | POA: Diagnosis not present

## 2017-10-26 DIAGNOSIS — N183 Chronic kidney disease, stage 3 (moderate): Secondary | ICD-10-CM | POA: Diagnosis not present

## 2017-10-26 DIAGNOSIS — T847XXD Infection and inflammatory reaction due to other internal orthopedic prosthetic devices, implants and grafts, subsequent encounter: Secondary | ICD-10-CM | POA: Diagnosis not present

## 2017-10-26 DIAGNOSIS — I480 Paroxysmal atrial fibrillation: Secondary | ICD-10-CM | POA: Diagnosis not present

## 2017-10-26 DIAGNOSIS — Z4801 Encounter for change or removal of surgical wound dressing: Secondary | ICD-10-CM | POA: Diagnosis not present

## 2017-10-26 DIAGNOSIS — Z7901 Long term (current) use of anticoagulants: Secondary | ICD-10-CM | POA: Diagnosis not present

## 2017-10-26 DIAGNOSIS — I130 Hypertensive heart and chronic kidney disease with heart failure and stage 1 through stage 4 chronic kidney disease, or unspecified chronic kidney disease: Secondary | ICD-10-CM | POA: Diagnosis not present

## 2017-10-26 DIAGNOSIS — E1122 Type 2 diabetes mellitus with diabetic chronic kidney disease: Secondary | ICD-10-CM | POA: Diagnosis not present

## 2017-10-26 DIAGNOSIS — Z9181 History of falling: Secondary | ICD-10-CM | POA: Diagnosis not present

## 2017-10-28 DIAGNOSIS — I130 Hypertensive heart and chronic kidney disease with heart failure and stage 1 through stage 4 chronic kidney disease, or unspecified chronic kidney disease: Secondary | ICD-10-CM | POA: Diagnosis not present

## 2017-10-28 DIAGNOSIS — N183 Chronic kidney disease, stage 3 (moderate): Secondary | ICD-10-CM | POA: Diagnosis not present

## 2017-10-28 DIAGNOSIS — Z7901 Long term (current) use of anticoagulants: Secondary | ICD-10-CM | POA: Diagnosis not present

## 2017-10-28 DIAGNOSIS — T847XXD Infection and inflammatory reaction due to other internal orthopedic prosthetic devices, implants and grafts, subsequent encounter: Secondary | ICD-10-CM | POA: Diagnosis not present

## 2017-10-28 DIAGNOSIS — E1140 Type 2 diabetes mellitus with diabetic neuropathy, unspecified: Secondary | ICD-10-CM | POA: Diagnosis not present

## 2017-10-28 DIAGNOSIS — I480 Paroxysmal atrial fibrillation: Secondary | ICD-10-CM | POA: Diagnosis not present

## 2017-10-28 DIAGNOSIS — E1122 Type 2 diabetes mellitus with diabetic chronic kidney disease: Secondary | ICD-10-CM | POA: Diagnosis not present

## 2017-10-28 DIAGNOSIS — I5032 Chronic diastolic (congestive) heart failure: Secondary | ICD-10-CM | POA: Diagnosis not present

## 2017-10-28 DIAGNOSIS — M810 Age-related osteoporosis without current pathological fracture: Secondary | ICD-10-CM | POA: Diagnosis not present

## 2017-10-28 DIAGNOSIS — Z9181 History of falling: Secondary | ICD-10-CM | POA: Diagnosis not present

## 2017-10-28 DIAGNOSIS — Z4801 Encounter for change or removal of surgical wound dressing: Secondary | ICD-10-CM | POA: Diagnosis not present

## 2017-10-28 DIAGNOSIS — Z950 Presence of cardiac pacemaker: Secondary | ICD-10-CM | POA: Diagnosis not present

## 2017-10-31 DIAGNOSIS — S82401A Unspecified fracture of shaft of right fibula, initial encounter for closed fracture: Secondary | ICD-10-CM | POA: Diagnosis not present

## 2017-10-31 DIAGNOSIS — S82101A Unspecified fracture of upper end of right tibia, initial encounter for closed fracture: Secondary | ICD-10-CM | POA: Diagnosis not present

## 2017-10-31 DIAGNOSIS — I5032 Chronic diastolic (congestive) heart failure: Secondary | ICD-10-CM | POA: Diagnosis not present

## 2017-10-31 DIAGNOSIS — K219 Gastro-esophageal reflux disease without esophagitis: Secondary | ICD-10-CM | POA: Diagnosis not present

## 2017-10-31 DIAGNOSIS — D62 Acute posthemorrhagic anemia: Secondary | ICD-10-CM | POA: Diagnosis not present

## 2017-10-31 DIAGNOSIS — Z950 Presence of cardiac pacemaker: Secondary | ICD-10-CM | POA: Diagnosis not present

## 2017-10-31 DIAGNOSIS — I129 Hypertensive chronic kidney disease with stage 1 through stage 4 chronic kidney disease, or unspecified chronic kidney disease: Secondary | ICD-10-CM | POA: Diagnosis not present

## 2017-10-31 DIAGNOSIS — M6281 Muscle weakness (generalized): Secondary | ICD-10-CM | POA: Diagnosis not present

## 2017-10-31 DIAGNOSIS — S82191A Other fracture of upper end of right tibia, initial encounter for closed fracture: Secondary | ICD-10-CM | POA: Diagnosis not present

## 2017-10-31 DIAGNOSIS — W19XXXA Unspecified fall, initial encounter: Secondary | ICD-10-CM | POA: Diagnosis not present

## 2017-10-31 DIAGNOSIS — W1839XA Other fall on same level, initial encounter: Secondary | ICD-10-CM | POA: Diagnosis not present

## 2017-10-31 DIAGNOSIS — Z7409 Other reduced mobility: Secondary | ICD-10-CM | POA: Diagnosis not present

## 2017-10-31 DIAGNOSIS — S82231A Displaced oblique fracture of shaft of right tibia, initial encounter for closed fracture: Secondary | ICD-10-CM | POA: Diagnosis not present

## 2017-10-31 DIAGNOSIS — E1165 Type 2 diabetes mellitus with hyperglycemia: Secondary | ICD-10-CM | POA: Diagnosis not present

## 2017-10-31 DIAGNOSIS — M25571 Pain in right ankle and joints of right foot: Secondary | ICD-10-CM | POA: Diagnosis not present

## 2017-10-31 DIAGNOSIS — S82491A Other fracture of shaft of right fibula, initial encounter for closed fracture: Secondary | ICD-10-CM | POA: Diagnosis not present

## 2017-10-31 DIAGNOSIS — M810 Age-related osteoporosis without current pathological fracture: Secondary | ICD-10-CM | POA: Diagnosis not present

## 2017-10-31 DIAGNOSIS — S82021A Displaced longitudinal fracture of right patella, initial encounter for closed fracture: Secondary | ICD-10-CM | POA: Diagnosis not present

## 2017-10-31 DIAGNOSIS — E039 Hypothyroidism, unspecified: Secondary | ICD-10-CM | POA: Diagnosis not present

## 2017-10-31 DIAGNOSIS — R2689 Other abnormalities of gait and mobility: Secondary | ICD-10-CM | POA: Diagnosis not present

## 2017-10-31 DIAGNOSIS — T148XXA Other injury of unspecified body region, initial encounter: Secondary | ICD-10-CM | POA: Diagnosis not present

## 2017-10-31 DIAGNOSIS — S82001A Unspecified fracture of right patella, initial encounter for closed fracture: Secondary | ICD-10-CM | POA: Diagnosis not present

## 2017-10-31 DIAGNOSIS — S8291XA Unspecified fracture of right lower leg, initial encounter for closed fracture: Secondary | ICD-10-CM | POA: Diagnosis not present

## 2017-10-31 DIAGNOSIS — S82831A Other fracture of upper and lower end of right fibula, initial encounter for closed fracture: Secondary | ICD-10-CM | POA: Diagnosis not present

## 2017-10-31 DIAGNOSIS — S72301D Unspecified fracture of shaft of right femur, subsequent encounter for closed fracture with routine healing: Secondary | ICD-10-CM | POA: Diagnosis not present

## 2017-10-31 DIAGNOSIS — I130 Hypertensive heart and chronic kidney disease with heart failure and stage 1 through stage 4 chronic kidney disease, or unspecified chronic kidney disease: Secondary | ICD-10-CM | POA: Diagnosis not present

## 2017-10-31 DIAGNOSIS — M79606 Pain in leg, unspecified: Secondary | ICD-10-CM | POA: Diagnosis not present

## 2017-10-31 DIAGNOSIS — M25421 Effusion, right elbow: Secondary | ICD-10-CM | POA: Diagnosis not present

## 2017-10-31 DIAGNOSIS — Y998 Other external cause status: Secondary | ICD-10-CM | POA: Diagnosis not present

## 2017-10-31 DIAGNOSIS — N183 Chronic kidney disease, stage 3 (moderate): Secondary | ICD-10-CM | POA: Diagnosis not present

## 2017-10-31 DIAGNOSIS — S82221A Displaced transverse fracture of shaft of right tibia, initial encounter for closed fracture: Secondary | ICD-10-CM | POA: Diagnosis not present

## 2017-10-31 DIAGNOSIS — S82201D Unspecified fracture of shaft of right tibia, subsequent encounter for closed fracture with routine healing: Secondary | ICD-10-CM | POA: Diagnosis not present

## 2017-10-31 DIAGNOSIS — E1122 Type 2 diabetes mellitus with diabetic chronic kidney disease: Secondary | ICD-10-CM | POA: Diagnosis not present

## 2017-10-31 DIAGNOSIS — I4891 Unspecified atrial fibrillation: Secondary | ICD-10-CM | POA: Diagnosis not present

## 2017-10-31 DIAGNOSIS — Z7901 Long term (current) use of anticoagulants: Secondary | ICD-10-CM | POA: Diagnosis not present

## 2017-10-31 DIAGNOSIS — S82091A Other fracture of right patella, initial encounter for closed fracture: Secondary | ICD-10-CM | POA: Diagnosis not present

## 2017-10-31 DIAGNOSIS — M4800 Spinal stenosis, site unspecified: Secondary | ICD-10-CM | POA: Diagnosis not present

## 2017-10-31 DIAGNOSIS — E1140 Type 2 diabetes mellitus with diabetic neuropathy, unspecified: Secondary | ICD-10-CM | POA: Diagnosis not present

## 2017-10-31 DIAGNOSIS — S82201A Unspecified fracture of shaft of right tibia, initial encounter for closed fracture: Secondary | ICD-10-CM | POA: Diagnosis not present

## 2017-10-31 DIAGNOSIS — I480 Paroxysmal atrial fibrillation: Secondary | ICD-10-CM | POA: Diagnosis not present

## 2017-10-31 DIAGNOSIS — S82031A Displaced transverse fracture of right patella, initial encounter for closed fracture: Secondary | ICD-10-CM | POA: Diagnosis not present

## 2017-10-31 DIAGNOSIS — R102 Pelvic and perineal pain: Secondary | ICD-10-CM | POA: Diagnosis not present

## 2017-11-01 ENCOUNTER — Telehealth: Payer: Self-pay | Admitting: Family Medicine

## 2017-11-01 NOTE — Telephone Encounter (Signed)
FYI:  Ms. Mclees daughter, [name] called.  She cancelled appointment cancelled Ms. Azam's  appointment for tomorrow because she fell and broke three bones in the same leg(broke bones in this leg 2 yrs ago) and is in the hospital  Sherri's phone number is [phone].  Thank you.

## 2017-11-02 ENCOUNTER — Ambulatory Visit: Payer: Medicare Other | Admitting: Family Medicine

## 2017-11-04 NOTE — Telephone Encounter (Signed)
Called and spoke with her daughter [name].  She is still in the hospital but they are expecting discharge either tomorrow or Monday.  She will need to go to a skilled nursing home.  She has a fracture to the tibia fibula and the patella.  She is not doing very well right now.  She is very sedate not speaking much and not wanting to eat.  Her daughter is very worried that she may have given up.  She has been dealing with the right hip fracture and poor wound healing and hardware infection for the last 2 years.  Asked her to call us if she needs anything and please call us next week and let us know where she ends up going for skilled nursing.

## 2017-11-09 DIAGNOSIS — S82021D Displaced longitudinal fracture of right patella, subsequent encounter for closed fracture with routine healing: Secondary | ICD-10-CM | POA: Diagnosis not present

## 2017-11-09 DIAGNOSIS — I4891 Unspecified atrial fibrillation: Secondary | ICD-10-CM | POA: Diagnosis not present

## 2017-11-09 DIAGNOSIS — E119 Type 2 diabetes mellitus without complications: Secondary | ICD-10-CM | POA: Diagnosis not present

## 2017-11-09 DIAGNOSIS — Z79899 Other long term (current) drug therapy: Secondary | ICD-10-CM | POA: Diagnosis not present

## 2017-11-09 DIAGNOSIS — G629 Polyneuropathy, unspecified: Secondary | ICD-10-CM | POA: Diagnosis not present

## 2017-11-09 DIAGNOSIS — I5032 Chronic diastolic (congestive) heart failure: Secondary | ICD-10-CM | POA: Diagnosis not present

## 2017-11-09 DIAGNOSIS — S82101A Unspecified fracture of upper end of right tibia, initial encounter for closed fracture: Secondary | ICD-10-CM | POA: Diagnosis not present

## 2017-11-09 DIAGNOSIS — S8291XA Unspecified fracture of right lower leg, initial encounter for closed fracture: Secondary | ICD-10-CM | POA: Diagnosis not present

## 2017-11-09 DIAGNOSIS — Z7901 Long term (current) use of anticoagulants: Secondary | ICD-10-CM | POA: Diagnosis not present

## 2017-11-09 DIAGNOSIS — S82209A Unspecified fracture of shaft of unspecified tibia, initial encounter for closed fracture: Secondary | ICD-10-CM | POA: Diagnosis not present

## 2017-11-09 DIAGNOSIS — Z9181 History of falling: Secondary | ICD-10-CM | POA: Diagnosis not present

## 2017-11-09 DIAGNOSIS — S82009A Unspecified fracture of unspecified patella, initial encounter for closed fracture: Secondary | ICD-10-CM | POA: Diagnosis not present

## 2017-11-09 DIAGNOSIS — S82409A Unspecified fracture of shaft of unspecified fibula, initial encounter for closed fracture: Secondary | ICD-10-CM | POA: Diagnosis not present

## 2017-11-09 DIAGNOSIS — N183 Chronic kidney disease, stage 3 (moderate): Secondary | ICD-10-CM | POA: Diagnosis not present

## 2017-11-09 DIAGNOSIS — M79673 Pain in unspecified foot: Secondary | ICD-10-CM | POA: Diagnosis not present

## 2017-11-09 DIAGNOSIS — M6281 Muscle weakness (generalized): Secondary | ICD-10-CM | POA: Diagnosis not present

## 2017-11-09 DIAGNOSIS — S82101D Unspecified fracture of upper end of right tibia, subsequent encounter for closed fracture with routine healing: Secondary | ICD-10-CM | POA: Diagnosis not present

## 2017-11-09 DIAGNOSIS — Z7409 Other reduced mobility: Secondary | ICD-10-CM | POA: Diagnosis not present

## 2017-11-09 DIAGNOSIS — S82091A Other fracture of right patella, initial encounter for closed fracture: Secondary | ICD-10-CM | POA: Diagnosis not present

## 2017-11-09 DIAGNOSIS — S82491A Other fracture of shaft of right fibula, initial encounter for closed fracture: Secondary | ICD-10-CM | POA: Diagnosis not present

## 2017-11-09 DIAGNOSIS — R2689 Other abnormalities of gait and mobility: Secondary | ICD-10-CM | POA: Diagnosis not present

## 2017-11-09 DIAGNOSIS — D62 Acute posthemorrhagic anemia: Secondary | ICD-10-CM | POA: Diagnosis not present

## 2017-11-09 DIAGNOSIS — S82201D Unspecified fracture of shaft of right tibia, subsequent encounter for closed fracture with routine healing: Secondary | ICD-10-CM | POA: Diagnosis not present

## 2017-11-09 DIAGNOSIS — M8000XD Age-related osteoporosis with current pathological fracture, unspecified site, subsequent encounter for fracture with routine healing: Secondary | ICD-10-CM | POA: Diagnosis not present

## 2017-11-09 MED ORDER — WARFARIN SODIUM 2.5 MG PO TABS
2.50 mg | ORAL_TABLET | ORAL | Status: DC
Start: 2017-11-09 — End: 2017-11-09

## 2017-11-09 MED ORDER — INSULIN LISPRO 100 UNIT/ML ~~LOC~~ SOLN
2.00 | SUBCUTANEOUS | Status: DC
Start: 2017-11-09 — End: 2017-11-09

## 2017-11-09 MED ORDER — SENNOSIDES-DOCUSATE SODIUM 8.6-50 MG PO TABS
2.00 | ORAL_TABLET | ORAL | Status: DC
Start: 2017-11-09 — End: 2017-11-09

## 2017-11-09 MED ORDER — DEXTROSE 10 % IV SOLN
125.00 mL | INTRAVENOUS | Status: DC
Start: ? — End: 2017-11-09

## 2017-11-09 MED ORDER — PRAVASTATIN SODIUM 40 MG PO TABS
40.00 mg | ORAL_TABLET | ORAL | Status: DC
Start: 2017-11-10 — End: 2017-11-09

## 2017-11-09 MED ORDER — GABAPENTIN 600 MG PO TABS
600.00 mg | ORAL_TABLET | ORAL | Status: DC
Start: 2017-11-09 — End: 2017-11-09

## 2017-11-09 MED ORDER — PANTOPRAZOLE SODIUM 40 MG PO TBEC
40.00 mg | DELAYED_RELEASE_TABLET | ORAL | Status: DC
Start: 2017-11-10 — End: 2017-11-09

## 2017-11-09 MED ORDER — METOPROLOL TARTRATE 25 MG PO TABS
25.00 mg | ORAL_TABLET | ORAL | Status: DC
Start: 2017-11-09 — End: 2017-11-09

## 2017-11-09 MED ORDER — OXYCODONE HCL 5 MG PO TABS
2.50 mg | ORAL_TABLET | ORAL | Status: DC
Start: ? — End: 2017-11-09

## 2017-11-09 MED ORDER — POLYETHYLENE GLYCOL 3350 17 G PO PACK
17.00 g | PACK | ORAL | Status: DC
Start: 2017-11-10 — End: 2017-11-09

## 2017-11-09 MED ORDER — ONDANSETRON HCL 4 MG/2ML IJ SOLN
4.00 mg | INTRAMUSCULAR | Status: DC
Start: ? — End: 2017-11-09

## 2017-11-09 MED ORDER — TRAMADOL HCL 50 MG PO TABS
25.00 mg | ORAL_TABLET | ORAL | Status: DC
Start: ? — End: 2017-11-09

## 2017-11-09 MED ORDER — ACETAMINOPHEN 500 MG PO TABS
1000.00 mg | ORAL_TABLET | ORAL | Status: DC
Start: 2017-11-09 — End: 2017-11-09

## 2017-11-09 MED ORDER — MEMANTINE HCL 5 MG PO TABS
5.00 mg | ORAL_TABLET | ORAL | Status: DC
Start: 2017-11-09 — End: 2017-11-09

## 2017-11-09 MED ORDER — CLINDAMYCIN HCL 150 MG PO CAPS
300.00 mg | ORAL_CAPSULE | ORAL | Status: DC
Start: 2017-11-09 — End: 2017-11-09

## 2017-11-09 MED ORDER — GENERIC EXTERNAL MEDICATION
17.00 | Status: DC
Start: 2017-11-09 — End: 2017-11-09

## 2017-11-09 MED ORDER — LEVOTHYROXINE SODIUM 100 MCG PO TABS
100.00 | ORAL_TABLET | ORAL | Status: DC
Start: 2017-11-10 — End: 2017-11-09

## 2017-11-10 DIAGNOSIS — Z9181 History of falling: Secondary | ICD-10-CM | POA: Diagnosis not present

## 2017-11-10 DIAGNOSIS — M79673 Pain in unspecified foot: Secondary | ICD-10-CM | POA: Diagnosis not present

## 2017-11-11 DIAGNOSIS — S82009A Unspecified fracture of unspecified patella, initial encounter for closed fracture: Secondary | ICD-10-CM | POA: Diagnosis not present

## 2017-11-11 DIAGNOSIS — S82209A Unspecified fracture of shaft of unspecified tibia, initial encounter for closed fracture: Secondary | ICD-10-CM | POA: Diagnosis not present

## 2017-11-11 DIAGNOSIS — S82409A Unspecified fracture of shaft of unspecified fibula, initial encounter for closed fracture: Secondary | ICD-10-CM | POA: Diagnosis not present

## 2017-11-11 DIAGNOSIS — D62 Acute posthemorrhagic anemia: Secondary | ICD-10-CM | POA: Diagnosis not present

## 2017-11-14 DIAGNOSIS — Z9181 History of falling: Secondary | ICD-10-CM | POA: Diagnosis not present

## 2017-11-14 DIAGNOSIS — M79673 Pain in unspecified foot: Secondary | ICD-10-CM | POA: Diagnosis not present

## 2017-11-15 DIAGNOSIS — G629 Polyneuropathy, unspecified: Secondary | ICD-10-CM | POA: Diagnosis not present

## 2017-11-15 DIAGNOSIS — S82209A Unspecified fracture of shaft of unspecified tibia, initial encounter for closed fracture: Secondary | ICD-10-CM | POA: Diagnosis not present

## 2017-11-15 DIAGNOSIS — S82409A Unspecified fracture of shaft of unspecified fibula, initial encounter for closed fracture: Secondary | ICD-10-CM | POA: Diagnosis not present

## 2017-11-15 DIAGNOSIS — E119 Type 2 diabetes mellitus without complications: Secondary | ICD-10-CM | POA: Diagnosis not present

## 2017-11-16 DIAGNOSIS — Z9181 History of falling: Secondary | ICD-10-CM | POA: Diagnosis not present

## 2017-11-16 DIAGNOSIS — M79673 Pain in unspecified foot: Secondary | ICD-10-CM | POA: Diagnosis not present

## 2017-11-21 DIAGNOSIS — I4891 Unspecified atrial fibrillation: Secondary | ICD-10-CM | POA: Diagnosis not present

## 2017-11-21 DIAGNOSIS — G629 Polyneuropathy, unspecified: Secondary | ICD-10-CM | POA: Diagnosis not present

## 2017-11-21 DIAGNOSIS — S82209A Unspecified fracture of shaft of unspecified tibia, initial encounter for closed fracture: Secondary | ICD-10-CM | POA: Diagnosis not present

## 2017-11-21 DIAGNOSIS — D62 Acute posthemorrhagic anemia: Secondary | ICD-10-CM | POA: Diagnosis not present

## 2017-11-22 ENCOUNTER — Inpatient Hospital Stay: Payer: Medicare Other | Admitting: Family Medicine

## 2017-11-22 DIAGNOSIS — M79673 Pain in unspecified foot: Secondary | ICD-10-CM | POA: Diagnosis not present

## 2017-11-22 DIAGNOSIS — Z9181 History of falling: Secondary | ICD-10-CM | POA: Diagnosis not present

## 2017-11-22 NOTE — Progress Notes (Deleted)
   Subjective:    Patient ID: [name], female    DOB: [birth year], 83 y.o.   MRN: [id number]  HPI    Review of Systems     Objective:   Physical Exam        Assessment & Plan:

## 2017-11-23 ENCOUNTER — Telehealth: Payer: Self-pay

## 2017-11-23 DIAGNOSIS — S82021D Displaced longitudinal fracture of right patella, subsequent encounter for closed fracture with routine healing: Secondary | ICD-10-CM | POA: Diagnosis not present

## 2017-11-23 DIAGNOSIS — M8000XD Age-related osteoporosis with current pathological fracture, unspecified site, subsequent encounter for fracture with routine healing: Secondary | ICD-10-CM | POA: Diagnosis not present

## 2017-11-23 DIAGNOSIS — S82101D Unspecified fracture of upper end of right tibia, subsequent encounter for closed fracture with routine healing: Secondary | ICD-10-CM | POA: Diagnosis not present

## 2017-11-23 NOTE — Telephone Encounter (Signed)
[name] called. She stated her mom is now at [facility].

## 2017-11-28 DIAGNOSIS — S82409A Unspecified fracture of shaft of unspecified fibula, initial encounter for closed fracture: Secondary | ICD-10-CM | POA: Diagnosis not present

## 2017-11-28 DIAGNOSIS — G629 Polyneuropathy, unspecified: Secondary | ICD-10-CM | POA: Diagnosis not present

## 2017-11-28 DIAGNOSIS — D62 Acute posthemorrhagic anemia: Secondary | ICD-10-CM | POA: Diagnosis not present

## 2017-11-28 DIAGNOSIS — S82209A Unspecified fracture of shaft of unspecified tibia, initial encounter for closed fracture: Secondary | ICD-10-CM | POA: Diagnosis not present

## 2017-11-30 DIAGNOSIS — I480 Paroxysmal atrial fibrillation: Secondary | ICD-10-CM | POA: Diagnosis not present

## 2017-11-30 DIAGNOSIS — N183 Chronic kidney disease, stage 3 (moderate): Secondary | ICD-10-CM | POA: Diagnosis not present

## 2017-11-30 DIAGNOSIS — M810 Age-related osteoporosis without current pathological fracture: Secondary | ICD-10-CM | POA: Diagnosis not present

## 2017-11-30 DIAGNOSIS — Z7901 Long term (current) use of anticoagulants: Secondary | ICD-10-CM | POA: Diagnosis not present

## 2017-11-30 DIAGNOSIS — E1140 Type 2 diabetes mellitus with diabetic neuropathy, unspecified: Secondary | ICD-10-CM | POA: Diagnosis not present

## 2017-11-30 DIAGNOSIS — Z4801 Encounter for change or removal of surgical wound dressing: Secondary | ICD-10-CM | POA: Diagnosis not present

## 2017-11-30 DIAGNOSIS — T847XXD Infection and inflammatory reaction due to other internal orthopedic prosthetic devices, implants and grafts, subsequent encounter: Secondary | ICD-10-CM | POA: Diagnosis not present

## 2017-11-30 DIAGNOSIS — I5032 Chronic diastolic (congestive) heart failure: Secondary | ICD-10-CM | POA: Diagnosis not present

## 2017-11-30 DIAGNOSIS — E1122 Type 2 diabetes mellitus with diabetic chronic kidney disease: Secondary | ICD-10-CM | POA: Diagnosis not present

## 2017-11-30 DIAGNOSIS — Z9181 History of falling: Secondary | ICD-10-CM | POA: Diagnosis not present

## 2017-11-30 DIAGNOSIS — I130 Hypertensive heart and chronic kidney disease with heart failure and stage 1 through stage 4 chronic kidney disease, or unspecified chronic kidney disease: Secondary | ICD-10-CM | POA: Diagnosis not present

## 2017-11-30 DIAGNOSIS — Z950 Presence of cardiac pacemaker: Secondary | ICD-10-CM | POA: Diagnosis not present

## 2017-11-30 NOTE — Telephone Encounter (Signed)
[name] is now home and needs Home Health Aid and skilled nursing. Is it ok to give verbal order to start evaluation?

## 2017-12-01 DIAGNOSIS — I480 Paroxysmal atrial fibrillation: Secondary | ICD-10-CM | POA: Diagnosis not present

## 2017-12-01 DIAGNOSIS — E1140 Type 2 diabetes mellitus with diabetic neuropathy, unspecified: Secondary | ICD-10-CM | POA: Diagnosis not present

## 2017-12-01 DIAGNOSIS — E1122 Type 2 diabetes mellitus with diabetic chronic kidney disease: Secondary | ICD-10-CM | POA: Diagnosis not present

## 2017-12-01 DIAGNOSIS — N183 Chronic kidney disease, stage 3 (moderate): Secondary | ICD-10-CM | POA: Diagnosis not present

## 2017-12-01 DIAGNOSIS — I5032 Chronic diastolic (congestive) heart failure: Secondary | ICD-10-CM | POA: Diagnosis not present

## 2017-12-01 DIAGNOSIS — I130 Hypertensive heart and chronic kidney disease with heart failure and stage 1 through stage 4 chronic kidney disease, or unspecified chronic kidney disease: Secondary | ICD-10-CM | POA: Diagnosis not present

## 2017-12-01 DIAGNOSIS — Z9181 History of falling: Secondary | ICD-10-CM | POA: Diagnosis not present

## 2017-12-01 DIAGNOSIS — T847XXD Infection and inflammatory reaction due to other internal orthopedic prosthetic devices, implants and grafts, subsequent encounter: Secondary | ICD-10-CM | POA: Diagnosis not present

## 2017-12-01 DIAGNOSIS — Z7901 Long term (current) use of anticoagulants: Secondary | ICD-10-CM | POA: Diagnosis not present

## 2017-12-01 DIAGNOSIS — M810 Age-related osteoporosis without current pathological fracture: Secondary | ICD-10-CM | POA: Diagnosis not present

## 2017-12-01 DIAGNOSIS — G8929 Other chronic pain: Secondary | ICD-10-CM | POA: Diagnosis not present

## 2017-12-01 DIAGNOSIS — Z950 Presence of cardiac pacemaker: Secondary | ICD-10-CM | POA: Diagnosis not present

## 2017-12-01 DIAGNOSIS — Z4801 Encounter for change or removal of surgical wound dressing: Secondary | ICD-10-CM | POA: Diagnosis not present

## 2017-12-01 NOTE — Telephone Encounter (Signed)
Verbal given 

## 2017-12-01 NOTE — Telephone Encounter (Signed)
Yes, ok for verbal

## 2017-12-06 ENCOUNTER — Ambulatory Visit: Payer: Medicare Other | Admitting: Family Medicine

## 2017-12-06 DIAGNOSIS — I130 Hypertensive heart and chronic kidney disease with heart failure and stage 1 through stage 4 chronic kidney disease, or unspecified chronic kidney disease: Secondary | ICD-10-CM | POA: Diagnosis not present

## 2017-12-06 DIAGNOSIS — S82221D Displaced transverse fracture of shaft of right tibia, subsequent encounter for closed fracture with routine healing: Secondary | ICD-10-CM | POA: Diagnosis not present

## 2017-12-06 DIAGNOSIS — M150 Primary generalized (osteo)arthritis: Secondary | ICD-10-CM | POA: Diagnosis not present

## 2017-12-06 DIAGNOSIS — S82421D Displaced transverse fracture of shaft of right fibula, subsequent encounter for closed fracture with routine healing: Secondary | ICD-10-CM | POA: Diagnosis not present

## 2017-12-06 DIAGNOSIS — I2510 Atherosclerotic heart disease of native coronary artery without angina pectoris: Secondary | ICD-10-CM | POA: Diagnosis not present

## 2017-12-06 DIAGNOSIS — E168 Other specified disorders of pancreatic internal secretion: Secondary | ICD-10-CM | POA: Diagnosis not present

## 2017-12-06 DIAGNOSIS — E1140 Type 2 diabetes mellitus with diabetic neuropathy, unspecified: Secondary | ICD-10-CM | POA: Diagnosis not present

## 2017-12-06 DIAGNOSIS — N183 Chronic kidney disease, stage 3 (moderate): Secondary | ICD-10-CM | POA: Diagnosis not present

## 2017-12-06 DIAGNOSIS — I5032 Chronic diastolic (congestive) heart failure: Secondary | ICD-10-CM | POA: Diagnosis not present

## 2017-12-06 DIAGNOSIS — E1122 Type 2 diabetes mellitus with diabetic chronic kidney disease: Secondary | ICD-10-CM | POA: Diagnosis not present

## 2017-12-06 DIAGNOSIS — I480 Paroxysmal atrial fibrillation: Secondary | ICD-10-CM | POA: Diagnosis not present

## 2017-12-06 DIAGNOSIS — S82101D Unspecified fracture of upper end of right tibia, subsequent encounter for closed fracture with routine healing: Secondary | ICD-10-CM | POA: Diagnosis not present

## 2017-12-06 DIAGNOSIS — M810 Age-related osteoporosis without current pathological fracture: Secondary | ICD-10-CM | POA: Diagnosis not present

## 2017-12-06 DIAGNOSIS — S82001D Unspecified fracture of right patella, subsequent encounter for closed fracture with routine healing: Secondary | ICD-10-CM | POA: Diagnosis not present

## 2017-12-07 ENCOUNTER — Other Ambulatory Visit: Payer: Self-pay | Admitting: *Deleted

## 2017-12-07 ENCOUNTER — Ambulatory Visit: Payer: Medicare Other | Admitting: Family Medicine

## 2017-12-07 ENCOUNTER — Ambulatory Visit (INDEPENDENT_AMBULATORY_CARE_PROVIDER_SITE_OTHER): Payer: Medicare Other | Admitting: Pharmacist Clinician (PhC)/ Clinical Pharmacy Specialist

## 2017-12-07 DIAGNOSIS — Z7901 Long term (current) use of anticoagulants: Secondary | ICD-10-CM

## 2017-12-07 DIAGNOSIS — I480 Paroxysmal atrial fibrillation: Secondary | ICD-10-CM | POA: Diagnosis not present

## 2017-12-07 LAB — POCT INR: INR: 3.4

## 2017-12-07 MED ORDER — INSULIN GLARGINE 100 UNIT/ML SOLOSTAR PEN: PEN_INJECTOR | SUBCUTANEOUS | 3 refills | Status: DC

## 2017-12-07 NOTE — Progress Notes (Deleted)
   Subjective:    Patient ID: [name], female    DOB: 10-01-1928, 83 y.o.   MRN: [id number]  HPI    Review of Systems     Objective:   Physical Exam        Assessment & Plan:

## 2017-12-08 DIAGNOSIS — M810 Age-related osteoporosis without current pathological fracture: Secondary | ICD-10-CM | POA: Diagnosis not present

## 2017-12-08 DIAGNOSIS — E1122 Type 2 diabetes mellitus with diabetic chronic kidney disease: Secondary | ICD-10-CM | POA: Diagnosis not present

## 2017-12-08 DIAGNOSIS — I5032 Chronic diastolic (congestive) heart failure: Secondary | ICD-10-CM | POA: Diagnosis not present

## 2017-12-08 DIAGNOSIS — I2510 Atherosclerotic heart disease of native coronary artery without angina pectoris: Secondary | ICD-10-CM | POA: Diagnosis not present

## 2017-12-08 DIAGNOSIS — M150 Primary generalized (osteo)arthritis: Secondary | ICD-10-CM | POA: Diagnosis not present

## 2017-12-08 DIAGNOSIS — N183 Chronic kidney disease, stage 3 (moderate): Secondary | ICD-10-CM | POA: Diagnosis not present

## 2017-12-08 DIAGNOSIS — I130 Hypertensive heart and chronic kidney disease with heart failure and stage 1 through stage 4 chronic kidney disease, or unspecified chronic kidney disease: Secondary | ICD-10-CM | POA: Diagnosis not present

## 2017-12-08 DIAGNOSIS — E1140 Type 2 diabetes mellitus with diabetic neuropathy, unspecified: Secondary | ICD-10-CM | POA: Diagnosis not present

## 2017-12-08 DIAGNOSIS — S82001D Unspecified fracture of right patella, subsequent encounter for closed fracture with routine healing: Secondary | ICD-10-CM | POA: Diagnosis not present

## 2017-12-08 DIAGNOSIS — S82221D Displaced transverse fracture of shaft of right tibia, subsequent encounter for closed fracture with routine healing: Secondary | ICD-10-CM | POA: Diagnosis not present

## 2017-12-08 DIAGNOSIS — S82101D Unspecified fracture of upper end of right tibia, subsequent encounter for closed fracture with routine healing: Secondary | ICD-10-CM | POA: Diagnosis not present

## 2017-12-08 DIAGNOSIS — I480 Paroxysmal atrial fibrillation: Secondary | ICD-10-CM | POA: Diagnosis not present

## 2017-12-08 DIAGNOSIS — S82421D Displaced transverse fracture of shaft of right fibula, subsequent encounter for closed fracture with routine healing: Secondary | ICD-10-CM | POA: Diagnosis not present

## 2017-12-09 DIAGNOSIS — M150 Primary generalized (osteo)arthritis: Secondary | ICD-10-CM | POA: Diagnosis not present

## 2017-12-09 DIAGNOSIS — S82221D Displaced transverse fracture of shaft of right tibia, subsequent encounter for closed fracture with routine healing: Secondary | ICD-10-CM | POA: Diagnosis not present

## 2017-12-09 DIAGNOSIS — I5032 Chronic diastolic (congestive) heart failure: Secondary | ICD-10-CM | POA: Diagnosis not present

## 2017-12-09 DIAGNOSIS — E1122 Type 2 diabetes mellitus with diabetic chronic kidney disease: Secondary | ICD-10-CM | POA: Diagnosis not present

## 2017-12-09 DIAGNOSIS — I2510 Atherosclerotic heart disease of native coronary artery without angina pectoris: Secondary | ICD-10-CM | POA: Diagnosis not present

## 2017-12-09 DIAGNOSIS — I480 Paroxysmal atrial fibrillation: Secondary | ICD-10-CM | POA: Diagnosis not present

## 2017-12-09 DIAGNOSIS — N183 Chronic kidney disease, stage 3 (moderate): Secondary | ICD-10-CM | POA: Diagnosis not present

## 2017-12-09 DIAGNOSIS — S82001D Unspecified fracture of right patella, subsequent encounter for closed fracture with routine healing: Secondary | ICD-10-CM | POA: Diagnosis not present

## 2017-12-09 DIAGNOSIS — I130 Hypertensive heart and chronic kidney disease with heart failure and stage 1 through stage 4 chronic kidney disease, or unspecified chronic kidney disease: Secondary | ICD-10-CM | POA: Diagnosis not present

## 2017-12-09 DIAGNOSIS — E1140 Type 2 diabetes mellitus with diabetic neuropathy, unspecified: Secondary | ICD-10-CM | POA: Diagnosis not present

## 2017-12-09 DIAGNOSIS — S82101D Unspecified fracture of upper end of right tibia, subsequent encounter for closed fracture with routine healing: Secondary | ICD-10-CM | POA: Diagnosis not present

## 2017-12-09 DIAGNOSIS — S82421D Displaced transverse fracture of shaft of right fibula, subsequent encounter for closed fracture with routine healing: Secondary | ICD-10-CM | POA: Diagnosis not present

## 2017-12-09 DIAGNOSIS — M810 Age-related osteoporosis without current pathological fracture: Secondary | ICD-10-CM | POA: Diagnosis not present

## 2017-12-13 ENCOUNTER — Ambulatory Visit (INDEPENDENT_AMBULATORY_CARE_PROVIDER_SITE_OTHER): Payer: Medicare Other | Admitting: Pharmacist

## 2017-12-13 DIAGNOSIS — Z7901 Long term (current) use of anticoagulants: Secondary | ICD-10-CM | POA: Diagnosis not present

## 2017-12-13 DIAGNOSIS — S82101D Unspecified fracture of upper end of right tibia, subsequent encounter for closed fracture with routine healing: Secondary | ICD-10-CM | POA: Diagnosis not present

## 2017-12-13 DIAGNOSIS — S82221D Displaced transverse fracture of shaft of right tibia, subsequent encounter for closed fracture with routine healing: Secondary | ICD-10-CM | POA: Diagnosis not present

## 2017-12-13 DIAGNOSIS — N183 Chronic kidney disease, stage 3 (moderate): Secondary | ICD-10-CM | POA: Diagnosis not present

## 2017-12-13 DIAGNOSIS — S82421D Displaced transverse fracture of shaft of right fibula, subsequent encounter for closed fracture with routine healing: Secondary | ICD-10-CM | POA: Diagnosis not present

## 2017-12-13 DIAGNOSIS — I130 Hypertensive heart and chronic kidney disease with heart failure and stage 1 through stage 4 chronic kidney disease, or unspecified chronic kidney disease: Secondary | ICD-10-CM | POA: Diagnosis not present

## 2017-12-13 DIAGNOSIS — I480 Paroxysmal atrial fibrillation: Secondary | ICD-10-CM | POA: Diagnosis not present

## 2017-12-13 DIAGNOSIS — I2510 Atherosclerotic heart disease of native coronary artery without angina pectoris: Secondary | ICD-10-CM | POA: Diagnosis not present

## 2017-12-13 DIAGNOSIS — M150 Primary generalized (osteo)arthritis: Secondary | ICD-10-CM | POA: Diagnosis not present

## 2017-12-13 DIAGNOSIS — I5032 Chronic diastolic (congestive) heart failure: Secondary | ICD-10-CM | POA: Diagnosis not present

## 2017-12-13 DIAGNOSIS — E1122 Type 2 diabetes mellitus with diabetic chronic kidney disease: Secondary | ICD-10-CM | POA: Diagnosis not present

## 2017-12-13 DIAGNOSIS — M810 Age-related osteoporosis without current pathological fracture: Secondary | ICD-10-CM | POA: Diagnosis not present

## 2017-12-13 DIAGNOSIS — E1140 Type 2 diabetes mellitus with diabetic neuropathy, unspecified: Secondary | ICD-10-CM | POA: Diagnosis not present

## 2017-12-13 DIAGNOSIS — S82001D Unspecified fracture of right patella, subsequent encounter for closed fracture with routine healing: Secondary | ICD-10-CM | POA: Diagnosis not present

## 2017-12-13 DIAGNOSIS — Z5181 Encounter for therapeutic drug level monitoring: Secondary | ICD-10-CM | POA: Diagnosis not present

## 2017-12-13 LAB — POCT INR: INR: 1.8

## 2017-12-14 ENCOUNTER — Ambulatory Visit (INDEPENDENT_AMBULATORY_CARE_PROVIDER_SITE_OTHER): Payer: Medicare Other | Admitting: Family Medicine

## 2017-12-14 ENCOUNTER — Telehealth: Payer: Self-pay | Admitting: Family Medicine

## 2017-12-14 ENCOUNTER — Encounter: Payer: Self-pay | Admitting: Family Medicine

## 2017-12-14 VITALS — BP 120/57 | HR 77 | Ht 67.0 in

## 2017-12-14 DIAGNOSIS — M810 Age-related osteoporosis without current pathological fracture: Secondary | ICD-10-CM | POA: Diagnosis not present

## 2017-12-14 DIAGNOSIS — E1142 Type 2 diabetes mellitus with diabetic polyneuropathy: Secondary | ICD-10-CM

## 2017-12-14 DIAGNOSIS — E7800 Pure hypercholesterolemia, unspecified: Secondary | ICD-10-CM | POA: Diagnosis not present

## 2017-12-14 DIAGNOSIS — E1143 Type 2 diabetes mellitus with diabetic autonomic (poly)neuropathy: Secondary | ICD-10-CM

## 2017-12-14 DIAGNOSIS — E039 Hypothyroidism, unspecified: Secondary | ICD-10-CM | POA: Diagnosis not present

## 2017-12-14 DIAGNOSIS — Z794 Long term (current) use of insulin: Secondary | ICD-10-CM | POA: Diagnosis not present

## 2017-12-14 MED ORDER — PIOGLITAZONE HCL 15 MG PO TABS: 15.0000 mg | ORAL_TABLET | Freq: Every day | ORAL | 0 refills | Status: DC

## 2017-12-14 MED ORDER — LOSARTAN POTASSIUM 25 MG PO TABS: 25.0000 mg | ORAL_TABLET | Freq: Every day | ORAL | 1 refills | Status: DC

## 2017-12-14 MED ORDER — SIMVASTATIN 40 MG PO TABS: ORAL_TABLET | ORAL | 1 refills | Status: DC

## 2017-12-14 NOTE — Progress Notes (Signed)
Subjective:    Patient ID: [name], female    DOB: March 19, 1928, 83 y.o.   MRN: [id number]  HPI   83 year old female is here today for hospital follow-up.  Unfortunately she got a secondary infection in her aesthetic device in her right leg.  They had to remove the hardware.  She then fell at the beginning of November and fractured her patella.  She was discharged to a skilled nursing facility call [facility] and was there for 21 days.  She is now been home since December 4 and is doing much better.  She is now getting home health coming out 3 days/week.  She is currently in an immobilizer for her right leg until December 27 in which case she is hoping she will be able to transition to a light weight immobilizer.  They initially stopped her losartan during the hospitalization but in the rehab center her blood pressure started to elevate and they restarted it.  She is requesting a refill on that today.  Diabetes - no hypoglycemic events. No wounds or sores that are not healing well. No increased thirst or urination. Checking glucose at home. Taking medications as prescribed without any side effects.  So wonders if there is any medication besides the Januvia that she might be able to take that is cheaper as she ends up in her Medicare gap very early in the year.  She is Artie tried metformin and glipizide and was unable to tolerate either one.  She is still using her insulin.  Says the neuropathy in her feet have been severe.  In fact the worst day was yesterday.  While she was in her nursing facility they were only giving her 1 tab of her gabapentin.  She normally takes 1 in the morning 1 in the afternoon and 2 at bedtime.  Since being home on the fourth she is been taking her regular scheduled gabapentin but for some reason yesterday her pain was actually worse in her feet though it does feel a little bit better today.   Review of Systems BP (!) 120/57   Pulse 77   Ht 5\' 7"  (1.702 m)    SpO2 96%   BMI 29.91 kg/m     Allergies  Allergen Reactions  . Ace Inhibitors     REACTION: cough  . Angiotensin Receptor Blockers Other (See Comments)    Renal dx.  Nephrology has recommended against use bc of hyperkalemia  . Fosinopril Sodium     REACTION: Cough  . Glipizide Other (See Comments)    Hypoglycemia  . Metformin And Related Other (See Comments)    Renal function  . Sulfamethoxazole-Trimethoprim     REACTION: Unspecified    Past Medical History:  Diagnosis Date  . Anemia    Pernicious  . Atrial fibrillation ([place]) 1/11    Dr Gwenlyn Found  . Back pain   . Chronic anticoagulation    on coumadin  . Chronic kidney disease    chronic, stage III  . Complete heart block ([place]) 01/31/2015  . Cystitis   . Diabetes mellitus    w/neuro manifestations, type II  . Diarrhea   . Esophageal stricture   . GERD (gastroesophageal reflux disease)   . H/O cardiac catheterization 2010   normal coronary arteries, EF 60%  . Heart failure    chronic diatolic- 2 D echo 0/97 EF 60%  . Heart murmur   . Hiatal hernia   . History of kidney stones   .  Hx of echocardiogram 04/04/13   EF 55-60%, mild MR  . Hyperlipidemia   . Hypertension   . IBS (irritable bowel syndrome)   . Memory loss   . Microalbuminuria   . Non-stress test nonreactive 11/14   Lexiscan myoview negative for ischemia  . Osteoarthrosis involving multiple sites   . PAF (paroxysmal atrial fibrillation) ([place])    Last DCCV 12/29/2009  . Pancreatitis   . Peptic ulcer    unspec. w/o obstruction  . RBBB   . Senile osteoporosis   . Swelling    limb, left more than right ankle   . Symptomatic bradycardia 01/2010   CHB, temp pacer and then Medtronic PPM  . Thyroid disease   . Tubular adenoma of colon   . Ulcer    Gastric    Past Surgical History:  Procedure Laterality Date  . APPENDECTOMY    . CARDIAC CATHETERIZATION  2010   normal coronary arteries  . carotid dopplers- no significant stenosis  2007  . CATARACT  EXTRACTION     both eyes  . EGD- esophageal stricture     gastric ulcer  . pacemaker placement  2/11   medtronic dual chamber  . TONSILLECTOMY AND ADENOIDECTOMY      Social History   Socioeconomic History  . Marital status: Widowed    Spouse name: Not on file  . Number of children: Not on file  . Years of education: Not on file  . Highest education level: Not on file  Social Needs  . Financial resource strain: Not on file  . Food insecurity - worry: Not on file  . Food insecurity - inability: Not on file  . Transportation needs - medical: Not on file  . Transportation needs - non-medical: Not on file  Occupational History  . Not on file  Tobacco Use  . Smoking status: Never Smoker  . Smokeless tobacco: Never Used  Substance and Sexual Activity  . Alcohol use: No  . Drug use: No  . Sexual activity: No  Other Topics Concern  . Not on file  Social History Narrative  . Not on file    Family History  Problem Relation Age of Onset  . Diabetes Mother   . Prostate cancer Father   . Heart attack Father   . Colon cancer Sister   . Esophageal cancer Neg Hx   . Rectal cancer Neg Hx   . Stomach cancer Neg Hx   . Alzheimer's disease Sister   . Heart disease Brother     Outpatient Encounter Medications as of 12/14/2017  Medication Sig  . ACCU-CHEK COMPACT PLUS test strip USE TO CHECK BLOOD SUGAR TWICE DAILY  . ascorbic acid (VITAMIN C) 500 MG tablet Take 500 mg by mouth 3 (three) times daily with meals.  Marland Kitchen b complex vitamins capsule Take 1 capsule by mouth daily.  . bifidobacterium infantis (ALIGN) capsule 1 capsule by mouth daily  . calcium citrate (CALCITRATE - DOSED IN MG ELEMENTAL CALCIUM) 950 MG tablet Take 950 mg by mouth daily.  . clotrimazole (LOTRIMIN) 1 % cream Apply 1 application topically 2 (two) times daily. Apply to affected areas twice daily  . gabapentin (NEURONTIN) 600 MG tablet Take 600 mg by mouth at bedtime.  Marland Kitchen HYDROcodone-acetaminophen (NORCO/VICODIN)  5-325 MG tablet Take 1 tablet by mouth every 6 (six) hours as needed. Take with food  . Insulin Glargine (LANTUS SOLOSTAR) 100 UNIT/ML Solostar Pen INJECT 28 UNITS INTO THE SKIN 2 (TWO) TIMES DAILY.  Marland Kitchen  insulin regular (HUMULIN R) 100 units/mL injection 5 units SQ 10 minutes before evening meal.  . Insulin Syringe-Needle U-100 (EASY COMFORT INSULIN SYRINGE) 30G X 5/16" 0.5 ML MISC Use to inject insulin. Dx:E  . JANUVIA 100 MG tablet Take 1 tablet by mouth daily.  Marland Kitchen levothyroxine (SYNTHROID, LEVOTHROID) 100 MCG tablet TAKE ONE TABLET BY MOUTH DAILY  . memantine (NAMENDA) 5 MG tablet TAKE 1 TABLET BY MOUTH AT BEDTIME  . metoprolol tartrate (LOPRESSOR) 25 MG tablet TAKE 1 TABLET (25 MG TOTAL) BY MOUTH 2 (TWO) TIMES DAILY.  . Multiple Vitamins-Minerals (CERTAVITE SENIOR/ANTIOXIDANT) TABS Take 1 tablet by mouth daily.  . pantoprazole (PROTONIX) 40 MG tablet TAKE 1 TABLET (40 MG TOTAL) BY MOUTH DAILY.  Marland Kitchen penicillin v potassium (VEETID) 250 MG tablet Take 250 mg by mouth 3 (three) times daily.  . pioglitazone (ACTOS) 15 MG tablet Take 1 tablet (15 mg total) by mouth daily.  . pravastatin (PRAVACHOL) 40 MG tablet TAKE ONE TABLET BY MOUTH NIGHTLY AT BEDTIME  . zinc oxide 20 % ointment Apply 1 application topically 2 (two) times daily as needed for irritation (to external rectum and buttocks).  . [DISCONTINUED] losartan (COZAAR) 25 MG tablet Take 25 mg by mouth daily.   No facility-administered encounter medications on file as of 12/14/2017.           Objective:   Physical Exam  Constitutional: She is oriented to person, place, and time. She appears well-developed and well-nourished.  HENT:  Head: Normocephalic and atraumatic.  Cardiovascular: Normal rate, regular rhythm and normal heart sounds.  Pulmonary/Chest: Effort normal and breath sounds normal.  Neurological: She is alert and oriented to person, place, and time.  Skin: Skin is warm and dry.  Psychiatric: She has a normal mood and affect.  Her behavior is normal.          Assessment & Plan:  Neuropathy-continue with gabapentin.  She will need refills we will send over to pharmacy today.  We will also go ahead and just check her thyroid a little early.  Sometimes after surgery and stressed the thyroid levels can change and that could actually be affecting her neuropathy.  Diabetes-due for hemoglobin A1c today.  Will check on her blood work.  In addition we will try Actos.  There is a contraindication with level 3 heart failure and is cautioned with levels 1 through 2.  She has just some mild diastolic heart failure and has not had any symptoms recently.  Just monitor for any swelling or fluid retention and stop the medication immediately if this occurs.  Hyperlipidemia-she unfortunately has her Medicare gap very early in the year usually in the spring.  We can consider switching her to simvastatin 40 mg and take a half a tab daily.  It is $10 for 90-day supply at [facility] which she can then split in half which will last 6 months.  Difficulty affording medications-we had a discussion around strategies to help her better for medications.  As of January 1 if she is able to access her insurance company's formulary we can make some changes and substitutions if needed.  She may want to consider paying for her generics out-of-pocket so it does not go towards her Medicare gap.

## 2017-12-14 NOTE — Telephone Encounter (Signed)
We can consider switching her to simvastatin 40 mg and take a half a tab daily.  It is $10 for 90-day supply at [facility] which she can then split in half which will last 6 months. This would be in place of the pravastatin.

## 2017-12-14 NOTE — Patient Instructions (Signed)
We can try changing your Januvia to Actos which does have a generic.  Supply.  Monitor for any swelling or retaining of fluid.  If you feel like this is happening then you need to stop the Actos.

## 2017-12-14 NOTE — Telephone Encounter (Signed)
Called and informed pt's daughter of this. She is fine with the switch.[name] [place]

## 2017-12-15 DIAGNOSIS — E1140 Type 2 diabetes mellitus with diabetic neuropathy, unspecified: Secondary | ICD-10-CM | POA: Diagnosis not present

## 2017-12-15 DIAGNOSIS — M810 Age-related osteoporosis without current pathological fracture: Secondary | ICD-10-CM | POA: Diagnosis not present

## 2017-12-15 DIAGNOSIS — S82421D Displaced transverse fracture of shaft of right fibula, subsequent encounter for closed fracture with routine healing: Secondary | ICD-10-CM | POA: Diagnosis not present

## 2017-12-15 DIAGNOSIS — I130 Hypertensive heart and chronic kidney disease with heart failure and stage 1 through stage 4 chronic kidney disease, or unspecified chronic kidney disease: Secondary | ICD-10-CM | POA: Diagnosis not present

## 2017-12-15 DIAGNOSIS — I5032 Chronic diastolic (congestive) heart failure: Secondary | ICD-10-CM | POA: Diagnosis not present

## 2017-12-15 DIAGNOSIS — S82101D Unspecified fracture of upper end of right tibia, subsequent encounter for closed fracture with routine healing: Secondary | ICD-10-CM | POA: Diagnosis not present

## 2017-12-15 DIAGNOSIS — S82001D Unspecified fracture of right patella, subsequent encounter for closed fracture with routine healing: Secondary | ICD-10-CM | POA: Diagnosis not present

## 2017-12-15 DIAGNOSIS — I2510 Atherosclerotic heart disease of native coronary artery without angina pectoris: Secondary | ICD-10-CM | POA: Diagnosis not present

## 2017-12-15 DIAGNOSIS — I480 Paroxysmal atrial fibrillation: Secondary | ICD-10-CM | POA: Diagnosis not present

## 2017-12-15 DIAGNOSIS — N183 Chronic kidney disease, stage 3 (moderate): Secondary | ICD-10-CM | POA: Diagnosis not present

## 2017-12-15 DIAGNOSIS — E1122 Type 2 diabetes mellitus with diabetic chronic kidney disease: Secondary | ICD-10-CM | POA: Diagnosis not present

## 2017-12-15 DIAGNOSIS — M150 Primary generalized (osteo)arthritis: Secondary | ICD-10-CM | POA: Diagnosis not present

## 2017-12-15 DIAGNOSIS — S82221D Displaced transverse fracture of shaft of right tibia, subsequent encounter for closed fracture with routine healing: Secondary | ICD-10-CM | POA: Diagnosis not present

## 2017-12-15 LAB — TSH: TSH: 3.07 mIU/L (ref 0.40–4.50)

## 2017-12-15 LAB — COMPLETE METABOLIC PANEL WITH GFR
AG RATIO: 1.5 (calc) (ref 1.0–2.5)
ALKALINE PHOSPHATASE (APISO): 88 U/L (ref 33–130)
ALT: 14 U/L (ref 6–29)
AST: 28 U/L (ref 10–35)
Albumin: 3.8 g/dL (ref 3.6–5.1)
BILIRUBIN TOTAL: 0.6 mg/dL (ref 0.2–1.2)
BUN/Creatinine Ratio: 13 (calc) (ref 6–22)
BUN: 15 mg/dL (ref 7–25)
CHLORIDE: 107 mmol/L (ref 98–110)
CO2: 17 mmol/L — ABNORMAL LOW (ref 20–32)
Calcium: 9.6 mg/dL (ref 8.6–10.4)
Creat: 1.15 mg/dL — ABNORMAL HIGH (ref 0.60–0.88)
GFR, Est African American: 50 mL/min/{1.73_m2} — ABNORMAL LOW (ref >=60)
GFR, Est Non African American: 43 mL/min/{1.73_m2} — ABNORMAL LOW (ref >=60)
GLUCOSE: 151 mg/dL — AB (ref 65–99)
Globulin: 2.5 g/dL (calc) (ref 1.9–3.7)
POTASSIUM: 4.8 mmol/L (ref 3.5–5.3)
Sodium: 144 mmol/L (ref 135–146)
Total Protein: 6.3 g/dL (ref 6.1–8.1)

## 2017-12-15 LAB — HEMOGLOBIN A1C
HEMOGLOBIN A1C: 6.4 %{Hb} — AB (ref <5.7)
MEAN PLASMA GLUCOSE: 137 (calc)
eAG (mmol/L): 7.6 (calc)

## 2017-12-15 LAB — VITAMIN D 25 HYDROXY (VIT D DEFICIENCY, FRACTURES): Vit D, 25-Hydroxy: 55 ng/mL (ref 30–100)

## 2017-12-16 DIAGNOSIS — S82421D Displaced transverse fracture of shaft of right fibula, subsequent encounter for closed fracture with routine healing: Secondary | ICD-10-CM | POA: Diagnosis not present

## 2017-12-16 DIAGNOSIS — E1140 Type 2 diabetes mellitus with diabetic neuropathy, unspecified: Secondary | ICD-10-CM | POA: Diagnosis not present

## 2017-12-16 DIAGNOSIS — I5032 Chronic diastolic (congestive) heart failure: Secondary | ICD-10-CM | POA: Diagnosis not present

## 2017-12-16 DIAGNOSIS — S82221D Displaced transverse fracture of shaft of right tibia, subsequent encounter for closed fracture with routine healing: Secondary | ICD-10-CM | POA: Diagnosis not present

## 2017-12-16 DIAGNOSIS — E1122 Type 2 diabetes mellitus with diabetic chronic kidney disease: Secondary | ICD-10-CM | POA: Diagnosis not present

## 2017-12-16 DIAGNOSIS — M810 Age-related osteoporosis without current pathological fracture: Secondary | ICD-10-CM | POA: Diagnosis not present

## 2017-12-16 DIAGNOSIS — I480 Paroxysmal atrial fibrillation: Secondary | ICD-10-CM | POA: Diagnosis not present

## 2017-12-16 DIAGNOSIS — N183 Chronic kidney disease, stage 3 (moderate): Secondary | ICD-10-CM | POA: Diagnosis not present

## 2017-12-16 DIAGNOSIS — S82001D Unspecified fracture of right patella, subsequent encounter for closed fracture with routine healing: Secondary | ICD-10-CM | POA: Diagnosis not present

## 2017-12-16 DIAGNOSIS — I130 Hypertensive heart and chronic kidney disease with heart failure and stage 1 through stage 4 chronic kidney disease, or unspecified chronic kidney disease: Secondary | ICD-10-CM | POA: Diagnosis not present

## 2017-12-16 DIAGNOSIS — S82101D Unspecified fracture of upper end of right tibia, subsequent encounter for closed fracture with routine healing: Secondary | ICD-10-CM | POA: Diagnosis not present

## 2017-12-16 DIAGNOSIS — I2510 Atherosclerotic heart disease of native coronary artery without angina pectoris: Secondary | ICD-10-CM | POA: Diagnosis not present

## 2017-12-16 DIAGNOSIS — M150 Primary generalized (osteo)arthritis: Secondary | ICD-10-CM | POA: Diagnosis not present

## 2017-12-21 ENCOUNTER — Ambulatory Visit (INDEPENDENT_AMBULATORY_CARE_PROVIDER_SITE_OTHER): Payer: Medicare Other | Admitting: Pharmacist Clinician (PhC)/ Clinical Pharmacy Specialist

## 2017-12-21 DIAGNOSIS — N183 Chronic kidney disease, stage 3 (moderate): Secondary | ICD-10-CM | POA: Diagnosis not present

## 2017-12-21 DIAGNOSIS — M150 Primary generalized (osteo)arthritis: Secondary | ICD-10-CM | POA: Diagnosis not present

## 2017-12-21 DIAGNOSIS — Z5181 Encounter for therapeutic drug level monitoring: Secondary | ICD-10-CM | POA: Diagnosis not present

## 2017-12-21 DIAGNOSIS — S82421D Displaced transverse fracture of shaft of right fibula, subsequent encounter for closed fracture with routine healing: Secondary | ICD-10-CM | POA: Diagnosis not present

## 2017-12-21 DIAGNOSIS — I5032 Chronic diastolic (congestive) heart failure: Secondary | ICD-10-CM | POA: Diagnosis not present

## 2017-12-21 DIAGNOSIS — E1122 Type 2 diabetes mellitus with diabetic chronic kidney disease: Secondary | ICD-10-CM | POA: Diagnosis not present

## 2017-12-21 DIAGNOSIS — Z7901 Long term (current) use of anticoagulants: Secondary | ICD-10-CM | POA: Diagnosis not present

## 2017-12-21 DIAGNOSIS — S82221D Displaced transverse fracture of shaft of right tibia, subsequent encounter for closed fracture with routine healing: Secondary | ICD-10-CM | POA: Diagnosis not present

## 2017-12-21 DIAGNOSIS — E1140 Type 2 diabetes mellitus with diabetic neuropathy, unspecified: Secondary | ICD-10-CM | POA: Diagnosis not present

## 2017-12-21 DIAGNOSIS — I130 Hypertensive heart and chronic kidney disease with heart failure and stage 1 through stage 4 chronic kidney disease, or unspecified chronic kidney disease: Secondary | ICD-10-CM | POA: Diagnosis not present

## 2017-12-21 DIAGNOSIS — M810 Age-related osteoporosis without current pathological fracture: Secondary | ICD-10-CM | POA: Diagnosis not present

## 2017-12-21 DIAGNOSIS — S82001D Unspecified fracture of right patella, subsequent encounter for closed fracture with routine healing: Secondary | ICD-10-CM | POA: Diagnosis not present

## 2017-12-21 DIAGNOSIS — I480 Paroxysmal atrial fibrillation: Secondary | ICD-10-CM | POA: Diagnosis not present

## 2017-12-21 DIAGNOSIS — I2510 Atherosclerotic heart disease of native coronary artery without angina pectoris: Secondary | ICD-10-CM | POA: Diagnosis not present

## 2017-12-21 DIAGNOSIS — S82101D Unspecified fracture of upper end of right tibia, subsequent encounter for closed fracture with routine healing: Secondary | ICD-10-CM | POA: Diagnosis not present

## 2017-12-21 LAB — POCT INR: INR: 1.4

## 2017-12-22 ENCOUNTER — Other Ambulatory Visit: Payer: Self-pay | Admitting: Family Medicine

## 2017-12-22 DIAGNOSIS — S82191D Other fracture of upper end of right tibia, subsequent encounter for closed fracture with routine healing: Secondary | ICD-10-CM | POA: Diagnosis not present

## 2017-12-22 DIAGNOSIS — S82021D Displaced longitudinal fracture of right patella, subsequent encounter for closed fracture with routine healing: Secondary | ICD-10-CM | POA: Diagnosis not present

## 2017-12-22 DIAGNOSIS — S82091D Other fracture of right patella, subsequent encounter for closed fracture with routine healing: Secondary | ICD-10-CM | POA: Diagnosis not present

## 2017-12-22 DIAGNOSIS — M150 Primary generalized (osteo)arthritis: Secondary | ICD-10-CM | POA: Diagnosis not present

## 2017-12-22 DIAGNOSIS — I2510 Atherosclerotic heart disease of native coronary artery without angina pectoris: Secondary | ICD-10-CM | POA: Diagnosis not present

## 2017-12-22 DIAGNOSIS — I5032 Chronic diastolic (congestive) heart failure: Secondary | ICD-10-CM | POA: Diagnosis not present

## 2017-12-22 DIAGNOSIS — N183 Chronic kidney disease, stage 3 (moderate): Secondary | ICD-10-CM | POA: Diagnosis not present

## 2017-12-22 DIAGNOSIS — E1140 Type 2 diabetes mellitus with diabetic neuropathy, unspecified: Secondary | ICD-10-CM | POA: Diagnosis not present

## 2017-12-22 DIAGNOSIS — S82101D Unspecified fracture of upper end of right tibia, subsequent encounter for closed fracture with routine healing: Secondary | ICD-10-CM | POA: Diagnosis not present

## 2017-12-22 DIAGNOSIS — I480 Paroxysmal atrial fibrillation: Secondary | ICD-10-CM | POA: Diagnosis not present

## 2017-12-22 DIAGNOSIS — S82221D Displaced transverse fracture of shaft of right tibia, subsequent encounter for closed fracture with routine healing: Secondary | ICD-10-CM | POA: Diagnosis not present

## 2017-12-22 DIAGNOSIS — S82421D Displaced transverse fracture of shaft of right fibula, subsequent encounter for closed fracture with routine healing: Secondary | ICD-10-CM | POA: Diagnosis not present

## 2017-12-22 DIAGNOSIS — S82001D Unspecified fracture of right patella, subsequent encounter for closed fracture with routine healing: Secondary | ICD-10-CM | POA: Diagnosis not present

## 2017-12-22 DIAGNOSIS — E1122 Type 2 diabetes mellitus with diabetic chronic kidney disease: Secondary | ICD-10-CM | POA: Diagnosis not present

## 2017-12-22 DIAGNOSIS — S82831D Other fracture of upper and lower end of right fibula, subsequent encounter for closed fracture with routine healing: Secondary | ICD-10-CM | POA: Diagnosis not present

## 2017-12-22 DIAGNOSIS — M810 Age-related osteoporosis without current pathological fracture: Secondary | ICD-10-CM | POA: Diagnosis not present

## 2017-12-22 DIAGNOSIS — I130 Hypertensive heart and chronic kidney disease with heart failure and stage 1 through stage 4 chronic kidney disease, or unspecified chronic kidney disease: Secondary | ICD-10-CM | POA: Diagnosis not present

## 2017-12-23 DIAGNOSIS — E1140 Type 2 diabetes mellitus with diabetic neuropathy, unspecified: Secondary | ICD-10-CM | POA: Diagnosis not present

## 2017-12-23 DIAGNOSIS — S82221D Displaced transverse fracture of shaft of right tibia, subsequent encounter for closed fracture with routine healing: Secondary | ICD-10-CM | POA: Diagnosis not present

## 2017-12-23 DIAGNOSIS — M150 Primary generalized (osteo)arthritis: Secondary | ICD-10-CM | POA: Diagnosis not present

## 2017-12-23 DIAGNOSIS — I5032 Chronic diastolic (congestive) heart failure: Secondary | ICD-10-CM | POA: Diagnosis not present

## 2017-12-23 DIAGNOSIS — I2510 Atherosclerotic heart disease of native coronary artery without angina pectoris: Secondary | ICD-10-CM | POA: Diagnosis not present

## 2017-12-23 DIAGNOSIS — I480 Paroxysmal atrial fibrillation: Secondary | ICD-10-CM | POA: Diagnosis not present

## 2017-12-23 DIAGNOSIS — E1122 Type 2 diabetes mellitus with diabetic chronic kidney disease: Secondary | ICD-10-CM | POA: Diagnosis not present

## 2017-12-23 DIAGNOSIS — N183 Chronic kidney disease, stage 3 (moderate): Secondary | ICD-10-CM | POA: Diagnosis not present

## 2017-12-23 DIAGNOSIS — I130 Hypertensive heart and chronic kidney disease with heart failure and stage 1 through stage 4 chronic kidney disease, or unspecified chronic kidney disease: Secondary | ICD-10-CM | POA: Diagnosis not present

## 2017-12-23 DIAGNOSIS — M810 Age-related osteoporosis without current pathological fracture: Secondary | ICD-10-CM | POA: Diagnosis not present

## 2017-12-23 DIAGNOSIS — S82421D Displaced transverse fracture of shaft of right fibula, subsequent encounter for closed fracture with routine healing: Secondary | ICD-10-CM | POA: Diagnosis not present

## 2017-12-23 DIAGNOSIS — S82001D Unspecified fracture of right patella, subsequent encounter for closed fracture with routine healing: Secondary | ICD-10-CM | POA: Diagnosis not present

## 2017-12-23 DIAGNOSIS — S82101D Unspecified fracture of upper end of right tibia, subsequent encounter for closed fracture with routine healing: Secondary | ICD-10-CM | POA: Diagnosis not present

## 2017-12-26 DIAGNOSIS — S82421D Displaced transverse fracture of shaft of right fibula, subsequent encounter for closed fracture with routine healing: Secondary | ICD-10-CM | POA: Diagnosis not present

## 2017-12-26 DIAGNOSIS — N183 Chronic kidney disease, stage 3 (moderate): Secondary | ICD-10-CM | POA: Diagnosis not present

## 2017-12-26 DIAGNOSIS — M810 Age-related osteoporosis without current pathological fracture: Secondary | ICD-10-CM | POA: Diagnosis not present

## 2017-12-26 DIAGNOSIS — E1122 Type 2 diabetes mellitus with diabetic chronic kidney disease: Secondary | ICD-10-CM | POA: Diagnosis not present

## 2017-12-26 DIAGNOSIS — I2510 Atherosclerotic heart disease of native coronary artery without angina pectoris: Secondary | ICD-10-CM | POA: Diagnosis not present

## 2017-12-26 DIAGNOSIS — S82001D Unspecified fracture of right patella, subsequent encounter for closed fracture with routine healing: Secondary | ICD-10-CM | POA: Diagnosis not present

## 2017-12-26 DIAGNOSIS — M150 Primary generalized (osteo)arthritis: Secondary | ICD-10-CM | POA: Diagnosis not present

## 2017-12-26 DIAGNOSIS — S82101D Unspecified fracture of upper end of right tibia, subsequent encounter for closed fracture with routine healing: Secondary | ICD-10-CM | POA: Diagnosis not present

## 2017-12-26 DIAGNOSIS — I5032 Chronic diastolic (congestive) heart failure: Secondary | ICD-10-CM | POA: Diagnosis not present

## 2017-12-26 DIAGNOSIS — I480 Paroxysmal atrial fibrillation: Secondary | ICD-10-CM | POA: Diagnosis not present

## 2017-12-26 DIAGNOSIS — E1140 Type 2 diabetes mellitus with diabetic neuropathy, unspecified: Secondary | ICD-10-CM | POA: Diagnosis not present

## 2017-12-26 DIAGNOSIS — I130 Hypertensive heart and chronic kidney disease with heart failure and stage 1 through stage 4 chronic kidney disease, or unspecified chronic kidney disease: Secondary | ICD-10-CM | POA: Diagnosis not present

## 2017-12-26 DIAGNOSIS — S82221D Displaced transverse fracture of shaft of right tibia, subsequent encounter for closed fracture with routine healing: Secondary | ICD-10-CM | POA: Diagnosis not present

## 2017-12-28 ENCOUNTER — Ambulatory Visit (INDEPENDENT_AMBULATORY_CARE_PROVIDER_SITE_OTHER): Payer: Medicare Other | Admitting: Pharmacist

## 2017-12-28 DIAGNOSIS — Z7901 Long term (current) use of anticoagulants: Secondary | ICD-10-CM | POA: Diagnosis not present

## 2017-12-28 DIAGNOSIS — E1140 Type 2 diabetes mellitus with diabetic neuropathy, unspecified: Secondary | ICD-10-CM | POA: Diagnosis not present

## 2017-12-28 DIAGNOSIS — I130 Hypertensive heart and chronic kidney disease with heart failure and stage 1 through stage 4 chronic kidney disease, or unspecified chronic kidney disease: Secondary | ICD-10-CM | POA: Diagnosis not present

## 2017-12-28 DIAGNOSIS — S82221D Displaced transverse fracture of shaft of right tibia, subsequent encounter for closed fracture with routine healing: Secondary | ICD-10-CM | POA: Diagnosis not present

## 2017-12-28 DIAGNOSIS — M150 Primary generalized (osteo)arthritis: Secondary | ICD-10-CM | POA: Diagnosis not present

## 2017-12-28 DIAGNOSIS — I5032 Chronic diastolic (congestive) heart failure: Secondary | ICD-10-CM | POA: Diagnosis not present

## 2017-12-28 DIAGNOSIS — M810 Age-related osteoporosis without current pathological fracture: Secondary | ICD-10-CM | POA: Diagnosis not present

## 2017-12-28 DIAGNOSIS — I2510 Atherosclerotic heart disease of native coronary artery without angina pectoris: Secondary | ICD-10-CM | POA: Diagnosis not present

## 2017-12-28 DIAGNOSIS — E1122 Type 2 diabetes mellitus with diabetic chronic kidney disease: Secondary | ICD-10-CM | POA: Diagnosis not present

## 2017-12-28 DIAGNOSIS — N183 Chronic kidney disease, stage 3 (moderate): Secondary | ICD-10-CM | POA: Diagnosis not present

## 2017-12-28 DIAGNOSIS — S82421D Displaced transverse fracture of shaft of right fibula, subsequent encounter for closed fracture with routine healing: Secondary | ICD-10-CM | POA: Diagnosis not present

## 2017-12-28 DIAGNOSIS — S82101D Unspecified fracture of upper end of right tibia, subsequent encounter for closed fracture with routine healing: Secondary | ICD-10-CM | POA: Diagnosis not present

## 2017-12-28 DIAGNOSIS — S82001D Unspecified fracture of right patella, subsequent encounter for closed fracture with routine healing: Secondary | ICD-10-CM | POA: Diagnosis not present

## 2017-12-28 DIAGNOSIS — I480 Paroxysmal atrial fibrillation: Secondary | ICD-10-CM | POA: Diagnosis not present

## 2017-12-28 LAB — POCT INR: INR: 1.1

## 2017-12-29 DIAGNOSIS — M150 Primary generalized (osteo)arthritis: Secondary | ICD-10-CM | POA: Diagnosis not present

## 2017-12-29 DIAGNOSIS — I2510 Atherosclerotic heart disease of native coronary artery without angina pectoris: Secondary | ICD-10-CM | POA: Diagnosis not present

## 2017-12-29 DIAGNOSIS — E1140 Type 2 diabetes mellitus with diabetic neuropathy, unspecified: Secondary | ICD-10-CM | POA: Diagnosis not present

## 2017-12-29 DIAGNOSIS — S82101D Unspecified fracture of upper end of right tibia, subsequent encounter for closed fracture with routine healing: Secondary | ICD-10-CM | POA: Diagnosis not present

## 2017-12-29 DIAGNOSIS — I5032 Chronic diastolic (congestive) heart failure: Secondary | ICD-10-CM | POA: Diagnosis not present

## 2017-12-29 DIAGNOSIS — M810 Age-related osteoporosis without current pathological fracture: Secondary | ICD-10-CM | POA: Diagnosis not present

## 2017-12-29 DIAGNOSIS — S82421D Displaced transverse fracture of shaft of right fibula, subsequent encounter for closed fracture with routine healing: Secondary | ICD-10-CM | POA: Diagnosis not present

## 2017-12-29 DIAGNOSIS — I130 Hypertensive heart and chronic kidney disease with heart failure and stage 1 through stage 4 chronic kidney disease, or unspecified chronic kidney disease: Secondary | ICD-10-CM | POA: Diagnosis not present

## 2017-12-29 DIAGNOSIS — S82001D Unspecified fracture of right patella, subsequent encounter for closed fracture with routine healing: Secondary | ICD-10-CM | POA: Diagnosis not present

## 2017-12-29 DIAGNOSIS — E1122 Type 2 diabetes mellitus with diabetic chronic kidney disease: Secondary | ICD-10-CM | POA: Diagnosis not present

## 2017-12-29 DIAGNOSIS — I480 Paroxysmal atrial fibrillation: Secondary | ICD-10-CM | POA: Diagnosis not present

## 2017-12-29 DIAGNOSIS — S82221D Displaced transverse fracture of shaft of right tibia, subsequent encounter for closed fracture with routine healing: Secondary | ICD-10-CM | POA: Diagnosis not present

## 2017-12-29 DIAGNOSIS — N183 Chronic kidney disease, stage 3 (moderate): Secondary | ICD-10-CM | POA: Diagnosis not present

## 2017-12-30 DIAGNOSIS — S82421D Displaced transverse fracture of shaft of right fibula, subsequent encounter for closed fracture with routine healing: Secondary | ICD-10-CM | POA: Diagnosis not present

## 2017-12-30 DIAGNOSIS — E1140 Type 2 diabetes mellitus with diabetic neuropathy, unspecified: Secondary | ICD-10-CM | POA: Diagnosis not present

## 2017-12-30 DIAGNOSIS — S82221D Displaced transverse fracture of shaft of right tibia, subsequent encounter for closed fracture with routine healing: Secondary | ICD-10-CM | POA: Diagnosis not present

## 2017-12-30 DIAGNOSIS — S82101D Unspecified fracture of upper end of right tibia, subsequent encounter for closed fracture with routine healing: Secondary | ICD-10-CM | POA: Diagnosis not present

## 2017-12-30 DIAGNOSIS — M810 Age-related osteoporosis without current pathological fracture: Secondary | ICD-10-CM | POA: Diagnosis not present

## 2017-12-30 DIAGNOSIS — I2510 Atherosclerotic heart disease of native coronary artery without angina pectoris: Secondary | ICD-10-CM | POA: Diagnosis not present

## 2017-12-30 DIAGNOSIS — M150 Primary generalized (osteo)arthritis: Secondary | ICD-10-CM | POA: Diagnosis not present

## 2017-12-30 DIAGNOSIS — S82001D Unspecified fracture of right patella, subsequent encounter for closed fracture with routine healing: Secondary | ICD-10-CM | POA: Diagnosis not present

## 2017-12-30 DIAGNOSIS — I5032 Chronic diastolic (congestive) heart failure: Secondary | ICD-10-CM | POA: Diagnosis not present

## 2017-12-30 DIAGNOSIS — E1122 Type 2 diabetes mellitus with diabetic chronic kidney disease: Secondary | ICD-10-CM | POA: Diagnosis not present

## 2017-12-30 DIAGNOSIS — I480 Paroxysmal atrial fibrillation: Secondary | ICD-10-CM | POA: Diagnosis not present

## 2017-12-30 DIAGNOSIS — N183 Chronic kidney disease, stage 3 (moderate): Secondary | ICD-10-CM | POA: Diagnosis not present

## 2017-12-30 DIAGNOSIS — I130 Hypertensive heart and chronic kidney disease with heart failure and stage 1 through stage 4 chronic kidney disease, or unspecified chronic kidney disease: Secondary | ICD-10-CM | POA: Diagnosis not present

## 2018-01-01 DIAGNOSIS — G8929 Other chronic pain: Secondary | ICD-10-CM | POA: Diagnosis not present

## 2018-01-02 DIAGNOSIS — S82001D Unspecified fracture of right patella, subsequent encounter for closed fracture with routine healing: Secondary | ICD-10-CM | POA: Diagnosis not present

## 2018-01-02 DIAGNOSIS — S82101D Unspecified fracture of upper end of right tibia, subsequent encounter for closed fracture with routine healing: Secondary | ICD-10-CM | POA: Diagnosis not present

## 2018-01-02 DIAGNOSIS — E1122 Type 2 diabetes mellitus with diabetic chronic kidney disease: Secondary | ICD-10-CM | POA: Diagnosis not present

## 2018-01-02 DIAGNOSIS — N183 Chronic kidney disease, stage 3 (moderate): Secondary | ICD-10-CM | POA: Diagnosis not present

## 2018-01-02 DIAGNOSIS — S82221D Displaced transverse fracture of shaft of right tibia, subsequent encounter for closed fracture with routine healing: Secondary | ICD-10-CM | POA: Diagnosis not present

## 2018-01-02 DIAGNOSIS — S82421D Displaced transverse fracture of shaft of right fibula, subsequent encounter for closed fracture with routine healing: Secondary | ICD-10-CM | POA: Diagnosis not present

## 2018-01-02 DIAGNOSIS — M150 Primary generalized (osteo)arthritis: Secondary | ICD-10-CM | POA: Diagnosis not present

## 2018-01-02 DIAGNOSIS — I130 Hypertensive heart and chronic kidney disease with heart failure and stage 1 through stage 4 chronic kidney disease, or unspecified chronic kidney disease: Secondary | ICD-10-CM | POA: Diagnosis not present

## 2018-01-02 DIAGNOSIS — M810 Age-related osteoporosis without current pathological fracture: Secondary | ICD-10-CM | POA: Diagnosis not present

## 2018-01-02 DIAGNOSIS — E1140 Type 2 diabetes mellitus with diabetic neuropathy, unspecified: Secondary | ICD-10-CM | POA: Diagnosis not present

## 2018-01-02 DIAGNOSIS — I480 Paroxysmal atrial fibrillation: Secondary | ICD-10-CM | POA: Diagnosis not present

## 2018-01-02 DIAGNOSIS — I5032 Chronic diastolic (congestive) heart failure: Secondary | ICD-10-CM | POA: Diagnosis not present

## 2018-01-02 DIAGNOSIS — I2510 Atherosclerotic heart disease of native coronary artery without angina pectoris: Secondary | ICD-10-CM | POA: Diagnosis not present

## 2018-01-03 ENCOUNTER — Ambulatory Visit (INDEPENDENT_AMBULATORY_CARE_PROVIDER_SITE_OTHER): Payer: Medicare Other | Admitting: Pharmacist Clinician (PhC)/ Clinical Pharmacy Specialist

## 2018-01-03 ENCOUNTER — Other Ambulatory Visit: Payer: Self-pay | Admitting: Family Medicine

## 2018-01-03 DIAGNOSIS — I480 Paroxysmal atrial fibrillation: Secondary | ICD-10-CM | POA: Diagnosis not present

## 2018-01-03 DIAGNOSIS — I5032 Chronic diastolic (congestive) heart failure: Secondary | ICD-10-CM | POA: Diagnosis not present

## 2018-01-03 DIAGNOSIS — I2510 Atherosclerotic heart disease of native coronary artery without angina pectoris: Secondary | ICD-10-CM | POA: Diagnosis not present

## 2018-01-03 DIAGNOSIS — Z7901 Long term (current) use of anticoagulants: Secondary | ICD-10-CM

## 2018-01-03 DIAGNOSIS — E1122 Type 2 diabetes mellitus with diabetic chronic kidney disease: Secondary | ICD-10-CM | POA: Diagnosis not present

## 2018-01-03 DIAGNOSIS — N183 Chronic kidney disease, stage 3 (moderate): Secondary | ICD-10-CM | POA: Diagnosis not present

## 2018-01-03 DIAGNOSIS — M810 Age-related osteoporosis without current pathological fracture: Secondary | ICD-10-CM | POA: Diagnosis not present

## 2018-01-03 DIAGNOSIS — E1140 Type 2 diabetes mellitus with diabetic neuropathy, unspecified: Secondary | ICD-10-CM | POA: Diagnosis not present

## 2018-01-03 DIAGNOSIS — S82001D Unspecified fracture of right patella, subsequent encounter for closed fracture with routine healing: Secondary | ICD-10-CM | POA: Diagnosis not present

## 2018-01-03 DIAGNOSIS — S82101D Unspecified fracture of upper end of right tibia, subsequent encounter for closed fracture with routine healing: Secondary | ICD-10-CM | POA: Diagnosis not present

## 2018-01-03 DIAGNOSIS — Z8673 Personal history of transient ischemic attack (TIA), and cerebral infarction without residual deficits: Secondary | ICD-10-CM | POA: Diagnosis not present

## 2018-01-03 DIAGNOSIS — S82221D Displaced transverse fracture of shaft of right tibia, subsequent encounter for closed fracture with routine healing: Secondary | ICD-10-CM | POA: Diagnosis not present

## 2018-01-03 DIAGNOSIS — S82421D Displaced transverse fracture of shaft of right fibula, subsequent encounter for closed fracture with routine healing: Secondary | ICD-10-CM | POA: Diagnosis not present

## 2018-01-03 DIAGNOSIS — I130 Hypertensive heart and chronic kidney disease with heart failure and stage 1 through stage 4 chronic kidney disease, or unspecified chronic kidney disease: Secondary | ICD-10-CM | POA: Diagnosis not present

## 2018-01-03 DIAGNOSIS — M150 Primary generalized (osteo)arthritis: Secondary | ICD-10-CM | POA: Diagnosis not present

## 2018-01-03 LAB — POCT INR: INR: 1.6

## 2018-01-04 DIAGNOSIS — E1140 Type 2 diabetes mellitus with diabetic neuropathy, unspecified: Secondary | ICD-10-CM | POA: Diagnosis not present

## 2018-01-04 DIAGNOSIS — S82421D Displaced transverse fracture of shaft of right fibula, subsequent encounter for closed fracture with routine healing: Secondary | ICD-10-CM | POA: Diagnosis not present

## 2018-01-04 DIAGNOSIS — E1122 Type 2 diabetes mellitus with diabetic chronic kidney disease: Secondary | ICD-10-CM | POA: Diagnosis not present

## 2018-01-04 DIAGNOSIS — M150 Primary generalized (osteo)arthritis: Secondary | ICD-10-CM | POA: Diagnosis not present

## 2018-01-04 DIAGNOSIS — I130 Hypertensive heart and chronic kidney disease with heart failure and stage 1 through stage 4 chronic kidney disease, or unspecified chronic kidney disease: Secondary | ICD-10-CM | POA: Diagnosis not present

## 2018-01-04 DIAGNOSIS — I2510 Atherosclerotic heart disease of native coronary artery without angina pectoris: Secondary | ICD-10-CM | POA: Diagnosis not present

## 2018-01-04 DIAGNOSIS — I480 Paroxysmal atrial fibrillation: Secondary | ICD-10-CM | POA: Diagnosis not present

## 2018-01-04 DIAGNOSIS — N183 Chronic kidney disease, stage 3 (moderate): Secondary | ICD-10-CM | POA: Diagnosis not present

## 2018-01-04 DIAGNOSIS — I5032 Chronic diastolic (congestive) heart failure: Secondary | ICD-10-CM | POA: Diagnosis not present

## 2018-01-04 DIAGNOSIS — S82101D Unspecified fracture of upper end of right tibia, subsequent encounter for closed fracture with routine healing: Secondary | ICD-10-CM | POA: Diagnosis not present

## 2018-01-04 DIAGNOSIS — M810 Age-related osteoporosis without current pathological fracture: Secondary | ICD-10-CM | POA: Diagnosis not present

## 2018-01-04 DIAGNOSIS — S82001D Unspecified fracture of right patella, subsequent encounter for closed fracture with routine healing: Secondary | ICD-10-CM | POA: Diagnosis not present

## 2018-01-04 DIAGNOSIS — S82221D Displaced transverse fracture of shaft of right tibia, subsequent encounter for closed fracture with routine healing: Secondary | ICD-10-CM | POA: Diagnosis not present

## 2018-01-05 DIAGNOSIS — M810 Age-related osteoporosis without current pathological fracture: Secondary | ICD-10-CM | POA: Diagnosis not present

## 2018-01-05 DIAGNOSIS — E1122 Type 2 diabetes mellitus with diabetic chronic kidney disease: Secondary | ICD-10-CM | POA: Diagnosis not present

## 2018-01-05 DIAGNOSIS — S82221D Displaced transverse fracture of shaft of right tibia, subsequent encounter for closed fracture with routine healing: Secondary | ICD-10-CM | POA: Diagnosis not present

## 2018-01-05 DIAGNOSIS — N183 Chronic kidney disease, stage 3 (moderate): Secondary | ICD-10-CM | POA: Diagnosis not present

## 2018-01-05 DIAGNOSIS — I480 Paroxysmal atrial fibrillation: Secondary | ICD-10-CM | POA: Diagnosis not present

## 2018-01-05 DIAGNOSIS — I130 Hypertensive heart and chronic kidney disease with heart failure and stage 1 through stage 4 chronic kidney disease, or unspecified chronic kidney disease: Secondary | ICD-10-CM | POA: Diagnosis not present

## 2018-01-05 DIAGNOSIS — I2510 Atherosclerotic heart disease of native coronary artery without angina pectoris: Secondary | ICD-10-CM | POA: Diagnosis not present

## 2018-01-05 DIAGNOSIS — M150 Primary generalized (osteo)arthritis: Secondary | ICD-10-CM | POA: Diagnosis not present

## 2018-01-05 DIAGNOSIS — S82421D Displaced transverse fracture of shaft of right fibula, subsequent encounter for closed fracture with routine healing: Secondary | ICD-10-CM | POA: Diagnosis not present

## 2018-01-05 DIAGNOSIS — S82001D Unspecified fracture of right patella, subsequent encounter for closed fracture with routine healing: Secondary | ICD-10-CM | POA: Diagnosis not present

## 2018-01-05 DIAGNOSIS — S82101D Unspecified fracture of upper end of right tibia, subsequent encounter for closed fracture with routine healing: Secondary | ICD-10-CM | POA: Diagnosis not present

## 2018-01-05 DIAGNOSIS — E1140 Type 2 diabetes mellitus with diabetic neuropathy, unspecified: Secondary | ICD-10-CM | POA: Diagnosis not present

## 2018-01-05 DIAGNOSIS — I5032 Chronic diastolic (congestive) heart failure: Secondary | ICD-10-CM | POA: Diagnosis not present

## 2018-01-09 DIAGNOSIS — S82421D Displaced transverse fracture of shaft of right fibula, subsequent encounter for closed fracture with routine healing: Secondary | ICD-10-CM | POA: Diagnosis not present

## 2018-01-09 DIAGNOSIS — M810 Age-related osteoporosis without current pathological fracture: Secondary | ICD-10-CM | POA: Diagnosis not present

## 2018-01-09 DIAGNOSIS — S82001D Unspecified fracture of right patella, subsequent encounter for closed fracture with routine healing: Secondary | ICD-10-CM | POA: Diagnosis not present

## 2018-01-09 DIAGNOSIS — E1140 Type 2 diabetes mellitus with diabetic neuropathy, unspecified: Secondary | ICD-10-CM | POA: Diagnosis not present

## 2018-01-09 DIAGNOSIS — I130 Hypertensive heart and chronic kidney disease with heart failure and stage 1 through stage 4 chronic kidney disease, or unspecified chronic kidney disease: Secondary | ICD-10-CM | POA: Diagnosis not present

## 2018-01-09 DIAGNOSIS — E1122 Type 2 diabetes mellitus with diabetic chronic kidney disease: Secondary | ICD-10-CM | POA: Diagnosis not present

## 2018-01-09 DIAGNOSIS — M150 Primary generalized (osteo)arthritis: Secondary | ICD-10-CM | POA: Diagnosis not present

## 2018-01-09 DIAGNOSIS — S82221D Displaced transverse fracture of shaft of right tibia, subsequent encounter for closed fracture with routine healing: Secondary | ICD-10-CM | POA: Diagnosis not present

## 2018-01-09 DIAGNOSIS — I5032 Chronic diastolic (congestive) heart failure: Secondary | ICD-10-CM | POA: Diagnosis not present

## 2018-01-09 DIAGNOSIS — S82101D Unspecified fracture of upper end of right tibia, subsequent encounter for closed fracture with routine healing: Secondary | ICD-10-CM | POA: Diagnosis not present

## 2018-01-09 DIAGNOSIS — I480 Paroxysmal atrial fibrillation: Secondary | ICD-10-CM | POA: Diagnosis not present

## 2018-01-09 DIAGNOSIS — N183 Chronic kidney disease, stage 3 (moderate): Secondary | ICD-10-CM | POA: Diagnosis not present

## 2018-01-09 DIAGNOSIS — I2510 Atherosclerotic heart disease of native coronary artery without angina pectoris: Secondary | ICD-10-CM | POA: Diagnosis not present

## 2018-01-10 ENCOUNTER — Other Ambulatory Visit: Payer: Self-pay | Admitting: Family Medicine

## 2018-01-10 ENCOUNTER — Ambulatory Visit (INDEPENDENT_AMBULATORY_CARE_PROVIDER_SITE_OTHER): Payer: Medicare Other | Admitting: Pharmacist

## 2018-01-10 DIAGNOSIS — S82001D Unspecified fracture of right patella, subsequent encounter for closed fracture with routine healing: Secondary | ICD-10-CM | POA: Diagnosis not present

## 2018-01-10 DIAGNOSIS — I5032 Chronic diastolic (congestive) heart failure: Secondary | ICD-10-CM | POA: Diagnosis not present

## 2018-01-10 DIAGNOSIS — Z7901 Long term (current) use of anticoagulants: Secondary | ICD-10-CM

## 2018-01-10 DIAGNOSIS — I2510 Atherosclerotic heart disease of native coronary artery without angina pectoris: Secondary | ICD-10-CM | POA: Diagnosis not present

## 2018-01-10 DIAGNOSIS — S82101D Unspecified fracture of upper end of right tibia, subsequent encounter for closed fracture with routine healing: Secondary | ICD-10-CM | POA: Diagnosis not present

## 2018-01-10 DIAGNOSIS — S82421D Displaced transverse fracture of shaft of right fibula, subsequent encounter for closed fracture with routine healing: Secondary | ICD-10-CM | POA: Diagnosis not present

## 2018-01-10 DIAGNOSIS — E1122 Type 2 diabetes mellitus with diabetic chronic kidney disease: Secondary | ICD-10-CM | POA: Diagnosis not present

## 2018-01-10 DIAGNOSIS — E1140 Type 2 diabetes mellitus with diabetic neuropathy, unspecified: Secondary | ICD-10-CM | POA: Diagnosis not present

## 2018-01-10 DIAGNOSIS — N183 Chronic kidney disease, stage 3 (moderate): Secondary | ICD-10-CM | POA: Diagnosis not present

## 2018-01-10 DIAGNOSIS — I480 Paroxysmal atrial fibrillation: Secondary | ICD-10-CM | POA: Diagnosis not present

## 2018-01-10 DIAGNOSIS — M150 Primary generalized (osteo)arthritis: Secondary | ICD-10-CM | POA: Diagnosis not present

## 2018-01-10 DIAGNOSIS — I130 Hypertensive heart and chronic kidney disease with heart failure and stage 1 through stage 4 chronic kidney disease, or unspecified chronic kidney disease: Secondary | ICD-10-CM | POA: Diagnosis not present

## 2018-01-10 DIAGNOSIS — M810 Age-related osteoporosis without current pathological fracture: Secondary | ICD-10-CM | POA: Diagnosis not present

## 2018-01-10 DIAGNOSIS — S82221D Displaced transverse fracture of shaft of right tibia, subsequent encounter for closed fracture with routine healing: Secondary | ICD-10-CM | POA: Diagnosis not present

## 2018-01-10 LAB — POCT INR: INR: 1.5

## 2018-01-11 DIAGNOSIS — S82421D Displaced transverse fracture of shaft of right fibula, subsequent encounter for closed fracture with routine healing: Secondary | ICD-10-CM | POA: Diagnosis not present

## 2018-01-11 DIAGNOSIS — I5032 Chronic diastolic (congestive) heart failure: Secondary | ICD-10-CM | POA: Diagnosis not present

## 2018-01-11 DIAGNOSIS — S82101D Unspecified fracture of upper end of right tibia, subsequent encounter for closed fracture with routine healing: Secondary | ICD-10-CM | POA: Diagnosis not present

## 2018-01-11 DIAGNOSIS — M810 Age-related osteoporosis without current pathological fracture: Secondary | ICD-10-CM | POA: Diagnosis not present

## 2018-01-11 DIAGNOSIS — E1140 Type 2 diabetes mellitus with diabetic neuropathy, unspecified: Secondary | ICD-10-CM | POA: Diagnosis not present

## 2018-01-11 DIAGNOSIS — E1122 Type 2 diabetes mellitus with diabetic chronic kidney disease: Secondary | ICD-10-CM | POA: Diagnosis not present

## 2018-01-11 DIAGNOSIS — I130 Hypertensive heart and chronic kidney disease with heart failure and stage 1 through stage 4 chronic kidney disease, or unspecified chronic kidney disease: Secondary | ICD-10-CM | POA: Diagnosis not present

## 2018-01-11 DIAGNOSIS — I2510 Atherosclerotic heart disease of native coronary artery without angina pectoris: Secondary | ICD-10-CM | POA: Diagnosis not present

## 2018-01-11 DIAGNOSIS — N183 Chronic kidney disease, stage 3 (moderate): Secondary | ICD-10-CM | POA: Diagnosis not present

## 2018-01-11 DIAGNOSIS — S82221D Displaced transverse fracture of shaft of right tibia, subsequent encounter for closed fracture with routine healing: Secondary | ICD-10-CM | POA: Diagnosis not present

## 2018-01-11 DIAGNOSIS — I480 Paroxysmal atrial fibrillation: Secondary | ICD-10-CM | POA: Diagnosis not present

## 2018-01-11 DIAGNOSIS — S82001D Unspecified fracture of right patella, subsequent encounter for closed fracture with routine healing: Secondary | ICD-10-CM | POA: Diagnosis not present

## 2018-01-11 DIAGNOSIS — M150 Primary generalized (osteo)arthritis: Secondary | ICD-10-CM | POA: Diagnosis not present

## 2018-01-12 ENCOUNTER — Telehealth: Payer: Self-pay

## 2018-01-12 DIAGNOSIS — N183 Chronic kidney disease, stage 3 (moderate): Secondary | ICD-10-CM | POA: Diagnosis not present

## 2018-01-12 DIAGNOSIS — I130 Hypertensive heart and chronic kidney disease with heart failure and stage 1 through stage 4 chronic kidney disease, or unspecified chronic kidney disease: Secondary | ICD-10-CM | POA: Diagnosis not present

## 2018-01-12 DIAGNOSIS — S82101D Unspecified fracture of upper end of right tibia, subsequent encounter for closed fracture with routine healing: Secondary | ICD-10-CM | POA: Diagnosis not present

## 2018-01-12 DIAGNOSIS — M810 Age-related osteoporosis without current pathological fracture: Secondary | ICD-10-CM | POA: Diagnosis not present

## 2018-01-12 DIAGNOSIS — S82421D Displaced transverse fracture of shaft of right fibula, subsequent encounter for closed fracture with routine healing: Secondary | ICD-10-CM | POA: Diagnosis not present

## 2018-01-12 DIAGNOSIS — I2510 Atherosclerotic heart disease of native coronary artery without angina pectoris: Secondary | ICD-10-CM | POA: Diagnosis not present

## 2018-01-12 DIAGNOSIS — E1122 Type 2 diabetes mellitus with diabetic chronic kidney disease: Secondary | ICD-10-CM | POA: Diagnosis not present

## 2018-01-12 DIAGNOSIS — I5032 Chronic diastolic (congestive) heart failure: Secondary | ICD-10-CM | POA: Diagnosis not present

## 2018-01-12 DIAGNOSIS — M150 Primary generalized (osteo)arthritis: Secondary | ICD-10-CM | POA: Diagnosis not present

## 2018-01-12 DIAGNOSIS — E1140 Type 2 diabetes mellitus with diabetic neuropathy, unspecified: Secondary | ICD-10-CM | POA: Diagnosis not present

## 2018-01-12 DIAGNOSIS — S82221D Displaced transverse fracture of shaft of right tibia, subsequent encounter for closed fracture with routine healing: Secondary | ICD-10-CM | POA: Diagnosis not present

## 2018-01-12 DIAGNOSIS — S82001D Unspecified fracture of right patella, subsequent encounter for closed fracture with routine healing: Secondary | ICD-10-CM | POA: Diagnosis not present

## 2018-01-12 DIAGNOSIS — I480 Paroxysmal atrial fibrillation: Secondary | ICD-10-CM | POA: Diagnosis not present

## 2018-01-12 NOTE — Telephone Encounter (Signed)
Briscoe Deutscher, RN at [phone]  Called in requesting approval for continuation of skilled nurse and aide services.  Please contact her back with approval at the above number.

## 2018-01-13 DIAGNOSIS — S82001D Unspecified fracture of right patella, subsequent encounter for closed fracture with routine healing: Secondary | ICD-10-CM | POA: Diagnosis not present

## 2018-01-13 DIAGNOSIS — I480 Paroxysmal atrial fibrillation: Secondary | ICD-10-CM | POA: Diagnosis not present

## 2018-01-13 DIAGNOSIS — M150 Primary generalized (osteo)arthritis: Secondary | ICD-10-CM | POA: Diagnosis not present

## 2018-01-13 DIAGNOSIS — I2510 Atherosclerotic heart disease of native coronary artery without angina pectoris: Secondary | ICD-10-CM | POA: Diagnosis not present

## 2018-01-13 DIAGNOSIS — E1122 Type 2 diabetes mellitus with diabetic chronic kidney disease: Secondary | ICD-10-CM | POA: Diagnosis not present

## 2018-01-13 DIAGNOSIS — I5032 Chronic diastolic (congestive) heart failure: Secondary | ICD-10-CM | POA: Diagnosis not present

## 2018-01-13 DIAGNOSIS — S82221D Displaced transverse fracture of shaft of right tibia, subsequent encounter for closed fracture with routine healing: Secondary | ICD-10-CM | POA: Diagnosis not present

## 2018-01-13 DIAGNOSIS — S82101D Unspecified fracture of upper end of right tibia, subsequent encounter for closed fracture with routine healing: Secondary | ICD-10-CM | POA: Diagnosis not present

## 2018-01-13 DIAGNOSIS — E1140 Type 2 diabetes mellitus with diabetic neuropathy, unspecified: Secondary | ICD-10-CM | POA: Diagnosis not present

## 2018-01-13 DIAGNOSIS — M810 Age-related osteoporosis without current pathological fracture: Secondary | ICD-10-CM | POA: Diagnosis not present

## 2018-01-13 DIAGNOSIS — S82421D Displaced transverse fracture of shaft of right fibula, subsequent encounter for closed fracture with routine healing: Secondary | ICD-10-CM | POA: Diagnosis not present

## 2018-01-13 DIAGNOSIS — I130 Hypertensive heart and chronic kidney disease with heart failure and stage 1 through stage 4 chronic kidney disease, or unspecified chronic kidney disease: Secondary | ICD-10-CM | POA: Diagnosis not present

## 2018-01-13 DIAGNOSIS — N183 Chronic kidney disease, stage 3 (moderate): Secondary | ICD-10-CM | POA: Diagnosis not present

## 2018-01-14 ENCOUNTER — Other Ambulatory Visit: Payer: Self-pay | Admitting: Family Medicine

## 2018-01-16 DIAGNOSIS — M810 Age-related osteoporosis without current pathological fracture: Secondary | ICD-10-CM | POA: Diagnosis not present

## 2018-01-16 DIAGNOSIS — S82221D Displaced transverse fracture of shaft of right tibia, subsequent encounter for closed fracture with routine healing: Secondary | ICD-10-CM | POA: Diagnosis not present

## 2018-01-16 DIAGNOSIS — I130 Hypertensive heart and chronic kidney disease with heart failure and stage 1 through stage 4 chronic kidney disease, or unspecified chronic kidney disease: Secondary | ICD-10-CM | POA: Diagnosis not present

## 2018-01-16 DIAGNOSIS — M150 Primary generalized (osteo)arthritis: Secondary | ICD-10-CM | POA: Diagnosis not present

## 2018-01-16 DIAGNOSIS — I2510 Atherosclerotic heart disease of native coronary artery without angina pectoris: Secondary | ICD-10-CM | POA: Diagnosis not present

## 2018-01-16 DIAGNOSIS — E1140 Type 2 diabetes mellitus with diabetic neuropathy, unspecified: Secondary | ICD-10-CM | POA: Diagnosis not present

## 2018-01-16 DIAGNOSIS — S82101D Unspecified fracture of upper end of right tibia, subsequent encounter for closed fracture with routine healing: Secondary | ICD-10-CM | POA: Diagnosis not present

## 2018-01-16 DIAGNOSIS — I480 Paroxysmal atrial fibrillation: Secondary | ICD-10-CM | POA: Diagnosis not present

## 2018-01-16 DIAGNOSIS — N183 Chronic kidney disease, stage 3 (moderate): Secondary | ICD-10-CM | POA: Diagnosis not present

## 2018-01-16 DIAGNOSIS — S82001D Unspecified fracture of right patella, subsequent encounter for closed fracture with routine healing: Secondary | ICD-10-CM | POA: Diagnosis not present

## 2018-01-16 DIAGNOSIS — I5032 Chronic diastolic (congestive) heart failure: Secondary | ICD-10-CM | POA: Diagnosis not present

## 2018-01-16 DIAGNOSIS — E1122 Type 2 diabetes mellitus with diabetic chronic kidney disease: Secondary | ICD-10-CM | POA: Diagnosis not present

## 2018-01-16 DIAGNOSIS — S82421D Displaced transverse fracture of shaft of right fibula, subsequent encounter for closed fracture with routine healing: Secondary | ICD-10-CM | POA: Diagnosis not present

## 2018-01-17 ENCOUNTER — Ambulatory Visit (INDEPENDENT_AMBULATORY_CARE_PROVIDER_SITE_OTHER): Payer: Medicare Other | Admitting: Pharmacist Clinician (PhC)/ Clinical Pharmacy Specialist

## 2018-01-17 ENCOUNTER — Telehealth: Payer: Self-pay

## 2018-01-17 DIAGNOSIS — E1140 Type 2 diabetes mellitus with diabetic neuropathy, unspecified: Secondary | ICD-10-CM | POA: Diagnosis not present

## 2018-01-17 DIAGNOSIS — S82491D Other fracture of shaft of right fibula, subsequent encounter for closed fracture with routine healing: Secondary | ICD-10-CM | POA: Diagnosis not present

## 2018-01-17 DIAGNOSIS — S72331D Displaced oblique fracture of shaft of right femur, subsequent encounter for closed fracture with routine healing: Secondary | ICD-10-CM | POA: Diagnosis not present

## 2018-01-17 DIAGNOSIS — S82191D Other fracture of upper end of right tibia, subsequent encounter for closed fracture with routine healing: Secondary | ICD-10-CM | POA: Diagnosis not present

## 2018-01-17 DIAGNOSIS — S82231D Displaced oblique fracture of shaft of right tibia, subsequent encounter for closed fracture with routine healing: Secondary | ICD-10-CM | POA: Diagnosis not present

## 2018-01-17 DIAGNOSIS — S82001D Unspecified fracture of right patella, subsequent encounter for closed fracture with routine healing: Secondary | ICD-10-CM | POA: Diagnosis not present

## 2018-01-17 DIAGNOSIS — I480 Paroxysmal atrial fibrillation: Secondary | ICD-10-CM | POA: Diagnosis not present

## 2018-01-17 DIAGNOSIS — M533 Sacrococcygeal disorders, not elsewhere classified: Secondary | ICD-10-CM | POA: Diagnosis not present

## 2018-01-17 DIAGNOSIS — N183 Chronic kidney disease, stage 3 (moderate): Secondary | ICD-10-CM | POA: Diagnosis not present

## 2018-01-17 DIAGNOSIS — S82421D Displaced transverse fracture of shaft of right fibula, subsequent encounter for closed fracture with routine healing: Secondary | ICD-10-CM | POA: Diagnosis not present

## 2018-01-17 DIAGNOSIS — M85851 Other specified disorders of bone density and structure, right thigh: Secondary | ICD-10-CM | POA: Diagnosis not present

## 2018-01-17 DIAGNOSIS — S82101D Unspecified fracture of upper end of right tibia, subsequent encounter for closed fracture with routine healing: Secondary | ICD-10-CM | POA: Diagnosis not present

## 2018-01-17 DIAGNOSIS — I130 Hypertensive heart and chronic kidney disease with heart failure and stage 1 through stage 4 chronic kidney disease, or unspecified chronic kidney disease: Secondary | ICD-10-CM | POA: Diagnosis not present

## 2018-01-17 DIAGNOSIS — I2510 Atherosclerotic heart disease of native coronary artery without angina pectoris: Secondary | ICD-10-CM | POA: Diagnosis not present

## 2018-01-17 DIAGNOSIS — S82031D Displaced transverse fracture of right patella, subsequent encounter for closed fracture with routine healing: Secondary | ICD-10-CM | POA: Diagnosis not present

## 2018-01-17 DIAGNOSIS — E1122 Type 2 diabetes mellitus with diabetic chronic kidney disease: Secondary | ICD-10-CM | POA: Diagnosis not present

## 2018-01-17 DIAGNOSIS — M150 Primary generalized (osteo)arthritis: Secondary | ICD-10-CM | POA: Diagnosis not present

## 2018-01-17 DIAGNOSIS — Z7901 Long term (current) use of anticoagulants: Secondary | ICD-10-CM

## 2018-01-17 DIAGNOSIS — M810 Age-related osteoporosis without current pathological fracture: Secondary | ICD-10-CM | POA: Diagnosis not present

## 2018-01-17 DIAGNOSIS — I5032 Chronic diastolic (congestive) heart failure: Secondary | ICD-10-CM | POA: Diagnosis not present

## 2018-01-17 DIAGNOSIS — S72491D Other fracture of lower end of right femur, subsequent encounter for closed fracture with routine healing: Secondary | ICD-10-CM | POA: Diagnosis not present

## 2018-01-17 DIAGNOSIS — S82221D Displaced transverse fracture of shaft of right tibia, subsequent encounter for closed fracture with routine healing: Secondary | ICD-10-CM | POA: Diagnosis not present

## 2018-01-17 DIAGNOSIS — Z9889 Other specified postprocedural states: Secondary | ICD-10-CM | POA: Diagnosis not present

## 2018-01-17 DIAGNOSIS — S82831D Other fracture of upper and lower end of right fibula, subsequent encounter for closed fracture with routine healing: Secondary | ICD-10-CM | POA: Diagnosis not present

## 2018-01-17 LAB — POCT INR: INR: 1.9

## 2018-01-17 NOTE — Telephone Encounter (Signed)
K to give verbal approval for additional home health orders.

## 2018-01-17 NOTE — Telephone Encounter (Signed)
[name]  - [phone] - calling in requesting a verbal approval for re certification orders for approval for Nursing and Aide Services for the following:  1 time a week for 1 week 2 times a week for 8 weeks for Aide 1 time a week for 9 weeks for Nursing

## 2018-01-18 DIAGNOSIS — I5032 Chronic diastolic (congestive) heart failure: Secondary | ICD-10-CM | POA: Diagnosis not present

## 2018-01-18 DIAGNOSIS — M810 Age-related osteoporosis without current pathological fracture: Secondary | ICD-10-CM | POA: Diagnosis not present

## 2018-01-18 DIAGNOSIS — N183 Chronic kidney disease, stage 3 (moderate): Secondary | ICD-10-CM | POA: Diagnosis not present

## 2018-01-18 DIAGNOSIS — I130 Hypertensive heart and chronic kidney disease with heart failure and stage 1 through stage 4 chronic kidney disease, or unspecified chronic kidney disease: Secondary | ICD-10-CM | POA: Diagnosis not present

## 2018-01-18 DIAGNOSIS — S82421D Displaced transverse fracture of shaft of right fibula, subsequent encounter for closed fracture with routine healing: Secondary | ICD-10-CM | POA: Diagnosis not present

## 2018-01-18 DIAGNOSIS — I2510 Atherosclerotic heart disease of native coronary artery without angina pectoris: Secondary | ICD-10-CM | POA: Diagnosis not present

## 2018-01-18 DIAGNOSIS — S82101D Unspecified fracture of upper end of right tibia, subsequent encounter for closed fracture with routine healing: Secondary | ICD-10-CM | POA: Diagnosis not present

## 2018-01-18 DIAGNOSIS — S82221D Displaced transverse fracture of shaft of right tibia, subsequent encounter for closed fracture with routine healing: Secondary | ICD-10-CM | POA: Diagnosis not present

## 2018-01-18 DIAGNOSIS — S82001D Unspecified fracture of right patella, subsequent encounter for closed fracture with routine healing: Secondary | ICD-10-CM | POA: Diagnosis not present

## 2018-01-18 DIAGNOSIS — E1140 Type 2 diabetes mellitus with diabetic neuropathy, unspecified: Secondary | ICD-10-CM | POA: Diagnosis not present

## 2018-01-18 DIAGNOSIS — M150 Primary generalized (osteo)arthritis: Secondary | ICD-10-CM | POA: Diagnosis not present

## 2018-01-18 DIAGNOSIS — E1122 Type 2 diabetes mellitus with diabetic chronic kidney disease: Secondary | ICD-10-CM | POA: Diagnosis not present

## 2018-01-18 DIAGNOSIS — I480 Paroxysmal atrial fibrillation: Secondary | ICD-10-CM | POA: Diagnosis not present

## 2018-01-18 NOTE — Telephone Encounter (Signed)
Called [facility], [facility] - gave verbal approval for re certification orders.

## 2018-01-19 DIAGNOSIS — I5032 Chronic diastolic (congestive) heart failure: Secondary | ICD-10-CM | POA: Diagnosis not present

## 2018-01-19 DIAGNOSIS — M810 Age-related osteoporosis without current pathological fracture: Secondary | ICD-10-CM | POA: Diagnosis not present

## 2018-01-19 DIAGNOSIS — S82101D Unspecified fracture of upper end of right tibia, subsequent encounter for closed fracture with routine healing: Secondary | ICD-10-CM | POA: Diagnosis not present

## 2018-01-19 DIAGNOSIS — I2510 Atherosclerotic heart disease of native coronary artery without angina pectoris: Secondary | ICD-10-CM | POA: Diagnosis not present

## 2018-01-19 DIAGNOSIS — I480 Paroxysmal atrial fibrillation: Secondary | ICD-10-CM | POA: Diagnosis not present

## 2018-01-19 DIAGNOSIS — M150 Primary generalized (osteo)arthritis: Secondary | ICD-10-CM | POA: Diagnosis not present

## 2018-01-19 DIAGNOSIS — S82421D Displaced transverse fracture of shaft of right fibula, subsequent encounter for closed fracture with routine healing: Secondary | ICD-10-CM | POA: Diagnosis not present

## 2018-01-19 DIAGNOSIS — S82221D Displaced transverse fracture of shaft of right tibia, subsequent encounter for closed fracture with routine healing: Secondary | ICD-10-CM | POA: Diagnosis not present

## 2018-01-19 DIAGNOSIS — E1122 Type 2 diabetes mellitus with diabetic chronic kidney disease: Secondary | ICD-10-CM | POA: Diagnosis not present

## 2018-01-19 DIAGNOSIS — S82001D Unspecified fracture of right patella, subsequent encounter for closed fracture with routine healing: Secondary | ICD-10-CM | POA: Diagnosis not present

## 2018-01-19 DIAGNOSIS — N183 Chronic kidney disease, stage 3 (moderate): Secondary | ICD-10-CM | POA: Diagnosis not present

## 2018-01-19 DIAGNOSIS — E1140 Type 2 diabetes mellitus with diabetic neuropathy, unspecified: Secondary | ICD-10-CM | POA: Diagnosis not present

## 2018-01-19 DIAGNOSIS — I130 Hypertensive heart and chronic kidney disease with heart failure and stage 1 through stage 4 chronic kidney disease, or unspecified chronic kidney disease: Secondary | ICD-10-CM | POA: Diagnosis not present

## 2018-01-20 DIAGNOSIS — S82001D Unspecified fracture of right patella, subsequent encounter for closed fracture with routine healing: Secondary | ICD-10-CM | POA: Diagnosis not present

## 2018-01-20 DIAGNOSIS — E1140 Type 2 diabetes mellitus with diabetic neuropathy, unspecified: Secondary | ICD-10-CM | POA: Diagnosis not present

## 2018-01-20 DIAGNOSIS — I130 Hypertensive heart and chronic kidney disease with heart failure and stage 1 through stage 4 chronic kidney disease, or unspecified chronic kidney disease: Secondary | ICD-10-CM | POA: Diagnosis not present

## 2018-01-20 DIAGNOSIS — S82421D Displaced transverse fracture of shaft of right fibula, subsequent encounter for closed fracture with routine healing: Secondary | ICD-10-CM | POA: Diagnosis not present

## 2018-01-20 DIAGNOSIS — M150 Primary generalized (osteo)arthritis: Secondary | ICD-10-CM | POA: Diagnosis not present

## 2018-01-20 DIAGNOSIS — I2510 Atherosclerotic heart disease of native coronary artery without angina pectoris: Secondary | ICD-10-CM | POA: Diagnosis not present

## 2018-01-20 DIAGNOSIS — I480 Paroxysmal atrial fibrillation: Secondary | ICD-10-CM | POA: Diagnosis not present

## 2018-01-20 DIAGNOSIS — N183 Chronic kidney disease, stage 3 (moderate): Secondary | ICD-10-CM | POA: Diagnosis not present

## 2018-01-20 DIAGNOSIS — S82101D Unspecified fracture of upper end of right tibia, subsequent encounter for closed fracture with routine healing: Secondary | ICD-10-CM | POA: Diagnosis not present

## 2018-01-20 DIAGNOSIS — I5032 Chronic diastolic (congestive) heart failure: Secondary | ICD-10-CM | POA: Diagnosis not present

## 2018-01-20 DIAGNOSIS — E1122 Type 2 diabetes mellitus with diabetic chronic kidney disease: Secondary | ICD-10-CM | POA: Diagnosis not present

## 2018-01-20 DIAGNOSIS — S82221D Displaced transverse fracture of shaft of right tibia, subsequent encounter for closed fracture with routine healing: Secondary | ICD-10-CM | POA: Diagnosis not present

## 2018-01-20 DIAGNOSIS — M810 Age-related osteoporosis without current pathological fracture: Secondary | ICD-10-CM | POA: Diagnosis not present

## 2018-01-23 ENCOUNTER — Telehealth: Payer: Self-pay | Admitting: Family Medicine

## 2018-01-23 DIAGNOSIS — S82221D Displaced transverse fracture of shaft of right tibia, subsequent encounter for closed fracture with routine healing: Secondary | ICD-10-CM | POA: Diagnosis not present

## 2018-01-23 DIAGNOSIS — E1122 Type 2 diabetes mellitus with diabetic chronic kidney disease: Secondary | ICD-10-CM | POA: Diagnosis not present

## 2018-01-23 DIAGNOSIS — I5032 Chronic diastolic (congestive) heart failure: Secondary | ICD-10-CM | POA: Diagnosis not present

## 2018-01-23 DIAGNOSIS — I480 Paroxysmal atrial fibrillation: Secondary | ICD-10-CM | POA: Diagnosis not present

## 2018-01-23 DIAGNOSIS — S82101D Unspecified fracture of upper end of right tibia, subsequent encounter for closed fracture with routine healing: Secondary | ICD-10-CM | POA: Diagnosis not present

## 2018-01-23 DIAGNOSIS — I2510 Atherosclerotic heart disease of native coronary artery without angina pectoris: Secondary | ICD-10-CM | POA: Diagnosis not present

## 2018-01-23 DIAGNOSIS — M810 Age-related osteoporosis without current pathological fracture: Secondary | ICD-10-CM | POA: Diagnosis not present

## 2018-01-23 DIAGNOSIS — S82001D Unspecified fracture of right patella, subsequent encounter for closed fracture with routine healing: Secondary | ICD-10-CM | POA: Diagnosis not present

## 2018-01-23 DIAGNOSIS — I130 Hypertensive heart and chronic kidney disease with heart failure and stage 1 through stage 4 chronic kidney disease, or unspecified chronic kidney disease: Secondary | ICD-10-CM | POA: Diagnosis not present

## 2018-01-23 DIAGNOSIS — E1140 Type 2 diabetes mellitus with diabetic neuropathy, unspecified: Secondary | ICD-10-CM | POA: Diagnosis not present

## 2018-01-23 DIAGNOSIS — S82421D Displaced transverse fracture of shaft of right fibula, subsequent encounter for closed fracture with routine healing: Secondary | ICD-10-CM | POA: Diagnosis not present

## 2018-01-23 DIAGNOSIS — M150 Primary generalized (osteo)arthritis: Secondary | ICD-10-CM | POA: Diagnosis not present

## 2018-01-23 DIAGNOSIS — N183 Chronic kidney disease, stage 3 (moderate): Secondary | ICD-10-CM | POA: Diagnosis not present

## 2018-01-23 NOTE — Telephone Encounter (Signed)
Pt daughter called and states Idalia legs have got some swelling in them and overnight had oozing of fluid from extremities. States no redness, no warm to touch or hot.  Has appt on Wednesday with Dr. Madilyn Fireman.  Advised daughter that I thought she would be ok until appointment as long as stayed the same and no worse as in redness or heat.  Advised to elevate legs while sitting and when lying down.  Daughter verbalized understanding. KG LPN

## 2018-01-24 ENCOUNTER — Ambulatory Visit (INDEPENDENT_AMBULATORY_CARE_PROVIDER_SITE_OTHER): Payer: Medicare Other | Admitting: Pharmacist

## 2018-01-24 DIAGNOSIS — S82101D Unspecified fracture of upper end of right tibia, subsequent encounter for closed fracture with routine healing: Secondary | ICD-10-CM | POA: Diagnosis not present

## 2018-01-24 DIAGNOSIS — I480 Paroxysmal atrial fibrillation: Secondary | ICD-10-CM | POA: Diagnosis not present

## 2018-01-24 DIAGNOSIS — E1122 Type 2 diabetes mellitus with diabetic chronic kidney disease: Secondary | ICD-10-CM | POA: Diagnosis not present

## 2018-01-24 DIAGNOSIS — E1140 Type 2 diabetes mellitus with diabetic neuropathy, unspecified: Secondary | ICD-10-CM | POA: Diagnosis not present

## 2018-01-24 DIAGNOSIS — I5032 Chronic diastolic (congestive) heart failure: Secondary | ICD-10-CM | POA: Diagnosis not present

## 2018-01-24 DIAGNOSIS — I2510 Atherosclerotic heart disease of native coronary artery without angina pectoris: Secondary | ICD-10-CM | POA: Diagnosis not present

## 2018-01-24 DIAGNOSIS — Z7901 Long term (current) use of anticoagulants: Secondary | ICD-10-CM

## 2018-01-24 DIAGNOSIS — I130 Hypertensive heart and chronic kidney disease with heart failure and stage 1 through stage 4 chronic kidney disease, or unspecified chronic kidney disease: Secondary | ICD-10-CM | POA: Diagnosis not present

## 2018-01-24 DIAGNOSIS — N183 Chronic kidney disease, stage 3 (moderate): Secondary | ICD-10-CM | POA: Diagnosis not present

## 2018-01-24 DIAGNOSIS — M150 Primary generalized (osteo)arthritis: Secondary | ICD-10-CM | POA: Diagnosis not present

## 2018-01-24 DIAGNOSIS — S82221D Displaced transverse fracture of shaft of right tibia, subsequent encounter for closed fracture with routine healing: Secondary | ICD-10-CM | POA: Diagnosis not present

## 2018-01-24 DIAGNOSIS — M810 Age-related osteoporosis without current pathological fracture: Secondary | ICD-10-CM | POA: Diagnosis not present

## 2018-01-24 DIAGNOSIS — S82001D Unspecified fracture of right patella, subsequent encounter for closed fracture with routine healing: Secondary | ICD-10-CM | POA: Diagnosis not present

## 2018-01-24 DIAGNOSIS — S82421D Displaced transverse fracture of shaft of right fibula, subsequent encounter for closed fracture with routine healing: Secondary | ICD-10-CM | POA: Diagnosis not present

## 2018-01-24 LAB — POCT INR: INR: 2.5

## 2018-01-25 ENCOUNTER — Ambulatory Visit (INDEPENDENT_AMBULATORY_CARE_PROVIDER_SITE_OTHER): Payer: Medicare Other | Admitting: Family Medicine

## 2018-01-25 ENCOUNTER — Encounter: Payer: Self-pay | Admitting: Family Medicine

## 2018-01-25 VITALS — BP 145/54 | HR 72

## 2018-01-25 DIAGNOSIS — F0280 Dementia in other diseases classified elsewhere without behavioral disturbance: Secondary | ICD-10-CM | POA: Diagnosis not present

## 2018-01-25 DIAGNOSIS — I5032 Chronic diastolic (congestive) heart failure: Secondary | ICD-10-CM

## 2018-01-25 DIAGNOSIS — Z794 Long term (current) use of insulin: Secondary | ICD-10-CM | POA: Diagnosis not present

## 2018-01-25 DIAGNOSIS — R600 Localized edema: Secondary | ICD-10-CM

## 2018-01-25 DIAGNOSIS — G301 Alzheimer's disease with late onset: Secondary | ICD-10-CM

## 2018-01-25 DIAGNOSIS — S81811A Laceration without foreign body, right lower leg, initial encounter: Secondary | ICD-10-CM | POA: Diagnosis not present

## 2018-01-25 DIAGNOSIS — E1143 Type 2 diabetes mellitus with diabetic autonomic (poly)neuropathy: Secondary | ICD-10-CM | POA: Diagnosis not present

## 2018-01-25 DIAGNOSIS — F0390 Unspecified dementia without behavioral disturbance: Secondary | ICD-10-CM | POA: Insufficient documentation

## 2018-01-25 DIAGNOSIS — R0602 Shortness of breath: Secondary | ICD-10-CM | POA: Diagnosis not present

## 2018-01-25 LAB — POCT GLYCOSYLATED HEMOGLOBIN (HGB A1C): Hemoglobin A1C: 7.1

## 2018-01-25 MED ORDER — DONEPEZIL HCL 5 MG PO TABS: 5.0000 mg | ORAL_TABLET | Freq: Every day | ORAL | 0 refills | Status: DC

## 2018-01-25 MED ORDER — FUROSEMIDE 40 MG PO TABS: 40.0000 mg | ORAL_TABLET | Freq: Every day | ORAL | 0 refills | Status: DC | PRN

## 2018-01-25 MED ORDER — WARFARIN SODIUM 2 MG PO TABS: ORAL_TABLET | ORAL | 1 refills | Status: DC

## 2018-01-25 MED ORDER — WARFARIN SODIUM 3 MG PO TABS: ORAL_TABLET | ORAL | 1 refills | Status: DC

## 2018-01-25 NOTE — Addendum Note (Signed)
Addended by: Teddy Spike on: 01/25/2018 01:12 PM   Modules accepted: Orders

## 2018-01-25 NOTE — Patient Instructions (Signed)
Start lasix today.  Take one a day and call us on Friday with your daily weight and let us know how you feel.

## 2018-01-25 NOTE — Progress Notes (Signed)
Subjective:    CC: DM  HPI: Diabetes - no hypoglycemic events. No wounds or sores that are not healing well. No increased thirst or urination. Checking glucose at home. Taking medications as prescribed without any side effects.  Has some wounds on her legs from where her dogs paw has scratched her. . Has some clear drainage but not pus or odor.  No fever, chills or sweats.  She also has a lot of swelling in that knee and legs.  In fact this week she reports she really has not been feeling well overall.  Her daughter notes that she is been more shaky and she is also been more short of breath.  When the physical therapist is come to work with her she has had to sit and rest multiple times which she was not having to do last week.  She also reports that her right leg feels extremely heavy and it has been hard to move it.  She does feel like she is had more fluid and swelling in her legs over this last few days.  She denies any chest pain.  She has a chronic mild cough but it has not changed in nature and she has a chronic runny nose which has not changed as well.  No fevers chills or sweats or other upper respiratory symptoms.  Her daughter who is here with her today has had some upper is actually starting to feel little better.  She also went to discuss recent worsening of her memory.  She is been on Namenda for quite some time.  I am not sure why she was never tried on Aricept..  She says sometimes she even forgets her daughter's name.  She knows that she is her daughter but just cannot recall her name at times.  She forgets conversations that she is had earlier in the day.  And will for even forget TV shows that she is just watched and think it is a brand-new show.  Her daughter says it is quite significant.  Past medical history, Surgical history, Family history not pertinant except as noted below, Social history, Allergies, and medications have been entered into the medical record, reviewed, and  corrections made.   Review of Systems: No fevers, chills, night sweats, or weight loss.  + wheezing esp at night for 2 months.    Objective:    General: Well Developed, well nourished, and in no acute distress.  Neuro: Alert and oriented x3, extra-ocular muscles intact, sensation grossly intact.  HEENT: Normocephalic, atraumatic  Skin: Warm and dry, no rashes. Cardiac: Regular rate and rhythm, 2+ systolic heart murmur, harsh.  1+ pitting lower ext edema and edema around her right knee.   Respiratory: Clear to auscultation bilaterally. Not using accessory muscles, speaking in full sentences.   Impression and Recommendations:    DM -hemoglobin A1c is mildly elevated to 7.1.  But I think because she does have a tendency to hypoglycemia that is reasonable to keep her at this A1c.  I think this is perfectly fine.  Memory Impairment -we will go ahead and start Aricept 5 mg daily.  If she does well on this then in 1 month we can increase her to 10 mg daily.  Plan for MMSE when she returns.   Increase shortness of breath and lower extremity edema-we will put her on Lasix for couple days off.  I want her to start measuring her weight daily starting today and then call me on Friday and see  if she is feeling better.  We will go ahead and check her renal function, electrolytes and CBC to rule out infection.  Consider other causes such as infection, pneumonia, heart failure ( she has known diastolic heart failure) etc.  At this point I do not see any red flag symptoms.  Her pulse ox is 93% and she is afebrile.  I think it is reasonable to try diuresis for the next 2 days and see if she improves.  If at any point she develops any new symptoms such as change in cough, wheezing, fever etc. she is to call us immediately and we will get a chest x-ray.  Also if her symptoms have not improved by Friday I would like to go ahead and get a chest x-ray as well.  Laceration on right outer leg-the wound actually itself  looks good.  It looks like it is trying to heal well.  There is no significant erythema or induration around the wound.  No drainage with compression.  Apply Vaseline and gauze.  She can even do a mild compression wrap to help with the swelling which will improve hip wound healing as well.  Keep a close eye on it and check the wound daily for any signs of infection.  Time spent 40 min, > 50% spent counseling about shortness of breath, lower extremity edema, laceration and memory loss.

## 2018-01-26 DIAGNOSIS — E1122 Type 2 diabetes mellitus with diabetic chronic kidney disease: Secondary | ICD-10-CM | POA: Diagnosis not present

## 2018-01-26 DIAGNOSIS — N183 Chronic kidney disease, stage 3 (moderate): Secondary | ICD-10-CM | POA: Diagnosis not present

## 2018-01-26 DIAGNOSIS — I2510 Atherosclerotic heart disease of native coronary artery without angina pectoris: Secondary | ICD-10-CM | POA: Diagnosis not present

## 2018-01-26 DIAGNOSIS — S82001D Unspecified fracture of right patella, subsequent encounter for closed fracture with routine healing: Secondary | ICD-10-CM | POA: Diagnosis not present

## 2018-01-26 DIAGNOSIS — I480 Paroxysmal atrial fibrillation: Secondary | ICD-10-CM | POA: Diagnosis not present

## 2018-01-26 DIAGNOSIS — I130 Hypertensive heart and chronic kidney disease with heart failure and stage 1 through stage 4 chronic kidney disease, or unspecified chronic kidney disease: Secondary | ICD-10-CM | POA: Diagnosis not present

## 2018-01-26 DIAGNOSIS — I5032 Chronic diastolic (congestive) heart failure: Secondary | ICD-10-CM | POA: Diagnosis not present

## 2018-01-26 DIAGNOSIS — E1140 Type 2 diabetes mellitus with diabetic neuropathy, unspecified: Secondary | ICD-10-CM | POA: Diagnosis not present

## 2018-01-26 DIAGNOSIS — S82101D Unspecified fracture of upper end of right tibia, subsequent encounter for closed fracture with routine healing: Secondary | ICD-10-CM | POA: Diagnosis not present

## 2018-01-26 DIAGNOSIS — S82221D Displaced transverse fracture of shaft of right tibia, subsequent encounter for closed fracture with routine healing: Secondary | ICD-10-CM | POA: Diagnosis not present

## 2018-01-26 DIAGNOSIS — S82421D Displaced transverse fracture of shaft of right fibula, subsequent encounter for closed fracture with routine healing: Secondary | ICD-10-CM | POA: Diagnosis not present

## 2018-01-26 DIAGNOSIS — M150 Primary generalized (osteo)arthritis: Secondary | ICD-10-CM | POA: Diagnosis not present

## 2018-01-26 DIAGNOSIS — M810 Age-related osteoporosis without current pathological fracture: Secondary | ICD-10-CM | POA: Diagnosis not present

## 2018-01-26 LAB — CBC WITH DIFFERENTIAL/PLATELET
BASOS ABS: 133 {cells}/uL (ref 0–200)
BASOS PCT: 1.9 %
EOS ABS: 189 {cells}/uL (ref 15–500)
Eosinophils Relative: 2.7 %
HCT: 35.3 % (ref 35.0–45.0)
Hemoglobin: 11.7 g/dL (ref 11.7–15.5)
Lymphs Abs: 868 cells/uL (ref 850–3900)
MCH: 30.8 pg (ref 27.0–33.0)
MCHC: 33.1 g/dL (ref 32.0–36.0)
MCV: 92.9 fL (ref 80.0–100.0)
MONOS PCT: 7.6 %
MPV: 9.8 fL (ref 7.5–12.5)
Neutro Abs: 5278 cells/uL (ref 1500–7800)
Neutrophils Relative %: 75.4 %
PLATELETS: 301 10*3/uL (ref 140–400)
RBC: 3.8 10*6/uL (ref 3.80–5.10)
RDW: 13 % (ref 11.0–15.0)
TOTAL LYMPHOCYTE: 12.4 %
WBC mixed population: 532 cells/uL (ref 200–950)
WBC: 7 10*3/uL (ref 3.8–10.8)

## 2018-01-26 LAB — BASIC METABOLIC PANEL WITH GFR

## 2018-01-27 ENCOUNTER — Telehealth: Payer: Self-pay

## 2018-01-27 NOTE — Telephone Encounter (Signed)
[name] advised, verbalized understanding.

## 2018-01-27 NOTE — Telephone Encounter (Signed)
Awesome. Then continue the diuretic through the weekend and then call on Monday.

## 2018-01-27 NOTE — Telephone Encounter (Signed)
Olanna's daughter called and states she is doing much better today. The shaking and shortness of breath has improved.

## 2018-01-30 DIAGNOSIS — S82221D Displaced transverse fracture of shaft of right tibia, subsequent encounter for closed fracture with routine healing: Secondary | ICD-10-CM | POA: Diagnosis not present

## 2018-01-30 DIAGNOSIS — M150 Primary generalized (osteo)arthritis: Secondary | ICD-10-CM | POA: Diagnosis not present

## 2018-01-30 DIAGNOSIS — E1140 Type 2 diabetes mellitus with diabetic neuropathy, unspecified: Secondary | ICD-10-CM | POA: Diagnosis not present

## 2018-01-30 DIAGNOSIS — S82101D Unspecified fracture of upper end of right tibia, subsequent encounter for closed fracture with routine healing: Secondary | ICD-10-CM | POA: Diagnosis not present

## 2018-01-30 DIAGNOSIS — I2510 Atherosclerotic heart disease of native coronary artery without angina pectoris: Secondary | ICD-10-CM | POA: Diagnosis not present

## 2018-01-30 DIAGNOSIS — I130 Hypertensive heart and chronic kidney disease with heart failure and stage 1 through stage 4 chronic kidney disease, or unspecified chronic kidney disease: Secondary | ICD-10-CM | POA: Diagnosis not present

## 2018-01-30 DIAGNOSIS — I480 Paroxysmal atrial fibrillation: Secondary | ICD-10-CM | POA: Diagnosis not present

## 2018-01-30 DIAGNOSIS — S82421D Displaced transverse fracture of shaft of right fibula, subsequent encounter for closed fracture with routine healing: Secondary | ICD-10-CM | POA: Diagnosis not present

## 2018-01-30 DIAGNOSIS — I5032 Chronic diastolic (congestive) heart failure: Secondary | ICD-10-CM | POA: Diagnosis not present

## 2018-01-30 DIAGNOSIS — E1122 Type 2 diabetes mellitus with diabetic chronic kidney disease: Secondary | ICD-10-CM | POA: Diagnosis not present

## 2018-01-30 DIAGNOSIS — S82001D Unspecified fracture of right patella, subsequent encounter for closed fracture with routine healing: Secondary | ICD-10-CM | POA: Diagnosis not present

## 2018-01-30 DIAGNOSIS — M810 Age-related osteoporosis without current pathological fracture: Secondary | ICD-10-CM | POA: Diagnosis not present

## 2018-01-30 DIAGNOSIS — N183 Chronic kidney disease, stage 3 (moderate): Secondary | ICD-10-CM | POA: Diagnosis not present

## 2018-01-31 ENCOUNTER — Ambulatory Visit (INDEPENDENT_AMBULATORY_CARE_PROVIDER_SITE_OTHER): Payer: Medicare Other | Admitting: Pharmacist

## 2018-01-31 DIAGNOSIS — S82421D Displaced transverse fracture of shaft of right fibula, subsequent encounter for closed fracture with routine healing: Secondary | ICD-10-CM | POA: Diagnosis not present

## 2018-01-31 DIAGNOSIS — I2510 Atherosclerotic heart disease of native coronary artery without angina pectoris: Secondary | ICD-10-CM | POA: Diagnosis not present

## 2018-01-31 DIAGNOSIS — I130 Hypertensive heart and chronic kidney disease with heart failure and stage 1 through stage 4 chronic kidney disease, or unspecified chronic kidney disease: Secondary | ICD-10-CM | POA: Diagnosis not present

## 2018-01-31 DIAGNOSIS — Z7901 Long term (current) use of anticoagulants: Secondary | ICD-10-CM | POA: Diagnosis not present

## 2018-01-31 DIAGNOSIS — S82221D Displaced transverse fracture of shaft of right tibia, subsequent encounter for closed fracture with routine healing: Secondary | ICD-10-CM | POA: Diagnosis not present

## 2018-01-31 DIAGNOSIS — N183 Chronic kidney disease, stage 3 (moderate): Secondary | ICD-10-CM | POA: Diagnosis not present

## 2018-01-31 DIAGNOSIS — I480 Paroxysmal atrial fibrillation: Secondary | ICD-10-CM | POA: Diagnosis not present

## 2018-01-31 DIAGNOSIS — M810 Age-related osteoporosis without current pathological fracture: Secondary | ICD-10-CM | POA: Diagnosis not present

## 2018-01-31 DIAGNOSIS — M150 Primary generalized (osteo)arthritis: Secondary | ICD-10-CM | POA: Diagnosis not present

## 2018-01-31 DIAGNOSIS — I5032 Chronic diastolic (congestive) heart failure: Secondary | ICD-10-CM | POA: Diagnosis not present

## 2018-01-31 DIAGNOSIS — S82101D Unspecified fracture of upper end of right tibia, subsequent encounter for closed fracture with routine healing: Secondary | ICD-10-CM | POA: Diagnosis not present

## 2018-01-31 DIAGNOSIS — E1122 Type 2 diabetes mellitus with diabetic chronic kidney disease: Secondary | ICD-10-CM | POA: Diagnosis not present

## 2018-01-31 DIAGNOSIS — E1140 Type 2 diabetes mellitus with diabetic neuropathy, unspecified: Secondary | ICD-10-CM | POA: Diagnosis not present

## 2018-01-31 DIAGNOSIS — S82001D Unspecified fracture of right patella, subsequent encounter for closed fracture with routine healing: Secondary | ICD-10-CM | POA: Diagnosis not present

## 2018-01-31 LAB — POCT INR: INR: 2.2

## 2018-02-01 DIAGNOSIS — S82421D Displaced transverse fracture of shaft of right fibula, subsequent encounter for closed fracture with routine healing: Secondary | ICD-10-CM | POA: Diagnosis not present

## 2018-02-01 DIAGNOSIS — S82101D Unspecified fracture of upper end of right tibia, subsequent encounter for closed fracture with routine healing: Secondary | ICD-10-CM | POA: Diagnosis not present

## 2018-02-01 DIAGNOSIS — I480 Paroxysmal atrial fibrillation: Secondary | ICD-10-CM | POA: Diagnosis not present

## 2018-02-01 DIAGNOSIS — M150 Primary generalized (osteo)arthritis: Secondary | ICD-10-CM | POA: Diagnosis not present

## 2018-02-01 DIAGNOSIS — S82001D Unspecified fracture of right patella, subsequent encounter for closed fracture with routine healing: Secondary | ICD-10-CM | POA: Diagnosis not present

## 2018-02-01 DIAGNOSIS — S82221D Displaced transverse fracture of shaft of right tibia, subsequent encounter for closed fracture with routine healing: Secondary | ICD-10-CM | POA: Diagnosis not present

## 2018-02-01 DIAGNOSIS — G8929 Other chronic pain: Secondary | ICD-10-CM | POA: Diagnosis not present

## 2018-02-01 DIAGNOSIS — I130 Hypertensive heart and chronic kidney disease with heart failure and stage 1 through stage 4 chronic kidney disease, or unspecified chronic kidney disease: Secondary | ICD-10-CM | POA: Diagnosis not present

## 2018-02-01 DIAGNOSIS — N183 Chronic kidney disease, stage 3 (moderate): Secondary | ICD-10-CM | POA: Diagnosis not present

## 2018-02-01 DIAGNOSIS — I5032 Chronic diastolic (congestive) heart failure: Secondary | ICD-10-CM | POA: Diagnosis not present

## 2018-02-01 DIAGNOSIS — E1122 Type 2 diabetes mellitus with diabetic chronic kidney disease: Secondary | ICD-10-CM | POA: Diagnosis not present

## 2018-02-01 DIAGNOSIS — I2510 Atherosclerotic heart disease of native coronary artery without angina pectoris: Secondary | ICD-10-CM | POA: Diagnosis not present

## 2018-02-01 DIAGNOSIS — M810 Age-related osteoporosis without current pathological fracture: Secondary | ICD-10-CM | POA: Diagnosis not present

## 2018-02-01 DIAGNOSIS — E1140 Type 2 diabetes mellitus with diabetic neuropathy, unspecified: Secondary | ICD-10-CM | POA: Diagnosis not present

## 2018-02-02 DIAGNOSIS — I5032 Chronic diastolic (congestive) heart failure: Secondary | ICD-10-CM | POA: Diagnosis not present

## 2018-02-02 DIAGNOSIS — S82101D Unspecified fracture of upper end of right tibia, subsequent encounter for closed fracture with routine healing: Secondary | ICD-10-CM | POA: Diagnosis not present

## 2018-02-02 DIAGNOSIS — I480 Paroxysmal atrial fibrillation: Secondary | ICD-10-CM | POA: Diagnosis not present

## 2018-02-02 DIAGNOSIS — S82221D Displaced transverse fracture of shaft of right tibia, subsequent encounter for closed fracture with routine healing: Secondary | ICD-10-CM | POA: Diagnosis not present

## 2018-02-02 DIAGNOSIS — S82421D Displaced transverse fracture of shaft of right fibula, subsequent encounter for closed fracture with routine healing: Secondary | ICD-10-CM | POA: Diagnosis not present

## 2018-02-02 DIAGNOSIS — N183 Chronic kidney disease, stage 3 (moderate): Secondary | ICD-10-CM | POA: Diagnosis not present

## 2018-02-02 DIAGNOSIS — M150 Primary generalized (osteo)arthritis: Secondary | ICD-10-CM | POA: Diagnosis not present

## 2018-02-02 DIAGNOSIS — E1122 Type 2 diabetes mellitus with diabetic chronic kidney disease: Secondary | ICD-10-CM | POA: Diagnosis not present

## 2018-02-02 DIAGNOSIS — M810 Age-related osteoporosis without current pathological fracture: Secondary | ICD-10-CM | POA: Diagnosis not present

## 2018-02-02 DIAGNOSIS — S82001D Unspecified fracture of right patella, subsequent encounter for closed fracture with routine healing: Secondary | ICD-10-CM | POA: Diagnosis not present

## 2018-02-02 DIAGNOSIS — E1140 Type 2 diabetes mellitus with diabetic neuropathy, unspecified: Secondary | ICD-10-CM | POA: Diagnosis not present

## 2018-02-02 DIAGNOSIS — I2510 Atherosclerotic heart disease of native coronary artery without angina pectoris: Secondary | ICD-10-CM | POA: Diagnosis not present

## 2018-02-02 DIAGNOSIS — I130 Hypertensive heart and chronic kidney disease with heart failure and stage 1 through stage 4 chronic kidney disease, or unspecified chronic kidney disease: Secondary | ICD-10-CM | POA: Diagnosis not present

## 2018-02-02 NOTE — Telephone Encounter (Signed)
[name] called to give an update. The shaking is better. Still very drowsy and weak. Daughter unable to get accurate weight but suspects an 8 lb weight loss. Pt is currently taking 40mg  of Lasix once daily in the morning. Will route.

## 2018-02-02 NOTE — Telephone Encounter (Signed)
OK, then let's try decreasing down to one every other day of the Lasix.

## 2018-02-03 NOTE — Telephone Encounter (Signed)
Patient advised. Number on message is not excepting calls.

## 2018-02-06 DIAGNOSIS — S82101D Unspecified fracture of upper end of right tibia, subsequent encounter for closed fracture with routine healing: Secondary | ICD-10-CM | POA: Diagnosis not present

## 2018-02-06 DIAGNOSIS — N183 Chronic kidney disease, stage 3 (moderate): Secondary | ICD-10-CM | POA: Diagnosis not present

## 2018-02-06 DIAGNOSIS — M810 Age-related osteoporosis without current pathological fracture: Secondary | ICD-10-CM | POA: Diagnosis not present

## 2018-02-06 DIAGNOSIS — I2510 Atherosclerotic heart disease of native coronary artery without angina pectoris: Secondary | ICD-10-CM | POA: Diagnosis not present

## 2018-02-06 DIAGNOSIS — S82221D Displaced transverse fracture of shaft of right tibia, subsequent encounter for closed fracture with routine healing: Secondary | ICD-10-CM | POA: Diagnosis not present

## 2018-02-06 DIAGNOSIS — M150 Primary generalized (osteo)arthritis: Secondary | ICD-10-CM | POA: Diagnosis not present

## 2018-02-06 DIAGNOSIS — S82421D Displaced transverse fracture of shaft of right fibula, subsequent encounter for closed fracture with routine healing: Secondary | ICD-10-CM | POA: Diagnosis not present

## 2018-02-06 DIAGNOSIS — I480 Paroxysmal atrial fibrillation: Secondary | ICD-10-CM | POA: Diagnosis not present

## 2018-02-06 DIAGNOSIS — E1122 Type 2 diabetes mellitus with diabetic chronic kidney disease: Secondary | ICD-10-CM | POA: Diagnosis not present

## 2018-02-06 DIAGNOSIS — I5032 Chronic diastolic (congestive) heart failure: Secondary | ICD-10-CM | POA: Diagnosis not present

## 2018-02-06 DIAGNOSIS — E1140 Type 2 diabetes mellitus with diabetic neuropathy, unspecified: Secondary | ICD-10-CM | POA: Diagnosis not present

## 2018-02-06 DIAGNOSIS — I130 Hypertensive heart and chronic kidney disease with heart failure and stage 1 through stage 4 chronic kidney disease, or unspecified chronic kidney disease: Secondary | ICD-10-CM | POA: Diagnosis not present

## 2018-02-06 DIAGNOSIS — S82001D Unspecified fracture of right patella, subsequent encounter for closed fracture with routine healing: Secondary | ICD-10-CM | POA: Diagnosis not present

## 2018-02-08 ENCOUNTER — Telehealth: Payer: Self-pay | Admitting: Family Medicine

## 2018-02-08 ENCOUNTER — Ambulatory Visit (INDEPENDENT_AMBULATORY_CARE_PROVIDER_SITE_OTHER): Payer: Medicare Other | Admitting: Family Medicine

## 2018-02-08 ENCOUNTER — Encounter: Payer: Self-pay | Admitting: Family Medicine

## 2018-02-08 VITALS — BP 130/49 | HR 65 | Ht 67.0 in | Wt 198.0 lb

## 2018-02-08 DIAGNOSIS — R0602 Shortness of breath: Secondary | ICD-10-CM

## 2018-02-08 DIAGNOSIS — G301 Alzheimer's disease with late onset: Secondary | ICD-10-CM

## 2018-02-08 DIAGNOSIS — F0280 Dementia in other diseases classified elsewhere without behavioral disturbance: Secondary | ICD-10-CM

## 2018-02-08 DIAGNOSIS — I5033 Acute on chronic diastolic (congestive) heart failure: Secondary | ICD-10-CM

## 2018-02-08 DIAGNOSIS — M79604 Pain in right leg: Secondary | ICD-10-CM | POA: Insufficient documentation

## 2018-02-08 MED ORDER — DONEPEZIL HCL 10 MG PO TABS: 10.0000 mg | ORAL_TABLET | Freq: Every day | ORAL | 1 refills | Status: DC

## 2018-02-08 MED ORDER — AMBULATORY NON FORMULARY MEDICATION: 0 refills | Status: DC

## 2018-02-08 NOTE — Telephone Encounter (Signed)
Faxed order and ov note and demographics and insurance to [facility] for over night Pulse Ox

## 2018-02-08 NOTE — Progress Notes (Signed)
Subjective:    Patient ID: [name], female    DOB: [birth year], 84 y.o.   MRN: [id number]  HPI   She is here to f/u on SOB and LE edema.  We put her on lasix and she has been much better.  When I saw her about a week ago I was unclear if this was related to heart failure, infection, pneumonia etc.  She does have known diastolic heart failure.  Pulse ox was in the normal range and she was afebrile.  We decided to try to diurese her over the weekend and then have her come back. She hasn't taken the fluid pill in about a week.  She was actually supposed to decrease the medication down to every other day.  She says the shortness of breath is much better but she still feeling very fatigued.  Her daughter says that she is been sleeping a lot.  She denies any chest pain.  She denies any dysuria.  Her blood sugars have been well controlled.  She has not been having any significant hypoglycemic events recently.  She is still feeling shakey on the inside and outside. She feels like she is "confused" but not sure what it is about.  She is already on Aricept and Namenda.  Please see previous notes. Seems more confused in the morning.    Dementia-her daughter is concerned that she is actually starting to act more anxious and panicky which is not like her.  She said this morning she just asked her couple of questions and she started to act very fretful.  Lady does not remember it happening this morning and she seems much more calm now.  She is not shaking or have a tremor.  Review of Systems  BP (!) 130/49   Pulse 65   Ht 5\' 7"  (1.702 m)   Wt 198 lb (89.8 kg)   SpO2 95%   BMI 31.01 kg/m     Allergies  Allergen Reactions  . Angiotensin Receptor Blockers Other (See Comments)    Renal dx.  Nephrology has recommended against use bc of hyperkalemia Other reaction(s): Other (See Comments) Renal dx.  Nephrology has recommended against use bc of hyperkalemia  . Ace Inhibitors     REACTION: cough   . Fosinopril Sodium     REACTION: Cough  . Glipizide Other (See Comments)    Hypoglycemia  . Metformin And Related Other (See Comments)    Renal function  . Sulfamethoxazole-Trimethoprim     REACTION: Unspecified    Past Medical History:  Diagnosis Date  . Anemia    Pernicious  . Atrial fibrillation ([place]) 1/11    Dr Gwenlyn Found  . Back pain   . Chronic anticoagulation    on coumadin  . Chronic kidney disease    chronic, stage III  . Complete heart block ([place]) 01/31/2015  . Cystitis   . Diabetes mellitus    w/neuro manifestations, type II  . Diarrhea   . Esophageal stricture   . GERD (gastroesophageal reflux disease)   . H/O cardiac catheterization 2010   normal coronary arteries, EF 60%  . Heart failure    chronic diatolic- 2 D echo 8/84 EF 60%  . Heart murmur   . Hiatal hernia   . History of kidney stones   . Hx of echocardiogram 04/04/13   EF 55-60%, mild MR  . Hyperlipidemia   . Hypertension   . IBS (irritable bowel syndrome)   . Memory loss   .  Microalbuminuria   . Non-stress test nonreactive 11/14   Lexiscan myoview negative for ischemia  . Osteoarthrosis involving multiple sites   . PAF (paroxysmal atrial fibrillation) ([place])    Last DCCV 12/29/2009  . Pancreatitis   . Peptic ulcer    unspec. w/o obstruction  . RBBB   . Senile osteoporosis   . Swelling    limb, left more than right ankle   . Symptomatic bradycardia 01/2010   CHB, temp pacer and then Medtronic PPM  . Thyroid disease   . Tubular adenoma of colon   . Ulcer    Gastric    Past Surgical History:  Procedure Laterality Date  . APPENDECTOMY    . CARDIAC CATHETERIZATION  2010   normal coronary arteries  . carotid dopplers- no significant stenosis  2007  . CATARACT EXTRACTION     both eyes  . EGD- esophageal stricture     gastric ulcer  . pacemaker placement  2/11   medtronic dual chamber  . TONSILLECTOMY AND ADENOIDECTOMY      Social History   Socioeconomic History  . Marital status:  Widowed    Spouse name: Not on file  . Number of children: Not on file  . Years of education: Not on file  . Highest education level: Not on file  Social Needs  . Financial resource strain: Not on file  . Food insecurity - worry: Not on file  . Food insecurity - inability: Not on file  . Transportation needs - medical: Not on file  . Transportation needs - non-medical: Not on file  Occupational History  . Not on file  Tobacco Use  . Smoking status: Never Smoker  . Smokeless tobacco: Never Used  Substance and Sexual Activity  . Alcohol use: No  . Drug use: No  . Sexual activity: No  Other Topics Concern  . Not on file  Social History Narrative  . Not on file    Family History  Problem Relation Age of Onset  . Diabetes Mother   . Prostate cancer Father   . Heart attack Father   . Colon cancer Sister   . Esophageal cancer Neg Hx   . Rectal cancer Neg Hx   . Stomach cancer Neg Hx   . Alzheimer's disease Sister   . Heart disease Brother     Outpatient Encounter Medications as of 02/08/2018  Medication Sig  . ACCU-CHEK COMPACT PLUS test strip USE TO CHECK BLOOD SUGAR TWICE DAILY  . AQUALANCE LANCETS 30G MISC   . ascorbic acid (VITAMIN C) 500 MG tablet Take 500 mg by mouth 3 (three) times daily with meals.  Marland Kitchen b complex vitamins capsule Take 1 capsule by mouth daily.  . bifidobacterium infantis (ALIGN) capsule 1 capsule by mouth daily  . Blood Glucose Calibration (OT ULTRA/FASTTK CNTRL SOLN) SOLN   . calcium citrate (CALCITRATE - DOSED IN MG ELEMENTAL CALCIUM) 950 MG tablet Take 950 mg by mouth daily.  . clotrimazole (LOTRIMIN) 1 % cream Apply 1 application topically 2 (two) times daily. Apply to affected areas twice daily  . donepezil (ARICEPT) 10 MG tablet Take 1 tablet (10 mg total) by mouth at bedtime.  . furosemide (LASIX) 40 MG tablet Take 1 tablet (40 mg total) by mouth daily as needed.  . gabapentin (NEURONTIN) 600 MG tablet TAKE 1 TABLET BY MOUTH EVERY MORNING, 2  TABLETS AT DINNER, AND 3 TABLETS NIGHTLY AT BEDTIME.  Marland Kitchen HYDROcodone-acetaminophen (NORCO/VICODIN) 5-325 MG tablet Take 1 tablet by mouth  every 6 (six) hours as needed. Take with food  . Insulin Glargine (LANTUS SOLOSTAR) 100 UNIT/ML Solostar Pen INJECT 28 UNITS INTO THE SKIN 2 (TWO) TIMES DAILY.  Marland Kitchen insulin regular (HUMULIN R) 100 units/mL injection 5 units SQ 10 minutes before evening meal.  . Insulin Syringe-Needle U-100 (EASY COMFORT INSULIN SYRINGE) 30G X 5/16" 0.5 ML MISC Use to inject insulin. Dx:E  . JANUVIA 100 MG tablet Take 1 tablet by mouth daily.  Marland Kitchen levothyroxine (SYNTHROID, LEVOTHROID) 100 MCG tablet TAKE ONE TABLET BY MOUTH DAILY  . losartan (COZAAR) 25 MG tablet Take 1 tablet (25 mg total) by mouth daily.  . memantine (NAMENDA) 5 MG tablet TAKE 1 TABLET BY MOUTH AT BEDTIME  . metoprolol tartrate (LOPRESSOR) 25 MG tablet TAKE 1 TABLET (25 MG TOTAL) BY MOUTH 2 (TWO) TIMES DAILY.  . Multiple Vitamins-Minerals (CERTAVITE SENIOR/ANTIOXIDANT) TABS Take 1 tablet by mouth daily.  . pantoprazole (PROTONIX) 40 MG tablet TAKE 1 TABLET (40 MG TOTAL) BY MOUTH DAILY.  Marland Kitchen penicillin v potassium (VEETID) 250 MG tablet Take 250 mg by mouth 3 (three) times daily.  . pioglitazone (ACTOS) 15 MG tablet TAKE 1 TABLET BY MOUTH EVERY DAY  . simvastatin (ZOCOR) 40 MG tablet Take 1/2 tab at bedtime.  Marland Kitchen warfarin (COUMADIN) 2 MG tablet Take 1 tablet by mouth daily as needed.  . warfarin (COUMADIN) 3 MG tablet Take 1 tablet by mouth daily as needed.  . zinc oxide 20 % ointment Apply 1 application topically 2 (two) times daily as needed for irritation (to external rectum and buttocks).  . [DISCONTINUED] donepezil (ARICEPT) 10 MG tablet Take 1 tablet (10 mg total) by mouth at bedtime.  . [DISCONTINUED] donepezil (ARICEPT) 5 MG tablet Take 1 tablet (5 mg total) by mouth at bedtime.  . AMBULATORY NON FORMULARY MEDICATION Medication Name: Asked to [facility] home [facility].  Attention Tabatha.  Need BMP on [name] time of  the week of February 18.  Diagnosis: medication management, lower extremity edema  . AMBULATORY NON FORMULARY MEDICATION Medication Name: Please contact [facility] home [facility] to do an overnight pulse oximetry.  Diagnosis of shortness of breath.   No facility-administered encounter medications on file as of 02/08/2018.          Objective:   Physical Exam  Constitutional: She is oriented to person, place, and time. She appears well-developed and well-nourished.  HENT:  Head: Normocephalic and atraumatic.  Cardiovascular: Normal rate, regular rhythm and normal heart sounds.  Pulmonary/Chest: Effort normal and breath sounds normal.  Neurological: She is alert and oriented to person, place, and time.  Skin: Skin is warm and dry.  Psychiatric: She has a normal mood and affect. Her behavior is normal.        Assessment & Plan:  Dementia, worsening -we will increase the Aricept to 10 mg daily.  Will repeat Mini-Mental status exam today.  Status exam score of 20 out of 30.  Passing score is 26 based on age.  Will increase Aricept to 10 mg daily.  Follow-up in 1 month.  She has had more confusion in the mornings I would like to have home health do an overnight pulse oximetry just to make sure that she is not dropping overnight.  Shortness of breath-much improved after getting about 8 pounds of fluid down.  At this point she is starting to retain fluid again so we will have her restart the Lasix every other day and then will have home health check a BMP in about  1-2 weeks.  Acute on chronic diastolic heart failure-we will restart Lasix every other day as above.  Think she is doing much better in that regard and lungs are clear on exam today. Recheck BMP in one week.

## 2018-02-08 NOTE — Patient Instructions (Signed)
I sent a new prescription to mail order for an increased dose on her Aricept. Okay to restart Lasix.  1 tab every other day.  We will see if home health can come out and draw a BMP in about 1-2 weeks.

## 2018-02-09 DIAGNOSIS — S82421D Displaced transverse fracture of shaft of right fibula, subsequent encounter for closed fracture with routine healing: Secondary | ICD-10-CM | POA: Diagnosis not present

## 2018-02-09 DIAGNOSIS — M150 Primary generalized (osteo)arthritis: Secondary | ICD-10-CM | POA: Diagnosis not present

## 2018-02-09 DIAGNOSIS — E1140 Type 2 diabetes mellitus with diabetic neuropathy, unspecified: Secondary | ICD-10-CM | POA: Diagnosis not present

## 2018-02-09 DIAGNOSIS — N183 Chronic kidney disease, stage 3 (moderate): Secondary | ICD-10-CM | POA: Diagnosis not present

## 2018-02-09 DIAGNOSIS — I2510 Atherosclerotic heart disease of native coronary artery without angina pectoris: Secondary | ICD-10-CM | POA: Diagnosis not present

## 2018-02-09 DIAGNOSIS — S82001D Unspecified fracture of right patella, subsequent encounter for closed fracture with routine healing: Secondary | ICD-10-CM | POA: Diagnosis not present

## 2018-02-09 DIAGNOSIS — E1122 Type 2 diabetes mellitus with diabetic chronic kidney disease: Secondary | ICD-10-CM | POA: Diagnosis not present

## 2018-02-09 DIAGNOSIS — S82101D Unspecified fracture of upper end of right tibia, subsequent encounter for closed fracture with routine healing: Secondary | ICD-10-CM | POA: Diagnosis not present

## 2018-02-09 DIAGNOSIS — I130 Hypertensive heart and chronic kidney disease with heart failure and stage 1 through stage 4 chronic kidney disease, or unspecified chronic kidney disease: Secondary | ICD-10-CM | POA: Diagnosis not present

## 2018-02-09 DIAGNOSIS — S82221D Displaced transverse fracture of shaft of right tibia, subsequent encounter for closed fracture with routine healing: Secondary | ICD-10-CM | POA: Diagnosis not present

## 2018-02-09 DIAGNOSIS — I5032 Chronic diastolic (congestive) heart failure: Secondary | ICD-10-CM | POA: Diagnosis not present

## 2018-02-09 DIAGNOSIS — M810 Age-related osteoporosis without current pathological fracture: Secondary | ICD-10-CM | POA: Diagnosis not present

## 2018-02-09 DIAGNOSIS — I480 Paroxysmal atrial fibrillation: Secondary | ICD-10-CM | POA: Diagnosis not present

## 2018-02-13 DIAGNOSIS — S82221D Displaced transverse fracture of shaft of right tibia, subsequent encounter for closed fracture with routine healing: Secondary | ICD-10-CM | POA: Diagnosis not present

## 2018-02-13 DIAGNOSIS — M150 Primary generalized (osteo)arthritis: Secondary | ICD-10-CM | POA: Diagnosis not present

## 2018-02-13 DIAGNOSIS — S82421D Displaced transverse fracture of shaft of right fibula, subsequent encounter for closed fracture with routine healing: Secondary | ICD-10-CM | POA: Diagnosis not present

## 2018-02-13 DIAGNOSIS — M810 Age-related osteoporosis without current pathological fracture: Secondary | ICD-10-CM | POA: Diagnosis not present

## 2018-02-13 DIAGNOSIS — E1140 Type 2 diabetes mellitus with diabetic neuropathy, unspecified: Secondary | ICD-10-CM | POA: Diagnosis not present

## 2018-02-13 DIAGNOSIS — I480 Paroxysmal atrial fibrillation: Secondary | ICD-10-CM | POA: Diagnosis not present

## 2018-02-13 DIAGNOSIS — S82101D Unspecified fracture of upper end of right tibia, subsequent encounter for closed fracture with routine healing: Secondary | ICD-10-CM | POA: Diagnosis not present

## 2018-02-13 DIAGNOSIS — I2510 Atherosclerotic heart disease of native coronary artery without angina pectoris: Secondary | ICD-10-CM | POA: Diagnosis not present

## 2018-02-13 DIAGNOSIS — I130 Hypertensive heart and chronic kidney disease with heart failure and stage 1 through stage 4 chronic kidney disease, or unspecified chronic kidney disease: Secondary | ICD-10-CM | POA: Diagnosis not present

## 2018-02-13 DIAGNOSIS — I5032 Chronic diastolic (congestive) heart failure: Secondary | ICD-10-CM | POA: Diagnosis not present

## 2018-02-13 DIAGNOSIS — N183 Chronic kidney disease, stage 3 (moderate): Secondary | ICD-10-CM | POA: Diagnosis not present

## 2018-02-13 DIAGNOSIS — E1122 Type 2 diabetes mellitus with diabetic chronic kidney disease: Secondary | ICD-10-CM | POA: Diagnosis not present

## 2018-02-13 DIAGNOSIS — S82001D Unspecified fracture of right patella, subsequent encounter for closed fracture with routine healing: Secondary | ICD-10-CM | POA: Diagnosis not present

## 2018-02-14 ENCOUNTER — Ambulatory Visit (INDEPENDENT_AMBULATORY_CARE_PROVIDER_SITE_OTHER): Payer: Medicare Other | Admitting: Pharmacist Clinician (PhC)/ Clinical Pharmacy Specialist

## 2018-02-14 DIAGNOSIS — Z8673 Personal history of transient ischemic attack (TIA), and cerebral infarction without residual deficits: Secondary | ICD-10-CM | POA: Diagnosis not present

## 2018-02-14 DIAGNOSIS — I480 Paroxysmal atrial fibrillation: Secondary | ICD-10-CM | POA: Diagnosis not present

## 2018-02-14 DIAGNOSIS — E1140 Type 2 diabetes mellitus with diabetic neuropathy, unspecified: Secondary | ICD-10-CM | POA: Diagnosis not present

## 2018-02-14 DIAGNOSIS — Z7901 Long term (current) use of anticoagulants: Secondary | ICD-10-CM

## 2018-02-14 DIAGNOSIS — S82221D Displaced transverse fracture of shaft of right tibia, subsequent encounter for closed fracture with routine healing: Secondary | ICD-10-CM | POA: Diagnosis not present

## 2018-02-14 DIAGNOSIS — I130 Hypertensive heart and chronic kidney disease with heart failure and stage 1 through stage 4 chronic kidney disease, or unspecified chronic kidney disease: Secondary | ICD-10-CM | POA: Diagnosis not present

## 2018-02-14 DIAGNOSIS — I2510 Atherosclerotic heart disease of native coronary artery without angina pectoris: Secondary | ICD-10-CM | POA: Diagnosis not present

## 2018-02-14 DIAGNOSIS — S82001D Unspecified fracture of right patella, subsequent encounter for closed fracture with routine healing: Secondary | ICD-10-CM | POA: Diagnosis not present

## 2018-02-14 DIAGNOSIS — E1122 Type 2 diabetes mellitus with diabetic chronic kidney disease: Secondary | ICD-10-CM | POA: Diagnosis not present

## 2018-02-14 DIAGNOSIS — M810 Age-related osteoporosis without current pathological fracture: Secondary | ICD-10-CM | POA: Diagnosis not present

## 2018-02-14 DIAGNOSIS — S82101D Unspecified fracture of upper end of right tibia, subsequent encounter for closed fracture with routine healing: Secondary | ICD-10-CM | POA: Diagnosis not present

## 2018-02-14 DIAGNOSIS — I5032 Chronic diastolic (congestive) heart failure: Secondary | ICD-10-CM | POA: Diagnosis not present

## 2018-02-14 DIAGNOSIS — S82421D Displaced transverse fracture of shaft of right fibula, subsequent encounter for closed fracture with routine healing: Secondary | ICD-10-CM | POA: Diagnosis not present

## 2018-02-14 DIAGNOSIS — N183 Chronic kidney disease, stage 3 (moderate): Secondary | ICD-10-CM | POA: Diagnosis not present

## 2018-02-14 DIAGNOSIS — M150 Primary generalized (osteo)arthritis: Secondary | ICD-10-CM | POA: Diagnosis not present

## 2018-02-14 LAB — PROTIME-INR: INR: 5.5 — AB (ref <=1.1)

## 2018-02-15 ENCOUNTER — Telehealth: Payer: Self-pay

## 2018-02-15 ENCOUNTER — Other Ambulatory Visit: Payer: Self-pay

## 2018-02-15 DIAGNOSIS — I2510 Atherosclerotic heart disease of native coronary artery without angina pectoris: Secondary | ICD-10-CM | POA: Diagnosis not present

## 2018-02-15 DIAGNOSIS — S82001D Unspecified fracture of right patella, subsequent encounter for closed fracture with routine healing: Secondary | ICD-10-CM | POA: Diagnosis not present

## 2018-02-15 DIAGNOSIS — S82421D Displaced transverse fracture of shaft of right fibula, subsequent encounter for closed fracture with routine healing: Secondary | ICD-10-CM | POA: Diagnosis not present

## 2018-02-15 DIAGNOSIS — E1122 Type 2 diabetes mellitus with diabetic chronic kidney disease: Secondary | ICD-10-CM | POA: Diagnosis not present

## 2018-02-15 DIAGNOSIS — N183 Chronic kidney disease, stage 3 (moderate): Secondary | ICD-10-CM | POA: Diagnosis not present

## 2018-02-15 DIAGNOSIS — I480 Paroxysmal atrial fibrillation: Secondary | ICD-10-CM | POA: Diagnosis not present

## 2018-02-15 DIAGNOSIS — R300 Dysuria: Secondary | ICD-10-CM | POA: Diagnosis not present

## 2018-02-15 DIAGNOSIS — S82221D Displaced transverse fracture of shaft of right tibia, subsequent encounter for closed fracture with routine healing: Secondary | ICD-10-CM | POA: Diagnosis not present

## 2018-02-15 DIAGNOSIS — I130 Hypertensive heart and chronic kidney disease with heart failure and stage 1 through stage 4 chronic kidney disease, or unspecified chronic kidney disease: Secondary | ICD-10-CM | POA: Diagnosis not present

## 2018-02-15 DIAGNOSIS — S82101D Unspecified fracture of upper end of right tibia, subsequent encounter for closed fracture with routine healing: Secondary | ICD-10-CM | POA: Diagnosis not present

## 2018-02-15 DIAGNOSIS — M150 Primary generalized (osteo)arthritis: Secondary | ICD-10-CM | POA: Diagnosis not present

## 2018-02-15 DIAGNOSIS — M810 Age-related osteoporosis without current pathological fracture: Secondary | ICD-10-CM | POA: Diagnosis not present

## 2018-02-15 DIAGNOSIS — E1140 Type 2 diabetes mellitus with diabetic neuropathy, unspecified: Secondary | ICD-10-CM | POA: Diagnosis not present

## 2018-02-15 DIAGNOSIS — I5032 Chronic diastolic (congestive) heart failure: Secondary | ICD-10-CM | POA: Diagnosis not present

## 2018-02-15 NOTE — Telephone Encounter (Signed)
[name] called and states her mom has been having some low fasting blood sugars. 59, 76 and 107. She has not been giving her the Humulin for sometime. She is giving her the Lantus 22 units twice daily. I advised her not to change anything until advised by Dr Madilyn Fireman. I also advised her to see if she can get her to eat 6 some meals through out the day.   [name] also reports that yesterday morning she woke early around 2 am and was acting strange. She was talking strange and moving her arms and legs as if she was riding a bike.   [name] reports last Friday she complained of painful urination and had a fever.   I sent an order through [facility] for UC and UA. I called and spoke with the Nurse Manage to expedite the order since there was someone from [facility] schedule to come out today.

## 2018-02-16 DIAGNOSIS — E1122 Type 2 diabetes mellitus with diabetic chronic kidney disease: Secondary | ICD-10-CM | POA: Diagnosis not present

## 2018-02-16 DIAGNOSIS — M150 Primary generalized (osteo)arthritis: Secondary | ICD-10-CM | POA: Diagnosis not present

## 2018-02-16 DIAGNOSIS — N183 Chronic kidney disease, stage 3 (moderate): Secondary | ICD-10-CM | POA: Diagnosis not present

## 2018-02-16 DIAGNOSIS — E1140 Type 2 diabetes mellitus with diabetic neuropathy, unspecified: Secondary | ICD-10-CM | POA: Diagnosis not present

## 2018-02-16 DIAGNOSIS — S82101D Unspecified fracture of upper end of right tibia, subsequent encounter for closed fracture with routine healing: Secondary | ICD-10-CM | POA: Diagnosis not present

## 2018-02-16 DIAGNOSIS — S82421D Displaced transverse fracture of shaft of right fibula, subsequent encounter for closed fracture with routine healing: Secondary | ICD-10-CM | POA: Diagnosis not present

## 2018-02-16 DIAGNOSIS — I130 Hypertensive heart and chronic kidney disease with heart failure and stage 1 through stage 4 chronic kidney disease, or unspecified chronic kidney disease: Secondary | ICD-10-CM | POA: Diagnosis not present

## 2018-02-16 DIAGNOSIS — I480 Paroxysmal atrial fibrillation: Secondary | ICD-10-CM | POA: Diagnosis not present

## 2018-02-16 DIAGNOSIS — I2510 Atherosclerotic heart disease of native coronary artery without angina pectoris: Secondary | ICD-10-CM | POA: Diagnosis not present

## 2018-02-16 DIAGNOSIS — S82221D Displaced transverse fracture of shaft of right tibia, subsequent encounter for closed fracture with routine healing: Secondary | ICD-10-CM | POA: Diagnosis not present

## 2018-02-16 DIAGNOSIS — S82001D Unspecified fracture of right patella, subsequent encounter for closed fracture with routine healing: Secondary | ICD-10-CM | POA: Diagnosis not present

## 2018-02-16 DIAGNOSIS — M810 Age-related osteoporosis without current pathological fracture: Secondary | ICD-10-CM | POA: Diagnosis not present

## 2018-02-16 DIAGNOSIS — I5032 Chronic diastolic (congestive) heart failure: Secondary | ICD-10-CM | POA: Diagnosis not present

## 2018-02-16 LAB — MICROSCOPIC EXAMINATION
Casts: NONE SEEN /lpf
WBC, UA: >30 /hpf — AB (ref 0–?)

## 2018-02-16 LAB — URINALYSIS, ROUTINE W REFLEX MICROSCOPIC
BILIRUBIN UA: NEGATIVE
GLUCOSE, UA: NEGATIVE
KETONES UA: NEGATIVE
NITRITE UA: POSITIVE — AB
PROTEIN UA: NEGATIVE
SPEC GRAV UA: 1.009 (ref 1.005–1.030)
Urobilinogen, Ur: 0.2 mg/dL (ref 0.2–1.0)
pH, UA: 5.5 (ref 5.0–7.5)

## 2018-02-16 NOTE — Telephone Encounter (Signed)
Ok to decrease the lantus to 18 at night and keep 22 in the AM.  Call if sugars still running low after the weekend.  Agree with order for UA, etc

## 2018-02-16 NOTE — Telephone Encounter (Signed)
Daughter advised of recommendations. She states Cumi has had diarrhea 4 times today. She is still very confused.

## 2018-02-16 NOTE — Telephone Encounter (Signed)
[facility], they have the urinalysis results available. Faxing them to office now.

## 2018-02-17 DIAGNOSIS — S82221D Displaced transverse fracture of shaft of right tibia, subsequent encounter for closed fracture with routine healing: Secondary | ICD-10-CM | POA: Diagnosis not present

## 2018-02-17 DIAGNOSIS — S82001D Unspecified fracture of right patella, subsequent encounter for closed fracture with routine healing: Secondary | ICD-10-CM | POA: Diagnosis not present

## 2018-02-17 DIAGNOSIS — I2510 Atherosclerotic heart disease of native coronary artery without angina pectoris: Secondary | ICD-10-CM | POA: Diagnosis not present

## 2018-02-17 DIAGNOSIS — M810 Age-related osteoporosis without current pathological fracture: Secondary | ICD-10-CM | POA: Diagnosis not present

## 2018-02-17 DIAGNOSIS — I5032 Chronic diastolic (congestive) heart failure: Secondary | ICD-10-CM | POA: Diagnosis not present

## 2018-02-17 DIAGNOSIS — S82421D Displaced transverse fracture of shaft of right fibula, subsequent encounter for closed fracture with routine healing: Secondary | ICD-10-CM | POA: Diagnosis not present

## 2018-02-17 DIAGNOSIS — I130 Hypertensive heart and chronic kidney disease with heart failure and stage 1 through stage 4 chronic kidney disease, or unspecified chronic kidney disease: Secondary | ICD-10-CM | POA: Diagnosis not present

## 2018-02-17 DIAGNOSIS — S82101D Unspecified fracture of upper end of right tibia, subsequent encounter for closed fracture with routine healing: Secondary | ICD-10-CM | POA: Diagnosis not present

## 2018-02-17 DIAGNOSIS — N183 Chronic kidney disease, stage 3 (moderate): Secondary | ICD-10-CM | POA: Diagnosis not present

## 2018-02-17 DIAGNOSIS — E1122 Type 2 diabetes mellitus with diabetic chronic kidney disease: Secondary | ICD-10-CM | POA: Diagnosis not present

## 2018-02-17 DIAGNOSIS — E1140 Type 2 diabetes mellitus with diabetic neuropathy, unspecified: Secondary | ICD-10-CM | POA: Diagnosis not present

## 2018-02-17 DIAGNOSIS — I480 Paroxysmal atrial fibrillation: Secondary | ICD-10-CM | POA: Diagnosis not present

## 2018-02-17 DIAGNOSIS — M150 Primary generalized (osteo)arthritis: Secondary | ICD-10-CM | POA: Diagnosis not present

## 2018-02-17 MED ORDER — NITROFURANTOIN MONOHYD MACRO 100 MG PO CAPS: 100.0000 mg | ORAL_CAPSULE | Freq: Two times a day (BID) | ORAL | 0 refills | Status: DC

## 2018-02-17 NOTE — Telephone Encounter (Signed)
Prescription sent for [facility] sent to pharmacy.  Culture is still pending.  If she is now having diarrhea she may need to actually be seen the.  Diarrhea is not very typical with just a plain urinary tract infection.  Make sure she is drinking plenty of water and staying hydrated.

## 2018-02-17 NOTE — Telephone Encounter (Signed)
[name] advised of new Rx. Spoke with daughter regarding the diarrhea. She states it is only from when she wakes up until about lunch time, then subsides. Spoke with PCP, advised to push fluids and start antibiotics. Daughter to call clinic on Monday if the diarrhea is not better. Advised daughter if the diarrhea gets worse or if Pt stops drinking over the weekend to take her to the ED. Verbalized understanding.

## 2018-02-20 DIAGNOSIS — S82101D Unspecified fracture of upper end of right tibia, subsequent encounter for closed fracture with routine healing: Secondary | ICD-10-CM | POA: Diagnosis not present

## 2018-02-20 DIAGNOSIS — I130 Hypertensive heart and chronic kidney disease with heart failure and stage 1 through stage 4 chronic kidney disease, or unspecified chronic kidney disease: Secondary | ICD-10-CM | POA: Diagnosis not present

## 2018-02-20 DIAGNOSIS — I480 Paroxysmal atrial fibrillation: Secondary | ICD-10-CM | POA: Diagnosis not present

## 2018-02-20 DIAGNOSIS — I2510 Atherosclerotic heart disease of native coronary artery without angina pectoris: Secondary | ICD-10-CM | POA: Diagnosis not present

## 2018-02-20 DIAGNOSIS — E1140 Type 2 diabetes mellitus with diabetic neuropathy, unspecified: Secondary | ICD-10-CM | POA: Diagnosis not present

## 2018-02-20 DIAGNOSIS — N183 Chronic kidney disease, stage 3 (moderate): Secondary | ICD-10-CM | POA: Diagnosis not present

## 2018-02-20 DIAGNOSIS — I5032 Chronic diastolic (congestive) heart failure: Secondary | ICD-10-CM | POA: Diagnosis not present

## 2018-02-20 DIAGNOSIS — S82421D Displaced transverse fracture of shaft of right fibula, subsequent encounter for closed fracture with routine healing: Secondary | ICD-10-CM | POA: Diagnosis not present

## 2018-02-20 DIAGNOSIS — M150 Primary generalized (osteo)arthritis: Secondary | ICD-10-CM | POA: Diagnosis not present

## 2018-02-20 DIAGNOSIS — S82221D Displaced transverse fracture of shaft of right tibia, subsequent encounter for closed fracture with routine healing: Secondary | ICD-10-CM | POA: Diagnosis not present

## 2018-02-20 DIAGNOSIS — E1122 Type 2 diabetes mellitus with diabetic chronic kidney disease: Secondary | ICD-10-CM | POA: Diagnosis not present

## 2018-02-20 DIAGNOSIS — S82001D Unspecified fracture of right patella, subsequent encounter for closed fracture with routine healing: Secondary | ICD-10-CM | POA: Diagnosis not present

## 2018-02-20 DIAGNOSIS — M810 Age-related osteoporosis without current pathological fracture: Secondary | ICD-10-CM | POA: Diagnosis not present

## 2018-02-21 ENCOUNTER — Ambulatory Visit (INDEPENDENT_AMBULATORY_CARE_PROVIDER_SITE_OTHER): Payer: Medicare Other | Admitting: Pharmacist Clinician (PhC)/ Clinical Pharmacy Specialist

## 2018-02-21 ENCOUNTER — Encounter: Payer: Self-pay | Admitting: Family Medicine

## 2018-02-21 ENCOUNTER — Other Ambulatory Visit: Payer: Self-pay | Admitting: Family Medicine

## 2018-02-21 ENCOUNTER — Ambulatory Visit (INDEPENDENT_AMBULATORY_CARE_PROVIDER_SITE_OTHER): Payer: Medicare Other | Admitting: Family Medicine

## 2018-02-21 VITALS — BP 130/58 | HR 67 | Temp 98.6°F | Wt 194.0 lb

## 2018-02-21 DIAGNOSIS — N183 Chronic kidney disease, stage 3 (moderate): Secondary | ICD-10-CM | POA: Diagnosis not present

## 2018-02-21 DIAGNOSIS — R197 Diarrhea, unspecified: Secondary | ICD-10-CM | POA: Diagnosis not present

## 2018-02-21 DIAGNOSIS — E162 Hypoglycemia, unspecified: Secondary | ICD-10-CM | POA: Diagnosis not present

## 2018-02-21 DIAGNOSIS — M810 Age-related osteoporosis without current pathological fracture: Secondary | ICD-10-CM | POA: Diagnosis not present

## 2018-02-21 DIAGNOSIS — Z7901 Long term (current) use of anticoagulants: Secondary | ICD-10-CM | POA: Diagnosis not present

## 2018-02-21 DIAGNOSIS — R5383 Other fatigue: Secondary | ICD-10-CM

## 2018-02-21 DIAGNOSIS — S82421D Displaced transverse fracture of shaft of right fibula, subsequent encounter for closed fracture with routine healing: Secondary | ICD-10-CM | POA: Diagnosis not present

## 2018-02-21 DIAGNOSIS — I5032 Chronic diastolic (congestive) heart failure: Secondary | ICD-10-CM | POA: Diagnosis not present

## 2018-02-21 DIAGNOSIS — I480 Paroxysmal atrial fibrillation: Secondary | ICD-10-CM

## 2018-02-21 DIAGNOSIS — I2510 Atherosclerotic heart disease of native coronary artery without angina pectoris: Secondary | ICD-10-CM | POA: Diagnosis not present

## 2018-02-21 DIAGNOSIS — S82001D Unspecified fracture of right patella, subsequent encounter for closed fracture with routine healing: Secondary | ICD-10-CM | POA: Diagnosis not present

## 2018-02-21 DIAGNOSIS — S82101D Unspecified fracture of upper end of right tibia, subsequent encounter for closed fracture with routine healing: Secondary | ICD-10-CM | POA: Diagnosis not present

## 2018-02-21 DIAGNOSIS — E1122 Type 2 diabetes mellitus with diabetic chronic kidney disease: Secondary | ICD-10-CM | POA: Diagnosis not present

## 2018-02-21 DIAGNOSIS — I130 Hypertensive heart and chronic kidney disease with heart failure and stage 1 through stage 4 chronic kidney disease, or unspecified chronic kidney disease: Secondary | ICD-10-CM | POA: Diagnosis not present

## 2018-02-21 DIAGNOSIS — M150 Primary generalized (osteo)arthritis: Secondary | ICD-10-CM | POA: Diagnosis not present

## 2018-02-21 DIAGNOSIS — E1140 Type 2 diabetes mellitus with diabetic neuropathy, unspecified: Secondary | ICD-10-CM | POA: Diagnosis not present

## 2018-02-21 DIAGNOSIS — S82221D Displaced transverse fracture of shaft of right tibia, subsequent encounter for closed fracture with routine healing: Secondary | ICD-10-CM | POA: Diagnosis not present

## 2018-02-21 LAB — POCT INR: INR: 3.2

## 2018-02-21 LAB — GLUCOSE, POCT (MANUAL RESULT ENTRY): POC Glucose: 208 mg/dl — AB (ref 70–99)

## 2018-02-21 NOTE — Patient Instructions (Signed)
Stop nighttime dose of Lantus and just continue the 22 units in the morning. Really try to push fluids and encourage intake of water. Recommend starting an over-the-counter probiotic.  Examples include Culturelle, Florastor, align. We will get stool cultures and check for bacteria called C. difficile.

## 2018-02-21 NOTE — Progress Notes (Signed)
Subjective:    Patient ID: [name], female    DOB: 22-Jul-1928, 84 y.o.   MRN: [id number]  HPI  The 84 year old female with a history of paroxysmal atrial fibrillation on chronic anticoagulation comes in today complaining of diarrhea occurs predominantly in the mornings when she wakes up and then usually subsides around lunchtime..  In fact about a week ago her daughter noticed that she was having some slight change in mental status.  She does have a history of recurrent urinary tract infections so urinalysis was sent which was positive so.  So we had sent in a prescription for Macrobid.  The diarrhea had actually started before she started taking the Macrobid.  She is also been having some lower blood sugars around that time so we actually had her decrease her Lantus down to 18 units at night and keep the 20  in the morning. Last BM was this AM and was normal but had diarrhea yesterday.  NO nausea or vomiting.  He denies any abdominal pain or cramping.  No blood in the stool.  Her daughter who is here with her today just says that she is been out of it particularly more in the mornings.  She says normally she will come over and sit with her and then watch a couple shows and then talk about them together and she is been falling asleep during them in a little more disoriented than normal.  She is been eating and drinking very little that her daughter says that that is not really new.  That has been an ongoing problem.  She reports that she sleeps well at night.  DM - AM sugars still in the 60-70 range ven after decreasing night time insulin dose by 4 units.    Review of Systems  BP (!) 130/58   Pulse 67   Temp 98.6 F (37 C)   Wt 194 lb (88 kg)   SpO2 96%   BMI 30.38 kg/m     Allergies  Allergen Reactions  . Angiotensin Receptor Blockers Other (See Comments)    Renal dx.  Nephrology has recommended against use bc of hyperkalemia Other reaction(s): Other (See Comments) Renal dx.   Nephrology has recommended against use bc of hyperkalemia  . Ace Inhibitors     REACTION: cough  . Fosinopril Sodium     REACTION: Cough  . Glipizide Other (See Comments)    Hypoglycemia  . Metformin And Related Other (See Comments)    Renal function  . Sulfamethoxazole-Trimethoprim     REACTION: Unspecified    Past Medical History:  Diagnosis Date  . Anemia    Pernicious  . Atrial fibrillation ([place]) 1/11    Dr Gwenlyn Found  . Back pain   . Chronic anticoagulation    on coumadin  . Chronic kidney disease    chronic, stage III  . Complete heart block ([place]) 01/31/2015  . Cystitis   . Diabetes mellitus    w/neuro manifestations, type II  . Diarrhea   . Esophageal stricture   . GERD (gastroesophageal reflux disease)   . H/O cardiac catheterization 2010   normal coronary arteries, EF 60%  . Heart failure    chronic diatolic- 2 D echo 3/66 EF 60%  . Heart murmur   . Hiatal hernia   . History of kidney stones   . Hx of echocardiogram 04/04/13   EF 55-60%, mild MR  . Hyperlipidemia   . Hypertension   . IBS (irritable bowel syndrome)   .  Memory loss   . Microalbuminuria   . Non-stress test nonreactive 11/14   Lexiscan myoview negative for ischemia  . Osteoarthrosis involving multiple sites   . PAF (paroxysmal atrial fibrillation) ([place])    Last DCCV 12/29/2009  . Pancreatitis   . Peptic ulcer    unspec. w/o obstruction  . RBBB   . Senile osteoporosis   . Swelling    limb, left more than right ankle   . Symptomatic bradycardia 01/2010   CHB, temp pacer and then Medtronic PPM  . Thyroid disease   . Tubular adenoma of colon   . Ulcer    Gastric    Past Surgical History:  Procedure Laterality Date  . APPENDECTOMY    . CARDIAC CATHETERIZATION  2010   normal coronary arteries  . carotid dopplers- no significant stenosis  2007  . CATARACT EXTRACTION     both eyes  . EGD- esophageal stricture     gastric ulcer  . pacemaker placement  2/11   medtronic dual chamber  .  TONSILLECTOMY AND ADENOIDECTOMY      Social History   Socioeconomic History  . Marital status: Widowed    Spouse name: Not on file  . Number of children: Not on file  . Years of education: Not on file  . Highest education level: Not on file  Social Needs  . Financial resource strain: Not on file  . Food insecurity - worry: Not on file  . Food insecurity - inability: Not on file  . Transportation needs - medical: Not on file  . Transportation needs - non-medical: Not on file  Occupational History  . Not on file  Tobacco Use  . Smoking status: Never Smoker  . Smokeless tobacco: Never Used  Substance and Sexual Activity  . Alcohol use: No  . Drug use: No  . Sexual activity: No  Other Topics Concern  . Not on file  Social History Narrative  . Not on file    Family History  Problem Relation Age of Onset  . Diabetes Mother   . Prostate cancer Father   . Heart attack Father   . Colon cancer Sister   . Esophageal cancer Neg Hx   . Rectal cancer Neg Hx   . Stomach cancer Neg Hx   . Alzheimer's disease Sister   . Heart disease Brother     Outpatient Encounter Medications as of 02/21/2018  Medication Sig  . ACCU-CHEK COMPACT PLUS test strip USE TO CHECK BLOOD SUGAR TWICE DAILY  . AMBULATORY NON FORMULARY MEDICATION Medication Name: Asked to [facility] home [facility].  Attention Tabatha.  Need BMP on [name] time of the week of February 18.  Diagnosis: medication management, lower extremity edema  . AMBULATORY NON FORMULARY MEDICATION Medication Name: Please contact [facility] home care to do an overnight pulse oximetry.  Diagnosis of shortness of breath.  Marland Kitchen AQUALANCE LANCETS 30G MISC   . ascorbic acid (VITAMIN C) 500 MG tablet Take 500 mg by mouth 3 (three) times daily with meals.  Marland Kitchen b complex vitamins capsule Take 1 capsule by mouth daily.  . bifidobacterium infantis (ALIGN) capsule 1 capsule by mouth daily  . Blood Glucose Calibration (OT ULTRA/FASTTK CNTRL SOLN) SOLN   . calcium  citrate (CALCITRATE - DOSED IN MG ELEMENTAL CALCIUM) 950 MG tablet Take 950 mg by mouth daily.  . clotrimazole (LOTRIMIN) 1 % cream Apply 1 application topically 2 (two) times daily. Apply to affected areas twice daily  . donepezil (ARICEPT) 10  MG tablet Take 1 tablet (10 mg total) by mouth at bedtime.  . furosemide (LASIX) 40 MG tablet Take 1 tablet (40 mg total) by mouth daily as needed.  . gabapentin (NEURONTIN) 600 MG tablet TAKE 1 TABLET BY MOUTH EVERY MORNING, 2 TABLETS AT DINNER, AND 3 TABLETS NIGHTLY AT BEDTIME.  Marland Kitchen HYDROcodone-acetaminophen (NORCO/VICODIN) 5-325 MG tablet Take 1 tablet by mouth every 6 (six) hours as needed. Take with food  . Insulin Glargine (LANTUS SOLOSTAR) 100 UNIT/ML Solostar Pen INJECT 28 UNITS INTO THE SKIN 2 (TWO) TIMES DAILY.  Marland Kitchen insulin regular (HUMULIN R) 100 units/mL injection 5 units SQ 10 minutes before evening meal.  . Insulin Syringe-Needle U-100 (EASY COMFORT INSULIN SYRINGE) 30G X 5/16" 0.5 ML MISC Use to inject insulin. Dx:E  . JANUVIA 100 MG tablet Take 1 tablet by mouth daily.  Marland Kitchen levothyroxine (SYNTHROID, LEVOTHROID) 100 MCG tablet TAKE ONE TABLET BY MOUTH DAILY  . losartan (COZAAR) 25 MG tablet Take 1 tablet (25 mg total) by mouth daily.  . memantine (NAMENDA) 5 MG tablet TAKE 1 TABLET BY MOUTH AT BEDTIME  . metoprolol tartrate (LOPRESSOR) 25 MG tablet TAKE 1 TABLET (25 MG TOTAL) BY MOUTH 2 (TWO) TIMES DAILY.  . Multiple Vitamins-Minerals (CERTAVITE SENIOR/ANTIOXIDANT) TABS Take 1 tablet by mouth daily.  . nitrofurantoin, macrocrystal-monohydrate, (MACROBID) 100 MG capsule Take 1 capsule (100 mg total) by mouth 2 (two) times daily.  . pantoprazole (PROTONIX) 40 MG tablet TAKE 1 TABLET (40 MG TOTAL) BY MOUTH DAILY.  Marland Kitchen penicillin v potassium (VEETID) 250 MG tablet Take 250 mg by mouth 3 (three) times daily.  . pioglitazone (ACTOS) 15 MG tablet TAKE 1 TABLET BY MOUTH EVERY DAY  . simvastatin (ZOCOR) 40 MG tablet Take 1/2 tab at bedtime.  Marland Kitchen warfarin  (COUMADIN) 2 MG tablet Take 1 tablet by mouth daily as needed.  . warfarin (COUMADIN) 3 MG tablet Take 1 tablet by mouth daily as needed.  . zinc oxide 20 % ointment Apply 1 application topically 2 (two) times daily as needed for irritation (to external rectum and buttocks).   No facility-administered encounter medications on file as of 02/21/2018.          Objective:   Physical Exam  Constitutional: She is oriented to person, place, and time. She appears well-developed and well-nourished.  HENT:  Head: Normocephalic and atraumatic.  Cardiovascular: Normal rate, regular rhythm and normal heart sounds.  Pulmonary/Chest: Effort normal and breath sounds normal.  Abdominal: Soft. Bowel sounds are normal. She exhibits no distension and no mass. There is no tenderness. There is no rebound and no guarding.  Neurological: She is alert and oriented to person, place, and time.  Skin: Skin is warm and dry.  Psychiatric: She has a normal mood and affect. Her behavior is normal.          Assessment & Plan:  Persistent diarrhea-unclear etiology.  Will check for C. difficile and do a stool culture.  It may also be because she is been on 3 times daily penicillin 4 months.  Did encourage her to consider a trial of a probiotic.  I did not order a CBC today but she is not experiencing any abdominal pain or fever.  Will call with results once available.  Okay to use Imodium as needed if results are negative.  Consider referral to gastroenterology.  Hyopglycemia -we will stop the evening dose of insulin and just keep the morning dose.  Fatigue/excess sleepiness during the daytime-could be because of low blood  sugars.  Again we will stop her evening dose of insulin for now.  Difficulty as she tends to have spikes because the foods that she does eat a very sugary such as cookies for breakfast etc. SO her sugar spikes and then plummets.

## 2018-02-22 DIAGNOSIS — I2510 Atherosclerotic heart disease of native coronary artery without angina pectoris: Secondary | ICD-10-CM | POA: Diagnosis not present

## 2018-02-22 DIAGNOSIS — S82101D Unspecified fracture of upper end of right tibia, subsequent encounter for closed fracture with routine healing: Secondary | ICD-10-CM | POA: Diagnosis not present

## 2018-02-22 DIAGNOSIS — I130 Hypertensive heart and chronic kidney disease with heart failure and stage 1 through stage 4 chronic kidney disease, or unspecified chronic kidney disease: Secondary | ICD-10-CM | POA: Diagnosis not present

## 2018-02-22 DIAGNOSIS — S82001D Unspecified fracture of right patella, subsequent encounter for closed fracture with routine healing: Secondary | ICD-10-CM | POA: Diagnosis not present

## 2018-02-22 DIAGNOSIS — N183 Chronic kidney disease, stage 3 (moderate): Secondary | ICD-10-CM | POA: Diagnosis not present

## 2018-02-22 DIAGNOSIS — E1140 Type 2 diabetes mellitus with diabetic neuropathy, unspecified: Secondary | ICD-10-CM | POA: Diagnosis not present

## 2018-02-22 DIAGNOSIS — M150 Primary generalized (osteo)arthritis: Secondary | ICD-10-CM | POA: Diagnosis not present

## 2018-02-22 DIAGNOSIS — M810 Age-related osteoporosis without current pathological fracture: Secondary | ICD-10-CM | POA: Diagnosis not present

## 2018-02-22 DIAGNOSIS — I480 Paroxysmal atrial fibrillation: Secondary | ICD-10-CM | POA: Diagnosis not present

## 2018-02-22 DIAGNOSIS — E1122 Type 2 diabetes mellitus with diabetic chronic kidney disease: Secondary | ICD-10-CM | POA: Diagnosis not present

## 2018-02-22 DIAGNOSIS — S82421D Displaced transverse fracture of shaft of right fibula, subsequent encounter for closed fracture with routine healing: Secondary | ICD-10-CM | POA: Diagnosis not present

## 2018-02-22 DIAGNOSIS — S82221D Displaced transverse fracture of shaft of right tibia, subsequent encounter for closed fracture with routine healing: Secondary | ICD-10-CM | POA: Diagnosis not present

## 2018-02-22 DIAGNOSIS — I5032 Chronic diastolic (congestive) heart failure: Secondary | ICD-10-CM | POA: Diagnosis not present

## 2018-02-22 LAB — CBC WITH DIFFERENTIAL/PLATELET
BASOS ABS: 166 {cells}/uL (ref 0–200)
Basophils Relative: 2.1 %
EOS ABS: 237 {cells}/uL (ref 15–500)
EOS PCT: 3 %
HCT: 35.5 % (ref 35.0–45.0)
Hemoglobin: 11.8 g/dL (ref 11.7–15.5)
Lymphs Abs: 1446 cells/uL (ref 850–3900)
MCH: 30.6 pg (ref 27.0–33.0)
MCHC: 33.2 g/dL (ref 32.0–36.0)
MCV: 92.2 fL (ref 80.0–100.0)
MONOS PCT: 7.3 %
MPV: 9.7 fL (ref 7.5–12.5)
NEUTROS ABS: 5475 {cells}/uL (ref 1500–7800)
Neutrophils Relative %: 69.3 %
PLATELETS: 392 10*3/uL (ref 140–400)
RBC: 3.85 10*6/uL (ref 3.80–5.10)
RDW: 13.5 % (ref 11.0–15.0)
TOTAL LYMPHOCYTE: 18.3 %
WBC mixed population: 577 cells/uL (ref 200–950)
WBC: 7.9 10*3/uL (ref 3.8–10.8)

## 2018-02-22 LAB — BASIC METABOLIC PANEL WITH GFR
BUN/Creatinine Ratio: 10 (calc) (ref 6–22)
BUN: 15 mg/dL (ref 7–25)
CO2: 27 mmol/L (ref 20–32)
CREATININE: 1.44 mg/dL — AB (ref 0.60–0.88)
Calcium: 9.2 mg/dL (ref 8.6–10.4)
Chloride: 105 mmol/L (ref 98–110)
GFR, EST AFRICAN AMERICAN: 37 mL/min/{1.73_m2} — AB (ref >=60)
GFR, Est Non African American: 32 mL/min/{1.73_m2} — ABNORMAL LOW (ref >=60)
GLUCOSE: 180 mg/dL — AB (ref 65–99)
Potassium: 4.5 mmol/L (ref 3.5–5.3)
SODIUM: 141 mmol/L (ref 135–146)

## 2018-02-23 DIAGNOSIS — I5032 Chronic diastolic (congestive) heart failure: Secondary | ICD-10-CM | POA: Diagnosis not present

## 2018-02-23 DIAGNOSIS — I480 Paroxysmal atrial fibrillation: Secondary | ICD-10-CM | POA: Diagnosis not present

## 2018-02-23 DIAGNOSIS — S82221D Displaced transverse fracture of shaft of right tibia, subsequent encounter for closed fracture with routine healing: Secondary | ICD-10-CM | POA: Diagnosis not present

## 2018-02-23 DIAGNOSIS — S82421D Displaced transverse fracture of shaft of right fibula, subsequent encounter for closed fracture with routine healing: Secondary | ICD-10-CM | POA: Diagnosis not present

## 2018-02-23 DIAGNOSIS — M150 Primary generalized (osteo)arthritis: Secondary | ICD-10-CM | POA: Diagnosis not present

## 2018-02-23 DIAGNOSIS — M810 Age-related osteoporosis without current pathological fracture: Secondary | ICD-10-CM | POA: Diagnosis not present

## 2018-02-23 DIAGNOSIS — E1122 Type 2 diabetes mellitus with diabetic chronic kidney disease: Secondary | ICD-10-CM | POA: Diagnosis not present

## 2018-02-23 DIAGNOSIS — I130 Hypertensive heart and chronic kidney disease with heart failure and stage 1 through stage 4 chronic kidney disease, or unspecified chronic kidney disease: Secondary | ICD-10-CM | POA: Diagnosis not present

## 2018-02-23 DIAGNOSIS — N183 Chronic kidney disease, stage 3 (moderate): Secondary | ICD-10-CM | POA: Diagnosis not present

## 2018-02-23 DIAGNOSIS — S82001D Unspecified fracture of right patella, subsequent encounter for closed fracture with routine healing: Secondary | ICD-10-CM | POA: Diagnosis not present

## 2018-02-23 DIAGNOSIS — E1140 Type 2 diabetes mellitus with diabetic neuropathy, unspecified: Secondary | ICD-10-CM | POA: Diagnosis not present

## 2018-02-23 DIAGNOSIS — S82101D Unspecified fracture of upper end of right tibia, subsequent encounter for closed fracture with routine healing: Secondary | ICD-10-CM | POA: Diagnosis not present

## 2018-02-23 DIAGNOSIS — I2510 Atherosclerotic heart disease of native coronary artery without angina pectoris: Secondary | ICD-10-CM | POA: Diagnosis not present

## 2018-02-24 ENCOUNTER — Other Ambulatory Visit: Payer: Self-pay | Admitting: Family Medicine

## 2018-02-24 DIAGNOSIS — M00861 Arthritis due to other bacteria, right knee: Secondary | ICD-10-CM | POA: Diagnosis not present

## 2018-02-24 DIAGNOSIS — E1140 Type 2 diabetes mellitus with diabetic neuropathy, unspecified: Secondary | ICD-10-CM | POA: Diagnosis not present

## 2018-02-24 DIAGNOSIS — I130 Hypertensive heart and chronic kidney disease with heart failure and stage 1 through stage 4 chronic kidney disease, or unspecified chronic kidney disease: Secondary | ICD-10-CM | POA: Diagnosis not present

## 2018-02-24 DIAGNOSIS — M810 Age-related osteoporosis without current pathological fracture: Secondary | ICD-10-CM | POA: Diagnosis not present

## 2018-02-24 DIAGNOSIS — S82101D Unspecified fracture of upper end of right tibia, subsequent encounter for closed fracture with routine healing: Secondary | ICD-10-CM | POA: Diagnosis not present

## 2018-02-24 DIAGNOSIS — S82221D Displaced transverse fracture of shaft of right tibia, subsequent encounter for closed fracture with routine healing: Secondary | ICD-10-CM | POA: Diagnosis not present

## 2018-02-24 DIAGNOSIS — T8453XD Infection and inflammatory reaction due to internal right knee prosthesis, subsequent encounter: Secondary | ICD-10-CM | POA: Diagnosis not present

## 2018-02-24 DIAGNOSIS — T8450XD Infection and inflammatory reaction due to unspecified internal joint prosthesis, subsequent encounter: Secondary | ICD-10-CM | POA: Diagnosis not present

## 2018-02-24 DIAGNOSIS — I5032 Chronic diastolic (congestive) heart failure: Secondary | ICD-10-CM | POA: Diagnosis not present

## 2018-02-24 DIAGNOSIS — E1122 Type 2 diabetes mellitus with diabetic chronic kidney disease: Secondary | ICD-10-CM | POA: Diagnosis not present

## 2018-02-24 DIAGNOSIS — N183 Chronic kidney disease, stage 3 (moderate): Secondary | ICD-10-CM | POA: Diagnosis not present

## 2018-02-24 DIAGNOSIS — B9689 Other specified bacterial agents as the cause of diseases classified elsewhere: Secondary | ICD-10-CM | POA: Diagnosis not present

## 2018-02-24 DIAGNOSIS — S82421D Displaced transverse fracture of shaft of right fibula, subsequent encounter for closed fracture with routine healing: Secondary | ICD-10-CM | POA: Diagnosis not present

## 2018-02-24 DIAGNOSIS — I2510 Atherosclerotic heart disease of native coronary artery without angina pectoris: Secondary | ICD-10-CM | POA: Diagnosis not present

## 2018-02-24 DIAGNOSIS — S82001D Unspecified fracture of right patella, subsequent encounter for closed fracture with routine healing: Secondary | ICD-10-CM | POA: Diagnosis not present

## 2018-02-24 DIAGNOSIS — I480 Paroxysmal atrial fibrillation: Secondary | ICD-10-CM | POA: Diagnosis not present

## 2018-02-24 DIAGNOSIS — M150 Primary generalized (osteo)arthritis: Secondary | ICD-10-CM | POA: Diagnosis not present

## 2018-02-26 LAB — STOOL CULTURE
MICRO NUMBER:: 90259535
MICRO NUMBER:: 90259536
MICRO NUMBER:: 90259537
SHIGA RESULT: NOT DETECTED
SPECIMEN QUALITY: ADEQUATE
SPECIMEN QUALITY:: ADEQUATE
SPECIMEN QUALITY:: ADEQUATE

## 2018-02-27 ENCOUNTER — Other Ambulatory Visit: Payer: Self-pay | Admitting: Family Medicine

## 2018-02-27 DIAGNOSIS — M810 Age-related osteoporosis without current pathological fracture: Secondary | ICD-10-CM | POA: Diagnosis not present

## 2018-02-27 DIAGNOSIS — E1122 Type 2 diabetes mellitus with diabetic chronic kidney disease: Secondary | ICD-10-CM | POA: Diagnosis not present

## 2018-02-27 DIAGNOSIS — I130 Hypertensive heart and chronic kidney disease with heart failure and stage 1 through stage 4 chronic kidney disease, or unspecified chronic kidney disease: Secondary | ICD-10-CM | POA: Diagnosis not present

## 2018-02-27 DIAGNOSIS — M150 Primary generalized (osteo)arthritis: Secondary | ICD-10-CM | POA: Diagnosis not present

## 2018-02-27 DIAGNOSIS — S82221D Displaced transverse fracture of shaft of right tibia, subsequent encounter for closed fracture with routine healing: Secondary | ICD-10-CM | POA: Diagnosis not present

## 2018-02-27 DIAGNOSIS — S82421D Displaced transverse fracture of shaft of right fibula, subsequent encounter for closed fracture with routine healing: Secondary | ICD-10-CM | POA: Diagnosis not present

## 2018-02-27 DIAGNOSIS — S82001D Unspecified fracture of right patella, subsequent encounter for closed fracture with routine healing: Secondary | ICD-10-CM | POA: Diagnosis not present

## 2018-02-27 DIAGNOSIS — N183 Chronic kidney disease, stage 3 (moderate): Secondary | ICD-10-CM | POA: Diagnosis not present

## 2018-02-27 DIAGNOSIS — S82101D Unspecified fracture of upper end of right tibia, subsequent encounter for closed fracture with routine healing: Secondary | ICD-10-CM | POA: Diagnosis not present

## 2018-02-27 DIAGNOSIS — I5032 Chronic diastolic (congestive) heart failure: Secondary | ICD-10-CM | POA: Diagnosis not present

## 2018-02-27 DIAGNOSIS — E1140 Type 2 diabetes mellitus with diabetic neuropathy, unspecified: Secondary | ICD-10-CM | POA: Diagnosis not present

## 2018-02-27 DIAGNOSIS — I480 Paroxysmal atrial fibrillation: Secondary | ICD-10-CM | POA: Diagnosis not present

## 2018-02-27 DIAGNOSIS — I2510 Atherosclerotic heart disease of native coronary artery without angina pectoris: Secondary | ICD-10-CM | POA: Diagnosis not present

## 2018-02-27 LAB — CLOSTRIDIUM DIFFICILE TOXIN B, QUALITATIVE, REAL-TIME PCR: CDIFFPCR: NOT DETECTED

## 2018-02-27 LAB — BASIC METABOLIC PANEL WITH GFR

## 2018-02-27 LAB — CBC WITH DIFFERENTIAL/PLATELET

## 2018-02-28 ENCOUNTER — Ambulatory Visit (INDEPENDENT_AMBULATORY_CARE_PROVIDER_SITE_OTHER): Payer: Medicare Other | Admitting: Pharmacist

## 2018-02-28 DIAGNOSIS — S82221D Displaced transverse fracture of shaft of right tibia, subsequent encounter for closed fracture with routine healing: Secondary | ICD-10-CM | POA: Diagnosis not present

## 2018-02-28 DIAGNOSIS — M810 Age-related osteoporosis without current pathological fracture: Secondary | ICD-10-CM | POA: Diagnosis not present

## 2018-02-28 DIAGNOSIS — E1140 Type 2 diabetes mellitus with diabetic neuropathy, unspecified: Secondary | ICD-10-CM | POA: Diagnosis not present

## 2018-02-28 DIAGNOSIS — Z7901 Long term (current) use of anticoagulants: Secondary | ICD-10-CM | POA: Diagnosis not present

## 2018-02-28 DIAGNOSIS — E1122 Type 2 diabetes mellitus with diabetic chronic kidney disease: Secondary | ICD-10-CM | POA: Diagnosis not present

## 2018-02-28 DIAGNOSIS — M150 Primary generalized (osteo)arthritis: Secondary | ICD-10-CM | POA: Diagnosis not present

## 2018-02-28 DIAGNOSIS — I480 Paroxysmal atrial fibrillation: Secondary | ICD-10-CM | POA: Diagnosis not present

## 2018-02-28 DIAGNOSIS — S82001D Unspecified fracture of right patella, subsequent encounter for closed fracture with routine healing: Secondary | ICD-10-CM | POA: Diagnosis not present

## 2018-02-28 DIAGNOSIS — I130 Hypertensive heart and chronic kidney disease with heart failure and stage 1 through stage 4 chronic kidney disease, or unspecified chronic kidney disease: Secondary | ICD-10-CM | POA: Diagnosis not present

## 2018-02-28 DIAGNOSIS — S82421D Displaced transverse fracture of shaft of right fibula, subsequent encounter for closed fracture with routine healing: Secondary | ICD-10-CM | POA: Diagnosis not present

## 2018-02-28 DIAGNOSIS — S82101D Unspecified fracture of upper end of right tibia, subsequent encounter for closed fracture with routine healing: Secondary | ICD-10-CM | POA: Diagnosis not present

## 2018-02-28 DIAGNOSIS — I2510 Atherosclerotic heart disease of native coronary artery without angina pectoris: Secondary | ICD-10-CM | POA: Diagnosis not present

## 2018-02-28 DIAGNOSIS — I5032 Chronic diastolic (congestive) heart failure: Secondary | ICD-10-CM | POA: Diagnosis not present

## 2018-02-28 DIAGNOSIS — N183 Chronic kidney disease, stage 3 (moderate): Secondary | ICD-10-CM | POA: Diagnosis not present

## 2018-02-28 LAB — POCT INR: INR: 2

## 2018-03-01 DIAGNOSIS — G8929 Other chronic pain: Secondary | ICD-10-CM | POA: Diagnosis not present

## 2018-03-02 DIAGNOSIS — M810 Age-related osteoporosis without current pathological fracture: Secondary | ICD-10-CM | POA: Diagnosis not present

## 2018-03-02 DIAGNOSIS — S82101D Unspecified fracture of upper end of right tibia, subsequent encounter for closed fracture with routine healing: Secondary | ICD-10-CM | POA: Diagnosis not present

## 2018-03-02 DIAGNOSIS — M150 Primary generalized (osteo)arthritis: Secondary | ICD-10-CM | POA: Diagnosis not present

## 2018-03-02 DIAGNOSIS — I5032 Chronic diastolic (congestive) heart failure: Secondary | ICD-10-CM | POA: Diagnosis not present

## 2018-03-02 DIAGNOSIS — E1122 Type 2 diabetes mellitus with diabetic chronic kidney disease: Secondary | ICD-10-CM | POA: Diagnosis not present

## 2018-03-02 DIAGNOSIS — E1140 Type 2 diabetes mellitus with diabetic neuropathy, unspecified: Secondary | ICD-10-CM | POA: Diagnosis not present

## 2018-03-02 DIAGNOSIS — I2510 Atherosclerotic heart disease of native coronary artery without angina pectoris: Secondary | ICD-10-CM | POA: Diagnosis not present

## 2018-03-02 DIAGNOSIS — I480 Paroxysmal atrial fibrillation: Secondary | ICD-10-CM | POA: Diagnosis not present

## 2018-03-02 DIAGNOSIS — N183 Chronic kidney disease, stage 3 (moderate): Secondary | ICD-10-CM | POA: Diagnosis not present

## 2018-03-02 DIAGNOSIS — S82421D Displaced transverse fracture of shaft of right fibula, subsequent encounter for closed fracture with routine healing: Secondary | ICD-10-CM | POA: Diagnosis not present

## 2018-03-02 DIAGNOSIS — S82001D Unspecified fracture of right patella, subsequent encounter for closed fracture with routine healing: Secondary | ICD-10-CM | POA: Diagnosis not present

## 2018-03-02 DIAGNOSIS — I130 Hypertensive heart and chronic kidney disease with heart failure and stage 1 through stage 4 chronic kidney disease, or unspecified chronic kidney disease: Secondary | ICD-10-CM | POA: Diagnosis not present

## 2018-03-02 DIAGNOSIS — S82221D Displaced transverse fracture of shaft of right tibia, subsequent encounter for closed fracture with routine healing: Secondary | ICD-10-CM | POA: Diagnosis not present

## 2018-03-06 DIAGNOSIS — E1122 Type 2 diabetes mellitus with diabetic chronic kidney disease: Secondary | ICD-10-CM | POA: Diagnosis not present

## 2018-03-06 DIAGNOSIS — I480 Paroxysmal atrial fibrillation: Secondary | ICD-10-CM | POA: Diagnosis not present

## 2018-03-06 DIAGNOSIS — I5032 Chronic diastolic (congestive) heart failure: Secondary | ICD-10-CM | POA: Diagnosis not present

## 2018-03-06 DIAGNOSIS — E1140 Type 2 diabetes mellitus with diabetic neuropathy, unspecified: Secondary | ICD-10-CM | POA: Diagnosis not present

## 2018-03-06 DIAGNOSIS — S82221D Displaced transverse fracture of shaft of right tibia, subsequent encounter for closed fracture with routine healing: Secondary | ICD-10-CM | POA: Diagnosis not present

## 2018-03-06 DIAGNOSIS — S82101D Unspecified fracture of upper end of right tibia, subsequent encounter for closed fracture with routine healing: Secondary | ICD-10-CM | POA: Diagnosis not present

## 2018-03-06 DIAGNOSIS — S82421D Displaced transverse fracture of shaft of right fibula, subsequent encounter for closed fracture with routine healing: Secondary | ICD-10-CM | POA: Diagnosis not present

## 2018-03-06 DIAGNOSIS — R0902 Hypoxemia: Secondary | ICD-10-CM | POA: Diagnosis not present

## 2018-03-06 DIAGNOSIS — S82001D Unspecified fracture of right patella, subsequent encounter for closed fracture with routine healing: Secondary | ICD-10-CM | POA: Diagnosis not present

## 2018-03-06 DIAGNOSIS — I2510 Atherosclerotic heart disease of native coronary artery without angina pectoris: Secondary | ICD-10-CM | POA: Diagnosis not present

## 2018-03-06 DIAGNOSIS — M810 Age-related osteoporosis without current pathological fracture: Secondary | ICD-10-CM | POA: Diagnosis not present

## 2018-03-06 DIAGNOSIS — J449 Chronic obstructive pulmonary disease, unspecified: Secondary | ICD-10-CM | POA: Diagnosis not present

## 2018-03-06 DIAGNOSIS — N183 Chronic kidney disease, stage 3 (moderate): Secondary | ICD-10-CM | POA: Diagnosis not present

## 2018-03-06 DIAGNOSIS — M150 Primary generalized (osteo)arthritis: Secondary | ICD-10-CM | POA: Diagnosis not present

## 2018-03-06 DIAGNOSIS — I130 Hypertensive heart and chronic kidney disease with heart failure and stage 1 through stage 4 chronic kidney disease, or unspecified chronic kidney disease: Secondary | ICD-10-CM | POA: Diagnosis not present

## 2018-03-07 ENCOUNTER — Ambulatory Visit (INDEPENDENT_AMBULATORY_CARE_PROVIDER_SITE_OTHER): Payer: Medicare Other | Admitting: Pharmacist Clinician (PhC)/ Clinical Pharmacy Specialist

## 2018-03-07 DIAGNOSIS — M810 Age-related osteoporosis without current pathological fracture: Secondary | ICD-10-CM | POA: Diagnosis not present

## 2018-03-07 DIAGNOSIS — E1140 Type 2 diabetes mellitus with diabetic neuropathy, unspecified: Secondary | ICD-10-CM | POA: Diagnosis not present

## 2018-03-07 DIAGNOSIS — M150 Primary generalized (osteo)arthritis: Secondary | ICD-10-CM | POA: Diagnosis not present

## 2018-03-07 DIAGNOSIS — I480 Paroxysmal atrial fibrillation: Secondary | ICD-10-CM | POA: Diagnosis not present

## 2018-03-07 DIAGNOSIS — I5032 Chronic diastolic (congestive) heart failure: Secondary | ICD-10-CM | POA: Diagnosis not present

## 2018-03-07 DIAGNOSIS — Z7901 Long term (current) use of anticoagulants: Secondary | ICD-10-CM

## 2018-03-07 DIAGNOSIS — E1122 Type 2 diabetes mellitus with diabetic chronic kidney disease: Secondary | ICD-10-CM | POA: Diagnosis not present

## 2018-03-07 DIAGNOSIS — S82101D Unspecified fracture of upper end of right tibia, subsequent encounter for closed fracture with routine healing: Secondary | ICD-10-CM | POA: Diagnosis not present

## 2018-03-07 DIAGNOSIS — S82001D Unspecified fracture of right patella, subsequent encounter for closed fracture with routine healing: Secondary | ICD-10-CM | POA: Diagnosis not present

## 2018-03-07 DIAGNOSIS — N183 Chronic kidney disease, stage 3 (moderate): Secondary | ICD-10-CM | POA: Diagnosis not present

## 2018-03-07 DIAGNOSIS — I2510 Atherosclerotic heart disease of native coronary artery without angina pectoris: Secondary | ICD-10-CM | POA: Diagnosis not present

## 2018-03-07 DIAGNOSIS — I130 Hypertensive heart and chronic kidney disease with heart failure and stage 1 through stage 4 chronic kidney disease, or unspecified chronic kidney disease: Secondary | ICD-10-CM | POA: Diagnosis not present

## 2018-03-07 DIAGNOSIS — S82421D Displaced transverse fracture of shaft of right fibula, subsequent encounter for closed fracture with routine healing: Secondary | ICD-10-CM | POA: Diagnosis not present

## 2018-03-07 DIAGNOSIS — S82221D Displaced transverse fracture of shaft of right tibia, subsequent encounter for closed fracture with routine healing: Secondary | ICD-10-CM | POA: Diagnosis not present

## 2018-03-07 LAB — PROTIME-INR: INR: 2 — AB (ref 0.9–1.1)

## 2018-03-08 ENCOUNTER — Encounter: Payer: Self-pay | Admitting: Family Medicine

## 2018-03-08 ENCOUNTER — Ambulatory Visit (INDEPENDENT_AMBULATORY_CARE_PROVIDER_SITE_OTHER): Payer: Medicare Other | Admitting: Family Medicine

## 2018-03-08 VITALS — BP 131/53 | HR 62 | Wt 196.0 lb

## 2018-03-08 DIAGNOSIS — R197 Diarrhea, unspecified: Secondary | ICD-10-CM

## 2018-03-08 DIAGNOSIS — Z794 Long term (current) use of insulin: Secondary | ICD-10-CM

## 2018-03-08 DIAGNOSIS — R600 Localized edema: Secondary | ICD-10-CM

## 2018-03-08 DIAGNOSIS — E1143 Type 2 diabetes mellitus with diabetic autonomic (poly)neuropathy: Secondary | ICD-10-CM

## 2018-03-08 NOTE — Progress Notes (Signed)
Subjective:    Patient ID: [name], female    DOB: 02-Jun-1928, 84 y.o.   MRN: [id number]  HPI 84 year old female comes in today to follow-up on persistent diarrhea.  Her symptoms finally resolved.  We have never found a specific cause stool cultures and C. difficile testing were all negative.  But she is much better and her daughter who is here with her today says that she is almost back to baseline.  She is not sleeping as much during the day.  She has more energy.  She still getting some intermittent confusion but not nearly as much.  Her sugars had started to increase because she was eating normally again and so they have restarted her evening insulin at 18 units.  She takes 20 in the morning.  Diabetes-her blood sugars have started going back up since the diarrhea resolved.  But she actually had a blood sugar in the 60s today and in the 80s yesterday.  She is back on 18 units in the evening.  She says the swelling in her legs is doing okay.  Some days it is little bit more puffy and shiny but otherwise is doing okay.  Review of Systems  BP (!) 131/53   Pulse 62   Wt 196 lb (88.9 kg)   SpO2 98%   BMI 30.70 kg/m     Allergies  Allergen Reactions  . Angiotensin Receptor Blockers Other (See Comments)    Renal dx.  Nephrology has recommended against use bc of hyperkalemia Other reaction(s): Other (See Comments) Renal dx.  Nephrology has recommended against use bc of hyperkalemia  . Ace Inhibitors     REACTION: cough  . Fosinopril Sodium     REACTION: Cough  . Glipizide Other (See Comments)    Hypoglycemia  . Metformin And Related Other (See Comments)    Renal function  . Sulfamethoxazole-Trimethoprim     REACTION: Unspecified    Past Medical History:  Diagnosis Date  . Anemia    Pernicious  . Atrial fibrillation ([place]) 1/11    Dr Gwenlyn Found  . Back pain   . Chronic anticoagulation    on coumadin  . Chronic kidney disease    chronic, stage III  . Complete heart  block ([place]) 01/31/2015  . Cystitis   . Diabetes mellitus    w/neuro manifestations, type II  . Diarrhea   . Esophageal stricture   . GERD (gastroesophageal reflux disease)   . H/O cardiac catheterization 2010   normal coronary arteries, EF 60%  . Heart failure    chronic diatolic- 2 D echo 4/19 EF 60%  . Heart murmur   . Hiatal hernia   . History of kidney stones   . Hx of echocardiogram 04/04/13   EF 55-60%, mild MR  . Hyperlipidemia   . Hypertension   . IBS (irritable bowel syndrome)   . Memory loss   . Microalbuminuria   . Non-stress test nonreactive 11/14   Lexiscan myoview negative for ischemia  . Osteoarthrosis involving multiple sites   . PAF (paroxysmal atrial fibrillation) ([place])    Last DCCV 12/29/2009  . Pancreatitis   . Peptic ulcer    unspec. w/o obstruction  . RBBB   . Senile osteoporosis   . Swelling    limb, left more than right ankle   . Symptomatic bradycardia 01/2010   CHB, temp pacer and then Medtronic PPM  . Thyroid disease   . Tubular adenoma of colon   . Ulcer  Gastric    Past Surgical History:  Procedure Laterality Date  . APPENDECTOMY    . CARDIAC CATHETERIZATION  2010   normal coronary arteries  . carotid dopplers- no significant stenosis  2007  . CATARACT EXTRACTION     both eyes  . EGD- esophageal stricture     gastric ulcer  . pacemaker placement  2/11   medtronic dual chamber  . TONSILLECTOMY AND ADENOIDECTOMY      Social History   Socioeconomic History  . Marital status: Widowed    Spouse name: Not on file  . Number of children: Not on file  . Years of education: Not on file  . Highest education level: Not on file  Social Needs  . Financial resource strain: Not on file  . Food insecurity - worry: Not on file  . Food insecurity - inability: Not on file  . Transportation needs - medical: Not on file  . Transportation needs - non-medical: Not on file  Occupational History  . Not on file  Tobacco Use  . Smoking status:  Never Smoker  . Smokeless tobacco: Never Used  Substance and Sexual Activity  . Alcohol use: No  . Drug use: No  . Sexual activity: No  Other Topics Concern  . Not on file  Social History Narrative  . Not on file    Family History  Problem Relation Age of Onset  . Diabetes Mother   . Prostate cancer Father   . Heart attack Father   . Colon cancer Sister   . Esophageal cancer Neg Hx   . Rectal cancer Neg Hx   . Stomach cancer Neg Hx   . Alzheimer's disease Sister   . Heart disease Brother     Outpatient Encounter Medications as of 03/08/2018  Medication Sig  . ACCU-CHEK COMPACT PLUS test strip USE TO CHECK BLOOD SUGAR TWICE DAILY  . AMBULATORY NON FORMULARY MEDICATION Medication Name: [facility].  Attention Tabatha.  Need BMP on Ms. Ostermann time of the week of February 18.  Diagnosis: medication management, lower extremity edema  . AMBULATORY NON FORMULARY MEDICATION Medication Name: [facility] to do an overnight pulse oximetry.  Diagnosis of shortness of breath.  Marland Kitchen AQUALANCE LANCETS 30G MISC   . ascorbic acid (VITAMIN C) 500 MG tablet Take 500 mg by mouth 3 (three) times daily with meals.  Marland Kitchen b complex vitamins capsule Take 1 capsule by mouth daily.  . bifidobacterium infantis (ALIGN) capsule 1 capsule by mouth daily  . Blood Glucose Calibration (OT ULTRA/FASTTK CNTRL SOLN) SOLN   . calcium citrate (CALCITRATE - DOSED IN MG ELEMENTAL CALCIUM) 950 MG tablet Take 950 mg by mouth daily.  . clotrimazole (LOTRIMIN) 1 % cream Apply 1 application topically 2 (two) times daily. Apply to affected areas twice daily  . donepezil (ARICEPT) 10 MG tablet Take 1 tablet (10 mg total) by mouth at bedtime.  . furosemide (LASIX) 40 MG tablet TAKE 1 TABLET (40 MG TOTAL) BY MOUTH DAILY AS NEEDED.  Marland Kitchen gabapentin (NEURONTIN) 600 MG tablet TAKE 1 TABLET BY MOUTH EVERY MORNING, 2 TABLETS AT DINNER, AND 3 TABLETS NIGHTLY AT BEDTIME.  Marland Kitchen HYDROcodone-acetaminophen  (NORCO/VICODIN) 5-325 MG tablet Take 1 tablet by mouth every 6 (six) hours as needed. Take with food  . Insulin Glargine (LANTUS SOLOSTAR) 100 UNIT/ML Solostar Pen INJECT 28 UNITS INTO THE SKIN 2 (TWO) TIMES DAILY.  Marland Kitchen insulin regular (HUMULIN R) 100 units/mL injection 5 units SQ 10  minutes before evening meal.  . Insulin Syringe-Needle U-100 (EASY COMFORT INSULIN SYRINGE) 30G X 5/16" 0.5 ML MISC Use to inject insulin. Dx:E  . JANUVIA 100 MG tablet Take 1 tablet by mouth daily.  Marland Kitchen levothyroxine (SYNTHROID, LEVOTHROID) 100 MCG tablet TAKE ONE TABLET BY MOUTH DAILY  . losartan (COZAAR) 25 MG tablet Take 1 tablet (25 mg total) by mouth daily.  . memantine (NAMENDA) 5 MG tablet TAKE 1 TABLET BY MOUTH AT BEDTIME  . metoprolol tartrate (LOPRESSOR) 25 MG tablet TAKE 1 TABLET (25 MG TOTAL) BY MOUTH 2 (TWO) TIMES DAILY.  . Multiple Vitamins-Minerals (CERTAVITE SENIOR/ANTIOXIDANT) TABS Take 1 tablet by mouth daily.  . pantoprazole (PROTONIX) 40 MG tablet TAKE 1 TABLET (40 MG TOTAL) BY MOUTH DAILY.  Marland Kitchen penicillin v potassium (VEETID) 250 MG tablet Take 250 mg by mouth 3 (three) times daily.  . pioglitazone (ACTOS) 15 MG tablet TAKE 1 TABLET BY MOUTH EVERY DAY  . simvastatin (ZOCOR) 40 MG tablet Take 1/2 tab at bedtime.  Marland Kitchen warfarin (COUMADIN) 2 MG tablet Take 1 tablet by mouth daily as needed.  . warfarin (COUMADIN) 3 MG tablet Take 1 tablet by mouth daily as needed.  . zinc oxide 20 % ointment Apply 1 application topically 2 (two) times daily as needed for irritation (to external rectum and buttocks).  . [DISCONTINUED] nitrofurantoin, macrocrystal-monohydrate, (MACROBID) 100 MG capsule Take 1 capsule (100 mg total) by mouth 2 (two) times daily.   No facility-administered encounter medications on file as of 03/08/2018.          Objective:   Physical Exam  Constitutional: She is oriented to person, place, and time. She appears well-developed and well-nourished.  HENT:  Head: Normocephalic and  atraumatic.  Cardiovascular: Normal rate, regular rhythm and normal heart sounds.  Pulmonary/Chest: Effort normal and breath sounds normal.  Neurological: She is alert and oriented to person, place, and time.  Skin: Skin is warm and dry.  Psychiatric: She has a normal mood and affect. Her behavior is normal.  legs: Trace edema of ankles bilaterally.      Assessment & Plan:  Diarrhea-resolved.  Fortunately she is feeling much better.  Suspect that it was some type of viral illness.  Encouraged her to drink more water on a daily basis.    Diabetes-decrease Lantus down to 14 units at night since she is still having some borderline low sugars.  Her daughter can call me in a week or 2 and let me know if needed adjust it back up.  For follow-up appointment in May.  Chronic lower extremity edema-present but more back to baseline.  We will continue to monitor carefully.

## 2018-03-08 NOTE — Patient Instructions (Signed)
Decrease nighttime insulin dose to 14 units.

## 2018-03-09 DIAGNOSIS — E1140 Type 2 diabetes mellitus with diabetic neuropathy, unspecified: Secondary | ICD-10-CM | POA: Diagnosis not present

## 2018-03-09 DIAGNOSIS — I5032 Chronic diastolic (congestive) heart failure: Secondary | ICD-10-CM | POA: Diagnosis not present

## 2018-03-09 DIAGNOSIS — E1122 Type 2 diabetes mellitus with diabetic chronic kidney disease: Secondary | ICD-10-CM | POA: Diagnosis not present

## 2018-03-09 DIAGNOSIS — S82221D Displaced transverse fracture of shaft of right tibia, subsequent encounter for closed fracture with routine healing: Secondary | ICD-10-CM | POA: Diagnosis not present

## 2018-03-09 DIAGNOSIS — M150 Primary generalized (osteo)arthritis: Secondary | ICD-10-CM | POA: Diagnosis not present

## 2018-03-09 DIAGNOSIS — I480 Paroxysmal atrial fibrillation: Secondary | ICD-10-CM | POA: Diagnosis not present

## 2018-03-09 DIAGNOSIS — S82101D Unspecified fracture of upper end of right tibia, subsequent encounter for closed fracture with routine healing: Secondary | ICD-10-CM | POA: Diagnosis not present

## 2018-03-09 DIAGNOSIS — M810 Age-related osteoporosis without current pathological fracture: Secondary | ICD-10-CM | POA: Diagnosis not present

## 2018-03-09 DIAGNOSIS — I130 Hypertensive heart and chronic kidney disease with heart failure and stage 1 through stage 4 chronic kidney disease, or unspecified chronic kidney disease: Secondary | ICD-10-CM | POA: Diagnosis not present

## 2018-03-09 DIAGNOSIS — I2510 Atherosclerotic heart disease of native coronary artery without angina pectoris: Secondary | ICD-10-CM | POA: Diagnosis not present

## 2018-03-09 DIAGNOSIS — S82001D Unspecified fracture of right patella, subsequent encounter for closed fracture with routine healing: Secondary | ICD-10-CM | POA: Diagnosis not present

## 2018-03-09 DIAGNOSIS — S82421D Displaced transverse fracture of shaft of right fibula, subsequent encounter for closed fracture with routine healing: Secondary | ICD-10-CM | POA: Diagnosis not present

## 2018-03-09 DIAGNOSIS — N183 Chronic kidney disease, stage 3 (moderate): Secondary | ICD-10-CM | POA: Diagnosis not present

## 2018-03-10 DIAGNOSIS — N183 Chronic kidney disease, stage 3 (moderate): Secondary | ICD-10-CM | POA: Diagnosis not present

## 2018-03-10 DIAGNOSIS — I130 Hypertensive heart and chronic kidney disease with heart failure and stage 1 through stage 4 chronic kidney disease, or unspecified chronic kidney disease: Secondary | ICD-10-CM | POA: Diagnosis not present

## 2018-03-10 DIAGNOSIS — S82421D Displaced transverse fracture of shaft of right fibula, subsequent encounter for closed fracture with routine healing: Secondary | ICD-10-CM | POA: Diagnosis not present

## 2018-03-10 DIAGNOSIS — I2510 Atherosclerotic heart disease of native coronary artery without angina pectoris: Secondary | ICD-10-CM | POA: Diagnosis not present

## 2018-03-10 DIAGNOSIS — M150 Primary generalized (osteo)arthritis: Secondary | ICD-10-CM | POA: Diagnosis not present

## 2018-03-10 DIAGNOSIS — S82101D Unspecified fracture of upper end of right tibia, subsequent encounter for closed fracture with routine healing: Secondary | ICD-10-CM | POA: Diagnosis not present

## 2018-03-10 DIAGNOSIS — I480 Paroxysmal atrial fibrillation: Secondary | ICD-10-CM | POA: Diagnosis not present

## 2018-03-10 DIAGNOSIS — E1122 Type 2 diabetes mellitus with diabetic chronic kidney disease: Secondary | ICD-10-CM | POA: Diagnosis not present

## 2018-03-10 DIAGNOSIS — M810 Age-related osteoporosis without current pathological fracture: Secondary | ICD-10-CM | POA: Diagnosis not present

## 2018-03-10 DIAGNOSIS — I5032 Chronic diastolic (congestive) heart failure: Secondary | ICD-10-CM | POA: Diagnosis not present

## 2018-03-10 DIAGNOSIS — E1140 Type 2 diabetes mellitus with diabetic neuropathy, unspecified: Secondary | ICD-10-CM | POA: Diagnosis not present

## 2018-03-10 DIAGNOSIS — S82221D Displaced transverse fracture of shaft of right tibia, subsequent encounter for closed fracture with routine healing: Secondary | ICD-10-CM | POA: Diagnosis not present

## 2018-03-10 DIAGNOSIS — S82001D Unspecified fracture of right patella, subsequent encounter for closed fracture with routine healing: Secondary | ICD-10-CM | POA: Diagnosis not present

## 2018-03-13 ENCOUNTER — Other Ambulatory Visit: Payer: Self-pay | Admitting: *Deleted

## 2018-03-13 DIAGNOSIS — S82001D Unspecified fracture of right patella, subsequent encounter for closed fracture with routine healing: Secondary | ICD-10-CM | POA: Diagnosis not present

## 2018-03-13 DIAGNOSIS — N183 Chronic kidney disease, stage 3 (moderate): Secondary | ICD-10-CM | POA: Diagnosis not present

## 2018-03-13 DIAGNOSIS — M810 Age-related osteoporosis without current pathological fracture: Secondary | ICD-10-CM | POA: Diagnosis not present

## 2018-03-13 DIAGNOSIS — S82221D Displaced transverse fracture of shaft of right tibia, subsequent encounter for closed fracture with routine healing: Secondary | ICD-10-CM | POA: Diagnosis not present

## 2018-03-13 DIAGNOSIS — E1140 Type 2 diabetes mellitus with diabetic neuropathy, unspecified: Secondary | ICD-10-CM | POA: Diagnosis not present

## 2018-03-13 DIAGNOSIS — M150 Primary generalized (osteo)arthritis: Secondary | ICD-10-CM | POA: Diagnosis not present

## 2018-03-13 DIAGNOSIS — E1122 Type 2 diabetes mellitus with diabetic chronic kidney disease: Secondary | ICD-10-CM | POA: Diagnosis not present

## 2018-03-13 DIAGNOSIS — I2510 Atherosclerotic heart disease of native coronary artery without angina pectoris: Secondary | ICD-10-CM | POA: Diagnosis not present

## 2018-03-13 DIAGNOSIS — I480 Paroxysmal atrial fibrillation: Secondary | ICD-10-CM | POA: Diagnosis not present

## 2018-03-13 DIAGNOSIS — S82421D Displaced transverse fracture of shaft of right fibula, subsequent encounter for closed fracture with routine healing: Secondary | ICD-10-CM | POA: Diagnosis not present

## 2018-03-13 DIAGNOSIS — I130 Hypertensive heart and chronic kidney disease with heart failure and stage 1 through stage 4 chronic kidney disease, or unspecified chronic kidney disease: Secondary | ICD-10-CM | POA: Diagnosis not present

## 2018-03-13 DIAGNOSIS — I5032 Chronic diastolic (congestive) heart failure: Secondary | ICD-10-CM | POA: Diagnosis not present

## 2018-03-13 DIAGNOSIS — S82101D Unspecified fracture of upper end of right tibia, subsequent encounter for closed fracture with routine healing: Secondary | ICD-10-CM | POA: Diagnosis not present

## 2018-03-13 MED ORDER — INSULIN GLARGINE 100 UNIT/ML SOLOSTAR PEN: PEN_INJECTOR | SUBCUTANEOUS | 3 refills | Status: DC

## 2018-03-14 ENCOUNTER — Telehealth: Payer: Self-pay

## 2018-03-14 DIAGNOSIS — I2510 Atherosclerotic heart disease of native coronary artery without angina pectoris: Secondary | ICD-10-CM | POA: Diagnosis not present

## 2018-03-14 DIAGNOSIS — N183 Chronic kidney disease, stage 3 (moderate): Secondary | ICD-10-CM | POA: Diagnosis not present

## 2018-03-14 DIAGNOSIS — I480 Paroxysmal atrial fibrillation: Secondary | ICD-10-CM | POA: Diagnosis not present

## 2018-03-14 DIAGNOSIS — E1122 Type 2 diabetes mellitus with diabetic chronic kidney disease: Secondary | ICD-10-CM | POA: Diagnosis not present

## 2018-03-14 DIAGNOSIS — S82101D Unspecified fracture of upper end of right tibia, subsequent encounter for closed fracture with routine healing: Secondary | ICD-10-CM | POA: Diagnosis not present

## 2018-03-14 DIAGNOSIS — M810 Age-related osteoporosis without current pathological fracture: Secondary | ICD-10-CM | POA: Diagnosis not present

## 2018-03-14 DIAGNOSIS — E1140 Type 2 diabetes mellitus with diabetic neuropathy, unspecified: Secondary | ICD-10-CM | POA: Diagnosis not present

## 2018-03-14 DIAGNOSIS — S82421D Displaced transverse fracture of shaft of right fibula, subsequent encounter for closed fracture with routine healing: Secondary | ICD-10-CM | POA: Diagnosis not present

## 2018-03-14 DIAGNOSIS — I130 Hypertensive heart and chronic kidney disease with heart failure and stage 1 through stage 4 chronic kidney disease, or unspecified chronic kidney disease: Secondary | ICD-10-CM | POA: Diagnosis not present

## 2018-03-14 DIAGNOSIS — M150 Primary generalized (osteo)arthritis: Secondary | ICD-10-CM | POA: Diagnosis not present

## 2018-03-14 DIAGNOSIS — I5032 Chronic diastolic (congestive) heart failure: Secondary | ICD-10-CM | POA: Diagnosis not present

## 2018-03-14 DIAGNOSIS — S82001D Unspecified fracture of right patella, subsequent encounter for closed fracture with routine healing: Secondary | ICD-10-CM | POA: Diagnosis not present

## 2018-03-14 DIAGNOSIS — S82221D Displaced transverse fracture of shaft of right tibia, subsequent encounter for closed fracture with routine healing: Secondary | ICD-10-CM | POA: Diagnosis not present

## 2018-03-14 NOTE — Telephone Encounter (Signed)
Home Health advised.  

## 2018-03-14 NOTE — Telephone Encounter (Signed)
To new current regimen with Coumadin since INR is 2.0.  Recommend recheck level in 2 weeks.

## 2018-03-14 NOTE — Telephone Encounter (Signed)
[name] with Home Health reports leg bruising. She has not had a change in leg swelling or pain. No weakness or new problems with walking. Last check of INR was 2.0. Denies injury to leg. No history of DVT.

## 2018-03-15 DIAGNOSIS — S82001D Unspecified fracture of right patella, subsequent encounter for closed fracture with routine healing: Secondary | ICD-10-CM | POA: Diagnosis not present

## 2018-03-15 DIAGNOSIS — I5032 Chronic diastolic (congestive) heart failure: Secondary | ICD-10-CM | POA: Diagnosis not present

## 2018-03-15 DIAGNOSIS — M810 Age-related osteoporosis without current pathological fracture: Secondary | ICD-10-CM | POA: Diagnosis not present

## 2018-03-15 DIAGNOSIS — S82221D Displaced transverse fracture of shaft of right tibia, subsequent encounter for closed fracture with routine healing: Secondary | ICD-10-CM | POA: Diagnosis not present

## 2018-03-15 DIAGNOSIS — S82101D Unspecified fracture of upper end of right tibia, subsequent encounter for closed fracture with routine healing: Secondary | ICD-10-CM | POA: Diagnosis not present

## 2018-03-15 DIAGNOSIS — I480 Paroxysmal atrial fibrillation: Secondary | ICD-10-CM | POA: Diagnosis not present

## 2018-03-15 DIAGNOSIS — M150 Primary generalized (osteo)arthritis: Secondary | ICD-10-CM | POA: Diagnosis not present

## 2018-03-15 DIAGNOSIS — I2510 Atherosclerotic heart disease of native coronary artery without angina pectoris: Secondary | ICD-10-CM | POA: Diagnosis not present

## 2018-03-15 DIAGNOSIS — N183 Chronic kidney disease, stage 3 (moderate): Secondary | ICD-10-CM | POA: Diagnosis not present

## 2018-03-15 DIAGNOSIS — I130 Hypertensive heart and chronic kidney disease with heart failure and stage 1 through stage 4 chronic kidney disease, or unspecified chronic kidney disease: Secondary | ICD-10-CM | POA: Diagnosis not present

## 2018-03-15 DIAGNOSIS — E1122 Type 2 diabetes mellitus with diabetic chronic kidney disease: Secondary | ICD-10-CM | POA: Diagnosis not present

## 2018-03-15 DIAGNOSIS — S82421D Displaced transverse fracture of shaft of right fibula, subsequent encounter for closed fracture with routine healing: Secondary | ICD-10-CM | POA: Diagnosis not present

## 2018-03-15 DIAGNOSIS — E1140 Type 2 diabetes mellitus with diabetic neuropathy, unspecified: Secondary | ICD-10-CM | POA: Diagnosis not present

## 2018-03-16 DIAGNOSIS — M810 Age-related osteoporosis without current pathological fracture: Secondary | ICD-10-CM | POA: Diagnosis not present

## 2018-03-16 DIAGNOSIS — S82101D Unspecified fracture of upper end of right tibia, subsequent encounter for closed fracture with routine healing: Secondary | ICD-10-CM | POA: Diagnosis not present

## 2018-03-16 DIAGNOSIS — M150 Primary generalized (osteo)arthritis: Secondary | ICD-10-CM | POA: Diagnosis not present

## 2018-03-16 DIAGNOSIS — E1140 Type 2 diabetes mellitus with diabetic neuropathy, unspecified: Secondary | ICD-10-CM | POA: Diagnosis not present

## 2018-03-16 DIAGNOSIS — S82221D Displaced transverse fracture of shaft of right tibia, subsequent encounter for closed fracture with routine healing: Secondary | ICD-10-CM | POA: Diagnosis not present

## 2018-03-16 DIAGNOSIS — I480 Paroxysmal atrial fibrillation: Secondary | ICD-10-CM | POA: Diagnosis not present

## 2018-03-16 DIAGNOSIS — I130 Hypertensive heart and chronic kidney disease with heart failure and stage 1 through stage 4 chronic kidney disease, or unspecified chronic kidney disease: Secondary | ICD-10-CM | POA: Diagnosis not present

## 2018-03-16 DIAGNOSIS — I5032 Chronic diastolic (congestive) heart failure: Secondary | ICD-10-CM | POA: Diagnosis not present

## 2018-03-16 DIAGNOSIS — I2510 Atherosclerotic heart disease of native coronary artery without angina pectoris: Secondary | ICD-10-CM | POA: Diagnosis not present

## 2018-03-16 DIAGNOSIS — E1122 Type 2 diabetes mellitus with diabetic chronic kidney disease: Secondary | ICD-10-CM | POA: Diagnosis not present

## 2018-03-16 DIAGNOSIS — S82421D Displaced transverse fracture of shaft of right fibula, subsequent encounter for closed fracture with routine healing: Secondary | ICD-10-CM | POA: Diagnosis not present

## 2018-03-16 DIAGNOSIS — S82001D Unspecified fracture of right patella, subsequent encounter for closed fracture with routine healing: Secondary | ICD-10-CM | POA: Diagnosis not present

## 2018-03-16 DIAGNOSIS — N183 Chronic kidney disease, stage 3 (moderate): Secondary | ICD-10-CM | POA: Diagnosis not present

## 2018-03-17 ENCOUNTER — Other Ambulatory Visit: Payer: Self-pay | Admitting: *Deleted

## 2018-03-18 ENCOUNTER — Other Ambulatory Visit: Payer: Self-pay | Admitting: Family Medicine

## 2018-03-20 ENCOUNTER — Telehealth: Payer: Self-pay

## 2018-03-20 DIAGNOSIS — E1140 Type 2 diabetes mellitus with diabetic neuropathy, unspecified: Secondary | ICD-10-CM | POA: Diagnosis not present

## 2018-03-20 DIAGNOSIS — S82421D Displaced transverse fracture of shaft of right fibula, subsequent encounter for closed fracture with routine healing: Secondary | ICD-10-CM | POA: Diagnosis not present

## 2018-03-20 DIAGNOSIS — S82101D Unspecified fracture of upper end of right tibia, subsequent encounter for closed fracture with routine healing: Secondary | ICD-10-CM | POA: Diagnosis not present

## 2018-03-20 DIAGNOSIS — M810 Age-related osteoporosis without current pathological fracture: Secondary | ICD-10-CM | POA: Diagnosis not present

## 2018-03-20 DIAGNOSIS — I130 Hypertensive heart and chronic kidney disease with heart failure and stage 1 through stage 4 chronic kidney disease, or unspecified chronic kidney disease: Secondary | ICD-10-CM | POA: Diagnosis not present

## 2018-03-20 DIAGNOSIS — M150 Primary generalized (osteo)arthritis: Secondary | ICD-10-CM | POA: Diagnosis not present

## 2018-03-20 DIAGNOSIS — I5032 Chronic diastolic (congestive) heart failure: Secondary | ICD-10-CM | POA: Diagnosis not present

## 2018-03-20 DIAGNOSIS — S82221D Displaced transverse fracture of shaft of right tibia, subsequent encounter for closed fracture with routine healing: Secondary | ICD-10-CM | POA: Diagnosis not present

## 2018-03-20 DIAGNOSIS — S82001D Unspecified fracture of right patella, subsequent encounter for closed fracture with routine healing: Secondary | ICD-10-CM | POA: Diagnosis not present

## 2018-03-20 DIAGNOSIS — I2510 Atherosclerotic heart disease of native coronary artery without angina pectoris: Secondary | ICD-10-CM | POA: Diagnosis not present

## 2018-03-20 DIAGNOSIS — E1122 Type 2 diabetes mellitus with diabetic chronic kidney disease: Secondary | ICD-10-CM | POA: Diagnosis not present

## 2018-03-20 DIAGNOSIS — N183 Chronic kidney disease, stage 3 (moderate): Secondary | ICD-10-CM | POA: Diagnosis not present

## 2018-03-20 DIAGNOSIS — I480 Paroxysmal atrial fibrillation: Secondary | ICD-10-CM | POA: Diagnosis not present

## 2018-03-20 NOTE — Telephone Encounter (Signed)
Please call [facility] and have them fax the results.

## 2018-03-20 NOTE — Telephone Encounter (Signed)
[name], pt's daughter, called wanting to know if Dr Madilyn Fireman received the results from pt's overnight oxygen saturation test done by [facility]? If so, she wants to know about results. Can you please look into this? Thanks!

## 2018-03-20 NOTE — Telephone Encounter (Signed)
Called [facility] at [phone] and spoke to respiratory therapist Ailene Ravel, who is faxing the report to our office now with attn to Dr Madilyn Fireman

## 2018-03-21 ENCOUNTER — Ambulatory Visit (INDEPENDENT_AMBULATORY_CARE_PROVIDER_SITE_OTHER): Payer: Medicare Other | Admitting: Pharmacist Clinician (PhC)/ Clinical Pharmacy Specialist

## 2018-03-21 DIAGNOSIS — M150 Primary generalized (osteo)arthritis: Secondary | ICD-10-CM | POA: Diagnosis not present

## 2018-03-21 DIAGNOSIS — I130 Hypertensive heart and chronic kidney disease with heart failure and stage 1 through stage 4 chronic kidney disease, or unspecified chronic kidney disease: Secondary | ICD-10-CM | POA: Diagnosis not present

## 2018-03-21 DIAGNOSIS — E1122 Type 2 diabetes mellitus with diabetic chronic kidney disease: Secondary | ICD-10-CM | POA: Diagnosis not present

## 2018-03-21 DIAGNOSIS — S82101D Unspecified fracture of upper end of right tibia, subsequent encounter for closed fracture with routine healing: Secondary | ICD-10-CM | POA: Diagnosis not present

## 2018-03-21 DIAGNOSIS — I2510 Atherosclerotic heart disease of native coronary artery without angina pectoris: Secondary | ICD-10-CM | POA: Diagnosis not present

## 2018-03-21 DIAGNOSIS — I480 Paroxysmal atrial fibrillation: Secondary | ICD-10-CM

## 2018-03-21 DIAGNOSIS — E1140 Type 2 diabetes mellitus with diabetic neuropathy, unspecified: Secondary | ICD-10-CM | POA: Diagnosis not present

## 2018-03-21 DIAGNOSIS — N183 Chronic kidney disease, stage 3 (moderate): Secondary | ICD-10-CM | POA: Diagnosis not present

## 2018-03-21 DIAGNOSIS — S82421D Displaced transverse fracture of shaft of right fibula, subsequent encounter for closed fracture with routine healing: Secondary | ICD-10-CM | POA: Diagnosis not present

## 2018-03-21 DIAGNOSIS — S82001D Unspecified fracture of right patella, subsequent encounter for closed fracture with routine healing: Secondary | ICD-10-CM | POA: Diagnosis not present

## 2018-03-21 DIAGNOSIS — Z7901 Long term (current) use of anticoagulants: Secondary | ICD-10-CM | POA: Diagnosis not present

## 2018-03-21 DIAGNOSIS — S82221D Displaced transverse fracture of shaft of right tibia, subsequent encounter for closed fracture with routine healing: Secondary | ICD-10-CM | POA: Diagnosis not present

## 2018-03-21 DIAGNOSIS — I5032 Chronic diastolic (congestive) heart failure: Secondary | ICD-10-CM | POA: Diagnosis not present

## 2018-03-21 DIAGNOSIS — M810 Age-related osteoporosis without current pathological fracture: Secondary | ICD-10-CM | POA: Diagnosis not present

## 2018-03-21 LAB — POCT INR: INR: 2

## 2018-03-22 ENCOUNTER — Other Ambulatory Visit: Payer: Self-pay

## 2018-03-22 ENCOUNTER — Other Ambulatory Visit: Payer: Self-pay | Admitting: *Deleted

## 2018-03-22 MED ORDER — AMBULATORY NON FORMULARY MEDICATION: 99 refills | Status: DC

## 2018-03-22 MED ORDER — AMBULATORY NON FORMULARY MEDICATION: 99 refills | Status: AC

## 2018-03-22 NOTE — Progress Notes (Signed)
[facility] requested the term "stationary gas" be added to Rx.

## 2018-03-22 NOTE — Telephone Encounter (Signed)
51, Yumiko's daughter advised.

## 2018-03-22 NOTE — Telephone Encounter (Signed)
Call patient: Sorry for the delay in getting the results back.  She does tend to drop her oxygen some at night based on the report.  If the oxygen dropped to 88% or less for more than 5 minutes consecutively and that does qualify her for oxygen.  She did drop less than 88 for greater than 10 minutes.  I would recommend that we place an order for home oxygen to be used overnight, 2 L.  Okay [facility] to [facility].

## 2018-03-22 NOTE — Telephone Encounter (Signed)
Informed [name] that I did not have O2 report.[name], [name]

## 2018-03-22 NOTE — Telephone Encounter (Signed)
[name],   Do you have to overnight O2 report?

## 2018-03-23 DIAGNOSIS — S82421D Displaced transverse fracture of shaft of right fibula, subsequent encounter for closed fracture with routine healing: Secondary | ICD-10-CM | POA: Diagnosis not present

## 2018-03-23 DIAGNOSIS — S82001D Unspecified fracture of right patella, subsequent encounter for closed fracture with routine healing: Secondary | ICD-10-CM | POA: Diagnosis not present

## 2018-03-23 DIAGNOSIS — M150 Primary generalized (osteo)arthritis: Secondary | ICD-10-CM | POA: Diagnosis not present

## 2018-03-23 DIAGNOSIS — S82101D Unspecified fracture of upper end of right tibia, subsequent encounter for closed fracture with routine healing: Secondary | ICD-10-CM | POA: Diagnosis not present

## 2018-03-23 DIAGNOSIS — E1140 Type 2 diabetes mellitus with diabetic neuropathy, unspecified: Secondary | ICD-10-CM | POA: Diagnosis not present

## 2018-03-23 DIAGNOSIS — I2510 Atherosclerotic heart disease of native coronary artery without angina pectoris: Secondary | ICD-10-CM | POA: Diagnosis not present

## 2018-03-23 DIAGNOSIS — N183 Chronic kidney disease, stage 3 (moderate): Secondary | ICD-10-CM | POA: Diagnosis not present

## 2018-03-23 DIAGNOSIS — I480 Paroxysmal atrial fibrillation: Secondary | ICD-10-CM | POA: Diagnosis not present

## 2018-03-23 DIAGNOSIS — I5032 Chronic diastolic (congestive) heart failure: Secondary | ICD-10-CM | POA: Diagnosis not present

## 2018-03-23 DIAGNOSIS — I5033 Acute on chronic diastolic (congestive) heart failure: Secondary | ICD-10-CM | POA: Diagnosis not present

## 2018-03-23 DIAGNOSIS — I130 Hypertensive heart and chronic kidney disease with heart failure and stage 1 through stage 4 chronic kidney disease, or unspecified chronic kidney disease: Secondary | ICD-10-CM | POA: Diagnosis not present

## 2018-03-23 DIAGNOSIS — S82221D Displaced transverse fracture of shaft of right tibia, subsequent encounter for closed fracture with routine healing: Secondary | ICD-10-CM | POA: Diagnosis not present

## 2018-03-23 DIAGNOSIS — M810 Age-related osteoporosis without current pathological fracture: Secondary | ICD-10-CM | POA: Diagnosis not present

## 2018-03-23 DIAGNOSIS — E1122 Type 2 diabetes mellitus with diabetic chronic kidney disease: Secondary | ICD-10-CM | POA: Diagnosis not present

## 2018-03-23 DIAGNOSIS — G301 Alzheimer's disease with late onset: Secondary | ICD-10-CM | POA: Diagnosis not present

## 2018-03-27 DIAGNOSIS — M150 Primary generalized (osteo)arthritis: Secondary | ICD-10-CM | POA: Diagnosis not present

## 2018-03-27 DIAGNOSIS — N183 Chronic kidney disease, stage 3 (moderate): Secondary | ICD-10-CM | POA: Diagnosis not present

## 2018-03-27 DIAGNOSIS — I5032 Chronic diastolic (congestive) heart failure: Secondary | ICD-10-CM | POA: Diagnosis not present

## 2018-03-27 DIAGNOSIS — I480 Paroxysmal atrial fibrillation: Secondary | ICD-10-CM | POA: Diagnosis not present

## 2018-03-27 DIAGNOSIS — E1122 Type 2 diabetes mellitus with diabetic chronic kidney disease: Secondary | ICD-10-CM | POA: Diagnosis not present

## 2018-03-27 DIAGNOSIS — M810 Age-related osteoporosis without current pathological fracture: Secondary | ICD-10-CM | POA: Diagnosis not present

## 2018-03-27 DIAGNOSIS — S82101D Unspecified fracture of upper end of right tibia, subsequent encounter for closed fracture with routine healing: Secondary | ICD-10-CM | POA: Diagnosis not present

## 2018-03-27 DIAGNOSIS — I130 Hypertensive heart and chronic kidney disease with heart failure and stage 1 through stage 4 chronic kidney disease, or unspecified chronic kidney disease: Secondary | ICD-10-CM | POA: Diagnosis not present

## 2018-03-27 DIAGNOSIS — S82221D Displaced transverse fracture of shaft of right tibia, subsequent encounter for closed fracture with routine healing: Secondary | ICD-10-CM | POA: Diagnosis not present

## 2018-03-27 DIAGNOSIS — S82001D Unspecified fracture of right patella, subsequent encounter for closed fracture with routine healing: Secondary | ICD-10-CM | POA: Diagnosis not present

## 2018-03-27 DIAGNOSIS — I2510 Atherosclerotic heart disease of native coronary artery without angina pectoris: Secondary | ICD-10-CM | POA: Diagnosis not present

## 2018-03-27 DIAGNOSIS — E1140 Type 2 diabetes mellitus with diabetic neuropathy, unspecified: Secondary | ICD-10-CM | POA: Diagnosis not present

## 2018-03-27 DIAGNOSIS — S82421D Displaced transverse fracture of shaft of right fibula, subsequent encounter for closed fracture with routine healing: Secondary | ICD-10-CM | POA: Diagnosis not present

## 2018-03-27 NOTE — Telephone Encounter (Signed)
5 days ago the reports was placed in Dr Gardiner Ramus basket to be signed then faxed.

## 2018-03-28 ENCOUNTER — Other Ambulatory Visit: Payer: Self-pay | Admitting: *Deleted

## 2018-03-28 DIAGNOSIS — E1122 Type 2 diabetes mellitus with diabetic chronic kidney disease: Secondary | ICD-10-CM | POA: Diagnosis not present

## 2018-03-28 DIAGNOSIS — M810 Age-related osteoporosis without current pathological fracture: Secondary | ICD-10-CM | POA: Diagnosis not present

## 2018-03-28 DIAGNOSIS — S82001D Unspecified fracture of right patella, subsequent encounter for closed fracture with routine healing: Secondary | ICD-10-CM | POA: Diagnosis not present

## 2018-03-28 DIAGNOSIS — E1140 Type 2 diabetes mellitus with diabetic neuropathy, unspecified: Secondary | ICD-10-CM | POA: Diagnosis not present

## 2018-03-28 DIAGNOSIS — S82421D Displaced transverse fracture of shaft of right fibula, subsequent encounter for closed fracture with routine healing: Secondary | ICD-10-CM | POA: Diagnosis not present

## 2018-03-28 DIAGNOSIS — S82101D Unspecified fracture of upper end of right tibia, subsequent encounter for closed fracture with routine healing: Secondary | ICD-10-CM | POA: Diagnosis not present

## 2018-03-28 DIAGNOSIS — S82221D Displaced transverse fracture of shaft of right tibia, subsequent encounter for closed fracture with routine healing: Secondary | ICD-10-CM | POA: Diagnosis not present

## 2018-03-28 DIAGNOSIS — I480 Paroxysmal atrial fibrillation: Secondary | ICD-10-CM | POA: Diagnosis not present

## 2018-03-28 DIAGNOSIS — N183 Chronic kidney disease, stage 3 (moderate): Secondary | ICD-10-CM | POA: Diagnosis not present

## 2018-03-28 DIAGNOSIS — M150 Primary generalized (osteo)arthritis: Secondary | ICD-10-CM | POA: Diagnosis not present

## 2018-03-28 DIAGNOSIS — I130 Hypertensive heart and chronic kidney disease with heart failure and stage 1 through stage 4 chronic kidney disease, or unspecified chronic kidney disease: Secondary | ICD-10-CM | POA: Diagnosis not present

## 2018-03-28 DIAGNOSIS — I2510 Atherosclerotic heart disease of native coronary artery without angina pectoris: Secondary | ICD-10-CM | POA: Diagnosis not present

## 2018-03-28 DIAGNOSIS — I5032 Chronic diastolic (congestive) heart failure: Secondary | ICD-10-CM | POA: Diagnosis not present

## 2018-03-28 MED ORDER — GABAPENTIN 600 MG PO TABS: ORAL_TABLET | ORAL | 1 refills | Status: AC

## 2018-03-30 DIAGNOSIS — I2510 Atherosclerotic heart disease of native coronary artery without angina pectoris: Secondary | ICD-10-CM | POA: Diagnosis not present

## 2018-03-30 DIAGNOSIS — S82221D Displaced transverse fracture of shaft of right tibia, subsequent encounter for closed fracture with routine healing: Secondary | ICD-10-CM | POA: Diagnosis not present

## 2018-03-30 DIAGNOSIS — S82101D Unspecified fracture of upper end of right tibia, subsequent encounter for closed fracture with routine healing: Secondary | ICD-10-CM | POA: Diagnosis not present

## 2018-03-30 DIAGNOSIS — I5032 Chronic diastolic (congestive) heart failure: Secondary | ICD-10-CM | POA: Diagnosis not present

## 2018-03-30 DIAGNOSIS — I480 Paroxysmal atrial fibrillation: Secondary | ICD-10-CM | POA: Diagnosis not present

## 2018-03-30 DIAGNOSIS — N183 Chronic kidney disease, stage 3 (moderate): Secondary | ICD-10-CM | POA: Diagnosis not present

## 2018-03-30 DIAGNOSIS — S82001D Unspecified fracture of right patella, subsequent encounter for closed fracture with routine healing: Secondary | ICD-10-CM | POA: Diagnosis not present

## 2018-03-30 DIAGNOSIS — I130 Hypertensive heart and chronic kidney disease with heart failure and stage 1 through stage 4 chronic kidney disease, or unspecified chronic kidney disease: Secondary | ICD-10-CM | POA: Diagnosis not present

## 2018-03-30 DIAGNOSIS — M150 Primary generalized (osteo)arthritis: Secondary | ICD-10-CM | POA: Diagnosis not present

## 2018-03-30 DIAGNOSIS — M810 Age-related osteoporosis without current pathological fracture: Secondary | ICD-10-CM | POA: Diagnosis not present

## 2018-03-30 DIAGNOSIS — S82421D Displaced transverse fracture of shaft of right fibula, subsequent encounter for closed fracture with routine healing: Secondary | ICD-10-CM | POA: Diagnosis not present

## 2018-03-30 DIAGNOSIS — E1140 Type 2 diabetes mellitus with diabetic neuropathy, unspecified: Secondary | ICD-10-CM | POA: Diagnosis not present

## 2018-03-30 DIAGNOSIS — E1122 Type 2 diabetes mellitus with diabetic chronic kidney disease: Secondary | ICD-10-CM | POA: Diagnosis not present

## 2018-04-01 DIAGNOSIS — G8929 Other chronic pain: Secondary | ICD-10-CM | POA: Diagnosis not present

## 2018-04-04 ENCOUNTER — Ambulatory Visit (INDEPENDENT_AMBULATORY_CARE_PROVIDER_SITE_OTHER): Payer: Medicare Other | Admitting: Pharmacist Clinician (PhC)/ Clinical Pharmacy Specialist

## 2018-04-04 DIAGNOSIS — M150 Primary generalized (osteo)arthritis: Secondary | ICD-10-CM | POA: Diagnosis not present

## 2018-04-04 DIAGNOSIS — I480 Paroxysmal atrial fibrillation: Secondary | ICD-10-CM

## 2018-04-04 DIAGNOSIS — Z8673 Personal history of transient ischemic attack (TIA), and cerebral infarction without residual deficits: Secondary | ICD-10-CM

## 2018-04-04 DIAGNOSIS — E1122 Type 2 diabetes mellitus with diabetic chronic kidney disease: Secondary | ICD-10-CM | POA: Diagnosis not present

## 2018-04-04 DIAGNOSIS — M810 Age-related osteoporosis without current pathological fracture: Secondary | ICD-10-CM | POA: Diagnosis not present

## 2018-04-04 DIAGNOSIS — I2510 Atherosclerotic heart disease of native coronary artery without angina pectoris: Secondary | ICD-10-CM | POA: Diagnosis not present

## 2018-04-04 DIAGNOSIS — S82221D Displaced transverse fracture of shaft of right tibia, subsequent encounter for closed fracture with routine healing: Secondary | ICD-10-CM | POA: Diagnosis not present

## 2018-04-04 DIAGNOSIS — I5032 Chronic diastolic (congestive) heart failure: Secondary | ICD-10-CM | POA: Diagnosis not present

## 2018-04-04 DIAGNOSIS — Z7901 Long term (current) use of anticoagulants: Secondary | ICD-10-CM | POA: Diagnosis not present

## 2018-04-04 DIAGNOSIS — S82421D Displaced transverse fracture of shaft of right fibula, subsequent encounter for closed fracture with routine healing: Secondary | ICD-10-CM | POA: Diagnosis not present

## 2018-04-04 DIAGNOSIS — S82001D Unspecified fracture of right patella, subsequent encounter for closed fracture with routine healing: Secondary | ICD-10-CM | POA: Diagnosis not present

## 2018-04-04 DIAGNOSIS — E1140 Type 2 diabetes mellitus with diabetic neuropathy, unspecified: Secondary | ICD-10-CM | POA: Diagnosis not present

## 2018-04-04 DIAGNOSIS — I130 Hypertensive heart and chronic kidney disease with heart failure and stage 1 through stage 4 chronic kidney disease, or unspecified chronic kidney disease: Secondary | ICD-10-CM | POA: Diagnosis not present

## 2018-04-04 DIAGNOSIS — S82101D Unspecified fracture of upper end of right tibia, subsequent encounter for closed fracture with routine healing: Secondary | ICD-10-CM | POA: Diagnosis not present

## 2018-04-04 DIAGNOSIS — N183 Chronic kidney disease, stage 3 (moderate): Secondary | ICD-10-CM | POA: Diagnosis not present

## 2018-04-04 LAB — POCT INR: INR: 1.9

## 2018-04-05 DIAGNOSIS — S82421D Displaced transverse fracture of shaft of right fibula, subsequent encounter for closed fracture with routine healing: Secondary | ICD-10-CM | POA: Diagnosis not present

## 2018-04-05 DIAGNOSIS — M810 Age-related osteoporosis without current pathological fracture: Secondary | ICD-10-CM | POA: Diagnosis not present

## 2018-04-05 DIAGNOSIS — S82001D Unspecified fracture of right patella, subsequent encounter for closed fracture with routine healing: Secondary | ICD-10-CM | POA: Diagnosis not present

## 2018-04-05 DIAGNOSIS — S82021D Displaced longitudinal fracture of right patella, subsequent encounter for closed fracture with routine healing: Secondary | ICD-10-CM | POA: Diagnosis not present

## 2018-04-05 DIAGNOSIS — I5032 Chronic diastolic (congestive) heart failure: Secondary | ICD-10-CM | POA: Diagnosis not present

## 2018-04-05 DIAGNOSIS — Z8781 Personal history of (healed) traumatic fracture: Secondary | ICD-10-CM | POA: Diagnosis not present

## 2018-04-05 DIAGNOSIS — E1140 Type 2 diabetes mellitus with diabetic neuropathy, unspecified: Secondary | ICD-10-CM | POA: Diagnosis not present

## 2018-04-05 DIAGNOSIS — S82221D Displaced transverse fracture of shaft of right tibia, subsequent encounter for closed fracture with routine healing: Secondary | ICD-10-CM | POA: Diagnosis not present

## 2018-04-05 DIAGNOSIS — S72401D Unspecified fracture of lower end of right femur, subsequent encounter for closed fracture with routine healing: Secondary | ICD-10-CM | POA: Diagnosis not present

## 2018-04-05 DIAGNOSIS — N183 Chronic kidney disease, stage 3 (moderate): Secondary | ICD-10-CM | POA: Diagnosis not present

## 2018-04-05 DIAGNOSIS — S82101D Unspecified fracture of upper end of right tibia, subsequent encounter for closed fracture with routine healing: Secondary | ICD-10-CM | POA: Diagnosis not present

## 2018-04-05 DIAGNOSIS — M150 Primary generalized (osteo)arthritis: Secondary | ICD-10-CM | POA: Diagnosis not present

## 2018-04-05 DIAGNOSIS — E1122 Type 2 diabetes mellitus with diabetic chronic kidney disease: Secondary | ICD-10-CM | POA: Diagnosis not present

## 2018-04-05 DIAGNOSIS — S72491D Other fracture of lower end of right femur, subsequent encounter for closed fracture with routine healing: Secondary | ICD-10-CM | POA: Diagnosis not present

## 2018-04-05 DIAGNOSIS — I2510 Atherosclerotic heart disease of native coronary artery without angina pectoris: Secondary | ICD-10-CM | POA: Diagnosis not present

## 2018-04-05 DIAGNOSIS — I130 Hypertensive heart and chronic kidney disease with heart failure and stage 1 through stage 4 chronic kidney disease, or unspecified chronic kidney disease: Secondary | ICD-10-CM | POA: Diagnosis not present

## 2018-04-05 DIAGNOSIS — I480 Paroxysmal atrial fibrillation: Secondary | ICD-10-CM | POA: Diagnosis not present

## 2018-04-06 ENCOUNTER — Other Ambulatory Visit: Payer: Self-pay | Admitting: Family Medicine

## 2018-04-06 DIAGNOSIS — E1122 Type 2 diabetes mellitus with diabetic chronic kidney disease: Secondary | ICD-10-CM | POA: Diagnosis not present

## 2018-04-06 DIAGNOSIS — I480 Paroxysmal atrial fibrillation: Secondary | ICD-10-CM | POA: Diagnosis not present

## 2018-04-06 DIAGNOSIS — E1140 Type 2 diabetes mellitus with diabetic neuropathy, unspecified: Secondary | ICD-10-CM | POA: Diagnosis not present

## 2018-04-06 DIAGNOSIS — M150 Primary generalized (osteo)arthritis: Secondary | ICD-10-CM | POA: Diagnosis not present

## 2018-04-06 DIAGNOSIS — S82221D Displaced transverse fracture of shaft of right tibia, subsequent encounter for closed fracture with routine healing: Secondary | ICD-10-CM | POA: Diagnosis not present

## 2018-04-06 DIAGNOSIS — S82001D Unspecified fracture of right patella, subsequent encounter for closed fracture with routine healing: Secondary | ICD-10-CM | POA: Diagnosis not present

## 2018-04-06 DIAGNOSIS — N183 Chronic kidney disease, stage 3 (moderate): Secondary | ICD-10-CM | POA: Diagnosis not present

## 2018-04-06 DIAGNOSIS — S82421D Displaced transverse fracture of shaft of right fibula, subsequent encounter for closed fracture with routine healing: Secondary | ICD-10-CM | POA: Diagnosis not present

## 2018-04-06 DIAGNOSIS — I5032 Chronic diastolic (congestive) heart failure: Secondary | ICD-10-CM | POA: Diagnosis not present

## 2018-04-06 DIAGNOSIS — M810 Age-related osteoporosis without current pathological fracture: Secondary | ICD-10-CM | POA: Diagnosis not present

## 2018-04-06 DIAGNOSIS — I130 Hypertensive heart and chronic kidney disease with heart failure and stage 1 through stage 4 chronic kidney disease, or unspecified chronic kidney disease: Secondary | ICD-10-CM | POA: Diagnosis not present

## 2018-04-06 DIAGNOSIS — S82101D Unspecified fracture of upper end of right tibia, subsequent encounter for closed fracture with routine healing: Secondary | ICD-10-CM | POA: Diagnosis not present

## 2018-04-06 DIAGNOSIS — I2510 Atherosclerotic heart disease of native coronary artery without angina pectoris: Secondary | ICD-10-CM | POA: Diagnosis not present

## 2018-04-07 DIAGNOSIS — S82001D Unspecified fracture of right patella, subsequent encounter for closed fracture with routine healing: Secondary | ICD-10-CM | POA: Diagnosis not present

## 2018-04-07 DIAGNOSIS — I480 Paroxysmal atrial fibrillation: Secondary | ICD-10-CM | POA: Diagnosis not present

## 2018-04-07 DIAGNOSIS — S82101D Unspecified fracture of upper end of right tibia, subsequent encounter for closed fracture with routine healing: Secondary | ICD-10-CM | POA: Diagnosis not present

## 2018-04-07 DIAGNOSIS — M810 Age-related osteoporosis without current pathological fracture: Secondary | ICD-10-CM | POA: Diagnosis not present

## 2018-04-07 DIAGNOSIS — E1140 Type 2 diabetes mellitus with diabetic neuropathy, unspecified: Secondary | ICD-10-CM | POA: Diagnosis not present

## 2018-04-07 DIAGNOSIS — M150 Primary generalized (osteo)arthritis: Secondary | ICD-10-CM | POA: Diagnosis not present

## 2018-04-07 DIAGNOSIS — S82421D Displaced transverse fracture of shaft of right fibula, subsequent encounter for closed fracture with routine healing: Secondary | ICD-10-CM | POA: Diagnosis not present

## 2018-04-07 DIAGNOSIS — Z794 Long term (current) use of insulin: Secondary | ICD-10-CM | POA: Diagnosis not present

## 2018-04-07 DIAGNOSIS — S82221D Displaced transverse fracture of shaft of right tibia, subsequent encounter for closed fracture with routine healing: Secondary | ICD-10-CM | POA: Diagnosis not present

## 2018-04-07 DIAGNOSIS — E1122 Type 2 diabetes mellitus with diabetic chronic kidney disease: Secondary | ICD-10-CM | POA: Diagnosis not present

## 2018-04-07 DIAGNOSIS — K222 Esophageal obstruction: Secondary | ICD-10-CM | POA: Diagnosis not present

## 2018-04-07 DIAGNOSIS — I130 Hypertensive heart and chronic kidney disease with heart failure and stage 1 through stage 4 chronic kidney disease, or unspecified chronic kidney disease: Secondary | ICD-10-CM | POA: Diagnosis not present

## 2018-04-07 DIAGNOSIS — Z9181 History of falling: Secondary | ICD-10-CM | POA: Diagnosis not present

## 2018-04-07 DIAGNOSIS — I2510 Atherosclerotic heart disease of native coronary artery without angina pectoris: Secondary | ICD-10-CM | POA: Diagnosis not present

## 2018-04-07 DIAGNOSIS — E039 Hypothyroidism, unspecified: Secondary | ICD-10-CM | POA: Diagnosis not present

## 2018-04-07 DIAGNOSIS — I5032 Chronic diastolic (congestive) heart failure: Secondary | ICD-10-CM | POA: Diagnosis not present

## 2018-04-07 DIAGNOSIS — E1149 Type 2 diabetes mellitus with other diabetic neurological complication: Secondary | ICD-10-CM | POA: Diagnosis not present

## 2018-04-07 DIAGNOSIS — N183 Chronic kidney disease, stage 3 (moderate): Secondary | ICD-10-CM | POA: Diagnosis not present

## 2018-04-07 DIAGNOSIS — M8000XD Age-related osteoporosis with current pathological fracture, unspecified site, subsequent encounter for fracture with routine healing: Secondary | ICD-10-CM | POA: Diagnosis not present

## 2018-04-07 DIAGNOSIS — K219 Gastro-esophageal reflux disease without esophagitis: Secondary | ICD-10-CM | POA: Diagnosis not present

## 2018-04-07 LAB — HM DEXA SCAN

## 2018-04-10 DIAGNOSIS — N183 Chronic kidney disease, stage 3 (moderate): Secondary | ICD-10-CM | POA: Diagnosis not present

## 2018-04-10 DIAGNOSIS — S82101D Unspecified fracture of upper end of right tibia, subsequent encounter for closed fracture with routine healing: Secondary | ICD-10-CM | POA: Diagnosis not present

## 2018-04-10 DIAGNOSIS — M810 Age-related osteoporosis without current pathological fracture: Secondary | ICD-10-CM | POA: Diagnosis not present

## 2018-04-10 DIAGNOSIS — I480 Paroxysmal atrial fibrillation: Secondary | ICD-10-CM | POA: Diagnosis not present

## 2018-04-10 DIAGNOSIS — I130 Hypertensive heart and chronic kidney disease with heart failure and stage 1 through stage 4 chronic kidney disease, or unspecified chronic kidney disease: Secondary | ICD-10-CM | POA: Diagnosis not present

## 2018-04-10 DIAGNOSIS — M150 Primary generalized (osteo)arthritis: Secondary | ICD-10-CM | POA: Diagnosis not present

## 2018-04-10 DIAGNOSIS — I2510 Atherosclerotic heart disease of native coronary artery without angina pectoris: Secondary | ICD-10-CM | POA: Diagnosis not present

## 2018-04-10 DIAGNOSIS — I5032 Chronic diastolic (congestive) heart failure: Secondary | ICD-10-CM | POA: Diagnosis not present

## 2018-04-10 DIAGNOSIS — S82001D Unspecified fracture of right patella, subsequent encounter for closed fracture with routine healing: Secondary | ICD-10-CM | POA: Diagnosis not present

## 2018-04-10 DIAGNOSIS — S82221D Displaced transverse fracture of shaft of right tibia, subsequent encounter for closed fracture with routine healing: Secondary | ICD-10-CM | POA: Diagnosis not present

## 2018-04-10 DIAGNOSIS — S82421D Displaced transverse fracture of shaft of right fibula, subsequent encounter for closed fracture with routine healing: Secondary | ICD-10-CM | POA: Diagnosis not present

## 2018-04-10 DIAGNOSIS — E1140 Type 2 diabetes mellitus with diabetic neuropathy, unspecified: Secondary | ICD-10-CM | POA: Diagnosis not present

## 2018-04-10 DIAGNOSIS — E1122 Type 2 diabetes mellitus with diabetic chronic kidney disease: Secondary | ICD-10-CM | POA: Diagnosis not present

## 2018-04-11 ENCOUNTER — Encounter: Payer: Self-pay | Admitting: Family Medicine

## 2018-04-11 DIAGNOSIS — M150 Primary generalized (osteo)arthritis: Secondary | ICD-10-CM | POA: Diagnosis not present

## 2018-04-11 DIAGNOSIS — M810 Age-related osteoporosis without current pathological fracture: Secondary | ICD-10-CM | POA: Diagnosis not present

## 2018-04-11 DIAGNOSIS — S82101D Unspecified fracture of upper end of right tibia, subsequent encounter for closed fracture with routine healing: Secondary | ICD-10-CM | POA: Diagnosis not present

## 2018-04-11 DIAGNOSIS — S82001D Unspecified fracture of right patella, subsequent encounter for closed fracture with routine healing: Secondary | ICD-10-CM | POA: Diagnosis not present

## 2018-04-11 DIAGNOSIS — E1122 Type 2 diabetes mellitus with diabetic chronic kidney disease: Secondary | ICD-10-CM | POA: Diagnosis not present

## 2018-04-11 DIAGNOSIS — I130 Hypertensive heart and chronic kidney disease with heart failure and stage 1 through stage 4 chronic kidney disease, or unspecified chronic kidney disease: Secondary | ICD-10-CM | POA: Diagnosis not present

## 2018-04-11 DIAGNOSIS — I480 Paroxysmal atrial fibrillation: Secondary | ICD-10-CM | POA: Diagnosis not present

## 2018-04-11 DIAGNOSIS — E1140 Type 2 diabetes mellitus with diabetic neuropathy, unspecified: Secondary | ICD-10-CM | POA: Diagnosis not present

## 2018-04-11 DIAGNOSIS — N183 Chronic kidney disease, stage 3 (moderate): Secondary | ICD-10-CM | POA: Diagnosis not present

## 2018-04-11 DIAGNOSIS — I5032 Chronic diastolic (congestive) heart failure: Secondary | ICD-10-CM | POA: Diagnosis not present

## 2018-04-11 DIAGNOSIS — I2510 Atherosclerotic heart disease of native coronary artery without angina pectoris: Secondary | ICD-10-CM | POA: Diagnosis not present

## 2018-04-11 DIAGNOSIS — S82221D Displaced transverse fracture of shaft of right tibia, subsequent encounter for closed fracture with routine healing: Secondary | ICD-10-CM | POA: Diagnosis not present

## 2018-04-11 DIAGNOSIS — S82421D Displaced transverse fracture of shaft of right fibula, subsequent encounter for closed fracture with routine healing: Secondary | ICD-10-CM | POA: Diagnosis not present

## 2018-04-12 DIAGNOSIS — M810 Age-related osteoporosis without current pathological fracture: Secondary | ICD-10-CM | POA: Diagnosis not present

## 2018-04-12 DIAGNOSIS — I130 Hypertensive heart and chronic kidney disease with heart failure and stage 1 through stage 4 chronic kidney disease, or unspecified chronic kidney disease: Secondary | ICD-10-CM | POA: Diagnosis not present

## 2018-04-12 DIAGNOSIS — S82101D Unspecified fracture of upper end of right tibia, subsequent encounter for closed fracture with routine healing: Secondary | ICD-10-CM | POA: Diagnosis not present

## 2018-04-12 DIAGNOSIS — E1140 Type 2 diabetes mellitus with diabetic neuropathy, unspecified: Secondary | ICD-10-CM | POA: Diagnosis not present

## 2018-04-12 DIAGNOSIS — I5032 Chronic diastolic (congestive) heart failure: Secondary | ICD-10-CM | POA: Diagnosis not present

## 2018-04-12 DIAGNOSIS — S82221D Displaced transverse fracture of shaft of right tibia, subsequent encounter for closed fracture with routine healing: Secondary | ICD-10-CM | POA: Diagnosis not present

## 2018-04-12 DIAGNOSIS — S82001D Unspecified fracture of right patella, subsequent encounter for closed fracture with routine healing: Secondary | ICD-10-CM | POA: Diagnosis not present

## 2018-04-12 DIAGNOSIS — I2510 Atherosclerotic heart disease of native coronary artery without angina pectoris: Secondary | ICD-10-CM | POA: Diagnosis not present

## 2018-04-12 DIAGNOSIS — M150 Primary generalized (osteo)arthritis: Secondary | ICD-10-CM | POA: Diagnosis not present

## 2018-04-12 DIAGNOSIS — I480 Paroxysmal atrial fibrillation: Secondary | ICD-10-CM | POA: Diagnosis not present

## 2018-04-12 DIAGNOSIS — S82421D Displaced transverse fracture of shaft of right fibula, subsequent encounter for closed fracture with routine healing: Secondary | ICD-10-CM | POA: Diagnosis not present

## 2018-04-12 DIAGNOSIS — E1122 Type 2 diabetes mellitus with diabetic chronic kidney disease: Secondary | ICD-10-CM | POA: Diagnosis not present

## 2018-04-12 DIAGNOSIS — N183 Chronic kidney disease, stage 3 (moderate): Secondary | ICD-10-CM | POA: Diagnosis not present

## 2018-04-13 DIAGNOSIS — I130 Hypertensive heart and chronic kidney disease with heart failure and stage 1 through stage 4 chronic kidney disease, or unspecified chronic kidney disease: Secondary | ICD-10-CM | POA: Diagnosis not present

## 2018-04-13 DIAGNOSIS — N183 Chronic kidney disease, stage 3 (moderate): Secondary | ICD-10-CM | POA: Diagnosis not present

## 2018-04-13 DIAGNOSIS — S82421D Displaced transverse fracture of shaft of right fibula, subsequent encounter for closed fracture with routine healing: Secondary | ICD-10-CM | POA: Diagnosis not present

## 2018-04-13 DIAGNOSIS — M810 Age-related osteoporosis without current pathological fracture: Secondary | ICD-10-CM | POA: Diagnosis not present

## 2018-04-13 DIAGNOSIS — I2510 Atherosclerotic heart disease of native coronary artery without angina pectoris: Secondary | ICD-10-CM | POA: Diagnosis not present

## 2018-04-13 DIAGNOSIS — S82101D Unspecified fracture of upper end of right tibia, subsequent encounter for closed fracture with routine healing: Secondary | ICD-10-CM | POA: Diagnosis not present

## 2018-04-13 DIAGNOSIS — E1122 Type 2 diabetes mellitus with diabetic chronic kidney disease: Secondary | ICD-10-CM | POA: Diagnosis not present

## 2018-04-13 DIAGNOSIS — S82221D Displaced transverse fracture of shaft of right tibia, subsequent encounter for closed fracture with routine healing: Secondary | ICD-10-CM | POA: Diagnosis not present

## 2018-04-13 DIAGNOSIS — I480 Paroxysmal atrial fibrillation: Secondary | ICD-10-CM | POA: Diagnosis not present

## 2018-04-13 DIAGNOSIS — E1140 Type 2 diabetes mellitus with diabetic neuropathy, unspecified: Secondary | ICD-10-CM | POA: Diagnosis not present

## 2018-04-13 DIAGNOSIS — M150 Primary generalized (osteo)arthritis: Secondary | ICD-10-CM | POA: Diagnosis not present

## 2018-04-13 DIAGNOSIS — S82001D Unspecified fracture of right patella, subsequent encounter for closed fracture with routine healing: Secondary | ICD-10-CM | POA: Diagnosis not present

## 2018-04-13 DIAGNOSIS — I5032 Chronic diastolic (congestive) heart failure: Secondary | ICD-10-CM | POA: Diagnosis not present

## 2018-04-17 DIAGNOSIS — S82421D Displaced transverse fracture of shaft of right fibula, subsequent encounter for closed fracture with routine healing: Secondary | ICD-10-CM | POA: Diagnosis not present

## 2018-04-17 DIAGNOSIS — M150 Primary generalized (osteo)arthritis: Secondary | ICD-10-CM | POA: Diagnosis not present

## 2018-04-17 DIAGNOSIS — I130 Hypertensive heart and chronic kidney disease with heart failure and stage 1 through stage 4 chronic kidney disease, or unspecified chronic kidney disease: Secondary | ICD-10-CM | POA: Diagnosis not present

## 2018-04-17 DIAGNOSIS — I2510 Atherosclerotic heart disease of native coronary artery without angina pectoris: Secondary | ICD-10-CM | POA: Diagnosis not present

## 2018-04-17 DIAGNOSIS — E1122 Type 2 diabetes mellitus with diabetic chronic kidney disease: Secondary | ICD-10-CM | POA: Diagnosis not present

## 2018-04-17 DIAGNOSIS — N183 Chronic kidney disease, stage 3 (moderate): Secondary | ICD-10-CM | POA: Diagnosis not present

## 2018-04-17 DIAGNOSIS — S82001D Unspecified fracture of right patella, subsequent encounter for closed fracture with routine healing: Secondary | ICD-10-CM | POA: Diagnosis not present

## 2018-04-17 DIAGNOSIS — S82101D Unspecified fracture of upper end of right tibia, subsequent encounter for closed fracture with routine healing: Secondary | ICD-10-CM | POA: Diagnosis not present

## 2018-04-17 DIAGNOSIS — M810 Age-related osteoporosis without current pathological fracture: Secondary | ICD-10-CM | POA: Diagnosis not present

## 2018-04-17 DIAGNOSIS — I5032 Chronic diastolic (congestive) heart failure: Secondary | ICD-10-CM | POA: Diagnosis not present

## 2018-04-17 DIAGNOSIS — S82221D Displaced transverse fracture of shaft of right tibia, subsequent encounter for closed fracture with routine healing: Secondary | ICD-10-CM | POA: Diagnosis not present

## 2018-04-17 DIAGNOSIS — I480 Paroxysmal atrial fibrillation: Secondary | ICD-10-CM | POA: Diagnosis not present

## 2018-04-17 DIAGNOSIS — E1140 Type 2 diabetes mellitus with diabetic neuropathy, unspecified: Secondary | ICD-10-CM | POA: Diagnosis not present

## 2018-04-18 ENCOUNTER — Ambulatory Visit (INDEPENDENT_AMBULATORY_CARE_PROVIDER_SITE_OTHER): Payer: Medicare Other | Admitting: Pharmacist

## 2018-04-18 DIAGNOSIS — I130 Hypertensive heart and chronic kidney disease with heart failure and stage 1 through stage 4 chronic kidney disease, or unspecified chronic kidney disease: Secondary | ICD-10-CM | POA: Diagnosis not present

## 2018-04-18 DIAGNOSIS — S82101D Unspecified fracture of upper end of right tibia, subsequent encounter for closed fracture with routine healing: Secondary | ICD-10-CM | POA: Diagnosis not present

## 2018-04-18 DIAGNOSIS — Z7901 Long term (current) use of anticoagulants: Secondary | ICD-10-CM

## 2018-04-18 DIAGNOSIS — S82221D Displaced transverse fracture of shaft of right tibia, subsequent encounter for closed fracture with routine healing: Secondary | ICD-10-CM | POA: Diagnosis not present

## 2018-04-18 DIAGNOSIS — I2510 Atherosclerotic heart disease of native coronary artery without angina pectoris: Secondary | ICD-10-CM | POA: Diagnosis not present

## 2018-04-18 DIAGNOSIS — E1140 Type 2 diabetes mellitus with diabetic neuropathy, unspecified: Secondary | ICD-10-CM | POA: Diagnosis not present

## 2018-04-18 DIAGNOSIS — E1122 Type 2 diabetes mellitus with diabetic chronic kidney disease: Secondary | ICD-10-CM | POA: Diagnosis not present

## 2018-04-18 DIAGNOSIS — S82421D Displaced transverse fracture of shaft of right fibula, subsequent encounter for closed fracture with routine healing: Secondary | ICD-10-CM | POA: Diagnosis not present

## 2018-04-18 DIAGNOSIS — N183 Chronic kidney disease, stage 3 (moderate): Secondary | ICD-10-CM | POA: Diagnosis not present

## 2018-04-18 DIAGNOSIS — S82001D Unspecified fracture of right patella, subsequent encounter for closed fracture with routine healing: Secondary | ICD-10-CM | POA: Diagnosis not present

## 2018-04-18 DIAGNOSIS — I5032 Chronic diastolic (congestive) heart failure: Secondary | ICD-10-CM | POA: Diagnosis not present

## 2018-04-18 DIAGNOSIS — M810 Age-related osteoporosis without current pathological fracture: Secondary | ICD-10-CM | POA: Diagnosis not present

## 2018-04-18 DIAGNOSIS — M150 Primary generalized (osteo)arthritis: Secondary | ICD-10-CM | POA: Diagnosis not present

## 2018-04-18 DIAGNOSIS — I480 Paroxysmal atrial fibrillation: Secondary | ICD-10-CM | POA: Diagnosis not present

## 2018-04-18 LAB — POCT INR: INR: 2.2

## 2018-04-19 DIAGNOSIS — E1140 Type 2 diabetes mellitus with diabetic neuropathy, unspecified: Secondary | ICD-10-CM | POA: Diagnosis not present

## 2018-04-19 DIAGNOSIS — I5032 Chronic diastolic (congestive) heart failure: Secondary | ICD-10-CM | POA: Diagnosis not present

## 2018-04-19 DIAGNOSIS — N183 Chronic kidney disease, stage 3 (moderate): Secondary | ICD-10-CM | POA: Diagnosis not present

## 2018-04-19 DIAGNOSIS — S82101D Unspecified fracture of upper end of right tibia, subsequent encounter for closed fracture with routine healing: Secondary | ICD-10-CM | POA: Diagnosis not present

## 2018-04-19 DIAGNOSIS — S82421D Displaced transverse fracture of shaft of right fibula, subsequent encounter for closed fracture with routine healing: Secondary | ICD-10-CM | POA: Diagnosis not present

## 2018-04-19 DIAGNOSIS — M150 Primary generalized (osteo)arthritis: Secondary | ICD-10-CM | POA: Diagnosis not present

## 2018-04-19 DIAGNOSIS — I480 Paroxysmal atrial fibrillation: Secondary | ICD-10-CM | POA: Diagnosis not present

## 2018-04-19 DIAGNOSIS — S82221D Displaced transverse fracture of shaft of right tibia, subsequent encounter for closed fracture with routine healing: Secondary | ICD-10-CM | POA: Diagnosis not present

## 2018-04-19 DIAGNOSIS — I130 Hypertensive heart and chronic kidney disease with heart failure and stage 1 through stage 4 chronic kidney disease, or unspecified chronic kidney disease: Secondary | ICD-10-CM | POA: Diagnosis not present

## 2018-04-19 DIAGNOSIS — M810 Age-related osteoporosis without current pathological fracture: Secondary | ICD-10-CM | POA: Diagnosis not present

## 2018-04-19 DIAGNOSIS — S82001D Unspecified fracture of right patella, subsequent encounter for closed fracture with routine healing: Secondary | ICD-10-CM | POA: Diagnosis not present

## 2018-04-19 DIAGNOSIS — I2510 Atherosclerotic heart disease of native coronary artery without angina pectoris: Secondary | ICD-10-CM | POA: Diagnosis not present

## 2018-04-19 DIAGNOSIS — E1122 Type 2 diabetes mellitus with diabetic chronic kidney disease: Secondary | ICD-10-CM | POA: Diagnosis not present

## 2018-04-20 DIAGNOSIS — M150 Primary generalized (osteo)arthritis: Secondary | ICD-10-CM | POA: Diagnosis not present

## 2018-04-20 DIAGNOSIS — I480 Paroxysmal atrial fibrillation: Secondary | ICD-10-CM | POA: Diagnosis not present

## 2018-04-20 DIAGNOSIS — S82421D Displaced transverse fracture of shaft of right fibula, subsequent encounter for closed fracture with routine healing: Secondary | ICD-10-CM | POA: Diagnosis not present

## 2018-04-20 DIAGNOSIS — S82001D Unspecified fracture of right patella, subsequent encounter for closed fracture with routine healing: Secondary | ICD-10-CM | POA: Diagnosis not present

## 2018-04-20 DIAGNOSIS — M810 Age-related osteoporosis without current pathological fracture: Secondary | ICD-10-CM | POA: Diagnosis not present

## 2018-04-20 DIAGNOSIS — I2510 Atherosclerotic heart disease of native coronary artery without angina pectoris: Secondary | ICD-10-CM | POA: Diagnosis not present

## 2018-04-20 DIAGNOSIS — E1140 Type 2 diabetes mellitus with diabetic neuropathy, unspecified: Secondary | ICD-10-CM | POA: Diagnosis not present

## 2018-04-20 DIAGNOSIS — N183 Chronic kidney disease, stage 3 (moderate): Secondary | ICD-10-CM | POA: Diagnosis not present

## 2018-04-20 DIAGNOSIS — S82101D Unspecified fracture of upper end of right tibia, subsequent encounter for closed fracture with routine healing: Secondary | ICD-10-CM | POA: Diagnosis not present

## 2018-04-20 DIAGNOSIS — S82221D Displaced transverse fracture of shaft of right tibia, subsequent encounter for closed fracture with routine healing: Secondary | ICD-10-CM | POA: Diagnosis not present

## 2018-04-20 DIAGNOSIS — I130 Hypertensive heart and chronic kidney disease with heart failure and stage 1 through stage 4 chronic kidney disease, or unspecified chronic kidney disease: Secondary | ICD-10-CM | POA: Diagnosis not present

## 2018-04-20 DIAGNOSIS — E1122 Type 2 diabetes mellitus with diabetic chronic kidney disease: Secondary | ICD-10-CM | POA: Diagnosis not present

## 2018-04-20 DIAGNOSIS — I5032 Chronic diastolic (congestive) heart failure: Secondary | ICD-10-CM | POA: Diagnosis not present

## 2018-04-23 DIAGNOSIS — I5033 Acute on chronic diastolic (congestive) heart failure: Secondary | ICD-10-CM | POA: Diagnosis not present

## 2018-04-23 DIAGNOSIS — G301 Alzheimer's disease with late onset: Secondary | ICD-10-CM | POA: Diagnosis not present

## 2018-04-24 DIAGNOSIS — S82001D Unspecified fracture of right patella, subsequent encounter for closed fracture with routine healing: Secondary | ICD-10-CM | POA: Diagnosis not present

## 2018-04-24 DIAGNOSIS — I480 Paroxysmal atrial fibrillation: Secondary | ICD-10-CM | POA: Diagnosis not present

## 2018-04-24 DIAGNOSIS — I130 Hypertensive heart and chronic kidney disease with heart failure and stage 1 through stage 4 chronic kidney disease, or unspecified chronic kidney disease: Secondary | ICD-10-CM | POA: Diagnosis not present

## 2018-04-24 DIAGNOSIS — I2510 Atherosclerotic heart disease of native coronary artery without angina pectoris: Secondary | ICD-10-CM | POA: Diagnosis not present

## 2018-04-24 DIAGNOSIS — E1140 Type 2 diabetes mellitus with diabetic neuropathy, unspecified: Secondary | ICD-10-CM | POA: Diagnosis not present

## 2018-04-24 DIAGNOSIS — M810 Age-related osteoporosis without current pathological fracture: Secondary | ICD-10-CM | POA: Diagnosis not present

## 2018-04-24 DIAGNOSIS — E1122 Type 2 diabetes mellitus with diabetic chronic kidney disease: Secondary | ICD-10-CM | POA: Diagnosis not present

## 2018-04-24 DIAGNOSIS — I5032 Chronic diastolic (congestive) heart failure: Secondary | ICD-10-CM | POA: Diagnosis not present

## 2018-04-24 DIAGNOSIS — S82421D Displaced transverse fracture of shaft of right fibula, subsequent encounter for closed fracture with routine healing: Secondary | ICD-10-CM | POA: Diagnosis not present

## 2018-04-24 DIAGNOSIS — S82221D Displaced transverse fracture of shaft of right tibia, subsequent encounter for closed fracture with routine healing: Secondary | ICD-10-CM | POA: Diagnosis not present

## 2018-04-24 DIAGNOSIS — N183 Chronic kidney disease, stage 3 (moderate): Secondary | ICD-10-CM | POA: Diagnosis not present

## 2018-04-24 DIAGNOSIS — M150 Primary generalized (osteo)arthritis: Secondary | ICD-10-CM | POA: Diagnosis not present

## 2018-04-24 DIAGNOSIS — S82101D Unspecified fracture of upper end of right tibia, subsequent encounter for closed fracture with routine healing: Secondary | ICD-10-CM | POA: Diagnosis not present

## 2018-04-25 DIAGNOSIS — S82001D Unspecified fracture of right patella, subsequent encounter for closed fracture with routine healing: Secondary | ICD-10-CM | POA: Diagnosis not present

## 2018-04-25 DIAGNOSIS — N183 Chronic kidney disease, stage 3 (moderate): Secondary | ICD-10-CM | POA: Diagnosis not present

## 2018-04-25 DIAGNOSIS — M150 Primary generalized (osteo)arthritis: Secondary | ICD-10-CM | POA: Diagnosis not present

## 2018-04-25 DIAGNOSIS — M810 Age-related osteoporosis without current pathological fracture: Secondary | ICD-10-CM | POA: Diagnosis not present

## 2018-04-25 DIAGNOSIS — S82221D Displaced transverse fracture of shaft of right tibia, subsequent encounter for closed fracture with routine healing: Secondary | ICD-10-CM | POA: Diagnosis not present

## 2018-04-25 DIAGNOSIS — I2510 Atherosclerotic heart disease of native coronary artery without angina pectoris: Secondary | ICD-10-CM | POA: Diagnosis not present

## 2018-04-25 DIAGNOSIS — S82421D Displaced transverse fracture of shaft of right fibula, subsequent encounter for closed fracture with routine healing: Secondary | ICD-10-CM | POA: Diagnosis not present

## 2018-04-25 DIAGNOSIS — E1122 Type 2 diabetes mellitus with diabetic chronic kidney disease: Secondary | ICD-10-CM | POA: Diagnosis not present

## 2018-04-25 DIAGNOSIS — I130 Hypertensive heart and chronic kidney disease with heart failure and stage 1 through stage 4 chronic kidney disease, or unspecified chronic kidney disease: Secondary | ICD-10-CM | POA: Diagnosis not present

## 2018-04-25 DIAGNOSIS — E1140 Type 2 diabetes mellitus with diabetic neuropathy, unspecified: Secondary | ICD-10-CM | POA: Diagnosis not present

## 2018-04-25 DIAGNOSIS — I5032 Chronic diastolic (congestive) heart failure: Secondary | ICD-10-CM | POA: Diagnosis not present

## 2018-04-25 DIAGNOSIS — S82101D Unspecified fracture of upper end of right tibia, subsequent encounter for closed fracture with routine healing: Secondary | ICD-10-CM | POA: Diagnosis not present

## 2018-04-25 DIAGNOSIS — I480 Paroxysmal atrial fibrillation: Secondary | ICD-10-CM | POA: Diagnosis not present

## 2018-04-26 DIAGNOSIS — E1122 Type 2 diabetes mellitus with diabetic chronic kidney disease: Secondary | ICD-10-CM | POA: Diagnosis not present

## 2018-04-26 DIAGNOSIS — I5032 Chronic diastolic (congestive) heart failure: Secondary | ICD-10-CM | POA: Diagnosis not present

## 2018-04-26 DIAGNOSIS — I130 Hypertensive heart and chronic kidney disease with heart failure and stage 1 through stage 4 chronic kidney disease, or unspecified chronic kidney disease: Secondary | ICD-10-CM | POA: Diagnosis not present

## 2018-04-26 DIAGNOSIS — S82101D Unspecified fracture of upper end of right tibia, subsequent encounter for closed fracture with routine healing: Secondary | ICD-10-CM | POA: Diagnosis not present

## 2018-04-26 DIAGNOSIS — M810 Age-related osteoporosis without current pathological fracture: Secondary | ICD-10-CM | POA: Diagnosis not present

## 2018-04-26 DIAGNOSIS — S82221D Displaced transverse fracture of shaft of right tibia, subsequent encounter for closed fracture with routine healing: Secondary | ICD-10-CM | POA: Diagnosis not present

## 2018-04-26 DIAGNOSIS — I480 Paroxysmal atrial fibrillation: Secondary | ICD-10-CM | POA: Diagnosis not present

## 2018-04-26 DIAGNOSIS — I2510 Atherosclerotic heart disease of native coronary artery without angina pectoris: Secondary | ICD-10-CM | POA: Diagnosis not present

## 2018-04-26 DIAGNOSIS — M150 Primary generalized (osteo)arthritis: Secondary | ICD-10-CM | POA: Diagnosis not present

## 2018-04-26 DIAGNOSIS — E1140 Type 2 diabetes mellitus with diabetic neuropathy, unspecified: Secondary | ICD-10-CM | POA: Diagnosis not present

## 2018-04-26 DIAGNOSIS — S82001D Unspecified fracture of right patella, subsequent encounter for closed fracture with routine healing: Secondary | ICD-10-CM | POA: Diagnosis not present

## 2018-04-26 DIAGNOSIS — S82421D Displaced transverse fracture of shaft of right fibula, subsequent encounter for closed fracture with routine healing: Secondary | ICD-10-CM | POA: Diagnosis not present

## 2018-04-26 DIAGNOSIS — N183 Chronic kidney disease, stage 3 (moderate): Secondary | ICD-10-CM | POA: Diagnosis not present

## 2018-04-27 DIAGNOSIS — I480 Paroxysmal atrial fibrillation: Secondary | ICD-10-CM | POA: Diagnosis not present

## 2018-04-27 DIAGNOSIS — M150 Primary generalized (osteo)arthritis: Secondary | ICD-10-CM | POA: Diagnosis not present

## 2018-04-27 DIAGNOSIS — S82221D Displaced transverse fracture of shaft of right tibia, subsequent encounter for closed fracture with routine healing: Secondary | ICD-10-CM | POA: Diagnosis not present

## 2018-04-27 DIAGNOSIS — S82001D Unspecified fracture of right patella, subsequent encounter for closed fracture with routine healing: Secondary | ICD-10-CM | POA: Diagnosis not present

## 2018-04-27 DIAGNOSIS — E1122 Type 2 diabetes mellitus with diabetic chronic kidney disease: Secondary | ICD-10-CM | POA: Diagnosis not present

## 2018-04-27 DIAGNOSIS — S82101D Unspecified fracture of upper end of right tibia, subsequent encounter for closed fracture with routine healing: Secondary | ICD-10-CM | POA: Diagnosis not present

## 2018-04-27 DIAGNOSIS — N183 Chronic kidney disease, stage 3 (moderate): Secondary | ICD-10-CM | POA: Diagnosis not present

## 2018-04-27 DIAGNOSIS — I5032 Chronic diastolic (congestive) heart failure: Secondary | ICD-10-CM | POA: Diagnosis not present

## 2018-04-27 DIAGNOSIS — I2510 Atherosclerotic heart disease of native coronary artery without angina pectoris: Secondary | ICD-10-CM | POA: Diagnosis not present

## 2018-04-27 DIAGNOSIS — I130 Hypertensive heart and chronic kidney disease with heart failure and stage 1 through stage 4 chronic kidney disease, or unspecified chronic kidney disease: Secondary | ICD-10-CM | POA: Diagnosis not present

## 2018-04-27 DIAGNOSIS — E1140 Type 2 diabetes mellitus with diabetic neuropathy, unspecified: Secondary | ICD-10-CM | POA: Diagnosis not present

## 2018-04-27 DIAGNOSIS — M810 Age-related osteoporosis without current pathological fracture: Secondary | ICD-10-CM | POA: Diagnosis not present

## 2018-04-27 DIAGNOSIS — S82421D Displaced transverse fracture of shaft of right fibula, subsequent encounter for closed fracture with routine healing: Secondary | ICD-10-CM | POA: Diagnosis not present

## 2018-05-01 DIAGNOSIS — G8929 Other chronic pain: Secondary | ICD-10-CM | POA: Diagnosis not present

## 2018-05-02 ENCOUNTER — Ambulatory Visit (INDEPENDENT_AMBULATORY_CARE_PROVIDER_SITE_OTHER): Payer: Medicare Other | Admitting: Pharmacist

## 2018-05-02 DIAGNOSIS — Z7901 Long term (current) use of anticoagulants: Secondary | ICD-10-CM

## 2018-05-02 DIAGNOSIS — S82101D Unspecified fracture of upper end of right tibia, subsequent encounter for closed fracture with routine healing: Secondary | ICD-10-CM | POA: Diagnosis not present

## 2018-05-02 DIAGNOSIS — E1140 Type 2 diabetes mellitus with diabetic neuropathy, unspecified: Secondary | ICD-10-CM | POA: Diagnosis not present

## 2018-05-02 DIAGNOSIS — I130 Hypertensive heart and chronic kidney disease with heart failure and stage 1 through stage 4 chronic kidney disease, or unspecified chronic kidney disease: Secondary | ICD-10-CM | POA: Diagnosis not present

## 2018-05-02 DIAGNOSIS — M810 Age-related osteoporosis without current pathological fracture: Secondary | ICD-10-CM | POA: Diagnosis not present

## 2018-05-02 DIAGNOSIS — E1122 Type 2 diabetes mellitus with diabetic chronic kidney disease: Secondary | ICD-10-CM | POA: Diagnosis not present

## 2018-05-02 DIAGNOSIS — M150 Primary generalized (osteo)arthritis: Secondary | ICD-10-CM | POA: Diagnosis not present

## 2018-05-02 DIAGNOSIS — I480 Paroxysmal atrial fibrillation: Secondary | ICD-10-CM | POA: Diagnosis not present

## 2018-05-02 DIAGNOSIS — N183 Chronic kidney disease, stage 3 (moderate): Secondary | ICD-10-CM | POA: Diagnosis not present

## 2018-05-02 DIAGNOSIS — I5032 Chronic diastolic (congestive) heart failure: Secondary | ICD-10-CM | POA: Diagnosis not present

## 2018-05-02 DIAGNOSIS — S82421D Displaced transverse fracture of shaft of right fibula, subsequent encounter for closed fracture with routine healing: Secondary | ICD-10-CM | POA: Diagnosis not present

## 2018-05-02 DIAGNOSIS — S82001D Unspecified fracture of right patella, subsequent encounter for closed fracture with routine healing: Secondary | ICD-10-CM | POA: Diagnosis not present

## 2018-05-02 DIAGNOSIS — S82221D Displaced transverse fracture of shaft of right tibia, subsequent encounter for closed fracture with routine healing: Secondary | ICD-10-CM | POA: Diagnosis not present

## 2018-05-02 DIAGNOSIS — I2510 Atherosclerotic heart disease of native coronary artery without angina pectoris: Secondary | ICD-10-CM | POA: Diagnosis not present

## 2018-05-02 LAB — POCT INR: INR: 2.5

## 2018-05-03 DIAGNOSIS — M150 Primary generalized (osteo)arthritis: Secondary | ICD-10-CM | POA: Diagnosis not present

## 2018-05-03 DIAGNOSIS — E1122 Type 2 diabetes mellitus with diabetic chronic kidney disease: Secondary | ICD-10-CM | POA: Diagnosis not present

## 2018-05-03 DIAGNOSIS — M810 Age-related osteoporosis without current pathological fracture: Secondary | ICD-10-CM | POA: Diagnosis not present

## 2018-05-03 DIAGNOSIS — S82101D Unspecified fracture of upper end of right tibia, subsequent encounter for closed fracture with routine healing: Secondary | ICD-10-CM | POA: Diagnosis not present

## 2018-05-03 DIAGNOSIS — I480 Paroxysmal atrial fibrillation: Secondary | ICD-10-CM | POA: Diagnosis not present

## 2018-05-03 DIAGNOSIS — S82421D Displaced transverse fracture of shaft of right fibula, subsequent encounter for closed fracture with routine healing: Secondary | ICD-10-CM | POA: Diagnosis not present

## 2018-05-03 DIAGNOSIS — I5032 Chronic diastolic (congestive) heart failure: Secondary | ICD-10-CM | POA: Diagnosis not present

## 2018-05-03 DIAGNOSIS — I2510 Atherosclerotic heart disease of native coronary artery without angina pectoris: Secondary | ICD-10-CM | POA: Diagnosis not present

## 2018-05-03 DIAGNOSIS — S82001D Unspecified fracture of right patella, subsequent encounter for closed fracture with routine healing: Secondary | ICD-10-CM | POA: Diagnosis not present

## 2018-05-03 DIAGNOSIS — E1140 Type 2 diabetes mellitus with diabetic neuropathy, unspecified: Secondary | ICD-10-CM | POA: Diagnosis not present

## 2018-05-03 DIAGNOSIS — S82221D Displaced transverse fracture of shaft of right tibia, subsequent encounter for closed fracture with routine healing: Secondary | ICD-10-CM | POA: Diagnosis not present

## 2018-05-03 DIAGNOSIS — I130 Hypertensive heart and chronic kidney disease with heart failure and stage 1 through stage 4 chronic kidney disease, or unspecified chronic kidney disease: Secondary | ICD-10-CM | POA: Diagnosis not present

## 2018-05-03 DIAGNOSIS — N183 Chronic kidney disease, stage 3 (moderate): Secondary | ICD-10-CM | POA: Diagnosis not present

## 2018-05-05 DIAGNOSIS — S82221D Displaced transverse fracture of shaft of right tibia, subsequent encounter for closed fracture with routine healing: Secondary | ICD-10-CM | POA: Diagnosis not present

## 2018-05-05 DIAGNOSIS — I480 Paroxysmal atrial fibrillation: Secondary | ICD-10-CM | POA: Diagnosis not present

## 2018-05-05 DIAGNOSIS — E1140 Type 2 diabetes mellitus with diabetic neuropathy, unspecified: Secondary | ICD-10-CM | POA: Diagnosis not present

## 2018-05-05 DIAGNOSIS — N183 Chronic kidney disease, stage 3 (moderate): Secondary | ICD-10-CM | POA: Diagnosis not present

## 2018-05-05 DIAGNOSIS — I2510 Atherosclerotic heart disease of native coronary artery without angina pectoris: Secondary | ICD-10-CM | POA: Diagnosis not present

## 2018-05-05 DIAGNOSIS — S82421D Displaced transverse fracture of shaft of right fibula, subsequent encounter for closed fracture with routine healing: Secondary | ICD-10-CM | POA: Diagnosis not present

## 2018-05-05 DIAGNOSIS — I130 Hypertensive heart and chronic kidney disease with heart failure and stage 1 through stage 4 chronic kidney disease, or unspecified chronic kidney disease: Secondary | ICD-10-CM | POA: Diagnosis not present

## 2018-05-05 DIAGNOSIS — E1122 Type 2 diabetes mellitus with diabetic chronic kidney disease: Secondary | ICD-10-CM | POA: Diagnosis not present

## 2018-05-05 DIAGNOSIS — S82001D Unspecified fracture of right patella, subsequent encounter for closed fracture with routine healing: Secondary | ICD-10-CM | POA: Diagnosis not present

## 2018-05-05 DIAGNOSIS — I5032 Chronic diastolic (congestive) heart failure: Secondary | ICD-10-CM | POA: Diagnosis not present

## 2018-05-05 DIAGNOSIS — M810 Age-related osteoporosis without current pathological fracture: Secondary | ICD-10-CM | POA: Diagnosis not present

## 2018-05-05 DIAGNOSIS — S82101D Unspecified fracture of upper end of right tibia, subsequent encounter for closed fracture with routine healing: Secondary | ICD-10-CM | POA: Diagnosis not present

## 2018-05-05 DIAGNOSIS — M150 Primary generalized (osteo)arthritis: Secondary | ICD-10-CM | POA: Diagnosis not present

## 2018-05-08 DIAGNOSIS — I5032 Chronic diastolic (congestive) heart failure: Secondary | ICD-10-CM | POA: Diagnosis not present

## 2018-05-08 DIAGNOSIS — I480 Paroxysmal atrial fibrillation: Secondary | ICD-10-CM | POA: Diagnosis not present

## 2018-05-08 DIAGNOSIS — M810 Age-related osteoporosis without current pathological fracture: Secondary | ICD-10-CM | POA: Diagnosis not present

## 2018-05-08 DIAGNOSIS — E1122 Type 2 diabetes mellitus with diabetic chronic kidney disease: Secondary | ICD-10-CM | POA: Diagnosis not present

## 2018-05-08 DIAGNOSIS — S82421D Displaced transverse fracture of shaft of right fibula, subsequent encounter for closed fracture with routine healing: Secondary | ICD-10-CM | POA: Diagnosis not present

## 2018-05-08 DIAGNOSIS — E1140 Type 2 diabetes mellitus with diabetic neuropathy, unspecified: Secondary | ICD-10-CM | POA: Diagnosis not present

## 2018-05-08 DIAGNOSIS — N183 Chronic kidney disease, stage 3 (moderate): Secondary | ICD-10-CM | POA: Diagnosis not present

## 2018-05-08 DIAGNOSIS — M150 Primary generalized (osteo)arthritis: Secondary | ICD-10-CM | POA: Diagnosis not present

## 2018-05-08 DIAGNOSIS — S82101D Unspecified fracture of upper end of right tibia, subsequent encounter for closed fracture with routine healing: Secondary | ICD-10-CM | POA: Diagnosis not present

## 2018-05-08 DIAGNOSIS — I2510 Atherosclerotic heart disease of native coronary artery without angina pectoris: Secondary | ICD-10-CM | POA: Diagnosis not present

## 2018-05-08 DIAGNOSIS — I130 Hypertensive heart and chronic kidney disease with heart failure and stage 1 through stage 4 chronic kidney disease, or unspecified chronic kidney disease: Secondary | ICD-10-CM | POA: Diagnosis not present

## 2018-05-08 DIAGNOSIS — S82001D Unspecified fracture of right patella, subsequent encounter for closed fracture with routine healing: Secondary | ICD-10-CM | POA: Diagnosis not present

## 2018-05-08 DIAGNOSIS — S82221D Displaced transverse fracture of shaft of right tibia, subsequent encounter for closed fracture with routine healing: Secondary | ICD-10-CM | POA: Diagnosis not present

## 2018-05-09 DIAGNOSIS — S82221D Displaced transverse fracture of shaft of right tibia, subsequent encounter for closed fracture with routine healing: Secondary | ICD-10-CM | POA: Diagnosis not present

## 2018-05-09 DIAGNOSIS — M810 Age-related osteoporosis without current pathological fracture: Secondary | ICD-10-CM | POA: Diagnosis not present

## 2018-05-09 DIAGNOSIS — I5032 Chronic diastolic (congestive) heart failure: Secondary | ICD-10-CM | POA: Diagnosis not present

## 2018-05-09 DIAGNOSIS — S82421D Displaced transverse fracture of shaft of right fibula, subsequent encounter for closed fracture with routine healing: Secondary | ICD-10-CM | POA: Diagnosis not present

## 2018-05-09 DIAGNOSIS — I480 Paroxysmal atrial fibrillation: Secondary | ICD-10-CM | POA: Diagnosis not present

## 2018-05-09 DIAGNOSIS — E1122 Type 2 diabetes mellitus with diabetic chronic kidney disease: Secondary | ICD-10-CM | POA: Diagnosis not present

## 2018-05-09 DIAGNOSIS — E1140 Type 2 diabetes mellitus with diabetic neuropathy, unspecified: Secondary | ICD-10-CM | POA: Diagnosis not present

## 2018-05-09 DIAGNOSIS — S82101D Unspecified fracture of upper end of right tibia, subsequent encounter for closed fracture with routine healing: Secondary | ICD-10-CM | POA: Diagnosis not present

## 2018-05-09 DIAGNOSIS — N183 Chronic kidney disease, stage 3 (moderate): Secondary | ICD-10-CM | POA: Diagnosis not present

## 2018-05-09 DIAGNOSIS — I130 Hypertensive heart and chronic kidney disease with heart failure and stage 1 through stage 4 chronic kidney disease, or unspecified chronic kidney disease: Secondary | ICD-10-CM | POA: Diagnosis not present

## 2018-05-09 DIAGNOSIS — M150 Primary generalized (osteo)arthritis: Secondary | ICD-10-CM | POA: Diagnosis not present

## 2018-05-09 DIAGNOSIS — S82001D Unspecified fracture of right patella, subsequent encounter for closed fracture with routine healing: Secondary | ICD-10-CM | POA: Diagnosis not present

## 2018-05-09 DIAGNOSIS — I2510 Atherosclerotic heart disease of native coronary artery without angina pectoris: Secondary | ICD-10-CM | POA: Diagnosis not present

## 2018-05-10 DIAGNOSIS — M150 Primary generalized (osteo)arthritis: Secondary | ICD-10-CM | POA: Diagnosis not present

## 2018-05-10 DIAGNOSIS — I480 Paroxysmal atrial fibrillation: Secondary | ICD-10-CM | POA: Diagnosis not present

## 2018-05-10 DIAGNOSIS — S82001D Unspecified fracture of right patella, subsequent encounter for closed fracture with routine healing: Secondary | ICD-10-CM | POA: Diagnosis not present

## 2018-05-10 DIAGNOSIS — I130 Hypertensive heart and chronic kidney disease with heart failure and stage 1 through stage 4 chronic kidney disease, or unspecified chronic kidney disease: Secondary | ICD-10-CM | POA: Diagnosis not present

## 2018-05-10 DIAGNOSIS — I2510 Atherosclerotic heart disease of native coronary artery without angina pectoris: Secondary | ICD-10-CM | POA: Diagnosis not present

## 2018-05-10 DIAGNOSIS — I5032 Chronic diastolic (congestive) heart failure: Secondary | ICD-10-CM | POA: Diagnosis not present

## 2018-05-10 DIAGNOSIS — S82421D Displaced transverse fracture of shaft of right fibula, subsequent encounter for closed fracture with routine healing: Secondary | ICD-10-CM | POA: Diagnosis not present

## 2018-05-10 DIAGNOSIS — M810 Age-related osteoporosis without current pathological fracture: Secondary | ICD-10-CM | POA: Diagnosis not present

## 2018-05-10 DIAGNOSIS — E1122 Type 2 diabetes mellitus with diabetic chronic kidney disease: Secondary | ICD-10-CM | POA: Diagnosis not present

## 2018-05-10 DIAGNOSIS — E1140 Type 2 diabetes mellitus with diabetic neuropathy, unspecified: Secondary | ICD-10-CM | POA: Diagnosis not present

## 2018-05-10 DIAGNOSIS — N183 Chronic kidney disease, stage 3 (moderate): Secondary | ICD-10-CM | POA: Diagnosis not present

## 2018-05-10 DIAGNOSIS — S82101D Unspecified fracture of upper end of right tibia, subsequent encounter for closed fracture with routine healing: Secondary | ICD-10-CM | POA: Diagnosis not present

## 2018-05-10 DIAGNOSIS — S82221D Displaced transverse fracture of shaft of right tibia, subsequent encounter for closed fracture with routine healing: Secondary | ICD-10-CM | POA: Diagnosis not present

## 2018-05-11 ENCOUNTER — Ambulatory Visit (INDEPENDENT_AMBULATORY_CARE_PROVIDER_SITE_OTHER): Payer: Medicare Other

## 2018-05-11 ENCOUNTER — Ambulatory Visit (INDEPENDENT_AMBULATORY_CARE_PROVIDER_SITE_OTHER): Payer: Medicare Other | Admitting: Family Medicine

## 2018-05-11 ENCOUNTER — Encounter: Payer: Self-pay | Admitting: Family Medicine

## 2018-05-11 VITALS — BP 119/62 | HR 60 | Temp 98.6°F

## 2018-05-11 DIAGNOSIS — R0602 Shortness of breath: Secondary | ICD-10-CM

## 2018-05-11 DIAGNOSIS — J90 Pleural effusion, not elsewhere classified: Secondary | ICD-10-CM | POA: Diagnosis not present

## 2018-05-11 DIAGNOSIS — J181 Lobar pneumonia, unspecified organism: Secondary | ICD-10-CM

## 2018-05-11 DIAGNOSIS — R05 Cough: Secondary | ICD-10-CM | POA: Diagnosis not present

## 2018-05-11 DIAGNOSIS — J189 Pneumonia, unspecified organism: Secondary | ICD-10-CM

## 2018-05-11 DIAGNOSIS — R059 Cough, unspecified: Secondary | ICD-10-CM

## 2018-05-11 MED ORDER — CEFDINIR 300 MG PO CAPS: 300.0000 mg | ORAL_CAPSULE | Freq: Two times a day (BID) | ORAL | 0 refills | Status: DC

## 2018-05-11 MED ORDER — AZITHROMYCIN 250 MG PO TABS: 250.0000 mg | ORAL_TABLET | Freq: Every day | ORAL | 0 refills | Status: DC

## 2018-05-11 NOTE — Patient Instructions (Addendum)
Thank you for coming in today. Start omnicef twice daily.  Start azithomycin daily.  Recheck Monday.  Return sooner if needed.    Community-Acquired Pneumonia, Adult Pneumonia is an infection of the lungs. There are different types of pneumonia. One type can develop while a person is in a hospital. A different type, called community-acquired pneumonia, develops in people who are not, or have not recently been, in the hospital or other health care facility. What are the causes? Pneumonia may be caused by bacteria, viruses, or funguses. Community-acquired pneumonia is often caused by Streptococcus pneumonia bacteria. These bacteria are often passed from one person to another by breathing in droplets from the cough or sneeze of an infected person. What increases the risk? The condition is more likely to develop in:  People who havechronic diseases, such as chronic obstructive pulmonary disease (COPD), asthma, congestive heart failure, cystic fibrosis, diabetes, or kidney disease.  People who haveearly-stage or late-stage HIV.  People who havesickle cell disease.  People who havehad their spleen removed (splenectomy).  People who havepoor Human resources officer.  People who havemedical conditions that increase the risk of breathing in (aspirating) secretions their own mouth and nose.  People who havea weakened immune system (immunocompromised).  People who smoke.  People whotravel to areas where pneumonia-causing germs commonly exist.  People whoare around animal habitats or animals that have pneumonia-causing germs, including birds, bats, rabbits, cats, and farm animals.  What are the signs or symptoms? Symptoms of this condition include:  Adry cough.  A wet (productive) cough.  Fever.  Sweating.  Chest pain, especially when breathing deeply or coughing.  Rapid breathing or difficulty breathing.  Shortness of breath.  Shaking chills.  Fatigue.  Muscle  aches.  How is this diagnosed? Your health care provider will take a medical history and perform a physical exam. You may also have other tests, including:  Imaging studies of your chest, including X-rays.  Tests to check your blood oxygen level and other blood gases.  Other tests on blood, mucus (sputum), fluid around your lungs (pleural fluid), and urine.  If your pneumonia is severe, other tests may be done to identify the specific cause of your illness. How is this treated? The type of treatment that you receive depends on many factors, such as the cause of your pneumonia, the medicines you take, and other medical conditions that you have. For most adults, treatment and recovery from pneumonia may occur at home. In some cases, treatment must happen in a hospital. Treatment may include:  Antibiotic medicines, if the pneumonia was caused by bacteria.  Antiviral medicines, if the pneumonia was caused by a virus.  Medicines that are given by mouth or through an IV tube.  Oxygen.  Respiratory therapy.  Although rare, treating severe pneumonia may include:  Mechanical ventilation. This is done if you are not breathing well on your own and you cannot maintain a safe blood oxygen level.  Thoracentesis. This procedureremoves fluid around one lung or both lungs to help you breathe better.  Follow these instructions at home:  Take over-the-counter and prescription medicines only as told by your health care provider. ? Only takecough medicine if you are losing sleep. Understand that cough medicine can prevent your body's natural ability to remove mucus from your lungs. ? If you were prescribed an antibiotic medicine, take it as told by your health care provider. Do not stop taking the antibiotic even if you start to feel better.  Sleep in a semi-upright position  at night. Try sleeping in a reclining chair, or place a few pillows under your head.  Do not use tobacco products,  including cigarettes, chewing tobacco, and e-cigarettes. If you need help quitting, ask your health care provider.  Drink enough water to keep your urine clear or pale yellow. This will help to thin out mucus secretions in your lungs. How is this prevented? There are ways that you can decrease your risk of developing community-acquired pneumonia. Consider getting a pneumococcal vaccine if:  You are older than 82 years of age.  You are older than 82 years of age and are undergoing cancer treatment, have chronic lung disease, or have other medical conditions that affect your immune system. Ask your health care provider if this applies to you.  There are different types and schedules of pneumococcal vaccines. Ask your health care provider which vaccination option is best for you. You may also prevent community-acquired pneumonia if you take these actions:  Get an influenza vaccine every year. Ask your health care provider which type of influenza vaccine is best for you.  Go to the dentist on a regular basis.  Wash your hands often. Use hand sanitizer if soap and water are not available.  Contact a health care provider if:  You have a fever.  You are losing sleep because you cannot control your cough with cough medicine. Get help right away if:  You have worsening shortness of breath.  You have increased chest pain.  Your sickness becomes worse, especially if you are an older adult or have a weakened immune system.  You cough up blood. This information is not intended to replace advice given to you by your health care provider. Make sure you discuss any questions you have with your health care provider. Document Released: 12/13/2005 Document Revised: 04/22/2016 Document Reviewed: 04/09/2015 Elsevier Interactive Patient Education  [employer].

## 2018-05-12 ENCOUNTER — Telehealth: Payer: Self-pay

## 2018-05-12 ENCOUNTER — Telehealth: Payer: Self-pay | Admitting: Pharmacist Clinician (PhC)/ Clinical Pharmacy Specialist

## 2018-05-12 LAB — COMPLETE METABOLIC PANEL WITH GFR
AG Ratio: 1.4 (calc) (ref 1.0–2.5)
ALBUMIN MSPROF: 3.8 g/dL (ref 3.6–5.1)
ALKALINE PHOSPHATASE (APISO): 123 U/L (ref 33–130)
ALT: 32 U/L — ABNORMAL HIGH (ref 6–29)
AST: 43 U/L — AB (ref 10–35)
BUN/Creatinine Ratio: 15 (calc) (ref 6–22)
BUN: 20 mg/dL (ref 7–25)
CALCIUM: 9.3 mg/dL (ref 8.6–10.4)
CO2: 25 mmol/L (ref 20–32)
CREATININE: 1.31 mg/dL — AB (ref 0.60–0.88)
Chloride: 107 mmol/L (ref 98–110)
GFR, EST NON AFRICAN AMERICAN: 36 mL/min/{1.73_m2} — AB (ref >=60)
GFR, Est African American: 42 mL/min/{1.73_m2} — ABNORMAL LOW (ref >=60)
GLUCOSE: 81 mg/dL (ref 65–99)
Globulin: 2.7 g/dL (calc) (ref 1.9–3.7)
Potassium: 4.5 mmol/L (ref 3.5–5.3)
Sodium: 143 mmol/L (ref 135–146)
Total Bilirubin: 0.7 mg/dL (ref 0.2–1.2)
Total Protein: 6.5 g/dL (ref 6.1–8.1)

## 2018-05-12 LAB — CBC WITH DIFFERENTIAL/PLATELET
Basophils Absolute: 150 cells/uL (ref 0–200)
Basophils Relative: 1.5 %
EOS PCT: 1.5 %
Eosinophils Absolute: 150 cells/uL (ref 15–500)
HCT: 35.8 % (ref 35.0–45.0)
HEMOGLOBIN: 11.8 g/dL (ref 11.7–15.5)
LYMPHS ABS: 1120 {cells}/uL (ref 850–3900)
MCH: 30.8 pg (ref 27.0–33.0)
MCHC: 33 g/dL (ref 32.0–36.0)
MCV: 93.5 fL (ref 80.0–100.0)
MONOS PCT: 10.3 %
MPV: 10.6 fL (ref 7.5–12.5)
NEUTROS ABS: 7550 {cells}/uL (ref 1500–7800)
Neutrophils Relative %: 75.5 %
Platelets: 287 10*3/uL (ref 140–400)
RBC: 3.83 10*6/uL (ref 3.80–5.10)
RDW: 13.1 % (ref 11.0–15.0)
Total Lymphocyte: 11.2 %
WBC mixed population: 1030 cells/uL — ABNORMAL HIGH (ref 200–950)
WBC: 10 10*3/uL (ref 3.8–10.8)

## 2018-05-12 NOTE — Telephone Encounter (Signed)
Daughter Carmel [place] called to report that patient has developed pneumonia.  She was given 2 antibiotics yesterday, Zpak and cefdinir 300 mg.   Home health is due to check her INR Tuesday next week, but spoke with RN Tabitha and will have drawn on Monday May 20 instead.     Daughter aware of issue

## 2018-05-12 NOTE — Progress Notes (Signed)
[name] is a 84 y.o. female who presents to [facility] today for cough.  Aunya notes a several day history of cough congestion.  This is associated with mild shortness of breath.  She denies fever vomiting chest pain or palpitations.  She denies any leg swelling or rapid weight gain.  Her medical history is significant for multiple serious comorbid factors including chronic diastolic heart failure, disease, chronic kidney disease, and chronic osteomyelitis.  She feels poorly but feels well enough to stay at home at this time.  She has family members helping her today and attending to her at home.   ROS as above:  Exam:  BP 119/62   Pulse 60   Temp 98.6 F (37 C) (Oral)   SpO2 96%  Gen: Well NAD nontoxic appearing HEENT: EOMI,  MMM Lungs: Normal work of breathing.  Coarse breath sounds right lung fields. Heart: Heart regular rate no MRG Abd: NABS, Soft. Nondistended, Nontender Exts: Brisk capillary refill, warm and well perfused.   Lab and Radiology Results Chest x-ray personal independent review of images concerning for filtrate right lower lung fields versus pleural effusion.  Pacemaker and mild cardiomegaly present but chronic and unchanged.  Awaiting formal radiology review.  Assessment and Plan: 84 y.o. female with community-acquired pneumonia per my read today.  Awaiting formal radiology review of x-rays.  Patient is overall in poor health risk for worsening.  Will obtain CBC and metabolic panel to help with risk assessment.  Treat empirically with Omnicef and azithromycin.  Creatinine checked in February 2019, and within normal prescribing limits for the doses I prescribed.  Recheck with PCP on Monday (5/20/).  Precautions reviewed return sooner if needed.    Orders Placed This Encounter  Procedures  . DG Chest 2 View    Order Specific Question:    Reason for exam:    Answer:   Cough, assess intra-thoracic pathology    Order Specific Question:   Preferred imaging location?    Answer:   [facility]  . CBC with Differential/Platelet  . COMPLETE METABOLIC PANEL WITH GFR   Meds ordered this encounter  Medications  . azithromycin (ZITHROMAX) 250 MG tablet    Sig: Take 1 tablet (250 mg total) by mouth daily. Take first 2 tablets together, then 1 every day until finished.    Dispense:  6 tablet    Refill:  0  . cefdinir (OMNICEF) 300 MG capsule    Sig: Take 1 capsule (300 mg total) by mouth 2 (two) times daily.    Dispense:  14 capsule    Refill:  0     Historical information moved to improve visibility of documentation.  Past Medical History:  Diagnosis Date  . Anemia    Pernicious  . Atrial fibrillation ([place]) 1/11    Dr Gwenlyn Found  . Back pain   . Chronic anticoagulation    on coumadin  . Chronic kidney disease    chronic, stage III  . Complete heart block ([place]) 01/31/2015  . Cystitis   . Diabetes mellitus    w/neuro manifestations, type II  . Diarrhea   . Esophageal stricture   . GERD (gastroesophageal reflux disease)   . H/O cardiac catheterization 2010   normal coronary arteries, EF 60%  . Heart failure    chronic diatolic- 2 D echo 2/97 EF 60%  . Heart murmur   . Hiatal hernia   . History of  kidney stones   . Hx of echocardiogram 04/04/13   EF 55-60%, mild MR  . Hyperlipidemia   . Hypertension   . IBS (irritable bowel syndrome)   . Memory loss   . Microalbuminuria   . Non-stress test nonreactive 11/14   Lexiscan myoview negative for ischemia  . Osteoarthrosis involving multiple sites   . PAF (paroxysmal atrial fibrillation) ([place])    Last DCCV 12/29/2009  . Pancreatitis   . Peptic ulcer    unspec. w/o obstruction  . RBBB   . Senile osteoporosis   . Swelling    limb, left more than right ankle   . Symptomatic bradycardia 01/2010   CHB, temp pacer and then Medtronic PPM  . Thyroid disease   .  Tubular adenoma of colon   . Ulcer    Gastric   Past Surgical History:  Procedure Laterality Date  . APPENDECTOMY    . CARDIAC CATHETERIZATION  2010   normal coronary arteries  . carotid dopplers- no significant stenosis  2007  . CATARACT EXTRACTION     both eyes  . EGD- esophageal stricture     gastric ulcer  . pacemaker placement  2/11   medtronic dual chamber  . TONSILLECTOMY AND ADENOIDECTOMY     Social History   Tobacco Use  . Smoking status: Never Smoker  . Smokeless tobacco: Never Used  Substance Use Topics  . Alcohol use: No   family history includes Alzheimer's disease in her sister; Colon cancer in her sister; Diabetes in her mother; Heart attack in her father; Heart disease in her brother; Prostate cancer in her father.  Medications: Current Outpatient Medications  Medication Sig Dispense Refill  . ACCU-CHEK COMPACT PLUS test strip USE TO CHECK BLOOD SUGAR TWICE DAILY 100 each 11  . AMBULATORY NON FORMULARY MEDICATION Medication Name: Asked to [facility] home [facility].  Attention Tabatha.  Need BMP on Ms. Pautsch time of the week of February 18.  Diagnosis: medication management, lower extremity edema 1 vial 0  . AMBULATORY NON FORMULARY MEDICATION Medication Name: Please contact [facility] home care to do an overnight pulse oximetry.  Diagnosis of shortness of breath. 1 vial 0  . AMBULATORY NON FORMULARY MEDICATION Overnight O2, 2 liters nightly, with supplies. Stationary gas. 1 each prn  . AQUALANCE LANCETS 30G MISC     . ascorbic acid (VITAMIN C) 500 MG tablet Take 500 mg by mouth 3 (three) times daily with meals.    Marland Kitchen b complex vitamins capsule Take 1 capsule by mouth daily.    . bifidobacterium infantis (ALIGN) capsule 1 capsule by mouth daily 30 capsule 11  . Blood Glucose Calibration (OT ULTRA/FASTTK CNTRL SOLN) SOLN     . calcium citrate (CALCITRATE - DOSED IN MG ELEMENTAL CALCIUM) 950 MG tablet Take 950 mg by mouth daily.    . clotrimazole (LOTRIMIN) 1 % cream Apply  1 application topically 2 (two) times daily. Apply to affected areas twice daily 60 g 1  . donepezil (ARICEPT) 10 MG tablet Take 1 tablet (10 mg total) by mouth at bedtime. 90 tablet 1  . furosemide (LASIX) 40 MG tablet TAKE 1 TABLET (40 MG TOTAL) BY MOUTH DAILY AS NEEDED. 10 tablet 0  . gabapentin (NEURONTIN) 600 MG tablet TAKE 1 TABLET BY MOUTH EVERY MORNING, 2 TABLETS AT DINNER, AND 3 TABLETS NIGHTLY AT BEDTIME. 540 tablet 1  . HYDROcodone-acetaminophen (NORCO/VICODIN) 5-325 MG tablet Take 1 tablet by mouth every 6 (six) hours as needed. Take with food 40 tablet  0  . Insulin Glargine (LANTUS SOLOSTAR) 100 UNIT/ML Solostar Pen INJECT 28 UNITS INTO THE SKIN 2 (TWO) TIMES DAILY. 54 mL 3  . insulin regular (HUMULIN R) 100 units/mL injection 5 units SQ 10 minutes before evening meal. 10 mL 1  . Insulin Syringe-Needle U-100 (EASY COMFORT INSULIN SYRINGE) 30G X 5/16" 0.5 ML MISC Use to inject insulin. Dx:E 100 each 11  . JANUVIA 100 MG tablet Take 1 tablet by mouth daily.    Marland Kitchen levothyroxine (SYNTHROID, LEVOTHROID) 100 MCG tablet TAKE ONE TABLET BY MOUTH DAILY 90 tablet 2  . losartan (COZAAR) 25 MG tablet Take 1 tablet (25 mg total) by mouth daily. 90 tablet 1  . memantine (NAMENDA) 5 MG tablet TAKE 1 TABLET BY MOUTH AT BEDTIME 90 tablet 1  . metoprolol tartrate (LOPRESSOR) 25 MG tablet TAKE 1 TABLET (25 MG TOTAL) BY MOUTH 2 (TWO) TIMES DAILY. 180 tablet 3  . Multiple Vitamins-Minerals (CERTAVITE SENIOR/ANTIOXIDANT) TABS Take 1 tablet by mouth daily.    . pantoprazole (PROTONIX) 40 MG tablet TAKE 1 TABLET (40 MG TOTAL) BY MOUTH DAILY. 90 tablet 3  . penicillin v potassium (VEETID) 250 MG tablet Take 250 mg by mouth 3 (three) times daily.  5  . pioglitazone (ACTOS) 15 MG tablet TAKE 1 TABLET BY MOUTH EVERY DAY 90 tablet 1  . pravastatin (PRAVACHOL) 40 MG tablet Take 40 mg by mouth at bedtime.  3  . simvastatin (ZOCOR) 40 MG tablet Take 1/2 tab at bedtime. 90 tablet 1  . warfarin (COUMADIN) 2 MG tablet  Take 1 tablet by mouth daily as needed. 90 tablet 1  . warfarin (COUMADIN) 3 MG tablet Take 1 tablet by mouth daily as needed. 90 tablet 1  . zinc oxide 20 % ointment Apply 1 application topically 2 (two) times daily as needed for irritation (to external rectum and buttocks).    Marland Kitchen azithromycin (ZITHROMAX) 250 MG tablet Take 1 tablet (250 mg total) by mouth daily. Take first 2 tablets together, then 1 every day until finished. 6 tablet 0  . cefdinir (OMNICEF) 300 MG capsule Take 1 capsule (300 mg total) by mouth 2 (two) times daily. 14 capsule 0   No current facility-administered medications for this visit.    Allergies  Allergen Reactions  . Angiotensin Receptor Blockers Other (See Comments)    Renal dx.  Nephrology has recommended against use bc of hyperkalemia Other reaction(s): Other (See Comments) Renal dx.  Nephrology has recommended against use bc of hyperkalemia  . Ace Inhibitors     REACTION: cough  . Fosinopril Sodium     REACTION: Cough  . Glipizide Other (See Comments)    Hypoglycemia  . Metformin And Related Other (See Comments)    Renal function  . Sulfamethoxazole-Trimethoprim     REACTION: Unspecified    Health Maintenance Health Maintenance  Topic Date Due  . TETANUS/TDAP  10/03/2016  . FOOT EXAM  05/18/2018  . OPHTHALMOLOGY EXAM  05/25/2018  . HEMOGLOBIN A1C  07/25/2018  . INFLUENZA VACCINE  07/27/2018  . DEXA SCAN  Completed  . PNA vac Low Risk Adult  Completed    Discussed warning signs or symptoms. Please see discharge instructions. Patient expresses understanding.

## 2018-05-12 NOTE — Telephone Encounter (Signed)
No notes found

## 2018-05-15 ENCOUNTER — Ambulatory Visit (INDEPENDENT_AMBULATORY_CARE_PROVIDER_SITE_OTHER): Payer: Medicare Other | Admitting: Pharmacist

## 2018-05-15 ENCOUNTER — Telehealth: Payer: Self-pay

## 2018-05-15 ENCOUNTER — Ambulatory Visit (INDEPENDENT_AMBULATORY_CARE_PROVIDER_SITE_OTHER): Payer: Medicare Other | Admitting: Family Medicine

## 2018-05-15 ENCOUNTER — Encounter: Payer: Self-pay | Admitting: Family Medicine

## 2018-05-15 VITALS — BP 112/43 | HR 60 | Temp 98.7°F | Ht 67.0 in

## 2018-05-15 DIAGNOSIS — K8080 Other cholelithiasis without obstruction: Secondary | ICD-10-CM | POA: Diagnosis not present

## 2018-05-15 DIAGNOSIS — K8020 Calculus of gallbladder without cholecystitis without obstruction: Secondary | ICD-10-CM | POA: Diagnosis not present

## 2018-05-15 DIAGNOSIS — R0902 Hypoxemia: Secondary | ICD-10-CM

## 2018-05-15 DIAGNOSIS — J90 Pleural effusion, not elsewhere classified: Secondary | ICD-10-CM | POA: Diagnosis not present

## 2018-05-15 DIAGNOSIS — R1013 Epigastric pain: Secondary | ICD-10-CM | POA: Diagnosis not present

## 2018-05-15 DIAGNOSIS — Z888 Allergy status to other drugs, medicaments and biological substances status: Secondary | ICD-10-CM | POA: Diagnosis not present

## 2018-05-15 DIAGNOSIS — R059 Cough, unspecified: Secondary | ICD-10-CM

## 2018-05-15 DIAGNOSIS — M7989 Other specified soft tissue disorders: Secondary | ICD-10-CM | POA: Diagnosis not present

## 2018-05-15 DIAGNOSIS — N281 Cyst of kidney, acquired: Secondary | ICD-10-CM | POA: Diagnosis not present

## 2018-05-15 DIAGNOSIS — S81812A Laceration without foreign body, left lower leg, initial encounter: Secondary | ICD-10-CM | POA: Diagnosis not present

## 2018-05-15 DIAGNOSIS — Z7901 Long term (current) use of anticoagulants: Secondary | ICD-10-CM | POA: Diagnosis not present

## 2018-05-15 DIAGNOSIS — J9611 Chronic respiratory failure with hypoxia: Secondary | ICD-10-CM | POA: Diagnosis not present

## 2018-05-15 DIAGNOSIS — S82001D Unspecified fracture of right patella, subsequent encounter for closed fracture with routine healing: Secondary | ICD-10-CM | POA: Diagnosis not present

## 2018-05-15 DIAGNOSIS — I130 Hypertensive heart and chronic kidney disease with heart failure and stage 1 through stage 4 chronic kidney disease, or unspecified chronic kidney disease: Secondary | ICD-10-CM | POA: Diagnosis not present

## 2018-05-15 DIAGNOSIS — E039 Hypothyroidism, unspecified: Secondary | ICD-10-CM | POA: Diagnosis not present

## 2018-05-15 DIAGNOSIS — J9811 Atelectasis: Secondary | ICD-10-CM | POA: Diagnosis not present

## 2018-05-15 DIAGNOSIS — K529 Noninfective gastroenteritis and colitis, unspecified: Secondary | ICD-10-CM | POA: Diagnosis not present

## 2018-05-15 DIAGNOSIS — I2510 Atherosclerotic heart disease of native coronary artery without angina pectoris: Secondary | ICD-10-CM | POA: Diagnosis not present

## 2018-05-15 DIAGNOSIS — I083 Combined rheumatic disorders of mitral, aortic and tricuspid valves: Secondary | ICD-10-CM | POA: Diagnosis not present

## 2018-05-15 DIAGNOSIS — B348 Other viral infections of unspecified site: Secondary | ICD-10-CM | POA: Diagnosis not present

## 2018-05-15 DIAGNOSIS — I5033 Acute on chronic diastolic (congestive) heart failure: Secondary | ICD-10-CM | POA: Diagnosis not present

## 2018-05-15 DIAGNOSIS — J181 Lobar pneumonia, unspecified organism: Secondary | ICD-10-CM | POA: Diagnosis not present

## 2018-05-15 DIAGNOSIS — E1140 Type 2 diabetes mellitus with diabetic neuropathy, unspecified: Secondary | ICD-10-CM | POA: Diagnosis not present

## 2018-05-15 DIAGNOSIS — J156 Pneumonia due to other aerobic Gram-negative bacteria: Secondary | ICD-10-CM | POA: Diagnosis not present

## 2018-05-15 DIAGNOSIS — M150 Primary generalized (osteo)arthritis: Secondary | ICD-10-CM | POA: Diagnosis not present

## 2018-05-15 DIAGNOSIS — I480 Paroxysmal atrial fibrillation: Secondary | ICD-10-CM | POA: Diagnosis not present

## 2018-05-15 DIAGNOSIS — I498 Other specified cardiac arrhythmias: Secondary | ICD-10-CM | POA: Diagnosis not present

## 2018-05-15 DIAGNOSIS — S80812A Abrasion, left lower leg, initial encounter: Secondary | ICD-10-CM | POA: Diagnosis not present

## 2018-05-15 DIAGNOSIS — R1010 Upper abdominal pain, unspecified: Secondary | ICD-10-CM | POA: Diagnosis not present

## 2018-05-15 DIAGNOSIS — Z950 Presence of cardiac pacemaker: Secondary | ICD-10-CM | POA: Diagnosis not present

## 2018-05-15 DIAGNOSIS — N183 Chronic kidney disease, stage 3 (moderate): Secondary | ICD-10-CM | POA: Diagnosis not present

## 2018-05-15 DIAGNOSIS — T148XXA Other injury of unspecified body region, initial encounter: Secondary | ICD-10-CM | POA: Diagnosis not present

## 2018-05-15 DIAGNOSIS — R918 Other nonspecific abnormal finding of lung field: Secondary | ICD-10-CM | POA: Diagnosis not present

## 2018-05-15 DIAGNOSIS — R0602 Shortness of breath: Secondary | ICD-10-CM | POA: Diagnosis not present

## 2018-05-15 DIAGNOSIS — S82221D Displaced transverse fracture of shaft of right tibia, subsequent encounter for closed fracture with routine healing: Secondary | ICD-10-CM | POA: Diagnosis not present

## 2018-05-15 DIAGNOSIS — J189 Pneumonia, unspecified organism: Secondary | ICD-10-CM | POA: Diagnosis not present

## 2018-05-15 DIAGNOSIS — I350 Nonrheumatic aortic (valve) stenosis: Secondary | ICD-10-CM | POA: Diagnosis not present

## 2018-05-15 DIAGNOSIS — E1122 Type 2 diabetes mellitus with diabetic chronic kidney disease: Secondary | ICD-10-CM | POA: Diagnosis not present

## 2018-05-15 DIAGNOSIS — R188 Other ascites: Secondary | ICD-10-CM | POA: Diagnosis not present

## 2018-05-15 DIAGNOSIS — M810 Age-related osteoporosis without current pathological fracture: Secondary | ICD-10-CM | POA: Diagnosis not present

## 2018-05-15 DIAGNOSIS — S82421D Displaced transverse fracture of shaft of right fibula, subsequent encounter for closed fracture with routine healing: Secondary | ICD-10-CM | POA: Diagnosis not present

## 2018-05-15 DIAGNOSIS — I501 Left ventricular failure: Secondary | ICD-10-CM | POA: Diagnosis not present

## 2018-05-15 DIAGNOSIS — G301 Alzheimer's disease with late onset: Secondary | ICD-10-CM | POA: Diagnosis not present

## 2018-05-15 DIAGNOSIS — R1011 Right upper quadrant pain: Secondary | ICD-10-CM | POA: Diagnosis not present

## 2018-05-15 DIAGNOSIS — I447 Left bundle-branch block, unspecified: Secondary | ICD-10-CM | POA: Diagnosis not present

## 2018-05-15 DIAGNOSIS — S82101D Unspecified fracture of upper end of right tibia, subsequent encounter for closed fracture with routine healing: Secondary | ICD-10-CM | POA: Diagnosis not present

## 2018-05-15 DIAGNOSIS — I482 Chronic atrial fibrillation: Secondary | ICD-10-CM | POA: Diagnosis not present

## 2018-05-15 DIAGNOSIS — J9621 Acute and chronic respiratory failure with hypoxia: Secondary | ICD-10-CM | POA: Diagnosis not present

## 2018-05-15 DIAGNOSIS — N179 Acute kidney failure, unspecified: Secondary | ICD-10-CM | POA: Diagnosis not present

## 2018-05-15 DIAGNOSIS — R05 Cough: Secondary | ICD-10-CM | POA: Diagnosis not present

## 2018-05-15 DIAGNOSIS — I255 Ischemic cardiomyopathy: Secondary | ICD-10-CM | POA: Diagnosis not present

## 2018-05-15 DIAGNOSIS — E870 Hyperosmolality and hypernatremia: Secondary | ICD-10-CM | POA: Diagnosis not present

## 2018-05-15 DIAGNOSIS — K5790 Diverticulosis of intestine, part unspecified, without perforation or abscess without bleeding: Secondary | ICD-10-CM | POA: Diagnosis not present

## 2018-05-15 DIAGNOSIS — I442 Atrioventricular block, complete: Secondary | ICD-10-CM | POA: Diagnosis not present

## 2018-05-15 DIAGNOSIS — K219 Gastro-esophageal reflux disease without esophagitis: Secondary | ICD-10-CM | POA: Diagnosis not present

## 2018-05-15 DIAGNOSIS — I5032 Chronic diastolic (congestive) heart failure: Secondary | ICD-10-CM | POA: Diagnosis not present

## 2018-05-15 LAB — POCT INR: INR: 6

## 2018-05-15 NOTE — Progress Notes (Signed)
Subjective:    Patient ID: [name], female    DOB: 13-Sep-1928, 84 y.o.   MRN: [id number]  HPI 84 year old female with a history of coronary artery disease and atrial fibrillation is here today to follow-up for recent acute visit for cough, shortness of breath and change in mental status.  That she does have some slight dementia.  Chest x-ray showed pleural effusion with possible community-acquired pneumonia.  Follow-up was recommended which is why she is coming in today.  She was started on Omnicef and azithromycin. No longer on her insulin.  For the last 3 days she hasn't been eating. Only a couple of bites each day.  She has been sleeping most of the day.  She does cough intermittently and it sounds more wet and productive.  No fever or chills that they are aware of.  They have been giving her the antibiotic and the azithromycin but feels like she has been getting progressively worse over the weekend.  They tried to call the on-call nurse yesterday but says the phone system did not connect over.  She is also been having diarrhea over the last couple days.  Every time she coughs she stools on herself.  Home health did come out this morning and tried to draw an INR on her.  Fingerstick was elevated at 6.0 but they couldn't get a venupuncture.     Review of Systems  BP (!) 112/43   Pulse 60   Temp 98.7 F (37.1 C) (Oral)   Ht 5\' 7"  (1.702 m)   SpO2 (!) 87%   BMI 30.70 kg/m     Allergies  Allergen Reactions  . Angiotensin Receptor Blockers Other (See Comments)    Renal dx.  Nephrology has recommended against use bc of hyperkalemia Other reaction(s): Other (See Comments) Renal dx.  Nephrology has recommended against use bc of hyperkalemia  . Ace Inhibitors     REACTION: cough  . Fosinopril Sodium     REACTION: Cough  . Glipizide Other (See Comments)    Hypoglycemia  . Metformin And Related Other (See Comments)    Renal function  . Sulfamethoxazole-Trimethoprim     REACTION:  Unspecified    Past Medical History:  Diagnosis Date  . Anemia    Pernicious  . Atrial fibrillation ([place]) 1/11    Dr Gwenlyn Found  . Back pain   . Chronic anticoagulation    on coumadin  . Chronic kidney disease    chronic, stage III  . Complete heart block ([place]) 01/31/2015  . Cystitis   . Diabetes mellitus    w/neuro manifestations, type II  . Diarrhea   . Esophageal stricture   . GERD (gastroesophageal reflux disease)   . H/O cardiac catheterization 2010   normal coronary arteries, EF 60%  . Heart failure    chronic diatolic- 2 D echo 3/50 EF 60%  . Heart murmur   . Hiatal hernia   . History of kidney stones   . Hx of echocardiogram 04/04/13   EF 55-60%, mild MR  . Hyperlipidemia   . Hypertension   . IBS (irritable bowel syndrome)   . Memory loss   . Microalbuminuria   . Non-stress test nonreactive 11/14   Lexiscan myoview negative for ischemia  . Osteoarthrosis involving multiple sites   . PAF (paroxysmal atrial fibrillation) ([place])    Last DCCV 12/29/2009  . Pancreatitis   . Peptic ulcer    unspec. w/o obstruction  . RBBB   . Senile  osteoporosis   . Swelling    limb, left more than right ankle   . Symptomatic bradycardia 01/2010   CHB, temp pacer and then Medtronic PPM  . Thyroid disease   . Tubular adenoma of colon   . Ulcer    Gastric    Past Surgical History:  Procedure Laterality Date  . APPENDECTOMY    . CARDIAC CATHETERIZATION  2010   normal coronary arteries  . carotid dopplers- no significant stenosis  2007  . CATARACT EXTRACTION     both eyes  . EGD- esophageal stricture     gastric ulcer  . pacemaker placement  2/11   medtronic dual chamber  . TONSILLECTOMY AND ADENOIDECTOMY      Social History   Socioeconomic History  . Marital status: Widowed    Spouse name: Not on file  . Number of children: Not on file  . Years of education: Not on file  . Highest education level: Not on file  Occupational History  . Not on file  Social Needs  .  Financial resource strain: Not on file  . Food insecurity:    Worry: Not on file    Inability: Not on file  . Transportation needs:    Medical: Not on file    Non-medical: Not on file  Tobacco Use  . Smoking status: Never Smoker  . Smokeless tobacco: Never Used  Substance and Sexual Activity  . Alcohol use: No  . Drug use: No  . Sexual activity: Never  Lifestyle  . Physical activity:    Days per week: Not on file    Minutes per session: Not on file  . Stress: Not on file  Relationships  . Social connections:    Talks on phone: Not on file    Gets together: Not on file    Attends religious service: Not on file    Active member of club or organization: Not on file    Attends meetings of clubs or organizations: Not on file    Relationship status: Not on file  . Intimate partner violence:    Fear of current or ex partner: Not on file    Emotionally abused: Not on file    Physically abused: Not on file    Forced sexual activity: Not on file  Other Topics Concern  . Not on file  Social History Narrative  . Not on file    Family History  Problem Relation Age of Onset  . Diabetes Mother   . Prostate cancer Father   . Heart attack Father   . Colon cancer Sister   . Esophageal cancer Neg Hx   . Rectal cancer Neg Hx   . Stomach cancer Neg Hx   . Alzheimer's disease Sister   . Heart disease Brother     Outpatient Encounter Medications as of 05/15/2018  Medication Sig  . ACCU-CHEK COMPACT PLUS test strip USE TO CHECK BLOOD SUGAR TWICE DAILY  . AMBULATORY NON FORMULARY MEDICATION Medication Name: Asked to [facility] home [facility].  Attention Tabatha.  Need BMP on Ms. Nicholes time of the week of February 18.  Diagnosis: medication management, lower extremity edema  . AMBULATORY NON FORMULARY MEDICATION Medication Name: Please contact [facility] home care to do an overnight pulse oximetry.  Diagnosis of shortness of breath.  . AMBULATORY NON FORMULARY MEDICATION Overnight O2, 2 liters  nightly, with supplies. Stationary gas.  Marland Kitchen AQUALANCE LANCETS 30G MISC   . ascorbic acid (VITAMIN C) 500 MG tablet Take  500 mg by mouth 3 (three) times daily with meals.  Marland Kitchen azithromycin (ZITHROMAX) 250 MG tablet Take 1 tablet (250 mg total) by mouth daily. Take first 2 tablets together, then 1 every day until finished.  . b complex vitamins capsule Take 1 capsule by mouth daily.  . bifidobacterium infantis (ALIGN) capsule 1 capsule by mouth daily  . Blood Glucose Calibration (OT ULTRA/FASTTK CNTRL SOLN) SOLN   . calcium citrate (CALCITRATE - DOSED IN MG ELEMENTAL CALCIUM) 950 MG tablet Take 950 mg by mouth daily.  . cefdinir (OMNICEF) 300 MG capsule Take 1 capsule (300 mg total) by mouth 2 (two) times daily.  . clotrimazole (LOTRIMIN) 1 % cream Apply 1 application topically 2 (two) times daily. Apply to affected areas twice daily  . donepezil (ARICEPT) 10 MG tablet Take 1 tablet (10 mg total) by mouth at bedtime.  . furosemide (LASIX) 40 MG tablet TAKE 1 TABLET (40 MG TOTAL) BY MOUTH DAILY AS NEEDED.  Marland Kitchen gabapentin (NEURONTIN) 600 MG tablet TAKE 1 TABLET BY MOUTH EVERY MORNING, 2 TABLETS AT DINNER, AND 3 TABLETS NIGHTLY AT BEDTIME.  Marland Kitchen HYDROcodone-acetaminophen (NORCO/VICODIN) 5-325 MG tablet Take 1 tablet by mouth every 6 (six) hours as needed. Take with food  . insulin glargine (LANTUS) 100 UNIT/ML injection Inject into the skin. Inject 20 units into the the skin in the AM and 14 units into the skin in the PM  . insulin regular (HUMULIN R) 100 units/mL injection 5 units SQ 10 minutes before evening meal.  . Insulin Syringe-Needle U-100 (EASY COMFORT INSULIN SYRINGE) 30G X 5/16" 0.5 ML MISC Use to inject insulin. Dx:E  . levothyroxine (SYNTHROID, LEVOTHROID) 100 MCG tablet TAKE ONE TABLET BY MOUTH DAILY  . losartan (COZAAR) 25 MG tablet Take 1 tablet (25 mg total) by mouth daily.  . memantine (NAMENDA) 5 MG tablet TAKE 1 TABLET BY MOUTH AT BEDTIME  . metoprolol tartrate (LOPRESSOR) 25 MG tablet  TAKE 1 TABLET (25 MG TOTAL) BY MOUTH 2 (TWO) TIMES DAILY.  . Multiple Vitamins-Minerals (CERTAVITE SENIOR/ANTIOXIDANT) TABS Take 1 tablet by mouth daily.  . pantoprazole (PROTONIX) 40 MG tablet TAKE 1 TABLET (40 MG TOTAL) BY MOUTH DAILY.  Marland Kitchen penicillin v potassium (VEETID) 250 MG tablet Take 250 mg by mouth 3 (three) times daily.  . pioglitazone (ACTOS) 15 MG tablet TAKE 1 TABLET BY MOUTH EVERY DAY  . pravastatin (PRAVACHOL) 40 MG tablet Take 40 mg by mouth at bedtime.  Marland Kitchen warfarin (COUMADIN) 2 MG tablet Take 1 tablet by mouth daily as needed.  . warfarin (COUMADIN) 3 MG tablet Take 1 tablet by mouth daily as needed.  . zinc oxide 20 % ointment Apply 1 application topically 2 (two) times daily as needed for irritation (to external rectum and buttocks).  . [DISCONTINUED] Insulin Glargine (LANTUS SOLOSTAR) 100 UNIT/ML Solostar Pen INJECT 28 UNITS INTO THE SKIN 2 (TWO) TIMES DAILY.  . [DISCONTINUED] JANUVIA 100 MG tablet Take 1 tablet by mouth daily.  . [DISCONTINUED] simvastatin (ZOCOR) 40 MG tablet Take 1/2 tab at bedtime.   No facility-administered encounter medications on file as of 05/15/2018.          Objective:   Physical Exam  Constitutional: She is oriented to person, place, and time. She appears well-developed and well-nourished.  HENT:  Head: Normocephalic and atraumatic.  Facial edema with swelling under both eyes.   Cardiovascular: Normal rate, regular rhythm and normal heart sounds.  Pulmonary/Chest: Effort normal.  Pittore crackles bilaterally to mid lung.  Neurological: She  is alert and oriented to person, place, and time.  Skin: Skin is warm and dry.  Psychiatric: She has a normal mood and affect. Her behavior is normal.        Assessment & Plan:  CAP with hypoxemia & pleural effusion -she is clearly gotten worse over the last 3 days and she is now hypoxemic.  I am concerned that her pleural effusions are probably larger and she has some facial edema today even though  she is dehydrated which is very concerning for renal injury or even renal failure.  Discussed this with her both of her daughters who were here today.  Discussed that she needs to go to the emergency department immediately.  They are going to take her via car.  Even though she is hypoxemic she has no increased work of breathing and her diastolic pressure is low.  Very concerning for severe dehydration and may be even sepsis though she is not tachycardic.  I am very concerned that she may not recover from this.

## 2018-05-15 NOTE — Telephone Encounter (Signed)
[name] has an appointment this afternoon.   Tabetha with [facility], called and left a message stating her INR was elevated to 6 today. She states she was unable to draw blood and wanted to know if Dr Madilyn Fireman could put in an order to have her blood drawn down stairs. The value was reported to the coumadin clinic and patient was advised to hold coumadin for a couple of days. Patient is not eating or drinking well and has been on an antibiotic.

## 2018-05-16 ENCOUNTER — Telehealth: Payer: Self-pay

## 2018-05-16 NOTE — Telephone Encounter (Signed)
[name] called and left a message stating her mom was admitted to the hospital. They did check her INR and it was 7.0.

## 2018-05-17 ENCOUNTER — Ambulatory Visit: Payer: Medicare Other | Admitting: Family Medicine

## 2018-05-17 MED ORDER — NITROGLYCERIN 0.4 MG SL SUBL
0.40 | SUBLINGUAL_TABLET | SUBLINGUAL | Status: DC
Start: ? — End: 2018-05-17

## 2018-05-17 MED ORDER — GENERIC EXTERNAL MEDICATION
Status: DC
Start: ? — End: 2018-05-17

## 2018-05-17 MED ORDER — SODIUM CHLORIDE 0.9 % IV SOLN
10.00 | INTRAVENOUS | Status: DC
Start: ? — End: 2018-05-17

## 2018-05-17 MED ORDER — GENERIC EXTERNAL MEDICATION
1.00 | Status: DC
Start: 2018-05-19 — End: 2018-05-17

## 2018-05-17 MED ORDER — DOXYCYCLINE MONOHYDRATE 100 MG PO CAPS
100.00 | ORAL_CAPSULE | ORAL | Status: DC
Start: 2018-05-18 — End: 2018-05-17

## 2018-05-17 MED ORDER — ALBUTEROL SULFATE (2.5 MG/3ML) 0.083% IN NEBU
2.50 | INHALATION_SOLUTION | RESPIRATORY_TRACT | Status: DC
Start: ? — End: 2018-05-17

## 2018-05-17 MED ORDER — CLONIDINE HCL 0.1 MG PO TABS
.10 | ORAL_TABLET | ORAL | Status: DC
Start: ? — End: 2018-05-17

## 2018-05-17 MED ORDER — BENZONATATE 100 MG PO CAPS
100.00 | ORAL_CAPSULE | ORAL | Status: DC
Start: ? — End: 2018-05-17

## 2018-05-17 MED ORDER — GUAIFENESIN 400 MG PO TABS
400.00 | ORAL_TABLET | ORAL | Status: DC
Start: 2018-05-18 — End: 2018-05-17

## 2018-05-17 MED ORDER — POLYETHYLENE GLYCOL 3350 17 G PO PACK
17.00 | PACK | ORAL | Status: DC
Start: ? — End: 2018-05-17

## 2018-05-17 MED ORDER — ACETAMINOPHEN 325 MG PO TABS
650.00 | ORAL_TABLET | ORAL | Status: DC
Start: ? — End: 2018-05-17

## 2018-05-17 MED ORDER — SODIUM CHLORIDE 0.9 % IV SOLN
75.00 | INTRAVENOUS | Status: DC
Start: ? — End: 2018-05-17

## 2018-05-18 MED ORDER — ASPIRIN EC 81 MG PO TBEC
81.00 | DELAYED_RELEASE_TABLET | ORAL | Status: DC
Start: 2018-05-19 — End: 2018-05-18

## 2018-05-18 MED ORDER — GENERIC EXTERNAL MEDICATION
Status: DC
Start: ? — End: 2018-05-18

## 2018-05-18 MED ORDER — GABAPENTIN 100 MG PO CAPS
200.00 | ORAL_CAPSULE | ORAL | Status: DC
Start: 2018-05-18 — End: 2018-05-18

## 2018-05-18 MED ORDER — NITROGLYCERIN 0.2 MG/HR TD PT24
1.00 | MEDICATED_PATCH | TRANSDERMAL | Status: DC
Start: 2018-05-19 — End: 2018-05-18

## 2018-05-18 NOTE — Telephone Encounter (Signed)
Called and LMOM for sherry.

## 2018-05-26 ENCOUNTER — Telehealth: Payer: Self-pay | Admitting: *Deleted

## 2018-05-26 ENCOUNTER — Telehealth: Payer: Self-pay

## 2018-05-26 DIAGNOSIS — S82001D Unspecified fracture of right patella, subsequent encounter for closed fracture with routine healing: Secondary | ICD-10-CM | POA: Diagnosis not present

## 2018-05-26 DIAGNOSIS — S82221D Displaced transverse fracture of shaft of right tibia, subsequent encounter for closed fracture with routine healing: Secondary | ICD-10-CM | POA: Diagnosis not present

## 2018-05-26 DIAGNOSIS — E1122 Type 2 diabetes mellitus with diabetic chronic kidney disease: Secondary | ICD-10-CM | POA: Diagnosis not present

## 2018-05-26 DIAGNOSIS — I5032 Chronic diastolic (congestive) heart failure: Secondary | ICD-10-CM | POA: Diagnosis not present

## 2018-05-26 DIAGNOSIS — S82101D Unspecified fracture of upper end of right tibia, subsequent encounter for closed fracture with routine healing: Secondary | ICD-10-CM | POA: Diagnosis not present

## 2018-05-26 DIAGNOSIS — S82421D Displaced transverse fracture of shaft of right fibula, subsequent encounter for closed fracture with routine healing: Secondary | ICD-10-CM | POA: Diagnosis not present

## 2018-05-26 DIAGNOSIS — I480 Paroxysmal atrial fibrillation: Secondary | ICD-10-CM | POA: Diagnosis not present

## 2018-05-26 DIAGNOSIS — I130 Hypertensive heart and chronic kidney disease with heart failure and stage 1 through stage 4 chronic kidney disease, or unspecified chronic kidney disease: Secondary | ICD-10-CM | POA: Diagnosis not present

## 2018-05-26 DIAGNOSIS — M150 Primary generalized (osteo)arthritis: Secondary | ICD-10-CM | POA: Diagnosis not present

## 2018-05-26 DIAGNOSIS — E1140 Type 2 diabetes mellitus with diabetic neuropathy, unspecified: Secondary | ICD-10-CM | POA: Diagnosis not present

## 2018-05-26 DIAGNOSIS — M810 Age-related osteoporosis without current pathological fracture: Secondary | ICD-10-CM | POA: Diagnosis not present

## 2018-05-26 DIAGNOSIS — I2510 Atherosclerotic heart disease of native coronary artery without angina pectoris: Secondary | ICD-10-CM | POA: Diagnosis not present

## 2018-05-26 DIAGNOSIS — N183 Chronic kidney disease, stage 3 (moderate): Secondary | ICD-10-CM | POA: Diagnosis not present

## 2018-05-26 NOTE — Telephone Encounter (Signed)
Spoke w/Brad and informed him that Ms. Saintil's daughters were inquiring about getting her in with palliative care.   He asked for her daughters phone number to reach out to her to discuss this w/her.   Their fax # is [phone] for pt's OV notes.Elouise Munroe, [place]

## 2018-05-26 NOTE — Telephone Encounter (Signed)
[name], pt's daughter, called stating that pt was discharged from the hospital 05-25-18 for pneumonia and flu and needed a hospital follow up within 1-2 weeks.  [name] would be the one to bring her, and she can only come on Wednesday or Friday. The only slot available within the next 2 weeks is a 2:20 slot on 06-09-18, and the appt prior to that is a 40 minute physical with pap.  Although it is usually not preferred, is it okay to have the hospital follow up following a 40 minute physical? The only other option to schedule pt in 2 week window of follow up is the same day, 06-09-18, at 1:00 (first slot of that day).  I have added pt to the 2:20 slot, can you please review and let me know if this is okay or if I need to call pt's daughter and change.   Thanks!

## 2018-05-26 NOTE — Telephone Encounter (Signed)
Lauri the [facility] nurse for Ms. Longo called and stated that pt's daughter said that she has been having constant diarrhea since she got home yesterday from hospital and she was calling to make Dr. Madilyn Fireman aware. She said that she has not noticed this however, she said that she would report this to [place]. Also, she stated that they are interested in getting Ms. Oleson into pallitive care w/[facility]. Their phone # is: [phone]. [name], [name]

## 2018-05-26 NOTE — Telephone Encounter (Signed)
Spoke w/Tabitha @ [facility] and she stated that she will have someone from their marketing team to call back with the information.[name], [place]

## 2018-05-28 NOTE — Telephone Encounter (Signed)
That is perfectly fine.  Thank you!

## 2018-05-29 ENCOUNTER — Telehealth: Payer: Self-pay

## 2018-05-29 DIAGNOSIS — I480 Paroxysmal atrial fibrillation: Secondary | ICD-10-CM | POA: Diagnosis not present

## 2018-05-29 DIAGNOSIS — I130 Hypertensive heart and chronic kidney disease with heart failure and stage 1 through stage 4 chronic kidney disease, or unspecified chronic kidney disease: Secondary | ICD-10-CM | POA: Diagnosis not present

## 2018-05-29 DIAGNOSIS — S82101D Unspecified fracture of upper end of right tibia, subsequent encounter for closed fracture with routine healing: Secondary | ICD-10-CM | POA: Diagnosis not present

## 2018-05-29 DIAGNOSIS — I5032 Chronic diastolic (congestive) heart failure: Secondary | ICD-10-CM | POA: Diagnosis not present

## 2018-05-29 DIAGNOSIS — S82421D Displaced transverse fracture of shaft of right fibula, subsequent encounter for closed fracture with routine healing: Secondary | ICD-10-CM | POA: Diagnosis not present

## 2018-05-29 DIAGNOSIS — S80812D Abrasion, left lower leg, subsequent encounter: Secondary | ICD-10-CM | POA: Diagnosis not present

## 2018-05-29 DIAGNOSIS — M150 Primary generalized (osteo)arthritis: Secondary | ICD-10-CM | POA: Diagnosis not present

## 2018-05-29 DIAGNOSIS — S82001D Unspecified fracture of right patella, subsequent encounter for closed fracture with routine healing: Secondary | ICD-10-CM | POA: Diagnosis not present

## 2018-05-29 DIAGNOSIS — L89321 Pressure ulcer of left buttock, stage 1: Secondary | ICD-10-CM | POA: Diagnosis not present

## 2018-05-29 DIAGNOSIS — M810 Age-related osteoporosis without current pathological fracture: Secondary | ICD-10-CM | POA: Diagnosis not present

## 2018-05-29 DIAGNOSIS — L89311 Pressure ulcer of right buttock, stage 1: Secondary | ICD-10-CM | POA: Diagnosis not present

## 2018-05-29 DIAGNOSIS — S82221D Displaced transverse fracture of shaft of right tibia, subsequent encounter for closed fracture with routine healing: Secondary | ICD-10-CM | POA: Diagnosis not present

## 2018-05-29 NOTE — Telephone Encounter (Signed)
Her daughter called back and states [name] is refusing to go to the ED. They wanted to know what they can do at home to help hydrate her. They are giving her Gatorade. Dr Madilyn Fireman advised to continue the Gatorade and to check blood pressure a few times a day.

## 2018-05-29 NOTE — Telephone Encounter (Signed)
Margarita Grizzle from home health called and reports [name] has diarrhea, is not looking well and her blood pressure is 88/50. I advised her to send her to the ED. She thinks she has failure to thrive.

## 2018-05-30 DIAGNOSIS — S82221D Displaced transverse fracture of shaft of right tibia, subsequent encounter for closed fracture with routine healing: Secondary | ICD-10-CM | POA: Diagnosis not present

## 2018-05-30 DIAGNOSIS — I130 Hypertensive heart and chronic kidney disease with heart failure and stage 1 through stage 4 chronic kidney disease, or unspecified chronic kidney disease: Secondary | ICD-10-CM | POA: Diagnosis not present

## 2018-05-30 DIAGNOSIS — S82101D Unspecified fracture of upper end of right tibia, subsequent encounter for closed fracture with routine healing: Secondary | ICD-10-CM | POA: Diagnosis not present

## 2018-05-30 DIAGNOSIS — S82001D Unspecified fracture of right patella, subsequent encounter for closed fracture with routine healing: Secondary | ICD-10-CM | POA: Diagnosis not present

## 2018-05-30 DIAGNOSIS — M810 Age-related osteoporosis without current pathological fracture: Secondary | ICD-10-CM | POA: Diagnosis not present

## 2018-05-30 DIAGNOSIS — I480 Paroxysmal atrial fibrillation: Secondary | ICD-10-CM | POA: Diagnosis not present

## 2018-05-30 DIAGNOSIS — I5032 Chronic diastolic (congestive) heart failure: Secondary | ICD-10-CM | POA: Diagnosis not present

## 2018-05-30 DIAGNOSIS — L89321 Pressure ulcer of left buttock, stage 1: Secondary | ICD-10-CM | POA: Diagnosis not present

## 2018-05-30 DIAGNOSIS — M150 Primary generalized (osteo)arthritis: Secondary | ICD-10-CM | POA: Diagnosis not present

## 2018-05-30 DIAGNOSIS — L89311 Pressure ulcer of right buttock, stage 1: Secondary | ICD-10-CM | POA: Diagnosis not present

## 2018-05-30 DIAGNOSIS — S82421D Displaced transverse fracture of shaft of right fibula, subsequent encounter for closed fracture with routine healing: Secondary | ICD-10-CM | POA: Diagnosis not present

## 2018-05-30 DIAGNOSIS — S80812D Abrasion, left lower leg, subsequent encounter: Secondary | ICD-10-CM | POA: Diagnosis not present

## 2018-05-31 DIAGNOSIS — S82101D Unspecified fracture of upper end of right tibia, subsequent encounter for closed fracture with routine healing: Secondary | ICD-10-CM | POA: Diagnosis not present

## 2018-05-31 DIAGNOSIS — I130 Hypertensive heart and chronic kidney disease with heart failure and stage 1 through stage 4 chronic kidney disease, or unspecified chronic kidney disease: Secondary | ICD-10-CM | POA: Diagnosis not present

## 2018-05-31 DIAGNOSIS — L89321 Pressure ulcer of left buttock, stage 1: Secondary | ICD-10-CM | POA: Diagnosis not present

## 2018-05-31 DIAGNOSIS — I480 Paroxysmal atrial fibrillation: Secondary | ICD-10-CM | POA: Diagnosis not present

## 2018-05-31 DIAGNOSIS — M810 Age-related osteoporosis without current pathological fracture: Secondary | ICD-10-CM | POA: Diagnosis not present

## 2018-05-31 DIAGNOSIS — L89311 Pressure ulcer of right buttock, stage 1: Secondary | ICD-10-CM | POA: Diagnosis not present

## 2018-05-31 DIAGNOSIS — M150 Primary generalized (osteo)arthritis: Secondary | ICD-10-CM | POA: Diagnosis not present

## 2018-05-31 DIAGNOSIS — S82001D Unspecified fracture of right patella, subsequent encounter for closed fracture with routine healing: Secondary | ICD-10-CM | POA: Diagnosis not present

## 2018-05-31 DIAGNOSIS — S80812D Abrasion, left lower leg, subsequent encounter: Secondary | ICD-10-CM | POA: Diagnosis not present

## 2018-05-31 DIAGNOSIS — I5032 Chronic diastolic (congestive) heart failure: Secondary | ICD-10-CM | POA: Diagnosis not present

## 2018-05-31 DIAGNOSIS — S82221D Displaced transverse fracture of shaft of right tibia, subsequent encounter for closed fracture with routine healing: Secondary | ICD-10-CM | POA: Diagnosis not present

## 2018-05-31 DIAGNOSIS — S82421D Displaced transverse fracture of shaft of right fibula, subsequent encounter for closed fracture with routine healing: Secondary | ICD-10-CM | POA: Diagnosis not present

## 2018-05-31 NOTE — Telephone Encounter (Signed)
Please call her and see how she is doing?

## 2018-06-01 ENCOUNTER — Telehealth: Payer: Self-pay | Admitting: *Deleted

## 2018-06-01 DIAGNOSIS — I130 Hypertensive heart and chronic kidney disease with heart failure and stage 1 through stage 4 chronic kidney disease, or unspecified chronic kidney disease: Secondary | ICD-10-CM | POA: Diagnosis not present

## 2018-06-01 DIAGNOSIS — S82001D Unspecified fracture of right patella, subsequent encounter for closed fracture with routine healing: Secondary | ICD-10-CM | POA: Diagnosis not present

## 2018-06-01 DIAGNOSIS — I480 Paroxysmal atrial fibrillation: Secondary | ICD-10-CM | POA: Diagnosis not present

## 2018-06-01 DIAGNOSIS — M150 Primary generalized (osteo)arthritis: Secondary | ICD-10-CM | POA: Diagnosis not present

## 2018-06-01 DIAGNOSIS — G8929 Other chronic pain: Secondary | ICD-10-CM | POA: Diagnosis not present

## 2018-06-01 DIAGNOSIS — I5032 Chronic diastolic (congestive) heart failure: Secondary | ICD-10-CM | POA: Diagnosis not present

## 2018-06-01 DIAGNOSIS — L89321 Pressure ulcer of left buttock, stage 1: Secondary | ICD-10-CM | POA: Diagnosis not present

## 2018-06-01 DIAGNOSIS — S82221D Displaced transverse fracture of shaft of right tibia, subsequent encounter for closed fracture with routine healing: Secondary | ICD-10-CM | POA: Diagnosis not present

## 2018-06-01 DIAGNOSIS — S82101D Unspecified fracture of upper end of right tibia, subsequent encounter for closed fracture with routine healing: Secondary | ICD-10-CM | POA: Diagnosis not present

## 2018-06-01 DIAGNOSIS — L89311 Pressure ulcer of right buttock, stage 1: Secondary | ICD-10-CM | POA: Diagnosis not present

## 2018-06-01 DIAGNOSIS — S82421D Displaced transverse fracture of shaft of right fibula, subsequent encounter for closed fracture with routine healing: Secondary | ICD-10-CM | POA: Diagnosis not present

## 2018-06-01 DIAGNOSIS — S80812D Abrasion, left lower leg, subsequent encounter: Secondary | ICD-10-CM | POA: Diagnosis not present

## 2018-06-01 DIAGNOSIS — R6251 Failure to thrive (child): Secondary | ICD-10-CM

## 2018-06-01 DIAGNOSIS — M810 Age-related osteoporosis without current pathological fracture: Secondary | ICD-10-CM | POA: Diagnosis not present

## 2018-06-01 MED ORDER — ONDANSETRON 4 MG PO TBDP: 4.0000 mg | ORAL_TABLET | Freq: Three times a day (TID) | ORAL | 0 refills | Status: AC | PRN

## 2018-06-01 NOTE — Telephone Encounter (Signed)
Patient's daughter advised. She will talk with Dr Madilyn Fireman tomorrow during the appointment.

## 2018-06-01 NOTE — Telephone Encounter (Signed)
Called pt's daughter to make sure that she understood that her mother's appt was for next Friday 06/09/18 @ 2 PM and NOT tomorrow 06/02/18. She stated that she thought she was scheduled for tomorrow @ 2 PM. I advised her that Dr. Madilyn Fireman would be willing to see her tomorrow @ 1130 if she wanted to bring her in?  I asked her if her mother was in the same condition that she was in when she came into the clinic 2 weeks ago, or has she improved? She stated that her mother is no better and she really doesn't feel that she can transport her in the condition that she's in, she is weak and talking all night. She said that she understands when you are speaking to her but she just chatters all night long.  I asked her what her goals where with her mother? Did she want to keep her home and comfortable? Did she want Hospice? I told her that since her mother is in this condition it would be best if Dr. Madilyn Fireman called her if that would be ok with her to discuss this with her. She stated that this would be fine. I told her that she would be available to speak with her before 8 AM tomorrow. She said she would be willing to speak with her anytime she called.   Will fwd to pcp for f/u.[name] [place]

## 2018-06-01 NOTE — Telephone Encounter (Signed)
The home health nurse states she did better yesterday and her blood pressure was 120/55. I called and spoke with her daughter and she states she had a better day yesterday. However, last night she stayed a wake talking to people that were not there and discussing activities that wasn't taking place. Even after taking 2 tylenol PM.

## 2018-06-01 NOTE — Telephone Encounter (Signed)
Please aks her daughter if Palliative care has been able to do a consult.  I really think we are at the point where we need to start considering goals for end of life.

## 2018-06-01 NOTE — Telephone Encounter (Signed)
Physical therapist from [facility] is requesting VO to continue home pt twice a week for 8 weeks.  She stated that the pt has not eaten anything in a few days but was asking for food today.  When she did eat she was unable to keep anything down.  Does she possibly need an anti nausea med?

## 2018-06-01 NOTE — Telephone Encounter (Signed)
OK, nausea med sent to [facility].

## 2018-06-02 ENCOUNTER — Telehealth: Payer: Self-pay

## 2018-06-02 DIAGNOSIS — I480 Paroxysmal atrial fibrillation: Secondary | ICD-10-CM | POA: Diagnosis not present

## 2018-06-02 DIAGNOSIS — S82001D Unspecified fracture of right patella, subsequent encounter for closed fracture with routine healing: Secondary | ICD-10-CM | POA: Diagnosis not present

## 2018-06-02 DIAGNOSIS — I130 Hypertensive heart and chronic kidney disease with heart failure and stage 1 through stage 4 chronic kidney disease, or unspecified chronic kidney disease: Secondary | ICD-10-CM | POA: Diagnosis not present

## 2018-06-02 DIAGNOSIS — M150 Primary generalized (osteo)arthritis: Secondary | ICD-10-CM | POA: Diagnosis not present

## 2018-06-02 DIAGNOSIS — L89321 Pressure ulcer of left buttock, stage 1: Secondary | ICD-10-CM | POA: Diagnosis not present

## 2018-06-02 DIAGNOSIS — S82221D Displaced transverse fracture of shaft of right tibia, subsequent encounter for closed fracture with routine healing: Secondary | ICD-10-CM | POA: Diagnosis not present

## 2018-06-02 DIAGNOSIS — M810 Age-related osteoporosis without current pathological fracture: Secondary | ICD-10-CM | POA: Diagnosis not present

## 2018-06-02 DIAGNOSIS — S82101D Unspecified fracture of upper end of right tibia, subsequent encounter for closed fracture with routine healing: Secondary | ICD-10-CM | POA: Diagnosis not present

## 2018-06-02 DIAGNOSIS — L89311 Pressure ulcer of right buttock, stage 1: Secondary | ICD-10-CM | POA: Diagnosis not present

## 2018-06-02 DIAGNOSIS — I5032 Chronic diastolic (congestive) heart failure: Secondary | ICD-10-CM | POA: Diagnosis not present

## 2018-06-02 DIAGNOSIS — S82421D Displaced transverse fracture of shaft of right fibula, subsequent encounter for closed fracture with routine healing: Secondary | ICD-10-CM | POA: Diagnosis not present

## 2018-06-02 DIAGNOSIS — S80812D Abrasion, left lower leg, subsequent encounter: Secondary | ICD-10-CM | POA: Diagnosis not present

## 2018-06-02 NOTE — Telephone Encounter (Signed)
[facility] in [place], [place]  Address: [address] [place], [place] [address] Phone: [phone] Fax: [phone].  Hospice referral sent.

## 2018-06-02 NOTE — Telephone Encounter (Signed)
Home health advised.  ?

## 2018-06-02 NOTE — Telephone Encounter (Signed)
Called and spoke with [name] her daughter.  We had a long discussion this morning about whether or not to intervene and be more aggressive with her mother's health care and send her to the hospital today because she is not speaking words she is just making stuttering noises.  She is really been declining significantly over the last 2 years but more so in the last 6 months.  Versus putting her on hospice which I think is completely appropriate at this time and encouraged her to strongly consider.  I really think at this point she should be comfort care.  Even the patient herself has said that she is ready to go home and wants to die.  [name] will speak with her other sister just to make sure but she at this point feels like hospice is also appropriate we will go ahead and place referral and get this done urgently.

## 2018-06-05 ENCOUNTER — Telehealth: Payer: Self-pay

## 2018-06-05 ENCOUNTER — Other Ambulatory Visit: Payer: Self-pay | Admitting: Family Medicine

## 2018-06-05 NOTE — Telephone Encounter (Signed)
OK to hold the Lantus if she is not eating. If she is still eating some then decrease Lantus to 15 units once a day.

## 2018-06-05 NOTE — Telephone Encounter (Signed)
A representative (Patty Renato Battles) from [facility] came by the office to inform Dr. Madilyn Fireman that patient was admitted to hospice on 06/03/2018.

## 2018-06-05 NOTE — Telephone Encounter (Signed)
Patient's daughter advised.  

## 2018-06-05 NOTE — Telephone Encounter (Signed)
I called [name] and her mom was jerking violently for two hours. Hospice came by and gave a prescription of Lorazepam. The prescription did help later when the jerking started again.    She states her mom's blood sugar last night was low and again this morning it was 50 mg/dl. Patient is not longer taking the Humulin. She does take Lantus 20 am and 14 pm. [name] dropped it down to 12 units last night and hasn't gave her any this morning.

## 2018-06-06 ENCOUNTER — Telehealth: Payer: Self-pay

## 2018-06-06 NOTE — Telephone Encounter (Signed)
Hospice nurse with [facility] called for orders for wound care with Xeroform Petrolatum Dressing  and glucose check BID. Verbal given.

## 2018-06-06 NOTE — Telephone Encounter (Signed)
Agree with documentation as above.   Eneida Evers, MD  

## 2018-06-09 ENCOUNTER — Telehealth: Payer: Self-pay

## 2018-06-09 ENCOUNTER — Inpatient Hospital Stay: Payer: Medicare Other | Admitting: Family Medicine

## 2018-06-09 MED ORDER — LORAZEPAM 0.5 MG PO TABS: 0.5000 mg | ORAL_TABLET | ORAL | 1 refills | Status: AC | PRN

## 2018-06-09 NOTE — Telephone Encounter (Signed)
Patient's daughter advised.  

## 2018-06-09 NOTE — Telephone Encounter (Signed)
Okay to give Lorazepam 0.5 mg every 8 hours as needed.

## 2018-06-09 NOTE — Telephone Encounter (Signed)
[place]/Triage nurse with [facility] called for orders to treat chest wall pain and anxiety. She states she is anxious and having typical chest wall pain. Please advise.

## 2018-06-09 NOTE — Telephone Encounter (Signed)
Rx sent. Just let SHerri know to go and pick up.

## 2018-06-12 ENCOUNTER — Telehealth: Payer: Self-pay | Admitting: *Deleted

## 2018-06-12 NOTE — Telephone Encounter (Signed)
fmla forms completed,copied,scanned,faxed,confirmation received. Copy placed up front for pt's daughter.[name], [place]

## 2018-06-13 ENCOUNTER — Other Ambulatory Visit: Payer: Self-pay | Admitting: *Deleted

## 2018-06-13 MED ORDER — PRAVASTATIN SODIUM 40 MG PO TABS: 40.0000 mg | ORAL_TABLET | Freq: Every day | ORAL | 3 refills | Status: DC

## 2018-06-14 ENCOUNTER — Telehealth: Payer: Self-pay

## 2018-06-14 MED ORDER — CIPROFLOXACIN HCL 250 MG PO TABS: 250.0000 mg | ORAL_TABLET | Freq: Two times a day (BID) | ORAL | 0 refills | Status: AC

## 2018-06-14 NOTE — Telephone Encounter (Signed)
Lisa advised

## 2018-06-14 NOTE — Telephone Encounter (Signed)
I will just treat instead of doing a urine and then waiting for the culture to come back.  He also does have some bacteriuria.

## 2018-06-14 NOTE — Telephone Encounter (Signed)
Lattie Haw from [facility] called stating pt's daughters are concerned because patient hs been having foul smelling urine for about 3 to 4 days and she has also had some dizziness.   Pt has a HX of UTI and they were wanting orders to test urine.  OK for orders?

## 2018-06-19 ENCOUNTER — Telehealth: Payer: Self-pay

## 2018-06-19 MED ORDER — FLUCONAZOLE 150 MG PO TABS: 150.0000 mg | ORAL_TABLET | Freq: Every day | ORAL | 0 refills | Status: DC

## 2018-06-19 NOTE — Telephone Encounter (Signed)
Received note from after hours care stating that Hospice called advising that pt was agitated and having a hard time waking up.   I returned call to [facility] with [facility] and she states that pt was given another RX for Cipro by on-call provider and they are waiting to receive culture results back today.   They will call with culture results if it needs to be reviewed by PCP.  No further needs at this time.

## 2018-06-19 NOTE — Telephone Encounter (Signed)
Per Dr Madilyn Fireman- Culture showed yeast.   Instructed to send Duflucan 150mg , 1 tab QD x3 days to pharmacy   [name] advised

## 2018-06-23 ENCOUNTER — Telehealth: Payer: Self-pay

## 2018-06-23 DIAGNOSIS — G301 Alzheimer's disease with late onset: Secondary | ICD-10-CM | POA: Diagnosis not present

## 2018-06-23 DIAGNOSIS — I5033 Acute on chronic diastolic (congestive) heart failure: Secondary | ICD-10-CM | POA: Diagnosis not present

## 2018-06-23 NOTE — Telephone Encounter (Signed)
LMOM for Lattie Haw ok for Haldol. Left cell phone.

## 2018-06-23 NOTE — Telephone Encounter (Signed)
Lattie Haw from [facility] calls stating family reports pt is agitated, restless, and hallucinating.   Requesting an RX for Haldol.   Please advise

## 2018-06-25 ENCOUNTER — Telehealth: Payer: Self-pay | Admitting: Family Medicine

## 2018-06-25 NOTE — Telephone Encounter (Signed)
Hospice called about [name]'s lantus dose. [name] is actively dying and is no longer eating and drinking. He had a few episodes of hypoglycemia. Plan to discontinue insulin.  Will forward to PCP

## 2018-06-26 ENCOUNTER — Telehealth: Payer: Self-pay | Admitting: Family Medicine

## 2018-06-26 MED ORDER — PENICILLIN V POTASSIUM 250 MG PO TABS: 250.0000 mg | ORAL_TABLET | Freq: Two times a day (BID) | ORAL | 0 refills | Status: DC

## 2018-06-26 NOTE — Telephone Encounter (Signed)
Called [facility] at [phone]) and was advised best number to send fax to was [phone].  Fax sent and confirmation received.

## 2018-06-26 NOTE — Telephone Encounter (Signed)
Called daughter [name]. Will discontinue several medications. She is barely eating or drinking.  Will fax over updated list to hospice.

## 2018-06-26 NOTE — Telephone Encounter (Signed)
I spoke with her on June 28 that evening.  Gave a verbal order for Haldol 1 mg every 6 hours as needed for agitation restlessness and pain.

## 2018-06-30 ENCOUNTER — Other Ambulatory Visit: Payer: Self-pay | Admitting: Family Medicine

## 2018-07-05 ENCOUNTER — Encounter

## 2018-07-05 ENCOUNTER — Encounter: Payer: Medicare Other | Admitting: Cardiovascular Disease

## 2018-07-06 ENCOUNTER — Telehealth: Payer: Self-pay

## 2018-07-06 NOTE — Telephone Encounter (Signed)
[name] called and states her mom passed away on the 5 th of August 11, 2023.

## 2018-07-11 NOTE — Telephone Encounter (Signed)
LMOM with condolences.

## 2018-07-27 NOTE — Progress Notes (Signed)
Called spoke to the nurse.  They are now that I can start come seeing her daily.  They do feel like she is transitioning.  She is sleeping 23 hours a day and is only minimally verbal.  She is not really eating.  She is at peace with passing away.  Also spoke to [name]'s daughter [name] and just encouraged her to call if she needed anything.

## 2018-07-27 DEATH — deceased
